# Patient Record
Sex: Female | Born: 1945
Health system: Southern US, Community
[De-identification: ages and names within clinical notes are randomized; demographics above are authoritative.]

## PROBLEM LIST (undated history)

## (undated) DIAGNOSIS — I10 Essential (primary) hypertension: Secondary | ICD-10-CM

## (undated) DIAGNOSIS — M542 Cervicalgia: Secondary | ICD-10-CM

## (undated) DIAGNOSIS — M1990 Unspecified osteoarthritis, unspecified site: Secondary | ICD-10-CM

## (undated) DIAGNOSIS — F419 Anxiety disorder, unspecified: Secondary | ICD-10-CM

## (undated) DIAGNOSIS — K5790 Diverticulosis of intestine, part unspecified, without perforation or abscess without bleeding: Secondary | ICD-10-CM

## (undated) DIAGNOSIS — R51 Headache: Secondary | ICD-10-CM

## (undated) DIAGNOSIS — I6529 Occlusion and stenosis of unspecified carotid artery: Secondary | ICD-10-CM

## (undated) DIAGNOSIS — R519 Headache, unspecified: Secondary | ICD-10-CM

## (undated) DIAGNOSIS — M5090 Cervical disc disorder, unspecified, unspecified cervical region: Secondary | ICD-10-CM

## (undated) DIAGNOSIS — F32A Depression, unspecified: Secondary | ICD-10-CM

## (undated) DIAGNOSIS — K5792 Diverticulitis of intestine, part unspecified, without perforation or abscess without bleeding: Secondary | ICD-10-CM

## (undated) DIAGNOSIS — E039 Hypothyroidism, unspecified: Secondary | ICD-10-CM

## (undated) DIAGNOSIS — R7302 Impaired glucose tolerance (oral): Secondary | ICD-10-CM

## (undated) DIAGNOSIS — R7303 Prediabetes: Secondary | ICD-10-CM

## (undated) DIAGNOSIS — L409 Psoriasis, unspecified: Secondary | ICD-10-CM

## (undated) DIAGNOSIS — F329 Major depressive disorder, single episode, unspecified: Secondary | ICD-10-CM

## (undated) DIAGNOSIS — K219 Gastro-esophageal reflux disease without esophagitis: Secondary | ICD-10-CM

## (undated) HISTORY — PX: CHOLECYSTECTOMY: SHX55

## (undated) HISTORY — DX: Occlusion and stenosis of unspecified carotid artery: I65.29

## (undated) HISTORY — DX: Diverticulosis of intestine, part unspecified, without perforation or abscess without bleeding: K57.90

## (undated) HISTORY — DX: Headache: R51

## (undated) HISTORY — DX: Hypothyroidism, unspecified: E03.9

## (undated) HISTORY — DX: Cervical disc disorder, unspecified, unspecified cervical region: M50.90

## (undated) HISTORY — PX: COLONOSCOPY: SHX174

## (undated) HISTORY — DX: Headache, unspecified: R51.9

## (undated) HISTORY — PX: UPPER GASTROINTESTINAL ENDOSCOPY: SHX188

## (undated) HISTORY — DX: Diverticulitis of intestine, part unspecified, without perforation or abscess without bleeding: K57.92

## (undated) HISTORY — DX: Anxiety disorder, unspecified: F41.9

## (undated) HISTORY — PX: CERVICAL FUSION: SHX112

## (undated) HISTORY — DX: Cervicalgia: M54.2

## (undated) HISTORY — DX: Psoriasis, unspecified: L40.9

## (undated) HISTORY — DX: Impaired glucose tolerance (oral): R73.02

---

## 1996-05-22 HISTORY — PX: TOTAL ABDOMINAL HYSTERECTOMY: SHX209

## 1998-04-23 ENCOUNTER — Emergency Department (HOSPITAL_COMMUNITY): Admission: EM | Admit: 1998-04-23 | Discharge: 1998-04-23 | Payer: Self-pay | Admitting: Emergency Medicine

## 1999-08-17 ENCOUNTER — Encounter: Payer: Self-pay | Admitting: Obstetrics and Gynecology

## 1999-08-17 ENCOUNTER — Encounter: Admission: RE | Admit: 1999-08-17 | Discharge: 1999-08-17 | Payer: Self-pay | Admitting: Obstetrics and Gynecology

## 1999-10-24 ENCOUNTER — Other Ambulatory Visit: Admission: RE | Admit: 1999-10-24 | Discharge: 1999-10-24 | Payer: Self-pay | Admitting: Obstetrics and Gynecology

## 2000-09-01 ENCOUNTER — Encounter: Payer: Self-pay | Admitting: Emergency Medicine

## 2000-09-01 ENCOUNTER — Emergency Department (HOSPITAL_COMMUNITY): Admission: EM | Admit: 2000-09-01 | Discharge: 2000-09-01 | Payer: Self-pay | Admitting: Emergency Medicine

## 2000-09-04 ENCOUNTER — Encounter (INDEPENDENT_AMBULATORY_CARE_PROVIDER_SITE_OTHER): Payer: Self-pay

## 2000-09-04 ENCOUNTER — Observation Stay (HOSPITAL_COMMUNITY): Admission: RE | Admit: 2000-09-04 | Discharge: 2000-09-04 | Payer: Self-pay | Admitting: General Surgery

## 2000-09-04 ENCOUNTER — Encounter: Payer: Self-pay | Admitting: General Surgery

## 2001-04-21 ENCOUNTER — Emergency Department (HOSPITAL_COMMUNITY): Admission: EM | Admit: 2001-04-21 | Discharge: 2001-04-21 | Payer: Self-pay | Admitting: Emergency Medicine

## 2001-04-22 ENCOUNTER — Encounter: Payer: Self-pay | Admitting: Emergency Medicine

## 2002-01-25 ENCOUNTER — Encounter: Payer: Self-pay | Admitting: Emergency Medicine

## 2002-01-25 ENCOUNTER — Emergency Department (HOSPITAL_COMMUNITY): Admission: EM | Admit: 2002-01-25 | Discharge: 2002-01-25 | Payer: Self-pay | Admitting: Emergency Medicine

## 2002-01-30 ENCOUNTER — Emergency Department (HOSPITAL_COMMUNITY): Admission: EM | Admit: 2002-01-30 | Discharge: 2002-01-30 | Payer: Self-pay | Admitting: Emergency Medicine

## 2002-03-24 ENCOUNTER — Encounter: Admission: RE | Admit: 2002-03-24 | Discharge: 2002-03-24 | Payer: Self-pay | Admitting: Obstetrics and Gynecology

## 2002-03-24 ENCOUNTER — Encounter: Payer: Self-pay | Admitting: Obstetrics and Gynecology

## 2002-08-06 ENCOUNTER — Ambulatory Visit (HOSPITAL_COMMUNITY): Admission: RE | Admit: 2002-08-06 | Discharge: 2002-08-06 | Payer: Self-pay | Admitting: Internal Medicine

## 2003-08-04 ENCOUNTER — Encounter: Admission: RE | Admit: 2003-08-04 | Discharge: 2003-08-04 | Payer: Self-pay | Admitting: Obstetrics and Gynecology

## 2005-01-22 ENCOUNTER — Emergency Department (HOSPITAL_COMMUNITY): Admission: EM | Admit: 2005-01-22 | Discharge: 2005-01-23 | Payer: Self-pay | Admitting: Emergency Medicine

## 2005-03-22 ENCOUNTER — Encounter: Admission: RE | Admit: 2005-03-22 | Discharge: 2005-03-22 | Payer: Self-pay | Admitting: Obstetrics and Gynecology

## 2006-03-27 ENCOUNTER — Encounter: Admission: RE | Admit: 2006-03-27 | Discharge: 2006-03-27 | Payer: Self-pay | Admitting: Obstetrics and Gynecology

## 2006-04-13 ENCOUNTER — Emergency Department (HOSPITAL_COMMUNITY): Admission: EM | Admit: 2006-04-13 | Discharge: 2006-04-13 | Payer: Self-pay | Admitting: Emergency Medicine

## 2006-04-17 ENCOUNTER — Encounter: Admission: RE | Admit: 2006-04-17 | Discharge: 2006-04-17 | Payer: Self-pay | Admitting: Obstetrics and Gynecology

## 2006-10-19 ENCOUNTER — Encounter: Admission: RE | Admit: 2006-10-19 | Discharge: 2006-10-19 | Payer: Self-pay | Admitting: Obstetrics and Gynecology

## 2007-04-22 ENCOUNTER — Encounter: Admission: RE | Admit: 2007-04-22 | Discharge: 2007-04-22 | Payer: Self-pay | Admitting: Obstetrics and Gynecology

## 2007-11-21 ENCOUNTER — Other Ambulatory Visit: Admission: RE | Admit: 2007-11-21 | Discharge: 2007-11-21 | Payer: Self-pay | Admitting: Obstetrics & Gynecology

## 2007-11-26 ENCOUNTER — Encounter: Admission: RE | Admit: 2007-11-26 | Discharge: 2007-11-26 | Payer: Self-pay | Admitting: Obstetrics and Gynecology

## 2008-05-18 ENCOUNTER — Encounter: Admission: RE | Admit: 2008-05-18 | Discharge: 2008-05-18 | Payer: Self-pay | Admitting: Obstetrics and Gynecology

## 2008-12-08 ENCOUNTER — Encounter: Admission: RE | Admit: 2008-12-08 | Discharge: 2008-12-08 | Payer: Self-pay | Admitting: Obstetrics and Gynecology

## 2009-05-22 HISTORY — PX: BREAST BIOPSY: SHX20

## 2009-07-29 ENCOUNTER — Encounter (INDEPENDENT_AMBULATORY_CARE_PROVIDER_SITE_OTHER): Payer: Self-pay | Admitting: *Deleted

## 2009-08-10 ENCOUNTER — Encounter (INDEPENDENT_AMBULATORY_CARE_PROVIDER_SITE_OTHER): Payer: Self-pay | Admitting: *Deleted

## 2009-08-11 ENCOUNTER — Ambulatory Visit: Payer: Self-pay | Admitting: Internal Medicine

## 2009-08-20 ENCOUNTER — Ambulatory Visit: Payer: Self-pay | Admitting: Internal Medicine

## 2009-10-07 ENCOUNTER — Emergency Department (HOSPITAL_COMMUNITY): Admission: EM | Admit: 2009-10-07 | Discharge: 2009-10-07 | Payer: Self-pay | Admitting: Family Medicine

## 2009-12-15 ENCOUNTER — Encounter: Admission: RE | Admit: 2009-12-15 | Discharge: 2009-12-15 | Payer: Self-pay | Admitting: Obstetrics and Gynecology

## 2009-12-20 ENCOUNTER — Encounter: Admission: RE | Admit: 2009-12-20 | Discharge: 2009-12-20 | Payer: Self-pay | Admitting: Obstetrics and Gynecology

## 2010-06-12 ENCOUNTER — Encounter: Payer: Self-pay | Admitting: Obstetrics and Gynecology

## 2010-06-23 NOTE — Procedures (Signed)
Summary: Colonoscopy  Patient: Lauren [name] Note: All result statuses are Final unless otherwise noted.  Tests: (1) Colonoscopy (COL)   COL Colonoscopy           DONE     [facility]     [address]     [place], [place]  [address]           COLONOSCOPY PROCEDURE REPORT           PATIENT:  [name], [name]  MR#:  [id number]     BIRTHDATE:  1945/08/28, 63 yrs. old  GENDER:  female     ENDOSCOPIST:  Hedwig Morton. Juanda Chance, MD     REF. BY:  Meredeth Ide, M.D.     PROCEDURE DATE:  08/20/2009     PROCEDURE:  Colonoscopy 29562     ASA CLASS:  Class I     INDICATIONS:  father with colon polyps, prior colon 2001 and 2004,     diverticulosis     MEDICATIONS:   Versed 10 mg, Fentanyl 50 mcg           DESCRIPTION OF PROCEDURE:   After the risks benefits and     alternatives of the procedure were thoroughly explained, informed     consent was obtained.  Digital rectal exam was performed and     revealed no rectal masses.   The LB PCF-H180AL [id number] endoscope     was introduced through the anus and advanced to the cecum, which     was identified by both the appendix and ileocecal valve, without     limitations.  The quality of the prep was good, using MoviPrep.     The instrument was then slowly withdrawn as the colon was fully     examined.     <<PROCEDUREIMAGES>>           FINDINGS:  Severe diverticulosis was found (see image6, image7,     and image3). narrow spastic sigmoid colon with many diverticuli,     had to switch to a pediatric scope  This was otherwise a normal     examination of the colon (see image2, image1, image5, image4, and     image8).   Retroflexed views in the rectum revealed no     abnormalities.    The scope was then withdrawn from the patient     and the procedure completed.           COMPLICATIONS:  None     ENDOSCOPIC IMPRESSION:     1) Severe diverticulosis     2) Otherwise normal examination     IV infiltrated, total sedation given Versed 17mg  and  fentanyl     125 ug     RECOMMENDATIONS:     1) high fiber diet     fiber supplement daily     REPEAT EXAM:  In 10 year(s) for.           ______________________________     Hedwig Morton. Juanda Chance, MD           CC:           n.     eSIGNED:   Hedwig Morton. Rohin Krejci at 08/20/2009 04:47 PM           Isidor Holts, [id number]  Note: An exclamation mark (!) indicates a result that was not dispersed into the flowsheet. Document Creation Date: 08/20/2009 4:48 PM _______________________________________________________________________  (1) Order result status: Final Collection or observation date-time: 08/20/2009 16:38  Requested date-time:  Receipt date-time:  Reported date-time:  Referring Physician:   Ordering Physician: Lina Sar [phone]) Specimen Source:  Source: Launa Grill Order Number: [id number] Lab site:   Appended Document: Colonoscopy    Clinical Lists Changes  Observations: Added new observation of COLONNXTDUE: 08/2019 (08/20/2009 16:50)

## 2010-06-23 NOTE — Letter (Signed)
Summary: Previsit letter  [facility]  [address] [place], [place] [address]   Phone: [phone]  Fax: [phone]       07/29/2009 MRN: [id number]  [facility] [address] [place], [place]  [address]  Dear Ms. Schreckengost,  Welcome to the Gastroenterology Division at [facility].    You are scheduled to see a nurse for your pre-procedure visit on 08/11/2009 at 4:00PM on the 3rd floor at [facility], [address]  We ask that you try to arrive at our office 15 minutes prior to your appointment time to allow for check-in.  Your nurse visit will consist of discussing your medical and surgical history, your immediate family medical history, and your medications.    Please bring a complete list of all your medications or, if you prefer, bring the medication bottles and we will list them.  We will need to be aware of both prescribed and over the counter drugs.  We will need to know exact dosage information as well.  If you are on blood thinners (Coumadin, Plavix, Aggrenox, Ticlid, etc.) please call our office today/prior to your appointment, as we need to consult with your physician about holding your medication.   Please be prepared to read and sign documents such as consent forms, a financial agreement, and acknowledgement forms.  If necessary, and with your consent, a friend or relative is welcome to sit-in on the nurse visit with you.  Please bring your insurance card so that we may make a copy of it.  If your insurance requires a referral to see a specialist, please bring your referral form from your primary care physician.  No co-pay is required for this nurse visit.     If you cannot keep your appointment, please call [phone] to cancel or reschedule prior to your appointment date.  This allows [place] the opportunity to schedule an appointment for another patient in need of care.    Thank you for choosing [facility] for your medical  needs.  We appreciate the opportunity to care for you.  Please visit [place] at our website  to learn more about our practice.                     Sincerely.                                                                                                                   The Gastroenterology Division

## 2010-06-23 NOTE — Procedures (Signed)
Summary: Colonoscopy  Patient: Lauren [name] Note: All result statuses are Final unless otherwise noted.  Tests: (1) Colonoscopy (COL)   COL Colonoscopy           DONE (C)     [facility]     [address]     [place], [place]  [address]           COLONOSCOPY PROCEDURE REPORT           PATIENT:  Lauren [name], Lauren [name]  MR#:  [id number]     BIRTHDATE:  [birth year], 63 yrs. old  GENDER:  female     ENDOSCOPIST:  Hedwig Morton. Juanda Chance, MD     REF. BY:  Meredeth Ide, M.D.     PROCEDURE DATE:  08/20/2009     PROCEDURE:  Colonoscopy 19147     ASA CLASS:  Class I     INDICATIONS:  father with colon polyps, prior colon 2001 and 2004,     diverticulosis     MEDICATIONS:   Versed 17 mg, Fentanyl 125 mcg. Initial doses of     fentanyl and versed 5mg  leaked out around iv. No affects of     meds were noted. Additional meds given after new iv inserted.           DESCRIPTION OF PROCEDURE:   After the risks benefits and     alternatives of the procedure were thoroughly explained, informed     consent was obtained.  Digital rectal exam was performed and     revealed no rectal masses.   The LB PCF-H180AL [id number] endoscope     was introduced through the anus and advanced to the cecum, which     was identified by both the appendix and ileocecal valve, without     limitations.  The quality of the prep was good, using MoviPrep.     The instrument was then slowly withdrawn as the colon was fully     examined.     <<PROCEDUREIMAGES>>           FINDINGS:  Severe diverticulosis was found (see image6, image7,     and image3). narrow spastic sigmoid colon with many diverticuli,     had to switch to a pediatric scope  This was otherwise a normal     examination of the colon (see image2, image1, image5, image4, and     image8).   Retroflexed views in the rectum revealed no     abnormalities.    The scope was then withdrawn from the patient     and the procedure completed.           COMPLICATIONS:   None     ENDOSCOPIC IMPRESSION:     1) Severe diverticulosis     2) Otherwise normal examination     IV infiltrated, total sedation given Versed 17mg  and fentanyl     125 ug     RECOMMENDATIONS:     1) high fiber diet     fiber supplement daily     REPEAT EXAM:  In 10 year(s) for.           ______________________________     Hedwig Morton. Juanda Chance, MD           CC:           n.     REVISED:  08/31/2009 01:17 PM     eSIGNED:   Hedwig Morton. Shenequa Howse at 08/31/2009 01:17 PM  Lauren [name], Lauren [name], [id number]  Note: An exclamation mark (!) indicates a result that was not dispersed into the flowsheet. Document Creation Date: 08/31/2009 1:18 PM _______________________________________________________________________  (1) Order result status: Final Collection or observation date-time: 08/20/2009 16:38 Requested date-time:  Receipt date-time:  Reported date-time:  Referring Physician:   Ordering Physician: Lina Sar [id number]) Specimen Source:  Source: Launa Grill Order Number: [id number] Lab site:

## 2010-06-23 NOTE — Letter (Signed)
Summary: [facility] Instructions  [facility]  [address] [place], [place] [address]   Phone: [phone]  Fax: [phone]       Lauren [name]    10-Apr-1946    MRN: [id number]        Procedure Day /Date:  Friday  08/20/09     Arrival Time:  2:30pm     Procedure Time:  3:30pm     Location of Procedure:                    _X _  [facility] (4th Floor)                        PREPARATION FOR COLONOSCOPY WITH MOVIPREP   Starting 5 days prior to your procedure  Sunday 03/27  do not eat nuts, seeds, popcorn, corn, beans, peas,  salads, or any raw vegetables.  Do not take any fiber supplements (e.g. Metamucil, Citrucel, and Benefiber).  THE DAY BEFORE YOUR PROCEDURE         DATE:  03/31   DAY: Thursday  1.  Drink clear liquids the entire day-NO SOLID FOOD  2.  Do not drink anything colored red or purple.  Avoid juices with pulp.  No orange juice.  3.  Drink at least 64 oz. (8 glasses) of fluid/clear liquids during the day to prevent dehydration and help the prep work efficiently.  CLEAR LIQUIDS INCLUDE: Water Jello Ice Popsicles Tea (sugar ok, no milk/cream) Powdered fruit flavored drinks Coffee (sugar ok, no milk/cream) Gatorade Juice: apple, white grape, white cranberry  Lemonade Clear bullion, consomm, broth Carbonated beverages (any kind) Strained chicken noodle soup Hard Candy                             4.  In the morning, mix first dose of MoviPrep solution:    Empty 1 Pouch A and 1 Pouch B into the disposable container    Add lukewarm drinking water to the top line of the container. Mix to dissolve    Refrigerate (mixed solution should be used within 24 hrs)  5.  Begin drinking the prep at 5:00 p.m. The MoviPrep container is divided by 4 marks.   Every 15 minutes drink the solution down to the next mark (approximately 8 oz) until the full liter is complete.   6.  Follow completed prep with 16 oz of clear liquid of your  choice (Nothing red or purple).  Continue to drink clear liquids until bedtime.  7.  Before going to bed, mix second dose of MoviPrep solution:    Empty 1 Pouch A and 1 Pouch B into the disposable container    Add lukewarm drinking water to the top line of the container. Mix to dissolve    Refrigerate  THE DAY OF YOUR PROCEDURE      DATE:  04/01  DAY: Friday  Beginning at  10:30 a.m. (5 hours before procedure):         1. Every 15 minutes, drink the solution down to the next mark (approx 8 oz) until the full liter is complete.  2. Follow completed prep with 16 oz. of clear liquid of your choice.    3. You may drink clear liquids until 1:30pm (2 HOURS BEFORE PROCEDURE).   MEDICATION INSTRUCTIONS  Unless otherwise instructed, you should take regular prescription medications with a small sip of water  as early as possible the morning of your procedure.           OTHER INSTRUCTIONS  You will need a responsible adult at least 66 years of age to accompany you and drive you home.   This person must remain in the waiting room during your procedure.  Wear loose fitting clothing that is easily removed.  Leave jewelry and other valuables at home.  However, you may wish to bring a book to read or  an iPod/MP3 player to listen to music as you wait for your procedure to start.  Remove all body piercing jewelry and leave at home.  Total time from sign-in until discharge is approximately 2-3 hours.  You should go home directly after your procedure and rest.  You can resume normal activities the  day after your procedure.  The day of your procedure you should not:   Drive   Make legal decisions   Operate machinery   Drink alcohol   Return to work  You will receive specific instructions about eating, activities and medications before you leave.    The above instructions have been reviewed and explained to me by   Ezra Sites RN  August 11, 2009 3:16 PM     I fully  understand and can verbalize these instructions _____________________________ Date _________

## 2010-06-23 NOTE — Miscellaneous (Signed)
Summary: [facility]  Clinical Lists Changes  Medications: Added new medication of MOVIPREP 100 GM  SOLR (PEG-KCL-NACL-NASULF-NA ASC-C) As per prep instructions. - Signed Rx of MOVIPREP 100 GM  SOLR (PEG-KCL-NACL-NASULF-NA ASC-C) As per prep instructions.;  #1 x 0;  Signed;  Entered by: Ezra Sites RN;  Authorized by: Hart Carwin MD;  Method used: Electronically to [facility]  [phone]*, [address], [place], [place]  [address], Ph: [phone] or [phone], Fax: [phone]    Prescriptions: MOVIPREP 100 GM  SOLR (PEG-KCL-NACL-NASULF-NA ASC-C) As per prep instructions.  #1 x 0   Entered by:   Ezra Sites RN   Authorized by:   Hart Carwin MD   Signed by:   Ezra Sites RN on 08/11/2009   Method used:   Electronically to        [facility]  [phone]* (retail)       [address] [place], [place]  [address]       Ph: [phone] or [phone]       Fax: [phone]   RxID:   [id number]

## 2010-08-20 ENCOUNTER — Emergency Department (HOSPITAL_COMMUNITY)
Admission: EM | Admit: 2010-08-20 | Discharge: 2010-08-20 | Disposition: A | Discharge status: Home / Self Care | Payer: BC Managed Care – PPO | Attending: Emergency Medicine | Admitting: Emergency Medicine

## 2010-08-20 ENCOUNTER — Emergency Department (HOSPITAL_COMMUNITY): Payer: BC Managed Care – PPO

## 2010-08-20 DIAGNOSIS — H5710 Ocular pain, unspecified eye: Secondary | ICD-10-CM | POA: Insufficient documentation

## 2010-08-20 DIAGNOSIS — H53149 Visual discomfort, unspecified: Secondary | ICD-10-CM | POA: Insufficient documentation

## 2010-08-20 DIAGNOSIS — R51 Headache: Secondary | ICD-10-CM | POA: Insufficient documentation

## 2010-10-07 NOTE — Op Note (Signed)
[facility]  Patient:    Lauren [name], Lauren [name]                     MRN: [id number] Proc. Date: 09/04/00 Adm. Date:  [id number] Disc. Date: [id number] Attending:  Henrene Dodge CC:         Darden Palmer., M.D.   Operative Report  PREOPERATIVE DIAGNOSIS:  Chronic cholecystitis.  POSTOPERATIVE DIAGNOSIS: Chronic cholecystitis.  OPERATION:  Laparoscopic cholecystectomy with cholangiogram.  SURGEON:  Anselm Pancoast. Zachery Dakins, M.D.  ASSISTANT:  ________.  ANESTHESIA:  General.  INDICATIONS:  The patient is a 65 year old Caucasian female who I saw in the office yesterday morning after she had been seen in the emergency room on Saturday with severe epigastric pain.  She had had a previous workup by Dr. Donnie Aho for indigestion, etc., to rule out cardiac problems after her mother had cardiac problems approximately six months ago with no abnormalities noted.  On Saturday morning, she had the onset of severe pain in the epigastric area, and presented to the emergency room later in the day.  Lab studies were obtained and then they recommended an ultrasound of the gallbladder which showed numerous small stones with a dilated gallbladder.  She was treated with Percocet and given our name, and I saw her in the office yesterday morning as an urgent patient.  On examination at that time, she was definitely somewhat better, and still complaining of some discomfort, and I recommended proceeding with a laparoscopic cholecystectomy with cholangiogram.  DESCRIPTION OF PROCEDURE:  The patient preoperative was given 3 g of Ancef and she was placed in PAS stockings, induction of general anesthesia, prepped with Betadine surgical scrub, and draped in a sterile manner.  A small incision below the umbilicus made.  The fascia was picked up between [facility].  She had a small fascial defect at the umbilicus, but I worked through this, and then the peritoneum was  identified.  Traction sutures were placed and the Hasson cannula introduced.  The gallbladder was distended, but not acutely inflamed. The upper 10 mm trocar was placed in the subxiphoid, the fascia with two lateral 5 mm blunt trocars were placed by Dr. Orpah Greek.  The gallbladder was retracted upward and outward.  The peritoneum was opened.  Proximally, the cystic duct was identified as was the cystic artery.  The cystic artery was doubly clipped proximally, singly distally, and one clip placed at the junction of the gallbladder and the cystic duct, and a Taut catheter introduced.  An x-ray was obtained which showed good prompt filling of the extrahepatic and the intrahepatic duodenum, and also note the main branches of the intrahepatic ducts.  The catheter was removed.  The cystic duct was triply clipped and divided, and then the posterior branch of the cystic artery was doubly clipped and then this divided.  The gallbladder was freed from its bed using the hook electrocautery and good hemostasis.  The gallbladder was then withdrawn through the umbilical fascial defect, removing the bile so it would come up. She has got small stones.  This was washed out and the the fascia was closed with about three 0 Vicryl sutures, kind of incorporating this little umbilical hernia to close it.  The subcutaneous tissue was closed with 4-0 Vicryl while the fascia had been anesthetized with Marcaine and adrenalin, then Benzoin and Steri-Strips on the skin tissue.  The patient tolerated the procedure nicely and was sent to the recovery room in stable postoperative  condition. DD:  09/04/00 TD:  09/05/00 Job: [id number]

## 2010-10-07 NOTE — Op Note (Signed)
   Lauren [name], Lauren [name]                        ACCOUNT NO.:  [id number]   MEDICAL RECORD NO.:  [id number]                   PATIENT TYPE:  AMB   LOCATION:  ENDO                                 FACILITY:  [facility]   PHYSICIAN:  Lina Sar, M.D. [facility]               DATE OF BIRTH:  [birth year]   DATE OF PROCEDURE:  DATE OF DISCHARGE:                                 OPERATIVE REPORT   NAME OF PROCEDURE:  Colonoscopy.   INDICATIONS:  This 65 year old white female has a history of diverticulitis.  She had recent exacerbation of diverticulitis requiring use of antibiotics.  Her last colonoscopy was in March 2001.  She has been complaining of being  bloated.  On examination on 08/01/2002, her stool was strong hemoccult-  positive.  It was unclear whether this was due to ongoing segmental colitis  due to diverticulitis or whether there was any neoplastic lesion.  For that  reason she is undergoing colonoscopy.  She was also found to be quite tender  in the left lower quadrant.   ENDOSCOPE:  Olympus single channel video endoscope.   SEDATION:  Versed 7 mg IV, Demerol 80 mg IV.   FAMILY HISTORY:  Olympus single channel video endoscope passed under direct  view into the rectum to the sigmoid colon.  The patient was monitored by  pulse oximeter.  Oxygen saturations were normal.  Her prep was excellent.  Anal canal and rectal ampullae was unremarkable.  There was severe  diverticulosis of the short segment of the sigmoid colon in the pelvic area  which showed narrowing of the wall with a large, hypertrophic vault and  multiple diverticula.  The mucosa, however, appeared normal.  There was no  bleeding, ulcerations, or erosions to the mucosa.  Once the segment was  traversed, the rest of the colon appeared normal.  The descending colon,  splenic flexure, and transverse colon was unremarkable with no activity in  the mucosa, in the hepatic flexure, right colon, and the cecum.  Cecal pouch  and  ileocecal valve area were normal.  Colonoscope was then retracted and  colon decompressed.  The patient tolerated procedure well.   IMPRESSION:  Moderate to severe diverticulosis of the sigmoid colon with no  evidence of diverticulitis, nothing to explain the hemoccult-positive stool.   PLAN:  1. Metamucil one tablespoon daily.  2. High fiber diet.  3. Levsin sublingually 0.125 mg p.r.n. crampy abdominal pain.                                               Lina Sar, M.D. [facility]    DB/MEDQ  D:  08/06/2002  T:  08/06/2002  Job:  [id number]

## 2010-11-07 ENCOUNTER — Other Ambulatory Visit: Payer: Self-pay | Admitting: Obstetrics and Gynecology

## 2010-11-07 DIAGNOSIS — Z1231 Encounter for screening mammogram for malignant neoplasm of breast: Secondary | ICD-10-CM

## 2010-12-19 ENCOUNTER — Ambulatory Visit
Admission: RE | Admit: 2010-12-19 | Discharge: 2010-12-19 | Disposition: A | Discharge status: Home / Self Care | Payer: BC Managed Care – PPO | Source: Ambulatory Visit | Attending: Obstetrics and Gynecology | Admitting: Obstetrics and Gynecology

## 2010-12-19 DIAGNOSIS — Z1231 Encounter for screening mammogram for malignant neoplasm of breast: Secondary | ICD-10-CM

## 2011-02-07 ENCOUNTER — Inpatient Hospital Stay (INDEPENDENT_AMBULATORY_CARE_PROVIDER_SITE_OTHER)
Admission: RE | Admit: 2011-02-07 | Discharge: 2011-02-07 | Disposition: A | Discharge status: Home / Self Care | Payer: BC Managed Care – PPO | Source: Ambulatory Visit | Attending: Family Medicine | Admitting: Family Medicine

## 2011-02-07 DIAGNOSIS — J40 Bronchitis, not specified as acute or chronic: Secondary | ICD-10-CM

## 2012-01-01 ENCOUNTER — Other Ambulatory Visit: Payer: Self-pay | Admitting: Obstetrics and Gynecology

## 2012-01-01 DIAGNOSIS — Z1231 Encounter for screening mammogram for malignant neoplasm of breast: Secondary | ICD-10-CM

## 2012-01-17 ENCOUNTER — Ambulatory Visit: Payer: BC Managed Care – PPO

## 2012-02-02 ENCOUNTER — Ambulatory Visit: Payer: BC Managed Care – PPO

## 2012-02-02 ENCOUNTER — Ambulatory Visit
Admission: RE | Admit: 2012-02-02 | Discharge: 2012-02-02 | Disposition: A | Discharge status: Home / Self Care | Payer: Medicare Other | Source: Ambulatory Visit | Attending: Obstetrics and Gynecology | Admitting: Obstetrics and Gynecology

## 2012-02-02 DIAGNOSIS — Z1231 Encounter for screening mammogram for malignant neoplasm of breast: Secondary | ICD-10-CM

## 2012-02-20 ENCOUNTER — Other Ambulatory Visit: Payer: Self-pay

## 2012-04-10 ENCOUNTER — Emergency Department (HOSPITAL_COMMUNITY)
Admission: EM | Admit: 2012-04-10 | Discharge: 2012-04-10 | Disposition: A | Discharge status: Home / Self Care | Payer: Medicare Other | Source: Home / Self Care | Attending: Family Medicine | Admitting: Family Medicine

## 2012-04-10 ENCOUNTER — Encounter (HOSPITAL_COMMUNITY): Payer: Self-pay | Admitting: *Deleted

## 2012-04-10 DIAGNOSIS — J329 Chronic sinusitis, unspecified: Secondary | ICD-10-CM

## 2012-04-10 HISTORY — DX: Depression, unspecified: F32.A

## 2012-04-10 HISTORY — DX: Major depressive disorder, single episode, unspecified: F32.9

## 2012-04-10 MED ORDER — PREDNISONE 20 MG PO TABS: ORAL_TABLET | ORAL | Status: DC

## 2012-04-10 MED ORDER — AMOXICILLIN 500 MG PO CAPS: 500.0000 mg | ORAL_CAPSULE | Freq: Three times a day (TID) | ORAL | Status: DC

## 2012-04-10 MED ORDER — HYDROCODONE-ACETAMINOPHEN 5-500 MG PO TABS: 1.0000 | ORAL_TABLET | Freq: Three times a day (TID) | ORAL | Status: DC | PRN

## 2012-04-10 MED ORDER — CETIRIZINE-PSEUDOEPHEDRINE ER 5-120 MG PO TB12: 1.0000 | ORAL_TABLET | Freq: Every day | ORAL | Status: DC

## 2012-04-10 NOTE — ED Notes (Signed)
Pt reports sinus pain and pressure for the past 2 weeks with headache - no relief with motrin - states that this morning she had  Dried blood in both nostrils - encouraged to use saline spray for moisture

## 2012-04-10 NOTE — ED Provider Notes (Signed)
History     CSN: [id number]  Arrival date & time 04/10/12  4098   First MD Initiated Contact with Patient 04/10/12 1022      Chief Complaint  Patient presents with  . Facial Pain    (Consider location/radiation/quality/duration/timing/severity/associated sxs/prior treatment) HPI Comments: 66 year old nonsmoker female with history of depression. Here complaining of nasal congestion, sinus pressure and frontal headache for about 2 weeks. Patient reports to have increased rhinorrhea in the last 2 days and has seen dry blood and crusting around nares today. She also has a blood/metallic taste in the back of her throat. Denies fever or chills. No general malaise. Appetite is at baseline. Denies cough, shortness of breath or wheezing. Not taking any medications for her symptoms.   Past Medical History  Diagnosis Date  . Depression     History reviewed. No pertinent past surgical history.  Family History  Problem Relation Age of Onset  . Cancer Other     History  Substance Use Topics  . Smoking status: Never Smoker   . Smokeless tobacco: Not on file  . Alcohol Use: Yes     Comment: social    OB History    Grav Para Term Preterm Abortions TAB SAB Ect Mult Living                  Review of Systems  Constitutional: Negative for fever, chills, diaphoresis and appetite change.  HENT: Positive for congestion, rhinorrhea and postnasal drip. Negative for sore throat, facial swelling, sneezing, trouble swallowing and neck pain.   Eyes: Negative for visual disturbance.  Gastrointestinal: Negative for nausea, vomiting and abdominal pain.  Musculoskeletal: Negative for myalgias and arthralgias.  Skin: Negative for rash.  Neurological: Positive for headaches. Negative for dizziness.  All other systems reviewed and are negative.    Allergies  Celebrex  Home Medications   Current Outpatient Rx  Name  Route  Sig  Dispense  Refill  . ESCITALOPRAM OXALATE 5 MG PO TABS   Oral   Take 5 mg by mouth daily.         . AMOXICILLIN 500 MG PO CAPS   Oral   Take 1 capsule (500 mg total) by mouth 3 (three) times daily.   21 capsule   0   . CETIRIZINE-PSEUDOEPHEDRINE ER 5-120 MG PO TB12   Oral   Take 1 tablet by mouth daily.   5 tablet   0   . HYDROCODONE-ACETAMINOPHEN 5-500 MG PO TABS   Oral   Take 1 tablet by mouth every 8 (eight) hours as needed for pain.   10 tablet   0   . PREDNISONE 20 MG PO TABS      2 tablets by mouth daily for 5 days   10 tablet   0     BP 133/57  Pulse 63  Temp 97.9 F (36.6 C) (Oral)  Resp 17  SpO2 100%  Physical Exam  Nursing note and vitals reviewed. Constitutional: She is oriented to person, place, and time. She appears well-developed and well-nourished. No distress.  HENT:  Head: Normocephalic and atraumatic.  Right Ear: External ear normal.  Left Ear: External ear normal.       Nasal Congestion with erythema and swelling of nasal turbinates, white scant rhinorrhea. Sinus tenderness with palpation in maxillary area bilaterally. Postnasal drip, pharyngeal erythema no exudates. No uvula deviation. No trismus. TM's normal.   Neck: Normal range of motion. Neck supple.  Cardiovascular: Normal rate, regular rhythm and  normal heart sounds.   Pulmonary/Chest: Effort normal and breath sounds normal. No respiratory distress. She has no wheezes. She has no rales. She exhibits no tenderness.  Lymphadenopathy:    She has no cervical adenopathy.  Neurological: She is alert and oriented to person, place, and time.  Skin: No rash noted. She is not diaphoretic.    ED Course  Procedures (including critical care time)  Labs Reviewed - No data to display No results found.   1. Sinusitis       MDM  Treated with amoxicillin, prednisone, Vicodin and Zyrtec-D for 5 days. It was discussed with patient possible side effects of Vicodin including drowsiness and increased risk for falls as well as possible side effects with  Zyrtec D, including urinary retention/incontinence and increase of blood pressure. Recommended to use short-term when necessary. Supportive care red flags that should prompt her return to medical attention discussed with patient and provided in writing. Asked to followup with her primary care provider or ENT specialist if not improving or worsening symptoms.       Sharin Grave, MD 04/11/12 [id number]

## 2012-12-30 ENCOUNTER — Other Ambulatory Visit: Payer: Self-pay

## 2012-12-30 DIAGNOSIS — Z1231 Encounter for screening mammogram for malignant neoplasm of breast: Secondary | ICD-10-CM

## 2013-02-03 ENCOUNTER — Ambulatory Visit
Admission: RE | Admit: 2013-02-03 | Discharge: 2013-02-03 | Disposition: A | Discharge status: Home / Self Care | Payer: Medicare Other | Source: Ambulatory Visit

## 2013-02-03 DIAGNOSIS — Z1231 Encounter for screening mammogram for malignant neoplasm of breast: Secondary | ICD-10-CM

## 2013-02-04 ENCOUNTER — Other Ambulatory Visit: Payer: Self-pay | Admitting: Gynecology

## 2013-02-04 DIAGNOSIS — R928 Other abnormal and inconclusive findings on diagnostic imaging of breast: Secondary | ICD-10-CM

## 2013-02-06 ENCOUNTER — Ambulatory Visit
Admission: RE | Admit: 2013-02-06 | Discharge: 2013-02-06 | Disposition: A | Discharge status: Home / Self Care | Payer: Medicare Other | Source: Ambulatory Visit | Attending: Gynecology | Admitting: Gynecology

## 2013-02-06 DIAGNOSIS — R928 Other abnormal and inconclusive findings on diagnostic imaging of breast: Secondary | ICD-10-CM

## 2013-02-06 LAB — HM MAMMOGRAPHY

## 2013-05-07 ENCOUNTER — Telehealth: Payer: Self-pay | Admitting: Internal Medicine

## 2013-05-07 ENCOUNTER — Ambulatory Visit: Payer: Medicare Other | Admitting: Nurse Practitioner

## 2013-05-07 NOTE — Telephone Encounter (Signed)
OK to see Gunnar Fusi.

## 2013-05-07 NOTE — Telephone Encounter (Signed)
Patient states she had some left sided abdominal pain yesterday. She has had some cramping and diarrhea today. She reports anal itching for some time now. Scheduled patient with Willette Cluster, NP tomorrow at 2:00 PM.

## 2013-05-07 NOTE — Telephone Encounter (Signed)
Left a message for patient to call me. 

## 2013-05-08 ENCOUNTER — Encounter: Payer: Self-pay | Admitting: Nurse Practitioner

## 2013-05-08 ENCOUNTER — Ambulatory Visit (INDEPENDENT_AMBULATORY_CARE_PROVIDER_SITE_OTHER): Payer: Medicare Other | Admitting: Nurse Practitioner

## 2013-05-08 VITALS — BP 126/70 | HR 76 | Ht 64.0 in | Wt 146.4 lb

## 2013-05-08 DIAGNOSIS — K5732 Diverticulitis of large intestine without perforation or abscess without bleeding: Secondary | ICD-10-CM

## 2013-05-08 MED ORDER — CIPROFLOXACIN HCL 250 MG PO TABS: 250.0000 mg | ORAL_TABLET | Freq: Two times a day (BID) | ORAL | Status: DC

## 2013-05-08 MED ORDER — TRAMADOL HCL 50 MG PO TABS: ORAL_TABLET | ORAL | Status: DC

## 2013-05-08 MED ORDER — METRONIDAZOLE 500 MG PO TABS: 500.0000 mg | ORAL_TABLET | Freq: Three times a day (TID) | ORAL | Status: AC

## 2013-05-08 NOTE — Patient Instructions (Signed)
We sent  prescriptions  to [facility]. 1. Cipro 2. Metronidazole 3. Ultram ( Tramadole )   Clear liquid diet for the next few days, gradually advance your diet.  After you finish the antibiotics you can follow a high fiber  diet.   If you get a fever or worsening pain, call our office. After hours we always have a doctor on call.  [phone].  Call us early January for a follow up with Willette Cluster ACNP.

## 2013-05-10 ENCOUNTER — Telehealth: Payer: Self-pay | Admitting: Internal Medicine

## 2013-05-10 ENCOUNTER — Encounter: Payer: Self-pay | Admitting: Nurse Practitioner

## 2013-05-10 DIAGNOSIS — K5732 Diverticulitis of large intestine without perforation or abscess without bleeding: Secondary | ICD-10-CM | POA: Insufficient documentation

## 2013-05-10 NOTE — Progress Notes (Signed)
    HPI :  Patient is a 67 year old female known to Dr. Juanda Chance for a history diverticular disease. She has remote history of diverticulitis. Last colonoscopy in 2011 revealed severe diverticulosis with spastic narrowing of the sigmoid colon. Patient here with LLQ quadrant pain. Pain started a few days ago. It is constant, unrelated to meals. Though patient had loose stool yesterday, overall she has been somewhat constipated lately. She uses Miralax as needed. No fevers. No urinary symptoms. Pain feels like when she was treated for diverticulitis.   Past Medical History  Diagnosis Date  . Depression   . Diverticulitis   . Diverticulosis     Family History  Problem Relation Age of Onset  . Diabetes Brother   . Heart disease Mother   . Lung cancer Father   . Throat cancer Brother    History  Substance Use Topics  . Smoking status: Never Smoker   . Smokeless tobacco: Never Used  . Alcohol Use: Yes     Comment: social   Current Outpatient Prescriptions  Medication Sig Dispense Refill  . escitalopram (LEXAPRO) 5 MG tablet Take 5 mg by mouth daily.      . ciprofloxacin (CIPRO) 250 MG tablet Take 1 tablet (250 mg total) by mouth 2 (two) times daily.  20 tablet  0  . metroNIDAZOLE (FLAGYL) 500 MG tablet Take 1 tablet (500 mg total) by mouth 3 (three) times daily.  30 tablet  0  . traMADol (ULTRAM) 50 MG tablet Take 1 tab every 6 hours as needed for pain.  30 tablet  0   No current facility-administered medications for this visit.   Allergies  Allergen Reactions  . Celebrex [Celecoxib]     Review of Systems: Positive for headaches. All other systems reviewed and negative except where noted in HPI.    Physical Exam: BP 126/70  Pulse 76  Ht 5\' 4"  (1.626 m)  Wt 146 lb 6 oz (66.395 kg)  BMI 25.11 kg/m2 Constitutional: Pleasant,well-developed, white female in no acute distress. HEENT: Normocephalic and atraumatic. Conjunctivae are normal. No scleral icterus. Neck supple.    Cardiovascular: Normal rate, regular rhythm.  Pulmonary/chest: Effort normal and breath sounds normal. No wheezing, rales or rhonchi. Abdominal: Soft, nondistended, moderate LLQ tenderness. Bowel sounds active throughout. There are no masses palpable. No hepatomegaly. Extremities: no edema Lymphadenopathy: No cervical adenopathy noted. Neurological: Alert and oriented to person place and time. Skin: Skin is warm and dry. No rashes noted. Psychiatric: Normal mood and affect. Behavior is normal.   ASSESSMENT AND PLAN:   Acute LLQ pain, suspect acute diverticulitis. Will treat with 10 day course of Cipro/flagyl. Trial of ultram for pain. Clear liquids for next couple of days then advance to low residue diet. After completion of antibiotics she will advance to high fiber diet. Patient will follow up in clinic in 2-3 weeks. She knows to call in the interim for worsening pain and / or fevers.

## 2013-05-10 NOTE — Telephone Encounter (Signed)
Pt called after eating bologna and egg sandwich (1st solid food in several day) and now with lower abd cramping pain No fevers, chills, nausea, or vomiting No blood in stools Being treated for diverticulitis with cipro/metronidazole   I recommended more bland diet, small freq meals, BRAT diet Continue current abx Call if worsening or go to the ED She voiced understanding and thanked me for the call

## 2013-05-11 NOTE — Progress Notes (Signed)
Reviewed and agree with Cipro/Flagyl and follow up

## 2013-05-16 ENCOUNTER — Emergency Department (HOSPITAL_COMMUNITY): Payer: Medicare Other

## 2013-05-16 ENCOUNTER — Emergency Department (HOSPITAL_COMMUNITY)
Admission: EM | Admit: 2013-05-16 | Discharge: 2013-05-16 | Disposition: A | Discharge status: Home / Self Care | Payer: Medicare Other | Attending: Emergency Medicine | Admitting: Emergency Medicine

## 2013-05-16 ENCOUNTER — Encounter (HOSPITAL_COMMUNITY): Payer: Self-pay | Admitting: Emergency Medicine

## 2013-05-16 DIAGNOSIS — Z90710 Acquired absence of both cervix and uterus: Secondary | ICD-10-CM | POA: Insufficient documentation

## 2013-05-16 DIAGNOSIS — R109 Unspecified abdominal pain: Secondary | ICD-10-CM

## 2013-05-16 DIAGNOSIS — Z9089 Acquired absence of other organs: Secondary | ICD-10-CM | POA: Insufficient documentation

## 2013-05-16 DIAGNOSIS — Z792 Long term (current) use of antibiotics: Secondary | ICD-10-CM | POA: Insufficient documentation

## 2013-05-16 DIAGNOSIS — K5792 Diverticulitis of intestine, part unspecified, without perforation or abscess without bleeding: Secondary | ICD-10-CM

## 2013-05-16 DIAGNOSIS — F329 Major depressive disorder, single episode, unspecified: Secondary | ICD-10-CM | POA: Insufficient documentation

## 2013-05-16 DIAGNOSIS — F3289 Other specified depressive episodes: Secondary | ICD-10-CM | POA: Insufficient documentation

## 2013-05-16 DIAGNOSIS — K5732 Diverticulitis of large intestine without perforation or abscess without bleeding: Secondary | ICD-10-CM | POA: Insufficient documentation

## 2013-05-16 DIAGNOSIS — Z79899 Other long term (current) drug therapy: Secondary | ICD-10-CM | POA: Insufficient documentation

## 2013-05-16 LAB — CBC WITH DIFFERENTIAL/PLATELET
Eosinophils Absolute: 0.3 10*3/uL (ref 0.0–0.7)
Eosinophils Relative: 7 % — ABNORMAL HIGH (ref 0–5)
HCT: 36.7 % (ref 36.0–46.0)
Hemoglobin: 12 g/dL (ref 12.0–15.0)
Lymphs Abs: 0.9 10*3/uL (ref 0.7–4.0)
MCH: 31 pg (ref 26.0–34.0)
MCV: 94.8 fL (ref 78.0–100.0)
Monocytes Absolute: 0.4 10*3/uL (ref 0.1–1.0)
Monocytes Relative: 9 % (ref 3–12)
Neutrophils Relative %: 62 % (ref 43–77)
RBC: 3.87 MIL/uL (ref 3.87–5.11)

## 2013-05-16 LAB — COMPREHENSIVE METABOLIC PANEL
Alkaline Phosphatase: 75 U/L (ref 39–117)
BUN: 12 mg/dL (ref 6–23)
Creatinine, Ser: 0.71 mg/dL (ref 0.50–1.10)
GFR calc Af Amer: >90 mL/min (ref >90)
Glucose, Bld: 96 mg/dL (ref 70–99)
Potassium: 3.8 mEq/L (ref 3.5–5.1)
Total Protein: 6.8 g/dL (ref 6.0–8.3)

## 2013-05-16 LAB — URINALYSIS, ROUTINE W REFLEX MICROSCOPIC
Ketones, ur: NEGATIVE mg/dL
Leukocytes, UA: NEGATIVE
Nitrite: NEGATIVE
Protein, ur: NEGATIVE mg/dL
pH: 6.5 (ref 5.0–8.0)

## 2013-05-16 MED ORDER — HYDROCODONE-ACETAMINOPHEN 5-325 MG PO TABS: 1.0000 | ORAL_TABLET | Freq: Four times a day (QID) | ORAL | Status: DC | PRN

## 2013-05-16 MED ORDER — IOHEXOL 300 MG/ML  SOLN
25.0000 mL | INTRAMUSCULAR | Status: AC
  Administered 2013-05-16 (×2): 25 mL via ORAL

## 2013-05-16 MED ORDER — MORPHINE SULFATE 4 MG/ML IJ SOLN
4.0000 mg | Freq: Once | INTRAMUSCULAR | Status: AC
  Administered 2013-05-16: 4 mg via INTRAVENOUS
  Filled 2013-05-16: qty 1

## 2013-05-16 MED ORDER — IOHEXOL 300 MG/ML  SOLN
80.0000 mL | Freq: Once | INTRAMUSCULAR | Status: AC | PRN
  Administered 2013-05-16: 80 mL via INTRAVENOUS

## 2013-05-16 MED ORDER — SODIUM CHLORIDE 0.9 % IV BOLUS (SEPSIS)
1000.0000 mL | Freq: Once | INTRAVENOUS | Status: AC
  Administered 2013-05-16: 1000 mL via INTRAVENOUS

## 2013-05-16 MED ORDER — ONDANSETRON HCL 4 MG/2ML IJ SOLN
4.0000 mg | Freq: Once | INTRAMUSCULAR | Status: AC
  Administered 2013-05-16: 4 mg via INTRAVENOUS
  Filled 2013-05-16: qty 2

## 2013-05-16 NOTE — ED Provider Notes (Signed)
CSN: [id number]     Arrival date & time 05/16/13  1034 History   First MD Initiated Contact with Patient 05/16/13 1207     Chief Complaint  Patient presents with  . Abdominal Pain   (Consider location/radiation/quality/duration/timing/severity/associated sxs/prior Treatment) HPI  This is a 67 year old female sent in by her gastroenterologist for evaluation of diverticulitis.  The patient is followed at [facility].  She sees Dr. Lina Sar.  Patient states she has had worsening left lower quadrant abdominal pain beginning 05/05/2013.  She called into her GI office and was started on Cipro and Flagyl 05/08/2013.  Patient also began on clear liquid diet.  She is taking tramadol for pain.  He has been slowly advancing her diet.  The patient states that she has had worsening severe left lower quadrant pain.  Her pain is uncontrolled at home with tramadol.  She also complains of chills and is soaking night sweats over the past 2 days.  The patient denies nausea, vomiting, diarrhea, hematochezia or melena. Patient denies fevers, unexplained weight loss or fatigue.   Past Medical History  Diagnosis Date  . Depression   . Diverticulitis   . Diverticulosis    Past Surgical History  Procedure Laterality Date  . Cholecystectomy    . Total abdominal hysterectomy    . Cervical fusion     Family History  Problem Relation Age of Onset  . Diabetes Brother   . Heart disease Mother   . Lung cancer Father   . Throat cancer Brother    History  Substance Use Topics  . Smoking status: Never Smoker   . Smokeless tobacco: Never Used  . Alcohol Use: Yes     Comment: social   OB History   Grav Para Term Preterm Abortions TAB SAB Ect Mult Living                 Review of Systems Ten systems reviewed and are negative for acute change, except as noted in the HPI.    Allergies  Celebrex  Home Medications   Current Outpatient Rx  Name  Route  Sig  Dispense  Refill  . ciprofloxacin (CIPRO)  250 MG tablet   Oral   Take 250 mg by mouth 2 (two) times daily.         Marland Kitchen escitalopram (LEXAPRO) 5 MG tablet   Oral   Take 5 mg by mouth daily.         . metroNIDAZOLE (FLAGYL) 500 MG tablet   Oral   Take 1 tablet (500 mg total) by mouth 3 (three) times daily.   30 tablet   0   . traMADol (ULTRAM) 50 MG tablet      Take 1 tab every 6 hours as needed for pain.   30 tablet   0    BP 128/59  Pulse 77  Temp(Src) 98.1 F (36.7 C)  Resp 18  SpO2 98% Physical Exam Physical Exam  Nursing note and vitals reviewed. Constitutional: She is oriented to person, place, and time. She appears well-developed and well-nourished. Tearful, appears to be in pain. HENT:  Head: Normocephalic and atraumatic.  Eyes: Conjunctivae normal and EOM are normal. Pupils are equal, round, and reactive to light. No scleral icterus.  Neck: Normal range of motion.  Cardiovascular: Normal rate, regular rhythm and normal heart sounds.  Exam reveals no gallop and no friction rub.   No murmur heard. Pulmonary/Chest: Effort normal and breath sounds normal. No respiratory distress.  Abdominal:  Quiet abdomen, no distension, guarding, or peritoneal signs.  She is exquisitely tender to palpation of the left lower quadrant. Neurological: She is alert and oriented to person, place, and time.  Skin: Skin is warm and dry. She is not diaphoretic.    ED Course  Procedures (including critical care time) Labs Review Labs Reviewed  CBC WITH DIFFERENTIAL - Abnormal; Notable for the following:    Eosinophils Relative 7 (*)    All other components within normal limits  COMPREHENSIVE METABOLIC PANEL  URINALYSIS, ROUTINE W REFLEX MICROSCOPIC  LIPASE, BLOOD   Imaging Review No results found.  EKG Interpretation   None       MDM   1. Abdominal pain   2. Diverticulitis    4:13 PM Patient CT scan negative for acute abnormality. Pain well controlled here in the ED. Afebrile. No leukocytosis. Marland Kitchen Appears safe  for d/c . Continue abx. Change pain meds to norco. F/u with GI. Return precautions discussed.  Arthor Captain, PA-C 05/16/13 2136

## 2013-05-16 NOTE — ED Provider Notes (Signed)
  Face-to-face evaluation   History: She has had left lower  quadrant abdominal pain for several days.   Physical exam: Alert, calm, cooperative. Abdomen soft, and nontender to palpation  Medical screening examination/treatment/procedure(s) were conducted as a shared visit with non-physician practitioner(s) and myself.  I personally evaluated the patient during the encounter  Flint Melter, MD 05/17/13 1420

## 2013-05-16 NOTE — ED Notes (Signed)
Per pt was seen and treated for diverticulitis 1 week ago and not getting better. sts sent here by her doctor for CT scan

## 2013-05-16 NOTE — ED Notes (Signed)
Pt c/o LLQ abdominal pain x1 week. States history of divertulitis. Also states loose stools and decreased appetite. 7/10 pain at the time. Pt is alert and oriented x4. No signs of distress noted at present.

## 2013-05-16 NOTE — Telephone Encounter (Signed)
Still complaining of left lower quadrant abdominal pain.  No fever or chills.  Taken antibiotics.  Instructed to go to the ED her evaluation.  She'll require a CT scan for further evaluation.

## 2013-05-23 ENCOUNTER — Telehealth: Payer: Self-pay | Admitting: Nurse Practitioner

## 2013-05-23 ENCOUNTER — Other Ambulatory Visit: Payer: Self-pay | Admitting: Nurse Practitioner

## 2013-05-23 DIAGNOSIS — R1032 Left lower quadrant pain: Secondary | ICD-10-CM

## 2013-05-23 DIAGNOSIS — R195 Other fecal abnormalities: Secondary | ICD-10-CM

## 2013-05-23 MED ORDER — AMOXICILLIN-POT CLAVULANATE 875-125 MG PO TABS: 1.0000 | ORAL_TABLET | Freq: Two times a day (BID) | ORAL | Status: DC

## 2013-05-23 NOTE — Telephone Encounter (Signed)
Patient with LLQ abdominal pain.  She reports that she has completed her cipro and flagyl, but her pain is not improved.  She does have pain meds at home, but she doesn't want to just continue on pain meds.  Discussed with Tye Savoy RNP she will review her chart.

## 2013-05-23 NOTE — Telephone Encounter (Signed)
Discussed again with Tye Savoy RNP she will review the chart and contact the patient herself.  Patient is advised to expect a call from [place]

## 2013-05-23 NOTE — Telephone Encounter (Signed)
She got better after 2nd day on Cipro and Flagyl but then had recurrent LLQ pain just prior to completion of antibiotics. Patient went to ED on 12/26 for evaluation. WBC normal. CTscan with contrast negative. Patient still having LLQ pain. Pain constant, not related to eating. No fevers. No urinary symptoms. No vaginal bleeding. Patient is s/p TAH. Bowels moving well on Miralax though they are very dark. No bismuth. At this point not sure if patient had mild diverticulitis when I saw her in clinic though she did get transient relief from antibiotics. Will give her a course of augmentin, ? Subclinical diverticulitis. She has ultram and it helps. Not sure what to make make of black stools without any upper symptoms.She will come to office Monday for CBC. She will call Mon or Tues with condition update. If not improvement I will see her in clinic on Wed or Thursday.

## 2013-05-26 ENCOUNTER — Other Ambulatory Visit (INDEPENDENT_AMBULATORY_CARE_PROVIDER_SITE_OTHER): Payer: Medicare Other

## 2013-05-26 DIAGNOSIS — R1032 Left lower quadrant pain: Secondary | ICD-10-CM

## 2013-05-26 LAB — CBC WITH DIFFERENTIAL/PLATELET
BASOS PCT: 0.9 % (ref 0.0–3.0)
Basophils Absolute: 0 10*3/uL (ref 0.0–0.1)
EOS PCT: 3.5 % (ref 0.0–5.0)
Eosinophils Absolute: 0.2 10*3/uL (ref 0.0–0.7)
HEMATOCRIT: 36.5 % (ref 36.0–46.0)
HEMOGLOBIN: 12.3 g/dL (ref 12.0–15.0)
LYMPHS ABS: 1.1 10*3/uL (ref 0.7–4.0)
Lymphocytes Relative: 21 % (ref 12.0–46.0)
MCHC: 33.6 g/dL (ref 30.0–36.0)
MCV: 91.6 fl (ref 78.0–100.0)
MONO ABS: 0.4 10*3/uL (ref 0.1–1.0)
MONOS PCT: 7.3 % (ref 3.0–12.0)
NEUTROS ABS: 3.5 10*3/uL (ref 1.4–7.7)
Neutrophils Relative %: 67.3 % (ref 43.0–77.0)
PLATELETS: 267 10*3/uL (ref 150.0–400.0)
RBC: 3.98 Mil/uL (ref 3.87–5.11)
RDW: 13.8 % (ref 11.5–14.6)
WBC: 5.3 10*3/uL (ref 4.5–10.5)

## 2013-05-26 NOTE — Addendum Note (Signed)
Addended by: Marlon Pel on: 05/26/2013 07:57 AM   Modules accepted: Orders

## 2013-05-26 NOTE — Telephone Encounter (Signed)
Orders entered for CBC

## 2013-06-10 ENCOUNTER — Encounter: Payer: Self-pay | Admitting: Obstetrics and Gynecology

## 2013-06-11 ENCOUNTER — Encounter: Payer: Self-pay | Admitting: Obstetrics and Gynecology

## 2013-06-11 ENCOUNTER — Ambulatory Visit: Payer: Self-pay | Admitting: Obstetrics and Gynecology

## 2013-06-11 ENCOUNTER — Ambulatory Visit (INDEPENDENT_AMBULATORY_CARE_PROVIDER_SITE_OTHER): Payer: Medicare Other | Admitting: Obstetrics and Gynecology

## 2013-06-11 VITALS — BP 122/76 | HR 72 | Ht 64.25 in | Wt 143.0 lb

## 2013-06-11 DIAGNOSIS — Z01419 Encounter for gynecological examination (general) (routine) without abnormal findings: Secondary | ICD-10-CM

## 2013-06-11 DIAGNOSIS — Z0000 Encounter for general adult medical examination without abnormal findings: Secondary | ICD-10-CM

## 2013-06-11 DIAGNOSIS — N9089 Other specified noninflammatory disorders of vulva and perineum: Secondary | ICD-10-CM

## 2013-06-11 MED ORDER — ESCITALOPRAM OXALATE 10 MG PO TABS: ORAL_TABLET | ORAL | Status: DC

## 2013-06-11 MED ORDER — ZOLPIDEM TARTRATE 5 MG PO TABS: 5.0000 mg | ORAL_TABLET | Freq: Every evening | ORAL | Status: DC | PRN

## 2013-06-11 NOTE — Patient Instructions (Signed)

## 2013-06-11 NOTE — Progress Notes (Signed)
Patient ID: Lauren [name], female   DOB: 1945/08/23, 68 y.o.   MRN: [id number] GYNECOLOGY VISIT  PCP:   Scarlette Calico, MD  Referring provider: none  HPI: 68 y.o.   Married  Caucasian  female   G2P2002 with No LMP recorded. Patient has had a hysterectomy.  TAH/BSO for fibroid 1998.    here for annual exam. No HRT. Having vaginal dryness. KY jelly helps.   Recent diverticulitis.   Had a CT scan.  Retired for one year.  Was a Agricultural engineer.   Started walking.   Has used Lexapro for anxiety.   Also asking for Ambien for insomnia.   Hgb:  PCP Urine:  PCP  GYNECOLOGIC HISTORY: No LMP recorded. Patient has had a hysterectomy. Sexually active:  yes Partner preference: female Contraception:   hysterectomy Menopausal hormone therapy:  none now, premarin in past DES exposure: no Blood transfusions:  none Sexually transmitted diseases:   none GYN Procedures:  Hysterectomy Mammogram:  01/2013, [facility]                 Pap:   2010, normal History of abnormal pap smear:  none   OB History   Grav Para Term Preterm Abortions TAB SAB Ect Mult Living   2 2 2       2        LIFESTYLE: Exercise:  Walking daily        Tobacco: never Alcohol:  none Drug use:  none  OTHER HEALTH MAINTENANCE: Tetanus/TDap:  2014 Gardisil:  n/a Influenza:  2014 Zostavax:  never  Bone density:  09/2006, normal Colonoscopy:  08/2009, repeat in 10 years  Cholesterol check:  04/2011, [facility]  Family History  Problem Relation Age of Onset  . Diabetes Brother   . Heart disease Mother   . Diabetes Mother   . Cervical cancer Mother   . Heart attack Mother   . Lung cancer Father   . Alcoholism Father   . Throat cancer Brother     Patient Active Problem List   Diagnosis Date Noted  . Diverticulitis of colon (without mention of hemorrhage) 05/10/2013   Past Medical History  Diagnosis Date  . Depression   . Diverticulitis   . Diverticulosis   . Anxiety   . Psoriasis      Past Surgical History  Procedure Laterality Date  . Cholecystectomy    . Cervical fusion    . Total abdominal hysterectomy  1998    Fibroid    ALLERGIES: Celebrex  Current Outpatient Prescriptions  Medication Sig Dispense Refill  . aspirin 81 MG tablet Take 81 mg by mouth daily.      Marland Kitchen escitalopram (LEXAPRO) 5 MG tablet Take 5 mg by mouth daily.      Marland Kitchen HYDROcodone-acetaminophen (NORCO) 5-325 MG per tablet Take 1-2 tablets by mouth every 6 (six) hours as needed for moderate pain.  20 tablet  0   No current facility-administered medications for this visit.     ROS:  Pertinent items are noted in HPI.  SOCIAL HISTORY:  Married.   PHYSICAL EXAMINATION:    BP 122/76  Pulse 72  Ht 5' 4.25" (1.632 m)  Wt 143 lb (64.864 kg)  BMI 24.35 kg/m2   Wt Readings from Last 3 Encounters:  06/11/13 143 lb (64.864 kg)  05/08/13 146 lb 6 oz (66.395 kg)     Ht Readings from Last 3 Encounters:  06/11/13 5' 4.25" (1.632 m)  05/08/13 5\' 4"  (1.626  m)    General appearance: alert, cooperative and appears stated age Head: Normocephalic, without obvious abnormality, atraumatic Neck: no adenopathy, supple, symmetrical, trachea midline and thyroid not enlarged, symmetric, no tenderness/mass/nodules Lungs: clear to auscultation bilaterally Breasts: Inspection negative, No nipple retraction or dimpling, No nipple discharge or bleeding, No axillary or supraclavicular adenopathy, Normal to palpation without dominant masses Heart: regular rate and rhythm Abdomen: soft, non-tender; no masses,  no organomegaly Extremities: extremities normal, atraumatic, no cyanosis or edema Skin: Skin color, texture, turgor normal. No rashes or lesions Lymph nodes: Cervical, supraclavicular, and axillary nodes normal. No abnormal inguinal nodes palpated Neurologic: Grossly normal  Pelvic: External genitalia:  Whitish change to the labia minora and the perianal region.                Urethra:  normal appearing  urethra with no masses, tenderness or lesions              Bartholins and Skenes: normal                 Vagina: normal appearing vagina with normal color and discharge, no lesions              Cervix: normal appearance              Pap and high risk HPV testing done: no.            Bimanual Exam:  Uterus:  uterus is normal size, shape, consistency and nontender                                      Adnexa: normal adnexa in size, nontender and no masses                                      Rectovaginal: Confirms                                      Anus:  normal sphincter tone, no lesions  ASSESSMENT    Status post TAH/BSO. Anxiety. Insomnia.  Vulvar lesions consistent with possible lichen sclerosis.  Atrophic vaginal changes.   PLAN  Mammogram yearly Pap smear and high risk HPV testing not indicated. Counseled on breast self exam, mammography screening, adequate intake of calcium and vitamin D Lipid profile by request.  Return for a vulvar/perianal biopsy.   Ambien 5 mg po q hs prn.  #30.  RF zero.  Lexapro 10 mg daily.  #90.  RF 3. I discussed over the counter remedies for vaginal dryness such as vit E and cooking oil.  Will not do vaginal estrogens at this time.  Bone density in the next 1 - 2 years.  Return annually or prn   An After Visit Summary was printed and given to the patient.

## 2013-06-12 ENCOUNTER — Telehealth: Payer: Self-pay | Admitting: Obstetrics and Gynecology

## 2013-06-12 LAB — LIPID PANEL
CHOL/HDL RATIO: 5.7 ratio
CHOLESTEROL: 165 mg/dL (ref 0–200)
HDL: 29 mg/dL — ABNORMAL LOW (ref >39)
LDL CALC: 81 mg/dL (ref 0–99)
Triglycerides: 273 mg/dL — ABNORMAL HIGH (ref <150)
VLDL: 55 mg/dL — AB (ref 0–40)

## 2013-06-12 NOTE — Telephone Encounter (Signed)
Telephoned patient/ advised of insurance quote of $125 copay for vulvar bx/ scheduled procedure//ssf

## 2013-06-19 ENCOUNTER — Telehealth: Payer: Self-pay | Admitting: Obstetrics and Gynecology

## 2013-06-19 NOTE — Telephone Encounter (Signed)
Patient wants the nurse to call her has questions about her upcoming biopsy

## 2013-06-19 NOTE — Telephone Encounter (Signed)
Patient wanted to know dx again so she can look it up on the computer. Biopsy is scheduled.  Advised per note from Dr. Quincy Simmonds: Vulvar lesions consistent with possible lichen sclerosis. Patient thankful, nothing else requested at this time.    Routing to provider for final review. Patient agreeable to disposition. Will close encounter

## 2013-06-23 ENCOUNTER — Telehealth: Payer: Self-pay | Admitting: Obstetrics and Gynecology

## 2013-06-23 ENCOUNTER — Other Ambulatory Visit: Payer: Self-pay | Admitting: Obstetrics and Gynecology

## 2013-06-23 DIAGNOSIS — E781 Pure hyperglyceridemia: Secondary | ICD-10-CM

## 2013-06-23 NOTE — Telephone Encounter (Signed)
Results from bloodwork.

## 2013-06-23 NOTE — Telephone Encounter (Signed)
Reviewed cholesterol results with patient and that would need to work on lowering triglyceride levels and increasing HDL.  Patient was not fasting when this was done, ate breakfast that morning.  Advised this may falsely elevate some values but still recommend working on reducing TRIG and repeat lipid panel in one year after 12 hour fast. Advised will review this with dr Quincy Simmonds and call her back with any additional info or instructions. Patient agreeable.  Any further instruction?

## 2013-06-23 NOTE — Telephone Encounter (Signed)
Call back to patient and notified of Dr Elza Rafter instructions.  Fasting lab appt scheduled for 09-22-13 at 0830. Patient agreeable. Encounter closed.

## 2013-06-23 NOTE — Telephone Encounter (Signed)
Please contact the patient back. I recommend that the patient adopt a low fat and low cholesterol diet, increase exercise, and then repeat her lipid panel, fasting, in 3 months.  I will place an order for this.

## 2013-07-10 ENCOUNTER — Ambulatory Visit (INDEPENDENT_AMBULATORY_CARE_PROVIDER_SITE_OTHER): Payer: Medicare Other | Admitting: Obstetrics and Gynecology

## 2013-07-10 ENCOUNTER — Encounter: Payer: Self-pay | Admitting: Obstetrics and Gynecology

## 2013-07-10 VITALS — BP 110/74 | HR 68 | Resp 16 | Ht 64.25 in | Wt 144.5 lb

## 2013-07-10 DIAGNOSIS — N9089 Other specified noninflammatory disorders of vulva and perineum: Secondary | ICD-10-CM

## 2013-07-10 NOTE — Progress Notes (Signed)
Patient ID: Lauren [name], female   DOB: 01/19/1946, 68 y.o.   MRN: [id number]  Subjective  Patient presents for vulvar biopsy. Had episode of itching on her bottom.   Objective  Consent performed. Vulva noted to have thinning and white discoloration along with architectural loss to the right and left of the clitoris.  Ulceration to the left of the clitoris on the left labia minora.  Sterile prep of the perineum and left labia minora with betadine.  Local 1% lidocaine to areas. 4 mm punch biopsy used and specimens to pathology separately. AgNO3 to areas. No complications.  Minimal EBL.  Assessment  Suspected lichen sclerosus.  Plan  Follow up biopsies and results to patient by phone.  I discussed lichen sclerosus in general and anticipated treatment with clobetasol ointment and described its proper use.  After visit summary to patient.

## 2013-07-10 NOTE — Patient Instructions (Addendum)
VULVAR BIOPSY POST-PROCEDURE INSTRUCTIONS  1. You may take Ibuprofen, Aleve or Tylenol for pain if needed.    2. You may have a small amount of spotting.  You should wear a mini pad for the next few days.  3. You may use some topical Neosporin ointment if you would like (over the counter is fine).  4. You need to call if you have redness around the biopsy site, if there is any unusual draining, if the bleeding is heavy, or if you are concerned.  5. Shower or bathe as normal  6. We will call you within one week with results  

## 2013-07-14 ENCOUNTER — Telehealth: Payer: Self-pay | Admitting: Internal Medicine

## 2013-07-14 LAB — IPS CERVICAL/ECC/EMB/VULVAR/VAGINAL BIOPSY

## 2013-07-14 NOTE — Telephone Encounter (Signed)
Spoke with patient and she states she is having lower abdominal cramping all across her abdomen. States this started last week. She is having normal formed stools but has increased in number of stools. Denies nausea, vomiting or fever. Scheduled with Dr. Olevia Perches tomorrow at 2:30 PM.

## 2013-07-15 ENCOUNTER — Ambulatory Visit (INDEPENDENT_AMBULATORY_CARE_PROVIDER_SITE_OTHER): Payer: Medicare Other | Admitting: Internal Medicine

## 2013-07-15 ENCOUNTER — Encounter: Payer: Self-pay | Admitting: Internal Medicine

## 2013-07-15 ENCOUNTER — Other Ambulatory Visit: Payer: Self-pay | Admitting: Obstetrics and Gynecology

## 2013-07-15 VITALS — BP 122/70 | HR 76 | Ht 64.25 in | Wt 145.8 lb

## 2013-07-15 DIAGNOSIS — R1031 Right lower quadrant pain: Secondary | ICD-10-CM

## 2013-07-15 DIAGNOSIS — K5732 Diverticulitis of large intestine without perforation or abscess without bleeding: Secondary | ICD-10-CM

## 2013-07-15 MED ORDER — TRIAMCINOLONE ACETONIDE 0.1 % EX CREA: 1.0000 "application " | TOPICAL_CREAM | Freq: Two times a day (BID) | CUTANEOUS | Status: DC

## 2013-07-15 MED ORDER — HYOSCYAMINE SULFATE 0.125 MG SL SUBL: 0.1250 mg | SUBLINGUAL_TABLET | SUBLINGUAL | Status: DC | PRN

## 2013-07-15 NOTE — Patient Instructions (Signed)
We have sent the following medications to your pharmacy for you to pick up at your convenience: Levsin SL  Please discontinue Miralax until Saturday. On Sunday, start taking 1/2 capful of Miralax daily x 1 week. At that time, you may decide whether to continue at 1/2 capful daily or increase to 1 capful daily.  CC:Dr Scarlette Calico

## 2013-07-15 NOTE — Progress Notes (Addendum)
Lauren [name] [birth year] [id number]  Note: This dictation was prepared with Dragon digital system. Any transcriptional errors that result from this procedure are unintentional.   History of Present Illness:  This is a 68 year old white female with symptomatic diverticulosis and recent flareup. At her last office visit on 05/08/2013 with Tye Savoy, NP, she was treated with a combination of Cipro and Flagyl for 10 days. A CT scan of the abdomen on 05/16/2013 confirmed diverticulosis but no sign of diverticulitis. She had a prior cholecystectomy. In the last several days she has been experiencing crampy abdominal pain across the suprapubic area as well as small frequent stools. She has been on MiraLax one capful everyday. She denies rectal bleeding. There has been no fever or back pain. There is a family history of a sigmoid resection for diverticulitis in her father.    Past Medical History  Diagnosis Date  . Depression   . Diverticulitis   . Diverticulosis   . Anxiety   . Psoriasis     Past Surgical History  Procedure Laterality Date  . Cholecystectomy    . Cervical fusion    . Total abdominal hysterectomy  1998    Fibroid    Allergies  Allergen Reactions  . Celebrex [Celecoxib] Itching and Rash    Family history and social history have been reviewed.  Review of Systems: Negative for nausea vomiting weight loss or rectal bleeding  The remainder of the 10 point ROS is negative except as outlined in the H&P  Physical Exam: General Appearance Well developed, in no distress Eyes  Non icteric  HEENT  Non traumatic, normocephalic  Mouth No lesion, tongue papillated, no cheilosis Neck Supple without adenopathy, thyroid not enlarged, no carotid bruits, no JVD Lungs Clear to auscultation bilaterally COR Normal S1, normal S2, regular rhythm, no murmur, quiet precordium Abdomen mildly protuberant but soft. Very tender in the left lower quadrant across the suprapubic area to  the right lower quadrant. There is no rebound. Left lower quadrant is most tender. Upper abdomen is unremarkable Rectal small amount of soft Hemoccult negative stool Extremities  No pedal edema Skin No lesions Neurological Alert and oriented x 3 Psychological Normal mood and affect  Assessment and Plan:   Problem #1 Symptomatic diverticulosis of the sigmoid colon. Patient is status post recent episode of diverticulitis not documented on a CT scan but responded to a 10 day course of Flagyl and Cipro as well as bowel rest. He is now having a recurrence of abdominal pain which is somewhat different than the diverticulitis and I think it is related to excess MiraLax which causes colon spasm and irritation as well as frequent small stools. She will stop MiraLax for 4 days and then restart only half capful daily. We will also start her on Levsin sublingually 0.125 mg every 4 hours when necessary for crampy abdominal pain. We have discussed her diet, in this case, low-residue diet until her colon spasm resolvs and then she could build it up to a high fiber diet eventually.    Delfin Edis 07/15/2013

## 2013-07-17 ENCOUNTER — Other Ambulatory Visit: Payer: Self-pay | Admitting: Physician Assistant

## 2013-07-18 ENCOUNTER — Telehealth: Payer: Self-pay | Admitting: *Deleted

## 2013-07-18 NOTE — Telephone Encounter (Signed)
Patient notified of path report as directed by Dr Quincy Simmonds.  Instructed that prescription cream has been called in and to use BID for itching.  Patient wants to know if this is to be used only for itching? Is she to use BID for full 6 weeks regardless of symptoms?  Declined to schedule follow-up but states she will call back to schedule, is having a flare of diverticulitis.  Please advise on triamcinolone instructions.

## 2013-07-18 NOTE — Telephone Encounter (Signed)
Message copied by Jaymes Graff on Fri Jul 18, 2013  2:23 PM ------      Message from: [place], BROOK E      Created: Tue Jul 15, 2013  1:48 PM       Please contact patient with results also.      I will send an Rx to her pharmacy for trimcinolone 0.1% cream to use on the vulva twice a day to treat itching.      She needs a recheck in 6 weeks. ------

## 2013-07-20 ENCOUNTER — Encounter (HOSPITAL_COMMUNITY): Payer: Self-pay | Admitting: Emergency Medicine

## 2013-07-20 ENCOUNTER — Emergency Department (HOSPITAL_COMMUNITY)
Admission: EM | Admit: 2013-07-20 | Discharge: 2013-07-20 | Disposition: A | Discharge status: Home / Self Care | Payer: Medicare Other | Attending: Emergency Medicine | Admitting: Emergency Medicine

## 2013-07-20 ENCOUNTER — Emergency Department (HOSPITAL_COMMUNITY): Payer: Medicare Other

## 2013-07-20 DIAGNOSIS — K5792 Diverticulitis of intestine, part unspecified, without perforation or abscess without bleeding: Secondary | ICD-10-CM

## 2013-07-20 DIAGNOSIS — F329 Major depressive disorder, single episode, unspecified: Secondary | ICD-10-CM | POA: Insufficient documentation

## 2013-07-20 DIAGNOSIS — R112 Nausea with vomiting, unspecified: Secondary | ICD-10-CM | POA: Insufficient documentation

## 2013-07-20 DIAGNOSIS — Z872 Personal history of diseases of the skin and subcutaneous tissue: Secondary | ICD-10-CM | POA: Insufficient documentation

## 2013-07-20 DIAGNOSIS — K5732 Diverticulitis of large intestine without perforation or abscess without bleeding: Secondary | ICD-10-CM | POA: Insufficient documentation

## 2013-07-20 DIAGNOSIS — F411 Generalized anxiety disorder: Secondary | ICD-10-CM | POA: Insufficient documentation

## 2013-07-20 DIAGNOSIS — R059 Cough, unspecified: Secondary | ICD-10-CM | POA: Insufficient documentation

## 2013-07-20 DIAGNOSIS — Z7982 Long term (current) use of aspirin: Secondary | ICD-10-CM | POA: Insufficient documentation

## 2013-07-20 DIAGNOSIS — R05 Cough: Secondary | ICD-10-CM | POA: Insufficient documentation

## 2013-07-20 DIAGNOSIS — R509 Fever, unspecified: Secondary | ICD-10-CM | POA: Insufficient documentation

## 2013-07-20 DIAGNOSIS — F3289 Other specified depressive episodes: Secondary | ICD-10-CM | POA: Insufficient documentation

## 2013-07-20 DIAGNOSIS — IMO0002 Reserved for concepts with insufficient information to code with codable children: Secondary | ICD-10-CM | POA: Insufficient documentation

## 2013-07-20 DIAGNOSIS — Z79899 Other long term (current) drug therapy: Secondary | ICD-10-CM | POA: Insufficient documentation

## 2013-07-20 LAB — COMPREHENSIVE METABOLIC PANEL
ALK PHOS: 117 U/L (ref 39–117)
ALT: 90 U/L — ABNORMAL HIGH (ref 0–35)
AST: 93 U/L — ABNORMAL HIGH (ref 0–37)
Albumin: 3.6 g/dL (ref 3.5–5.2)
BUN: 9 mg/dL (ref 6–23)
CALCIUM: 8.8 mg/dL (ref 8.4–10.5)
CO2: 26 meq/L (ref 19–32)
Chloride: 98 mEq/L (ref 96–112)
Creatinine, Ser: 0.78 mg/dL (ref 0.50–1.10)
GFR, EST NON AFRICAN AMERICAN: 84 mL/min — AB (ref >90)
GLUCOSE: 137 mg/dL — AB (ref 70–99)
Potassium: 3.7 mEq/L (ref 3.7–5.3)
SODIUM: 139 meq/L (ref 137–147)
Total Bilirubin: 1 mg/dL (ref 0.3–1.2)
Total Protein: 7 g/dL (ref 6.0–8.3)

## 2013-07-20 LAB — CBC WITH DIFFERENTIAL/PLATELET
Basophils Absolute: 0 10*3/uL (ref 0.0–0.1)
Basophils Relative: 0 % (ref 0–1)
Eosinophils Absolute: 0.1 10*3/uL (ref 0.0–0.7)
Eosinophils Relative: 1 % (ref 0–5)
HEMATOCRIT: 35.9 % — AB (ref 36.0–46.0)
Hemoglobin: 12.1 g/dL (ref 12.0–15.0)
LYMPHS ABS: 0.4 10*3/uL — AB (ref 0.7–4.0)
LYMPHS PCT: 3 % — AB (ref 12–46)
MCH: 31 pg (ref 26.0–34.0)
MCHC: 33.7 g/dL (ref 30.0–36.0)
MCV: 92.1 fL (ref 78.0–100.0)
Monocytes Absolute: 0.4 10*3/uL (ref 0.1–1.0)
Monocytes Relative: 4 % (ref 3–12)
Neutro Abs: 9.6 10*3/uL — ABNORMAL HIGH (ref 1.7–7.7)
Neutrophils Relative %: 92 % — ABNORMAL HIGH (ref 43–77)
PLATELETS: 182 10*3/uL (ref 150–400)
RBC: 3.9 MIL/uL (ref 3.87–5.11)
RDW: 13.4 % (ref 11.5–15.5)
WBC: 10.4 10*3/uL (ref 4.0–10.5)

## 2013-07-20 LAB — URINE MICROSCOPIC-ADD ON

## 2013-07-20 LAB — URINALYSIS, ROUTINE W REFLEX MICROSCOPIC
Bilirubin Urine: NEGATIVE
Glucose, UA: NEGATIVE mg/dL
HGB URINE DIPSTICK: NEGATIVE
Ketones, ur: NEGATIVE mg/dL
Nitrite: NEGATIVE
PROTEIN: NEGATIVE mg/dL
Specific Gravity, Urine: 1.014 (ref 1.005–1.030)
UROBILINOGEN UA: 0.2 mg/dL (ref 0.0–1.0)
pH: 5.5 (ref 5.0–8.0)

## 2013-07-20 LAB — LIPASE, BLOOD: Lipase: 52 U/L (ref 11–59)

## 2013-07-20 MED ORDER — AMOXICILLIN-POT CLAVULANATE 875-125 MG PO TABS: ORAL_TABLET | ORAL | Status: DC

## 2013-07-20 MED ORDER — ONDANSETRON HCL 8 MG PO TABS: 8.0000 mg | ORAL_TABLET | Freq: Three times a day (TID) | ORAL | Status: DC | PRN

## 2013-07-20 MED ORDER — ONDANSETRON HCL 4 MG/2ML IJ SOLN
4.0000 mg | Freq: Once | INTRAMUSCULAR | Status: AC
  Administered 2013-07-20: 4 mg via INTRAVENOUS
  Filled 2013-07-20: qty 2

## 2013-07-20 MED ORDER — IOHEXOL 300 MG/ML  SOLN
25.0000 mL | Freq: Once | INTRAMUSCULAR | Status: AC | PRN
  Administered 2013-07-20: 25 mL via ORAL

## 2013-07-20 MED ORDER — FENTANYL CITRATE 0.05 MG/ML IJ SOLN: 50.0000 ug | Freq: Once | INTRAMUSCULAR | Status: DC

## 2013-07-20 MED ORDER — IOHEXOL 300 MG/ML  SOLN
100.0000 mL | Freq: Once | INTRAMUSCULAR | Status: AC | PRN
  Administered 2013-07-20: 100 mL via INTRAVENOUS

## 2013-07-20 MED ORDER — SODIUM CHLORIDE 0.9 % IV BOLUS (SEPSIS)
500.0000 mL | Freq: Once | INTRAVENOUS | Status: AC
  Administered 2013-07-20: 500 mL via INTRAVENOUS

## 2013-07-20 MED ORDER — MORPHINE SULFATE 4 MG/ML IJ SOLN
4.0000 mg | Freq: Once | INTRAMUSCULAR | Status: AC
  Administered 2013-07-20: 4 mg via INTRAVENOUS
  Filled 2013-07-20: qty 1

## 2013-07-20 MED ORDER — OXYCODONE-ACETAMINOPHEN 5-325 MG PO TABS: 1.0000 | ORAL_TABLET | ORAL | Status: DC | PRN

## 2013-07-20 MED ORDER — SODIUM CHLORIDE 0.9 % IV SOLN
3.0000 g | Freq: Once | INTRAVENOUS | Status: AC
  Administered 2013-07-20: 3 g via INTRAVENOUS
  Filled 2013-07-20: qty 3

## 2013-07-20 NOTE — ED Provider Notes (Signed)
CSN: [id number]     Arrival date & time 07/20/13  0238 History   First MD Initiated Contact with Patient 07/20/13 [phone]     Chief Complaint  Patient presents with  . Abdominal Pain      Patient is a 68 y.o. female presenting with abdominal pain. The history is provided by the patient.  Abdominal Pain Pain location:  LLQ Pain quality: sharp   Pain radiates to:  RLQ Pain severity:  Severe Onset quality:  Gradual Duration:  2 days Timing:  Intermittent Progression:  Worsening Chronicity:  Recurrent Relieved by:  None tried Worsened by:  Palpation and movement Associated symptoms: cough, fever, nausea and vomiting   Associated symptoms: no chest pain, no diarrhea, no dysuria and no shortness of breath   pt reports h/o diverticulitis last December that resolved She now reports increased pain in past 2 days, similar to prior episodes She reports nonbloody vomitus without diarrhea She reports her abd pain is throughout lower abdomen She was seen on 07/15/13 for abd pain by her GI physician but pain worsened in past 2 days She was also recently started on cipro/flagyll for possible infection, and was able to tolerate this at one point but vomited last dose  Past Medical History  Diagnosis Date  . Depression   . Diverticulitis   . Diverticulosis   . Anxiety   . Psoriasis    Past Surgical History  Procedure Laterality Date  . Cholecystectomy    . Cervical fusion    . Total abdominal hysterectomy  1998    Fibroid   Family History  Problem Relation Age of Onset  . Diabetes Brother   . Heart disease Mother   . Diabetes Mother   . Cervical cancer Mother   . Heart attack Mother   . Lung cancer Father   . Alcoholism Father   . Throat cancer Brother    History  Substance Use Topics  . Smoking status: Never Smoker   . Smokeless tobacco: Never Used  . Alcohol Use: Yes     Comment: social   OB History   Grav Para Term Preterm Abortions TAB SAB Ect Mult Living   2 2 2        2      Review of Systems  Constitutional: Positive for fever.  Respiratory: Positive for cough. Negative for shortness of breath.   Cardiovascular: Negative for chest pain.  Gastrointestinal: Positive for nausea, vomiting and abdominal pain. Negative for diarrhea and blood in stool.  Genitourinary: Negative for dysuria.  All other systems reviewed and are negative.      Allergies  Celebrex  Home Medications   Current Outpatient Rx  Name  Route  Sig  Dispense  Refill  . aspirin 81 MG tablet   Oral   Take 81 mg by mouth daily.         . calcium carbonate (TUMS - DOSED IN MG ELEMENTAL CALCIUM) 500 MG chewable tablet   Oral   Chew 1 tablet by mouth daily.         . ciprofloxacin (CIPRO) 500 MG tablet   Oral   Take 500 mg by mouth 2 (two) times daily. Take for 10 days         . escitalopram (LEXAPRO) 10 MG tablet      Take one tablet, 10 mg, by mouth daily.   90 tablet   3   . hyoscyamine (LEVSIN/SL) 0.125 MG SL tablet   Sublingual   Place  1 tablet (0.125 mg total) under the tongue every 4 (four) hours as needed for cramping.   30 tablet   1   . metroNIDAZOLE (FLAGYL) 500 MG tablet   Oral   Take 500 mg by mouth 3 (three) times daily. Take for 10 days         . zolpidem (AMBIEN) 5 MG tablet   Oral   Take 5 mg by mouth at bedtime as needed for sleep.         Marland Kitchen triamcinolone cream (KENALOG) 0.1 %   Topical   Apply 1 application topically 2 (two) times daily.   30 g   0    BP 103/52  Pulse 109  Temp(Src) 100.1 F (37.8 C) (Oral)  Resp 20  Ht 5\' 5"  (1.651 m)  Wt 147 lb 7 oz (66.877 kg)  BMI 24.53 kg/m2  SpO2 97% Physical Exam CONSTITUTIONAL: Well developed/well nourished HEAD: Normocephalic/atraumatic EYES: EOMI/PERRL ENMT: Mucous membranes moist NECK: supple no meningeal signs SPINE:entire spine nontender CV: S1/S2 noted, no murmurs/rubs/gallops noted LUNGS: Lungs are clear to auscultation bilaterally, no apparent distress ABDOMEN: soft,  moderate tenderness to RLQ and LLQ, no rebound or guarding GU:no cva tenderness NEURO: Pt is awake/alert, moves all extremitiesx4 EXTREMITIES: pulses normal, full ROM SKIN: warm, color normal PSYCH: no abnormalities of mood noted  ED Course  Procedures  3:55 AM Pt with previous h/o diverticulitis Given her symptoms, focal tenderness and fever, will obtain CT abd/pelvis Pt agreeable with plan 5:14 AM Pt feels improved Acute diverticulitis without complicating features noted Will give dose of unasyn and reassess 6:41 AM Pt feels much improved Pain improved No vomiting She would like to go home We discussed strict return precautions Will start augmenting and ask to hold her cipro/flagyll  Labs Review Labs Reviewed  CBC WITH DIFFERENTIAL - Abnormal; Notable for the following:    HCT 35.9 (*)    Neutrophils Relative % 92 (*)    Neutro Abs 9.6 (*)    Lymphocytes Relative 3 (*)    Lymphs Abs 0.4 (*)    All other components within normal limits  COMPREHENSIVE METABOLIC PANEL - Abnormal; Notable for the following:    Glucose, Bld 137 (*)    AST 93 (*)    ALT 90 (*)    GFR calc non Af Amer 84 (*)    All other components within normal limits  LIPASE, BLOOD  URINALYSIS, ROUTINE W REFLEX MICROSCOPIC   Imaging Review Ct Abdomen Pelvis W Contrast  07/20/2013   CLINICAL DATA:  Abdominal pain, history of diverticulosis.  EXAM: CT ABDOMEN AND PELVIS WITH CONTRAST  TECHNIQUE: Multidetector CT imaging of the abdomen and pelvis was performed using the standard protocol following bolus administration of intravenous contrast.  CONTRAST:  151mL OMNIPAQUE IOHEXOL 300 MG/ML  SOLN  COMPARISON:  CT ABD/PELVIS W CM dated 05/16/2013  FINDINGS: Included view of the lung bases demonstrates dependent atelectasis. The visualized heart and pericardium are nonsuspicious.  Patchy hypodensity about the falciform ligament likely reflects focal fatty infiltration, the liver is otherwise unremarkable and  unchanged. The spleen, pancreas, adrenal glands are unremarkable. Status post cholecystectomy.  Descending colon and sigmoid diverticulosis with superimposed sigmoid inflammatory changes and trace free fluid. No intraperitoneal free air. No bowel obstruction. Normal appendix. Contrast in the small bowel. Small hiatal hernia. The stomach is unremarkable.  Kidneys are nonsuspicious. Delayed imaging demonstrates prompt symmetric excretion of contrast into the proximal urinary collecting system. Too small to characterize hypodensity in left renal  lower pole. Aortoiliac vessels are normal in course and caliber are within mild calcific atherosclerosis. Urinary bladder is well distended and unremarkable. Status post hysterectomy.  Soft tissues are nonsuspicious. Moderate to severe L4-5 degenerative disc disease.  IMPRESSION: Acute sigmoid diverticulitis without bowel perforation, nor bowel obstruction.   Electronically Signed   By: Elon Alas   On: 07/20/2013 04:59      MDM   Final diagnoses:  Acute diverticulitis    Nursing notes including past medical history and social history reviewed and considered in documentation Labs/vital reviewed and considered Previous records reviewed and considered     Sharyon Cable, MD 07/20/13 [phone]

## 2013-07-20 NOTE — Telephone Encounter (Signed)
The steroid cream is for itching and external irritation due to her biopsy showing dermatitis. This can be used twice a day as needed. I expect that the patient may need to use it for weeks to resolve the irritation. However, if her symptoms resolve sooner, she can reduce the use to once a day for a week, then twice a week for a couple of weeks, and then stop. She may need to do occasional retreatment if the dermatitis recurs.  I hope this helps!

## 2013-07-20 NOTE — ED Notes (Signed)
Pt States lower and upper abdominal pain with N/V/D x 2. Pain described as shooting

## 2013-07-20 NOTE — Discharge Instructions (Signed)
Diverticulitis °A diverticulum is a small pouch or sac on the colon. Diverticulosis is the presence of these diverticula on the colon. Diverticulitis is the irritation (inflammation) or infection of diverticula. °CAUSES  °The colon and its diverticula contain bacteria. If food particles block the tiny opening to a diverticulum, the bacteria inside can grow and cause an increase in pressure. This leads to infection and inflammation and is called diverticulitis. °SYMPTOMS  °· Abdominal pain and tenderness. Usually, the pain is located on the left side of your abdomen. However, it could be located elsewhere. °· Fever. °· Bloating. °· Feeling sick to your stomach (nausea). °· Throwing up (vomiting). °· Abnormal stools. °DIAGNOSIS  °Your caregiver will take a history and perform a physical exam. Since many things can cause abdominal pain, other tests may be necessary. Tests may include: °· Blood tests. °· Urine tests. °· X-ray of the abdomen. °· CT scan of the abdomen. °Sometimes, surgery is needed to determine if diverticulitis or other conditions are causing your symptoms. °TREATMENT  °Most of the time, you can be treated without surgery. Treatment includes: °· Resting the bowels by only having liquids for a few days. As you improve, you will need to eat a low-fiber diet. °· Intravenous (IV) fluids if you are losing body fluids (dehydrated). °· Antibiotic medicines that treat infections may be given. °· Pain and nausea medicine, if needed. °· Surgery if the inflamed diverticulum has burst. °HOME CARE INSTRUCTIONS  °· Try a clear liquid diet (broth, tea, or water for as long as directed by your caregiver). You may then gradually begin a low-fiber diet as tolerated.  °A low-fiber diet is a diet with less than 10 grams of fiber. Choose the foods below to reduce fiber in the diet: °· White breads, cereals, rice, and pasta. °· Cooked fruits and vegetables or soft fresh fruits and vegetables without the skin. °· Ground or  well-cooked tender beef, ham, veal, lamb, pork, or poultry. °· Eggs and seafood. °· After your diverticulitis symptoms have improved, your caregiver may put you on a high-fiber diet. A high-fiber diet includes 14 grams of fiber for every 1000 calories consumed. For a standard 2000 calorie diet, you would need 28 grams of fiber. Follow these diet guidelines to help you increase the fiber in your diet. It is important to slowly increase the amount fiber in your diet to avoid gas, constipation, and bloating. °· Choose whole-grain breads, cereals, pasta, and brown rice. °· Choose fresh fruits and vegetables with the skin on. Do not overcook vegetables because the more vegetables are cooked, the more fiber is lost. °· Choose more nuts, seeds, legumes, dried peas, beans, and lentils. °· Look for food products that have greater than 3 grams of fiber per serving on the Nutrition Facts label. °· Take all medicine as directed by your caregiver. °· If your caregiver has given you a follow-up appointment, it is very important that you go. Not going could result in lasting (chronic) or permanent injury, pain, and disability. If there is any problem keeping the appointment, call to reschedule. °SEEK MEDICAL CARE IF:  °· Your pain does not improve. °· You have a hard time advancing your diet beyond clear liquids. °· Your bowel movements do not return to normal. °SEEK IMMEDIATE MEDICAL CARE IF:  °· Your pain becomes worse. °· You have an oral temperature above 102° F (38.9° C), not controlled by medicine. °· You have repeated vomiting. °· You have bloody or black, tarry stools. °·   Symptoms that brought you to your caregiver become worse or are not getting better. °MAKE SURE YOU:  °· Understand these instructions. °· Will watch your condition. °· Will get help right away if you are not doing well or get worse. °Document Released: 02/15/2005 Document Revised: 07/31/2011 Document Reviewed: 06/13/2010 °[employer]® Patient Information  ©2014 [employer]. ° °

## 2013-07-20 NOTE — ED Notes (Signed)
Wickline MD at bedside  

## 2013-07-21 NOTE — Telephone Encounter (Signed)
Thank you for the update.  I will close the encounter. 

## 2013-07-21 NOTE — Telephone Encounter (Signed)
Patient notified of Dr Elza Rafter instruction.  6 week recheck scheduled for 09-10-13.  Patient also due for FLP and requests to combine these appointments.   Patient was hospitalized over weekend for diverticulitis, they have changed her meds and she is feeling better.  Dr Quincy Simmonds, route back to you so you would know about hospitalization.  Ok to close encounter?

## 2013-07-22 ENCOUNTER — Emergency Department (HOSPITAL_COMMUNITY): Payer: Medicare Other

## 2013-07-22 ENCOUNTER — Encounter (HOSPITAL_COMMUNITY): Payer: Self-pay | Admitting: Emergency Medicine

## 2013-07-22 ENCOUNTER — Emergency Department (HOSPITAL_COMMUNITY)
Admission: EM | Admit: 2013-07-22 | Discharge: 2013-07-22 | Disposition: A | Discharge status: Home / Self Care | Payer: Medicare Other | Attending: Emergency Medicine | Admitting: Emergency Medicine

## 2013-07-22 DIAGNOSIS — Z7982 Long term (current) use of aspirin: Secondary | ICD-10-CM | POA: Insufficient documentation

## 2013-07-22 DIAGNOSIS — F329 Major depressive disorder, single episode, unspecified: Secondary | ICD-10-CM | POA: Insufficient documentation

## 2013-07-22 DIAGNOSIS — R109 Unspecified abdominal pain: Secondary | ICD-10-CM | POA: Insufficient documentation

## 2013-07-22 DIAGNOSIS — R112 Nausea with vomiting, unspecified: Secondary | ICD-10-CM

## 2013-07-22 DIAGNOSIS — Z872 Personal history of diseases of the skin and subcutaneous tissue: Secondary | ICD-10-CM | POA: Insufficient documentation

## 2013-07-22 DIAGNOSIS — Z79899 Other long term (current) drug therapy: Secondary | ICD-10-CM | POA: Insufficient documentation

## 2013-07-22 DIAGNOSIS — F411 Generalized anxiety disorder: Secondary | ICD-10-CM | POA: Insufficient documentation

## 2013-07-22 DIAGNOSIS — F3289 Other specified depressive episodes: Secondary | ICD-10-CM | POA: Insufficient documentation

## 2013-07-22 DIAGNOSIS — R197 Diarrhea, unspecified: Secondary | ICD-10-CM | POA: Insufficient documentation

## 2013-07-22 DIAGNOSIS — IMO0002 Reserved for concepts with insufficient information to code with codable children: Secondary | ICD-10-CM | POA: Insufficient documentation

## 2013-07-22 DIAGNOSIS — Z8719 Personal history of other diseases of the digestive system: Secondary | ICD-10-CM | POA: Insufficient documentation

## 2013-07-22 LAB — CBC WITH DIFFERENTIAL/PLATELET
BASOS ABS: 0 10*3/uL (ref 0.0–0.1)
Basophils Relative: 0 % (ref 0–1)
Eosinophils Absolute: 0.1 10*3/uL (ref 0.0–0.7)
Eosinophils Relative: 1 % (ref 0–5)
HCT: 36.8 % (ref 36.0–46.0)
HEMOGLOBIN: 12.3 g/dL (ref 12.0–15.0)
LYMPHS ABS: 0.4 10*3/uL — AB (ref 0.7–4.0)
LYMPHS PCT: 4 % — AB (ref 12–46)
MCH: 31.1 pg (ref 26.0–34.0)
MCHC: 33.4 g/dL (ref 30.0–36.0)
MCV: 93.2 fL (ref 78.0–100.0)
Monocytes Absolute: 0.4 10*3/uL (ref 0.1–1.0)
Monocytes Relative: 4 % (ref 3–12)
NEUTROS ABS: 9.7 10*3/uL — AB (ref 1.7–7.7)
Neutrophils Relative %: 92 % — ABNORMAL HIGH (ref 43–77)
Platelets: 189 10*3/uL (ref 150–400)
RBC: 3.95 MIL/uL (ref 3.87–5.11)
RDW: 13.4 % (ref 11.5–15.5)
WBC: 10.6 10*3/uL — ABNORMAL HIGH (ref 4.0–10.5)

## 2013-07-22 LAB — COMPREHENSIVE METABOLIC PANEL
ALT: 60 U/L — ABNORMAL HIGH (ref 0–35)
AST: 40 U/L — AB (ref 0–37)
Albumin: 3.7 g/dL (ref 3.5–5.2)
Alkaline Phosphatase: 102 U/L (ref 39–117)
BILIRUBIN TOTAL: 0.5 mg/dL (ref 0.3–1.2)
BUN: 12 mg/dL (ref 6–23)
CHLORIDE: 102 meq/L (ref 96–112)
CO2: 25 meq/L (ref 19–32)
Calcium: 9.1 mg/dL (ref 8.4–10.5)
Creatinine, Ser: 0.64 mg/dL (ref 0.50–1.10)
GFR calc Af Amer: >90 mL/min (ref >90)
GFR, EST NON AFRICAN AMERICAN: 90 mL/min — AB (ref >90)
Glucose, Bld: 121 mg/dL — ABNORMAL HIGH (ref 70–99)
Potassium: 3.9 mEq/L (ref 3.7–5.3)
Sodium: 143 mEq/L (ref 137–147)
Total Protein: 7.5 g/dL (ref 6.0–8.3)

## 2013-07-22 LAB — URINALYSIS, ROUTINE W REFLEX MICROSCOPIC
Bilirubin Urine: NEGATIVE
GLUCOSE, UA: NEGATIVE mg/dL
Hgb urine dipstick: NEGATIVE
KETONES UR: 15 mg/dL — AB
LEUKOCYTES UA: NEGATIVE
Nitrite: NEGATIVE
PROTEIN: NEGATIVE mg/dL
Specific Gravity, Urine: 1.016 (ref 1.005–1.030)
Urobilinogen, UA: 0.2 mg/dL (ref 0.0–1.0)
pH: 7.5 (ref 5.0–8.0)

## 2013-07-22 LAB — LIPASE, BLOOD: Lipase: 168 U/L — ABNORMAL HIGH (ref 11–59)

## 2013-07-22 MED ORDER — ONDANSETRON HCL 4 MG/2ML IJ SOLN
4.0000 mg | Freq: Once | INTRAMUSCULAR | Status: AC
  Administered 2013-07-22: 4 mg via INTRAVENOUS
  Filled 2013-07-22: qty 2

## 2013-07-22 MED ORDER — PROMETHAZINE HCL 25 MG RE SUPP
25.0000 mg | Freq: Four times a day (QID) | RECTAL | Status: DC | PRN
  Filled 2013-07-22 (×2): qty 1

## 2013-07-22 MED ORDER — MORPHINE SULFATE 4 MG/ML IJ SOLN
4.0000 mg | Freq: Once | INTRAMUSCULAR | Status: AC
  Administered 2013-07-22: 4 mg via INTRAVENOUS
  Filled 2013-07-22: qty 1

## 2013-07-22 MED ORDER — SODIUM CHLORIDE 0.9 % IV BOLUS (SEPSIS)
1000.0000 mL | Freq: Once | INTRAVENOUS | Status: AC
  Administered 2013-07-22: 1000 mL via INTRAVENOUS

## 2013-07-22 MED ORDER — PROMETHAZINE HCL 25 MG/ML IJ SOLN
12.5000 mg | Freq: Four times a day (QID) | INTRAMUSCULAR | Status: DC | PRN
Start: 2013-07-22 — End: 2013-07-22
  Administered 2013-07-22: 12.5 mg via INTRAVENOUS
  Filled 2013-07-22: qty 1

## 2013-07-22 MED ORDER — PROMETHAZINE HCL 25 MG PO TABS: 25.0000 mg | ORAL_TABLET | Freq: Four times a day (QID) | ORAL | Status: DC | PRN

## 2013-07-22 NOTE — ED Notes (Signed)
Patient transported to X-ray 

## 2013-07-22 NOTE — Discharge Instructions (Signed)
Take phenergan as needed for nausea. Refer to attached documents for more information. Return to the ED with worsening or concerning symptoms.  °

## 2013-07-22 NOTE — ED Notes (Signed)
D/c I/v 

## 2013-07-22 NOTE — ED Notes (Signed)
Pt here with left lower quadrant pain along with some n/v//d

## 2013-07-22 NOTE — ED Notes (Signed)
Returned from x xray

## 2013-07-22 NOTE — ED Provider Notes (Signed)
CSN: [id number]     Arrival date & time 07/22/13  0540 History   First MD Initiated Contact with Patient 07/22/13 [phone]     Chief Complaint  Patient presents with  . Emesis     (Consider location/radiation/quality/duration/timing/severity/associated sxs/prior Treatment) HPI Comments: Patient is a 68 year old female with a past medical history of diverticulitis who presents with abdominal pain with associated NVD for the past 2 days. Symptoms started gradually and progressively worsened since the onset. The pain is sharp and severe without radiation. Patient reports being seen in the ED 2 days ago and was diagnosed with acute diverticulitis. Patient reports being sent home with Augmentin and was told to discontinue her Cipro and Flagyl regimen. Patient became concerned because she acutely started having diarrhea and was unable to control her nausea at home. No aggravating/alleviating factors. No other associated symptoms. Patient denies fever.    Past Medical History  Diagnosis Date  . Depression   . Diverticulitis   . Diverticulosis   . Anxiety   . Psoriasis    Past Surgical History  Procedure Laterality Date  . Cholecystectomy    . Cervical fusion    . Total abdominal hysterectomy  1998    Fibroid   Family History  Problem Relation Age of Onset  . Diabetes Brother   . Heart disease Mother   . Diabetes Mother   . Cervical cancer Mother   . Heart attack Mother   . Lung cancer Father   . Alcoholism Father   . Throat cancer Brother    History  Substance Use Topics  . Smoking status: Never Smoker   . Smokeless tobacco: Never Used  . Alcohol Use: Yes     Comment: social   OB History   Grav Para Term Preterm Abortions TAB SAB Ect Mult Living   2 2 2       2      Review of Systems  Constitutional: Negative for fever, chills and fatigue.  HENT: Negative for trouble swallowing.   Eyes: Negative for visual disturbance.  Respiratory: Negative for shortness of breath.    Cardiovascular: Negative for chest pain and palpitations.  Gastrointestinal: Positive for nausea, vomiting, abdominal pain and diarrhea.  Genitourinary: Negative for dysuria and difficulty urinating.  Musculoskeletal: Negative for arthralgias and neck pain.  Skin: Negative for color change.  Neurological: Negative for dizziness and weakness.  Psychiatric/Behavioral: Negative for dysphoric mood.      Allergies  Celebrex  Home Medications   Current Outpatient Rx  Name  Route  Sig  Dispense  Refill  . amoxicillin-clavulanate (AUGMENTIN) 875-125 MG per tablet   Oral   Take 1 tablet by mouth 2 (two) times daily.         Marland Kitchen aspirin 81 MG tablet   Oral   Take 81 mg by mouth daily.         . calcium carbonate (TUMS - DOSED IN MG ELEMENTAL CALCIUM) 500 MG chewable tablet   Oral   Chew 1 tablet by mouth daily.         Marland Kitchen escitalopram (LEXAPRO) 10 MG tablet   Oral   Take 10 mg by mouth daily.         . hyoscyamine (LEVSIN/SL) 0.125 MG SL tablet   Sublingual   Place 1 tablet (0.125 mg total) under the tongue every 4 (four) hours as needed for cramping.   30 tablet   1   . ondansetron (ZOFRAN) 8 MG tablet  Oral   Take 1 tablet (8 mg total) by mouth every 8 (eight) hours as needed for nausea.   12 tablet   0   . oxyCODONE-acetaminophen (PERCOCET/ROXICET) 5-325 MG per tablet   Oral   Take 1 tablet by mouth every 4 (four) hours as needed for severe pain.   15 tablet   0   . triamcinolone cream (KENALOG) 0.1 %   Topical   Apply 1 application topically 2 (two) times daily.   30 g   0   . zolpidem (AMBIEN) 5 MG tablet   Oral   Take 5 mg by mouth at bedtime as needed for sleep.          BP 130/58  Pulse 84  Temp(Src) 98.4 F (36.9 C) (Oral)  Resp 18  Ht 5\' 4"  (1.626 m)  Wt 146 lb (66.225 kg)  BMI 25.05 kg/m2  SpO2 90% Physical Exam  Nursing note and vitals reviewed. Constitutional: She is oriented to person, place, and time. She appears well-developed  and well-nourished. No distress.  HENT:  Head: Normocephalic and atraumatic.  Eyes: Conjunctivae and EOM are normal.  Neck: Normal range of motion.  Cardiovascular: Normal rate and regular rhythm.  Exam reveals no gallop and no friction rub.   No murmur heard. Pulmonary/Chest: Effort normal and breath sounds normal. She has no wheezes. She has no rales. She exhibits no tenderness.  Abdominal: Soft. She exhibits no distension. There is tenderness. There is no rebound and no guarding.  LLQ tenderness to palpation. No other focal tenderness or peritoneal signs.   Musculoskeletal: Normal range of motion.  Neurological: She is alert and oriented to person, place, and time.  Speech is goal-oriented. Moves limbs without ataxia.   Skin: Skin is warm and dry.  Psychiatric: She has a normal mood and affect. Her behavior is normal.    ED Course  Procedures (including critical care time)   Labs Review Labs Reviewed  CBC WITH DIFFERENTIAL - Abnormal; Notable for the following:    WBC 10.6 (*)    Neutrophils Relative % 92 (*)    Neutro Abs 9.7 (*)    Lymphocytes Relative 4 (*)    Lymphs Abs 0.4 (*)    All other components within normal limits  COMPREHENSIVE METABOLIC PANEL - Abnormal; Notable for the following:    Glucose, Bld 121 (*)    AST 40 (*)    ALT 60 (*)    GFR calc non Af Amer 90 (*)    All other components within normal limits  LIPASE, BLOOD - Abnormal; Notable for the following:    Lipase 168 (*)    All other components within normal limits  URINALYSIS, ROUTINE W REFLEX MICROSCOPIC - Abnormal; Notable for the following:    Ketones, ur 15 (*)    All other components within normal limits  STOOL CULTURE  CLOSTRIDIUM DIFFICILE BY PCR   Imaging Review Dg Abd Acute W/chest  07/22/2013   CLINICAL DATA:  Nausea, vomiting, diarrhea. Left abdominal pain. History of diverticulitis. Rule out free air.  EXAM: ACUTE ABDOMEN SERIES (ABDOMEN 2 VIEW & CHEST 1 VIEW)  COMPARISON:  Abdominal  CT from 2 days prior  FINDINGS: Few, nonspecific fluid levels in the proximal colon, no specific evidence of bowel obstruction. No visible pneumoperitoneum.  No edema or consolidation. There is mild atelectatic changes at the left base, which appear transient on decubitus imaging. Normal heart size. Cholecystectomy changes.  IMPRESSION: No evidence of free air or bowel  obstruction.   Electronically Signed   By: Jorje Guild M.D.   On: 07/22/2013 06:50     EKG Interpretation None      MDM   Final diagnoses:  Nausea and vomiting  Diarrhea    6:50 AM Labs and acute abdominal series pending. Vitals stable and patient afebrile. Patient will have morphine, zofran and fluids for symptoms.   8:59 AM Patient is feeling better and labs appear to be improved compared to 2 days ago. The only acutely changed value is lipase which is elevated at 168. Patient has no epigastric or RUQ pain. I assume this acute elevation is due to nausea and vomiting. Patient is currently drinking water. If she is able to tolerate PO, I will discharge her with phenergan.   9:28 AM Patient able to tolerate fluids PO. Patient will be discharged with Phenergan and instructions to return with worsening or concerning symptoms.   Alvina Chou, PA-C 07/23/13 2005

## 2013-07-22 NOTE — ED Notes (Signed)
Pt was getting dressed for discharged and threw up times 2 , PA notified and discharge on hold at this time

## 2013-07-24 NOTE — ED Provider Notes (Signed)
Medical screening examination/treatment/procedure(s) were performed by non-physician practitioner and as supervising physician I was immediately available for consultation/collaboration.     Veryl Speak, MD 07/24/13 [phone]

## 2013-09-05 ENCOUNTER — Telehealth: Payer: Self-pay | Admitting: Obstetrics and Gynecology

## 2013-09-05 NOTE — Telephone Encounter (Signed)
Thanks Alinda Sierras.  I will close the encounter.

## 2013-09-05 NOTE — Telephone Encounter (Signed)
Patient canceled her upcoming 6 week recheck appointment 09/10/13.  Patient does not wish to reschedule.

## 2013-09-10 ENCOUNTER — Ambulatory Visit: Payer: Medicare Other | Admitting: Obstetrics and Gynecology

## 2013-09-22 ENCOUNTER — Other Ambulatory Visit: Payer: Medicare Other

## 2013-10-08 ENCOUNTER — Encounter: Payer: Self-pay | Admitting: Internal Medicine

## 2013-10-08 ENCOUNTER — Ambulatory Visit (INDEPENDENT_AMBULATORY_CARE_PROVIDER_SITE_OTHER): Payer: Medicare Other | Admitting: Internal Medicine

## 2013-10-08 VITALS — BP 118/70 | HR 80 | Temp 98.4°F | Ht 64.0 in | Wt 143.0 lb

## 2013-10-08 DIAGNOSIS — Z23 Encounter for immunization: Secondary | ICD-10-CM

## 2013-10-08 DIAGNOSIS — R7302 Impaired glucose tolerance (oral): Secondary | ICD-10-CM

## 2013-10-08 DIAGNOSIS — Z0000 Encounter for general adult medical examination without abnormal findings: Secondary | ICD-10-CM

## 2013-10-08 DIAGNOSIS — M5090 Cervical disc disorder, unspecified, unspecified cervical region: Secondary | ICD-10-CM | POA: Insufficient documentation

## 2013-10-08 DIAGNOSIS — F419 Anxiety disorder, unspecified: Secondary | ICD-10-CM | POA: Insufficient documentation

## 2013-10-08 DIAGNOSIS — R7309 Other abnormal glucose: Secondary | ICD-10-CM

## 2013-10-08 DIAGNOSIS — K5792 Diverticulitis of intestine, part unspecified, without perforation or abscess without bleeding: Secondary | ICD-10-CM | POA: Insufficient documentation

## 2013-10-08 DIAGNOSIS — F329 Major depressive disorder, single episode, unspecified: Secondary | ICD-10-CM | POA: Insufficient documentation

## 2013-10-08 DIAGNOSIS — Z0001 Encounter for general adult medical examination with abnormal findings: Secondary | ICD-10-CM | POA: Insufficient documentation

## 2013-10-08 DIAGNOSIS — L409 Psoriasis, unspecified: Secondary | ICD-10-CM | POA: Insufficient documentation

## 2013-10-08 DIAGNOSIS — I6529 Occlusion and stenosis of unspecified carotid artery: Secondary | ICD-10-CM

## 2013-10-08 DIAGNOSIS — F32A Depression, unspecified: Secondary | ICD-10-CM | POA: Insufficient documentation

## 2013-10-08 HISTORY — DX: Impaired glucose tolerance (oral): R73.02

## 2013-10-08 HISTORY — DX: Occlusion and stenosis of unspecified carotid artery: I65.29

## 2013-10-08 NOTE — Assessment & Plan Note (Signed)
For carotid dopplers

## 2013-10-08 NOTE — Addendum Note (Signed)
Addended by: Sharon Seller B on: 10/08/2013 02:59 PM   Modules accepted: Orders

## 2013-10-08 NOTE — Assessment & Plan Note (Signed)

## 2013-10-08 NOTE — Patient Instructions (Addendum)
You had the Prevnar pneumonia shot today  We will consider the pneumovax shot next year  Please call [employer] regarding your copay for the Shingles shot  Please continue all other medications as before, and refills have been done if requested. Please have the pharmacy call with any other refills you may need.  Please continue your efforts at being more active, low cholesterol diet, and weight control.  You are otherwise up to date with prevention measures today.  No further lab work needed today  You will be contacted regarding the referral for: carotid dopplers  Please return in 1 year for your yearly visit, or sooner if needed, with Lab testing done 3-5 days before

## 2013-10-08 NOTE — Progress Notes (Signed)
Pre visit review using our clinic review tool, if applicable. No additional management support is needed unless otherwise documented below in the visit note. 

## 2013-10-08 NOTE — Progress Notes (Signed)
Subjective:    Patient ID: Lauren [name], female    DOB: [birth year], 68 y.o.   MRN: [id number]  HPI  Here for wellness and f/u;  Overall doing ok;  Pt denies CP, worsening SOB, DOE, wheezing, orthopnea, PND, worsening LE edema, palpitations, dizziness or syncope.  Pt denies neurological change such as new headache, facial or extremity weakness.  Pt denies polydipsia, polyuria, or low sugar symptoms. Pt states overall good compliance with treatment and medications, good tolerability, and has been trying to follow lower cholesterol diet.  Pt denies worsening depressive symptoms, suicidal ideation or panic. No fever, night sweats, wt loss, loss of appetite, or other constitutional symptoms.  Pt states good ability with ADL's, has low fall risk, home safety reviewed and adequate, no other significant changes in hearing or vision, and only occasionally active with exercise.  Plans to try do more. Had mult labs in [place] and mar this year already with normal LDL.  Did have lifeline screening with noting mild left carotid stenosis Past Medical History  Diagnosis Date  . Depression   . Diverticulitis   . Diverticulosis   . Anxiety   . Psoriasis   . Frequent headaches   . Cervical disc disease   . Impaired glucose tolerance 10/08/2013   Past Surgical History  Procedure Laterality Date  . Cholecystectomy    . Cervical fusion      approx 1980  . Total abdominal hysterectomy  1998    Fibroid    reports that she has never smoked. She has never used smokeless tobacco. She reports that she drinks alcohol. She reports that she does not use illicit drugs. family history includes Alcohol abuse in her other; Alcoholism in her father; Arthritis in her other; Cancer in her other; Cervical cancer in her mother; Diabetes in her brother and mother; Heart attack in her mother; Heart disease in her mother and other; Hypertension in her other; Lung cancer in her father; Throat cancer in her brother. Allergies    Allergen Reactions  . Celebrex [Celecoxib] Itching and Rash   Current Outpatient Prescriptions on File Prior to Visit  Medication Sig Dispense Refill  . aspirin 81 MG tablet Take 81 mg by mouth daily.      . calcium carbonate (TUMS - DOSED IN MG ELEMENTAL CALCIUM) 500 MG chewable tablet Chew 1 tablet by mouth daily.      Marland Kitchen escitalopram (LEXAPRO) 10 MG tablet Take 10 mg by mouth daily.      Marland Kitchen zolpidem (AMBIEN) 5 MG tablet Take 5 mg by mouth at bedtime as needed for sleep.       No current facility-administered medications on file prior to visit.   Review of Systems Constitutional: Negative for increased diaphoresis, other activity, appetite or other siginficant weight change  HENT: Negative for worsening hearing loss, ear pain, facial swelling, mouth sores and neck stiffness.   Eyes: Negative for other worsening pain, redness or visual disturbance.  Respiratory: Negative for shortness of breath and wheezing.   Cardiovascular: Negative for chest pain and palpitations.  Gastrointestinal: Negative for diarrhea, blood in stool, abdominal distention or other pain Genitourinary: Negative for hematuria, flank pain or change in urine volume.  Musculoskeletal: Negative for myalgias or other joint complaints.  Skin: Negative for color change and wound.  Neurological: Negative for syncope and numbness. other than noted Hematological: Negative for adenopathy. or other swelling Psychiatric/Behavioral: Negative for hallucinations, self-injury, decreased concentration or other worsening agitation.  s    Objective:  Physical Exam BP 118/70  Pulse 80  Temp(Src) 98.4 F (36.9 C) (Oral)  Ht 5\' 4"  (1.626 m)  Wt 143 lb (64.864 kg)  BMI 24.53 kg/m2  SpO2 95% VS noted,  Constitutional: Pt is oriented to person, place, and time. Appears well-developed and well-nourished.  Head: Normocephalic and atraumatic.  Right Ear: External ear normal.  Left Ear: External ear normal.  Nose: Nose normal.   Mouth/Throat: Oropharynx is clear and moist.  Eyes: Conjunctivae and EOM are normal. Pupils are equal, round, and reactive to light.  Neck: Normal range of motion. Neck supple. No JVD present. No tracheal deviation present.  Cardiovascular: Normal rate, regular rhythm, normal heart sounds and intact distal pulses.   Pulmonary/Chest: Effort normal and breath sounds without rales or wheezing  Abdominal: Soft. Bowel sounds are normal. NT. No HSM  Musculoskeletal: Normal range of motion. Exhibits no edema.  Lymphadenopathy:  Has no cervical adenopathy.  Neurological: Pt is alert and oriented to person, place, and time. Pt has normal reflexes. No cranial nerve deficit. Motor grossly intact Skin: Skin is warm and dry. No rash noted.  Psychiatric:  Has normal mood and affect. Behavior is normal.     Assessment & Plan:

## 2013-11-04 ENCOUNTER — Telehealth: Payer: Self-pay

## 2013-11-04 DIAGNOSIS — Z0000 Encounter for general adult medical examination without abnormal findings: Secondary | ICD-10-CM

## 2013-11-04 DIAGNOSIS — I6529 Occlusion and stenosis of unspecified carotid artery: Secondary | ICD-10-CM

## 2013-11-04 NOTE — Telephone Encounter (Signed)
Computer states may 2015 labs ordered have not been done  Please ask pt if she had her labs done may 2015; of not they are have been ordered and ready for her to be done  Last LDL chol was 81 with jan 2015 lipids, which is ok  I will re-order the carotid dopplers  Stanton Kidney - to note the carotid doppler order

## 2013-11-04 NOTE — Telephone Encounter (Signed)
Pt states that during her last visit dr. Jenny Reichmann discussed prescribing her cholesterol medication, and a referral for a carotid doppler was placed.  Pt has not received a call with the appointment for the doppler, and also wanted to know about the cholesterol medication.

## 2013-11-04 NOTE — Telephone Encounter (Signed)
Called left message to call back 

## 2013-11-04 NOTE — Telephone Encounter (Signed)
Patient informed of MD instructions and referral to carotid.

## 2013-12-08 ENCOUNTER — Other Ambulatory Visit: Payer: Self-pay | Admitting: Obstetrics and Gynecology

## 2013-12-08 NOTE — Telephone Encounter (Signed)
Last AEX: 06/11/13 Last refill: 07/20/13 # 30, 0rf Current AEX: 06/12/14  Please advise

## 2013-12-09 ENCOUNTER — Telehealth: Payer: Self-pay

## 2013-12-09 NOTE — Telephone Encounter (Signed)
Message copied by Gerda Diss on Tue Dec 09, 2013 11:18 AM ------      Message from: [place], [place]: Tue Dec 09, 2013 10:48 AM      Regarding: Please call in the patient's Rx for Ambien 5 mg       Larene Beach,            I signed the  order you placed for Ambien 5 mg po q hs prn insomnia, #30, RF none.            This however cannot be e prescribed.            Please call it in to the patient's pharmacy and make a note in [place].            Thanks! ------

## 2013-12-09 NOTE — Telephone Encounter (Signed)
rx called in to [facility].  Encounter closed

## 2013-12-15 ENCOUNTER — Telehealth: Payer: Self-pay | Admitting: Internal Medicine

## 2013-12-15 DIAGNOSIS — R42 Dizziness and giddiness: Secondary | ICD-10-CM

## 2013-12-15 NOTE — Telephone Encounter (Signed)
Done, thanks

## 2013-12-15 NOTE — Telephone Encounter (Signed)
Message copied by Biagio Borg on Mon Dec 15, 2013  5:10 PM ------      Message from: Lelon Frohlich P      Created: Mon Dec 15, 2013  3:58 PM      Regarding: carotid duplex order       Dr. Jenny Reichmann,      Please change order for carotid to VAS 01. Thank you! ------

## 2013-12-23 ENCOUNTER — Ambulatory Visit (HOSPITAL_COMMUNITY): Payer: Medicare Other | Attending: Internal Medicine | Admitting: Cardiology

## 2013-12-23 DIAGNOSIS — I6529 Occlusion and stenosis of unspecified carotid artery: Secondary | ICD-10-CM

## 2013-12-23 DIAGNOSIS — R42 Dizziness and giddiness: Secondary | ICD-10-CM | POA: Insufficient documentation

## 2013-12-23 NOTE — Progress Notes (Signed)
Carotid duplex performed 

## 2014-01-02 ENCOUNTER — Other Ambulatory Visit: Payer: Self-pay

## 2014-01-02 DIAGNOSIS — Z1231 Encounter for screening mammogram for malignant neoplasm of breast: Secondary | ICD-10-CM

## 2014-01-29 ENCOUNTER — Telehealth: Payer: Self-pay | Admitting: Internal Medicine

## 2014-01-29 DIAGNOSIS — R7302 Impaired glucose tolerance (oral): Secondary | ICD-10-CM

## 2014-01-29 MED ORDER — ZOLPIDEM TARTRATE 5 MG PO TABS: ORAL_TABLET | ORAL | Status: DC

## 2014-01-29 NOTE — Telephone Encounter (Signed)
Faxed hardcopy for Zolpidem to [facility].  Called the patient informed of MD instructions.  The patient did verbalize understanding and stated she would come to the lab on Monday.

## 2014-01-29 NOTE — Telephone Encounter (Signed)
Pt request refill for [facility]pidem [facility]. Please advise. Pt also request Dr. Jenny Reichmann to send in one touch ultra 25 test stripes as well, pt stated that she trying to check her on blood sugar due to family history.

## 2014-01-29 NOTE — Telephone Encounter (Signed)
Joiner for [place]  Unfortunately I cannot order the strips due to [place] only, pt would need to have a diagnosis of DM  Ok for Hgba1c  - to be done at pt convenience, as she did have a slightly elevated blood sugar mar 2015

## 2014-02-02 ENCOUNTER — Other Ambulatory Visit (INDEPENDENT_AMBULATORY_CARE_PROVIDER_SITE_OTHER): Payer: Medicare Other

## 2014-02-02 DIAGNOSIS — R7309 Other abnormal glucose: Secondary | ICD-10-CM

## 2014-02-02 DIAGNOSIS — R7302 Impaired glucose tolerance (oral): Secondary | ICD-10-CM

## 2014-02-02 LAB — HEMOGLOBIN A1C: HEMOGLOBIN A1C: 5.8 % (ref 4.6–6.5)

## 2014-02-04 ENCOUNTER — Ambulatory Visit: Payer: Medicare Other

## 2014-02-11 ENCOUNTER — Ambulatory Visit
Admission: RE | Admit: 2014-02-11 | Discharge: 2014-02-11 | Disposition: A | Discharge status: Home / Self Care | Payer: Medicare Other | Source: Ambulatory Visit

## 2014-02-11 DIAGNOSIS — Z1231 Encounter for screening mammogram for malignant neoplasm of breast: Secondary | ICD-10-CM

## 2014-03-09 ENCOUNTER — Telehealth: Payer: Self-pay | Admitting: Internal Medicine

## 2014-03-09 ENCOUNTER — Other Ambulatory Visit (INDEPENDENT_AMBULATORY_CARE_PROVIDER_SITE_OTHER): Payer: Medicare Other

## 2014-03-09 DIAGNOSIS — Z79899 Other long term (current) drug therapy: Secondary | ICD-10-CM

## 2014-03-09 DIAGNOSIS — Z0000 Encounter for general adult medical examination without abnormal findings: Secondary | ICD-10-CM

## 2014-03-09 DIAGNOSIS — E7439 Other disorders of intestinal carbohydrate absorption: Secondary | ICD-10-CM

## 2014-03-09 DIAGNOSIS — R7302 Impaired glucose tolerance (oral): Secondary | ICD-10-CM

## 2014-03-09 LAB — LIPID PANEL
CHOL/HDL RATIO: 5
CHOLESTEROL: 133 mg/dL (ref 0–200)
HDL: 27.3 mg/dL — ABNORMAL LOW (ref >39.00)
NONHDL: 105.7
Triglycerides: 375 mg/dL — ABNORMAL HIGH (ref 0.0–149.0)
VLDL: 75 mg/dL — AB (ref 0.0–40.0)

## 2014-03-09 LAB — URINALYSIS, ROUTINE W REFLEX MICROSCOPIC
Bilirubin Urine: NEGATIVE
Hgb urine dipstick: NEGATIVE
Ketones, ur: NEGATIVE
Nitrite: NEGATIVE
Specific Gravity, Urine: 1.02 (ref 1.000–1.030)
Total Protein, Urine: NEGATIVE
Urine Glucose: NEGATIVE
Urobilinogen, UA: 0.2 (ref 0.0–1.0)
pH: 6 (ref 5.0–8.0)

## 2014-03-09 LAB — CBC WITH DIFFERENTIAL/PLATELET
Basophils Absolute: 0 10*3/uL (ref 0.0–0.1)
Basophils Relative: 0.8 % (ref 0.0–3.0)
EOS PCT: 2.2 % (ref 0.0–5.0)
Eosinophils Absolute: 0.1 10*3/uL (ref 0.0–0.7)
HCT: 36.8 % (ref 36.0–46.0)
HEMOGLOBIN: 12.1 g/dL (ref 12.0–15.0)
Lymphocytes Relative: 31.5 % (ref 12.0–46.0)
Lymphs Abs: 1.8 10*3/uL (ref 0.7–4.0)
MCHC: 32.9 g/dL (ref 30.0–36.0)
MCV: 94 fl (ref 78.0–100.0)
MONO ABS: 0.4 10*3/uL (ref 0.1–1.0)
Monocytes Relative: 7.4 % (ref 3.0–12.0)
NEUTROS PCT: 58.1 % (ref 43.0–77.0)
Neutro Abs: 3.3 10*3/uL (ref 1.4–7.7)
Platelets: 246 10*3/uL (ref 150.0–400.0)
RBC: 3.91 Mil/uL (ref 3.87–5.11)
RDW: 14.3 % (ref 11.5–15.5)
WBC: 5.7 10*3/uL (ref 4.0–10.5)

## 2014-03-09 LAB — HEPATIC FUNCTION PANEL
ALT: 31 U/L (ref 0–35)
AST: 28 U/L (ref 0–37)
Albumin: 3.5 g/dL (ref 3.5–5.2)
Alkaline Phosphatase: 138 U/L — ABNORMAL HIGH (ref 39–117)
BILIRUBIN DIRECT: 0.1 mg/dL (ref 0.0–0.3)
TOTAL PROTEIN: 7.4 g/dL (ref 6.0–8.3)
Total Bilirubin: 0.7 mg/dL (ref 0.2–1.2)

## 2014-03-09 LAB — BASIC METABOLIC PANEL
BUN: 14 mg/dL (ref 6–23)
CHLORIDE: 105 meq/L (ref 96–112)
CO2: 28 meq/L (ref 19–32)
Calcium: 9.2 mg/dL (ref 8.4–10.5)
Creatinine, Ser: 0.8 mg/dL (ref 0.4–1.2)
GFR: 80.19 mL/min (ref >60.00)
GLUCOSE: 100 mg/dL — AB (ref 70–99)
POTASSIUM: 4.2 meq/L (ref 3.5–5.1)
Sodium: 143 mEq/L (ref 135–145)

## 2014-03-09 LAB — TSH: TSH: 5.47 u[IU]/mL — ABNORMAL HIGH (ref 0.35–4.50)

## 2014-03-09 LAB — HEMOGLOBIN A1C: Hgb A1c MFr Bld: 5.8 % (ref 4.6–6.5)

## 2014-03-09 NOTE — Telephone Encounter (Signed)
Pt called in wanted results of labs that she had 2 weeks.  She was requesting a phone call to find out what they were     Best number [phone]

## 2014-03-09 NOTE — Telephone Encounter (Signed)
Notified pt md release results to mychart. She stated she no loner no her password to [employer]. Gave her md response & mychart support # for her to get password...[name]

## 2014-03-10 LAB — LDL CHOLESTEROL, DIRECT: Direct LDL: 58.5 mg/dL

## 2014-03-12 ENCOUNTER — Telehealth: Payer: Self-pay | Admitting: Obstetrics and Gynecology

## 2014-03-12 NOTE — Telephone Encounter (Signed)
Dr cx/rs lmtcb to rs

## 2014-03-13 ENCOUNTER — Other Ambulatory Visit: Payer: Self-pay | Admitting: Obstetrics and Gynecology

## 2014-03-16 NOTE — Telephone Encounter (Signed)
Last AEX and refill 06/11/13 #90/3R Next appt 06/18/14   Please advise.

## 2014-03-23 ENCOUNTER — Encounter: Payer: Self-pay | Admitting: Internal Medicine

## 2014-05-10 ENCOUNTER — Emergency Department (HOSPITAL_COMMUNITY)
Admission: EM | Admit: 2014-05-10 | Discharge: 2014-05-10 | Disposition: A | Discharge status: Home / Self Care | Payer: Medicare Other | Attending: Emergency Medicine | Admitting: Emergency Medicine

## 2014-05-10 ENCOUNTER — Encounter (HOSPITAL_COMMUNITY): Payer: Self-pay | Admitting: *Deleted

## 2014-05-10 DIAGNOSIS — Z79899 Other long term (current) drug therapy: Secondary | ICD-10-CM | POA: Diagnosis not present

## 2014-05-10 DIAGNOSIS — F419 Anxiety disorder, unspecified: Secondary | ICD-10-CM | POA: Insufficient documentation

## 2014-05-10 DIAGNOSIS — Z7982 Long term (current) use of aspirin: Secondary | ICD-10-CM | POA: Diagnosis not present

## 2014-05-10 DIAGNOSIS — Z8719 Personal history of other diseases of the digestive system: Secondary | ICD-10-CM | POA: Insufficient documentation

## 2014-05-10 DIAGNOSIS — Z872 Personal history of diseases of the skin and subcutaneous tissue: Secondary | ICD-10-CM | POA: Diagnosis not present

## 2014-05-10 DIAGNOSIS — Z8679 Personal history of other diseases of the circulatory system: Secondary | ICD-10-CM | POA: Insufficient documentation

## 2014-05-10 DIAGNOSIS — M79672 Pain in left foot: Secondary | ICD-10-CM | POA: Insufficient documentation

## 2014-05-10 DIAGNOSIS — F329 Major depressive disorder, single episode, unspecified: Secondary | ICD-10-CM | POA: Diagnosis not present

## 2014-05-10 MED ORDER — DIAZEPAM 5 MG PO TABS
5.0000 mg | ORAL_TABLET | Freq: Once | ORAL | Status: AC
  Administered 2014-05-10: 5 mg via ORAL
  Filled 2014-05-10: qty 1

## 2014-05-10 MED ORDER — KETOROLAC TROMETHAMINE 60 MG/2ML IM SOLN
60.0000 mg | Freq: Once | INTRAMUSCULAR | Status: AC
  Administered 2014-05-10: 60 mg via INTRAMUSCULAR
  Filled 2014-05-10: qty 2

## 2014-05-10 NOTE — ED Notes (Signed)
Pt reports onset this afternoon of a severe cramp to her left foot, denies swelling or injury to foot.

## 2014-05-10 NOTE — Discharge Instructions (Signed)
You were evaluated in the ED today for your foot pain. There does not appear to be an emergent cause for your pain at this time. You reported feeling better after administration of medications in the ED. You may follow-up with primary care for further evaluation and management of your symptoms. Return to ED for worsening symptoms. Fevers, numbness or weakness, increased pain, redness or swelling to the area

## 2014-05-10 NOTE — ED Provider Notes (Signed)
CSN: [id number]     Arrival date & time 05/10/14  1902 History  This chart was scribed for non-physician practitioner, Comer Locket, PA-C working with Richarda Blade, MD by Frederich Balding, ED scribe. This patient was seen in room TR08C/TR08C and the patient's care was started at 8:21 PM.    Chief Complaint  Patient presents with  . Foot Pain   The history is provided by the patient. No language interpreter was used.    HPI Comments: Lauren [name] is a 68 y.o. female who presents to the Emergency Department complaining of sudden onset cramping left foot pain that started today at 4 PM. Denies fall or injury. Certain movements worsen the pain. Pt has taken a hot shower, massaged her foot and used Bengay with no relief. She has also taken an arthritis BC powder with no relief and oxycodone with some relief. Denies rashes, leg pain, redness or swelling in legs, cp, SOB, abd pain, numbness or weakness  Past Medical History  Diagnosis Date  . Depression   . Diverticulitis   . Diverticulosis   . Anxiety   . Psoriasis   . Frequent headaches   . Cervical disc disease   . Impaired glucose tolerance 10/08/2013  . Carotid stenosis 10/08/2013   Past Surgical History  Procedure Laterality Date  . Cholecystectomy    . Cervical fusion      approx 1980  . Total abdominal hysterectomy  1998    Fibroid   Family History  Problem Relation Age of Onset  . Diabetes Brother   . Heart disease Mother   . Diabetes Mother   . Cervical cancer Mother   . Heart attack Mother   . Lung cancer Father   . Alcoholism Father   . Throat cancer Brother   . Cancer Other     lung cancer  . Heart disease Other   . Hypertension Other   . Arthritis Other   . Alcohol abuse Other    History  Substance Use Topics  . Smoking status: Never Smoker   . Smokeless tobacco: Never Used  . Alcohol Use: Yes     Comment: social   OB History    Gravida Para Term Preterm AB TAB SAB Ectopic Multiple Living   2 2  2       2      Review of Systems  Musculoskeletal: Positive for arthralgias.  Skin: Negative for rash.  All other systems reviewed and are negative.  Allergies  Celebrex  Home Medications   Prior to Admission medications   Medication Sig Start Date End Date Taking? Authorizing Provider  aspirin 81 MG tablet Take 81 mg by mouth daily.    Historical Provider, MD  calcium carbonate (TUMS - DOSED IN MG ELEMENTAL CALCIUM) 500 MG chewable tablet Chew 1 tablet by mouth daily.    Historical Provider, MD  escitalopram (LEXAPRO) 10 MG tablet Take 10 mg by mouth daily.    Historical Provider, MD  zolpidem (AMBIEN) 5 MG tablet TAKE 1 TABLET BY MOUTH AT BEDTIME AS NEEDED FOR SLEEP 01/29/14   Biagio Borg, MD   BP 132/56 mmHg  Pulse 71  Temp(Src) 98.1 F (36.7 C) (Oral)  Resp 18  SpO2 96%   Physical Exam  Constitutional: She is oriented to person, place, and time. She appears well-developed and well-nourished. No distress.  HENT:  Head: Normocephalic and atraumatic.  Mouth/Throat: Oropharynx is clear and moist.  Eyes: Conjunctivae and EOM are normal.  Neck:  Neck supple. No tracheal deviation present.  Cardiovascular: Normal rate, regular rhythm, normal heart sounds and intact distal pulses.   Pulmonary/Chest: Effort normal and breath sounds normal. No respiratory distress.  Abdominal: Soft. There is no tenderness.  Musculoskeletal: Normal range of motion.  Tenderness along lateral aspect of left foot diffusely. No lesions or evidence of ulceration. No erythema or edema. Muscle bellies are soft. No tenderness to calf or along deep venous system. Neurovascularly intact.  Neurological: She is alert and oriented to person, place, and time.  CN II-XII grossly intact. Motor Sensation 5/5 in all extremities.  Skin: Skin is warm and dry.  Psychiatric: She has a normal mood and affect. Her behavior is normal.  Nursing note and vitals reviewed.   ED Course  Procedures (including critical care  time)  DIAGNOSTIC STUDIES: Oxygen Saturation is 98% on RA, normal by my interpretation.    COORDINATION OF CARE: 8:29 PM-Discussed treatment plan which includes a muscle relaxer with pt at bedside and pt agreed to plan.   Labs Review Labs Reviewed - No data to display  Imaging Review No results found.   EKG Interpretation None     . Meds given in ED:  Medications  ketorolac (TORADOL) injection 60 mg (60 mg Intramuscular Given 05/10/14 2047)  diazepam (VALIUM) tablet 5 mg (5 mg Oral Given 05/10/14 2046)    Discharge Medication List as of 05/10/2014  9:51 PM     Filed Vitals:   05/10/14 1911 05/10/14 2037 05/10/14 2154  BP: 128/78 134/63 132/56  Pulse: 80 76 71  Temp: 98.1 F (36.7 C)    TempSrc: Oral    Resp: 18 15 18   SpO2: 98% 96% 96%     MDM  Vitals stable - WNL -afebrile Pt resting comfortably in ED. reports she feels much better after medications administered in ED. PE--not concerning further acute or emergent pathology. Patient is neurovascularly intact, distal pulses intact. Muscle bellies are soft. No calf tenderness or tenderness along the deep venous system. No unilateral leg swelling, erythema.  DDX--low concern for hemarthrosis, septic joint, vascular compromise, DVT, necrotizing fasciitis  Discussed f/u with PCP and return precautions, pt very amenable to plan. Patient stable, in good condition and is appropriate for discharge   Final diagnoses:  Foot pain, left     I personally performed the services described in this documentation, which was scribed in my presence. The recorded information has been reviewed and is accurate.  Viona Gilmore [place], PA-C 05/11/14 [place], MD 05/12/14 [phone]

## 2014-06-05 ENCOUNTER — Other Ambulatory Visit: Payer: Self-pay | Admitting: Obstetrics and Gynecology

## 2014-06-05 NOTE — Telephone Encounter (Signed)
Medication refill request: Lexapro 10 mg  Last AEX:  06/11/13 with Dr. Quincy Simmonds  Next AEX: 07/29/14 with Dr. Quincy Simmonds Last MMG (if hormonal medication request): N/A Refill authorized: #90/0 rfs, please advise.

## 2014-06-12 ENCOUNTER — Ambulatory Visit: Payer: Medicare Other | Admitting: Obstetrics and Gynecology

## 2014-06-18 ENCOUNTER — Ambulatory Visit: Payer: Medicare Other | Admitting: Nurse Practitioner

## 2014-06-18 ENCOUNTER — Ambulatory Visit: Payer: Medicare Other | Admitting: Obstetrics and Gynecology

## 2014-07-29 ENCOUNTER — Ambulatory Visit: Payer: Medicare Other | Admitting: Nurse Practitioner

## 2014-07-30 ENCOUNTER — Encounter: Payer: Self-pay | Admitting: Nurse Practitioner

## 2014-07-30 ENCOUNTER — Ambulatory Visit (INDEPENDENT_AMBULATORY_CARE_PROVIDER_SITE_OTHER): Payer: Medicare Other | Admitting: Nurse Practitioner

## 2014-07-30 VITALS — BP 136/70 | HR 72 | Ht 64.25 in | Wt 148.0 lb

## 2014-07-30 DIAGNOSIS — Z01419 Encounter for gynecological examination (general) (routine) without abnormal findings: Secondary | ICD-10-CM | POA: Diagnosis not present

## 2014-07-30 DIAGNOSIS — M8580 Other specified disorders of bone density and structure, unspecified site: Secondary | ICD-10-CM

## 2014-07-30 DIAGNOSIS — E559 Vitamin D deficiency, unspecified: Secondary | ICD-10-CM

## 2014-07-30 NOTE — Patient Instructions (Signed)

## 2014-07-30 NOTE — Progress Notes (Signed)
Patient ID: Lauren [name], female   DOB: [birth year], 69 y.o.   MRN: [id number] 69 y.o. G70P2002 Married  Caucasian Fe here for annual exam.  No new diagnosis.    Patient's last menstrual period was 05/22/1996.     ERT 1998 -04/2006     Sexually active: Yes.    The current method of family planning is status post hysterectomy.    Exercising: No.  The patient does not participate in regular exercise at present. Smoker:  no  Health Maintenance: Pap:  03/17/09, negative  MMG:  02/11/14, 3D, Bi-Rads 1:  Negative Colonoscopy:  08/2009, repeat in 10 years BMD:   09/2006 T Score: spine -0.6; hip -1.2 TDaP:  2015  Labs:  PCP   reports that she has never smoked. She has never used smokeless tobacco. She reports that she drinks alcohol. She reports that she does not use illicit drugs.  Past Medical History  Diagnosis Date  . Depression   . Diverticulitis   . Diverticulosis   . Anxiety   . Psoriasis   . Frequent headaches   . Cervical disc disease   . Carotid stenosis 10/08/2013  . Impaired glucose tolerance 10/08/2013    Past Surgical History  Procedure Laterality Date  . Cholecystectomy    . Cervical fusion      approx 1980  . Total abdominal hysterectomy  1998    Fibroid    Current Outpatient Prescriptions  Medication Sig Dispense Refill  . aspirin 81 MG tablet Take 81 mg by mouth daily.    . calcium carbonate (TUMS - DOSED IN MG ELEMENTAL CALCIUM) 500 MG chewable tablet Chew 1 tablet by mouth daily.    Marland Kitchen escitalopram (LEXAPRO) 10 MG tablet TAKE 1 TABLET EVERY DAY 90 tablet 0  . zolpidem (AMBIEN) 5 MG tablet TAKE 1 TABLET BY MOUTH AT BEDTIME AS NEEDED FOR SLEEP 30 tablet 5   No current facility-administered medications for this visit.    Family History  Problem Relation Age of Onset  . Diabetes Brother   . Heart disease Mother   . Diabetes Mother   . Cervical cancer Mother   . Heart attack Mother   . Lung cancer Father   . Alcoholism Father   . Throat cancer Brother   .  Cancer Other     lung cancer  . Heart disease Other   . Hypertension Other   . Arthritis Other   . Alcohol abuse Other     ROS:  Pertinent items are noted in HPI.  Otherwise, a comprehensive ROS was negative.  Exam:   BP 136/70 mmHg  Pulse 72  Ht 5' 4.25" (1.632 m)  Wt 148 lb (67.132 kg)  BMI 25.21 kg/m2  LMP 05/22/1996 Height: 5' 4.25" (163.2 cm) Ht Readings from Last 3 Encounters:  07/30/14 5' 4.25" (1.632 m)  10/08/13 5\' 4"  (1.626 m)  07/22/13 5\' 4"  (1.626 m)    General appearance: alert, cooperative and appears stated age Head: Normocephalic, without obvious abnormality, atraumatic Neck: no adenopathy, supple, symmetrical, trachea midline and thyroid normal to inspection and palpation Lungs: clear to auscultation bilaterally Breasts: normal appearance, no masses or tenderness Heart: regular rate and rhythm Abdomen: soft, non-tender; no masses,  no organomegaly Extremities: extremities normal, atraumatic, no cyanosis or edema Skin: Skin color, texture, turgor normal. No rashes or lesions Lymph nodes: Cervical, supraclavicular, and axillary nodes normal. No abnormal inguinal nodes palpated Neurologic: Grossly normal   Pelvic: External genitalia:  no lesions  Urethra:  normal appearing urethra with no masses, tenderness or lesions              Bartholin's and Skene's: normal                 Vagina: normal appearing vagina with normal color and discharge, no lesions              Cervix: absent              Pap taken: No. Bimanual Exam:  Uterus:  uterus absent              Adnexa: no mass, fullness, tenderness               Rectovaginal: Confirms               Anus:  normal sphincter tone, no lesions  Chaperone present:  yes  A:  Well Woman with normal exam  Status post TAH/BSO 1998 secondary to fibroids  Took ERT 1998 - 04/2006  History of Anxiety.  Insomnia.   Vulvar lesions with diagnosis of lichen sclerosis.   Atrophic vaginal  changes.  History of Vit D deficiency    P:   Reviewed health and wellness pertinent to exam  Pap smear not taken today  Mammogram is due 01/2015  Note faxed for BMD  Follow with Vit D - currently on OTC Vit D  Counseled on breast self exam, mammography screening, adequate intake of calcium and vitamin D, diet and exercise, Kegel's exercises return annually or prn  An After Visit Summary was printed and given to the patient.

## 2014-07-31 LAB — VITAMIN D 25 HYDROXY (VIT D DEFICIENCY, FRACTURES): Vit D, 25-Hydroxy: 21 ng/mL — ABNORMAL LOW (ref 30–100)

## 2014-08-03 ENCOUNTER — Telehealth: Payer: Self-pay | Admitting: *Deleted

## 2014-08-03 NOTE — Telephone Encounter (Signed)
-----   Message from Regina Eck, CNM sent at 07/31/2014  1:02 PM EST ----- Notify Vitamin D is low needs protocol

## 2014-08-03 NOTE — Progress Notes (Signed)
Encounter reviewed by Dr. Josefa Half.  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  Treated with Triamcinolone.

## 2014-08-03 NOTE — Telephone Encounter (Signed)
I have attempted to contact this patient by phone with the following results: left message to return call to [place] at [phone] answering machine (home per [facility]).  No personal information given.  [phone] (Home) *Preferred*

## 2014-08-12 ENCOUNTER — Other Ambulatory Visit: Payer: Self-pay | Admitting: Internal Medicine

## 2014-08-12 NOTE — Telephone Encounter (Signed)
Done hardcopy to Cherina  

## 2014-08-13 MED ORDER — VITAMIN D (ERGOCALCIFEROL) 1.25 MG (50000 UNIT) PO CAPS: 50000.0000 [IU] | ORAL_CAPSULE | ORAL | Status: DC

## 2014-08-13 NOTE — Addendum Note (Signed)
Addended by: Graylon Good on: 08/13/2014 02:21 PM   Modules accepted: Orders, SmartSet

## 2014-08-13 NOTE — Telephone Encounter (Signed)
Done

## 2014-08-27 ENCOUNTER — Ambulatory Visit
Admission: RE | Admit: 2014-08-27 | Discharge: 2014-08-27 | Disposition: A | Discharge status: Home / Self Care | Payer: Medicare Other | Source: Ambulatory Visit | Attending: Nurse Practitioner | Admitting: Nurse Practitioner

## 2014-08-27 DIAGNOSIS — M8580 Other specified disorders of bone density and structure, unspecified site: Secondary | ICD-10-CM

## 2014-08-30 ENCOUNTER — Inpatient Hospital Stay (HOSPITAL_COMMUNITY)
Admission: EM | Admit: 2014-08-30 | Discharge: 2014-08-31 | DRG: 552 | Disposition: A | Discharge status: Home / Self Care | Payer: Medicare Other | Attending: Neurological Surgery | Admitting: Neurological Surgery

## 2014-08-30 ENCOUNTER — Encounter (HOSPITAL_COMMUNITY): Payer: Self-pay | Admitting: Neurology

## 2014-08-30 ENCOUNTER — Emergency Department (HOSPITAL_COMMUNITY): Payer: Medicare Other

## 2014-08-30 DIAGNOSIS — S12400A Unspecified displaced fracture of fifth cervical vertebra, initial encounter for closed fracture: Secondary | ICD-10-CM | POA: Diagnosis not present

## 2014-08-30 DIAGNOSIS — Z981 Arthrodesis status: Secondary | ICD-10-CM | POA: Diagnosis not present

## 2014-08-30 DIAGNOSIS — Y92009 Unspecified place in unspecified non-institutional (private) residence as the place of occurrence of the external cause: Secondary | ICD-10-CM

## 2014-08-30 DIAGNOSIS — F329 Major depressive disorder, single episode, unspecified: Secondary | ICD-10-CM | POA: Diagnosis present

## 2014-08-30 DIAGNOSIS — S129XXA Fracture of neck, unspecified, initial encounter: Secondary | ICD-10-CM

## 2014-08-30 DIAGNOSIS — S0990XA Unspecified injury of head, initial encounter: Secondary | ICD-10-CM | POA: Diagnosis present

## 2014-08-30 DIAGNOSIS — Z7982 Long term (current) use of aspirin: Secondary | ICD-10-CM | POA: Diagnosis not present

## 2014-08-30 DIAGNOSIS — Z90710 Acquired absence of both cervix and uterus: Secondary | ICD-10-CM

## 2014-08-30 DIAGNOSIS — S12500A Unspecified displaced fracture of sixth cervical vertebra, initial encounter for closed fracture: Secondary | ICD-10-CM | POA: Diagnosis present

## 2014-08-30 DIAGNOSIS — Z79899 Other long term (current) drug therapy: Secondary | ICD-10-CM

## 2014-08-30 DIAGNOSIS — M542 Cervicalgia: Secondary | ICD-10-CM | POA: Diagnosis not present

## 2014-08-30 DIAGNOSIS — W07XXXA Fall from chair, initial encounter: Secondary | ICD-10-CM | POA: Diagnosis present

## 2014-08-30 DIAGNOSIS — W19XXXA Unspecified fall, initial encounter: Secondary | ICD-10-CM

## 2014-08-30 DIAGNOSIS — Z791 Long term (current) use of non-steroidal anti-inflammatories (NSAID): Secondary | ICD-10-CM

## 2014-08-30 DIAGNOSIS — Z9181 History of falling: Secondary | ICD-10-CM | POA: Diagnosis not present

## 2014-08-30 DIAGNOSIS — Z888 Allergy status to other drugs, medicaments and biological substances status: Secondary | ICD-10-CM

## 2014-08-30 LAB — URINALYSIS, ROUTINE W REFLEX MICROSCOPIC
BILIRUBIN URINE: NEGATIVE
Glucose, UA: NEGATIVE mg/dL
HGB URINE DIPSTICK: NEGATIVE
Ketones, ur: 15 mg/dL — AB
NITRITE: NEGATIVE
Protein, ur: NEGATIVE mg/dL
SPECIFIC GRAVITY, URINE: 1.014 (ref 1.005–1.030)
Urobilinogen, UA: 1 mg/dL (ref 0.0–1.0)
pH: 6.5 (ref 5.0–8.0)

## 2014-08-30 LAB — I-STAT CHEM 8, ED
BUN: 13 mg/dL (ref 6–23)
Calcium, Ion: 1.11 mmol/L — ABNORMAL LOW (ref 1.13–1.30)
Chloride: 104 mmol/L (ref 96–112)
Creatinine, Ser: 0.7 mg/dL (ref 0.50–1.10)
Glucose, Bld: 91 mg/dL (ref 70–99)
HCT: 35 % — ABNORMAL LOW (ref 36.0–46.0)
Hemoglobin: 11.9 g/dL — ABNORMAL LOW (ref 12.0–15.0)
Potassium: 3.8 mmol/L (ref 3.5–5.1)
SODIUM: 140 mmol/L (ref 135–145)
TCO2: 23 mmol/L (ref 0–100)

## 2014-08-30 LAB — URINE MICROSCOPIC-ADD ON

## 2014-08-30 MED ORDER — ESCITALOPRAM OXALATE 10 MG PO TABS
10.0000 mg | ORAL_TABLET | Freq: Every day | ORAL | Status: DC
  Administered 2014-08-30 – 2014-08-31 (×2): 10 mg via ORAL
  Filled 2014-08-30 (×2): qty 1

## 2014-08-30 MED ORDER — MORPHINE SULFATE 2 MG/ML IJ SOLN
1.0000 mg | INTRAMUSCULAR | Status: DC | PRN
  Administered 2014-08-30: 4 mg via INTRAVENOUS
  Filled 2014-08-30: qty 2

## 2014-08-30 MED ORDER — FENTANYL CITRATE 0.05 MG/ML IJ SOLN
50.0000 ug | Freq: Once | INTRAMUSCULAR | Status: AC
  Administered 2014-08-30: 50 ug via INTRAVENOUS
  Filled 2014-08-30: qty 2

## 2014-08-30 MED ORDER — MAGNESIUM CITRATE PO SOLN
1.0000 | Freq: Once | ORAL | Status: AC | PRN
  Filled 2014-08-30: qty 296

## 2014-08-30 MED ORDER — ONDANSETRON HCL 4 MG/2ML IJ SOLN
4.0000 mg | INTRAMUSCULAR | Status: DC | PRN
Start: 2014-08-30 — End: 2014-08-31

## 2014-08-30 MED ORDER — SENNA 8.6 MG PO TABS
1.0000 | ORAL_TABLET | Freq: Two times a day (BID) | ORAL | Status: DC
  Administered 2014-08-31: 8.6 mg via ORAL
  Filled 2014-08-30 (×2): qty 1

## 2014-08-30 MED ORDER — SODIUM CHLORIDE 0.9 % IV SOLN
250.0000 mL | INTRAVENOUS | Status: DC
Start: 2014-08-30 — End: 2014-08-31

## 2014-08-30 MED ORDER — METHOCARBAMOL 500 MG PO TABS
500.0000 mg | ORAL_TABLET | Freq: Four times a day (QID) | ORAL | Status: DC | PRN
  Administered 2014-08-30 – 2014-08-31 (×2): 500 mg via ORAL
  Filled 2014-08-30 (×2): qty 1

## 2014-08-30 MED ORDER — BISACODYL 5 MG PO TBEC: 5.0000 mg | DELAYED_RELEASE_TABLET | Freq: Every day | ORAL | Status: DC | PRN

## 2014-08-30 MED ORDER — IOHEXOL 300 MG/ML  SOLN
100.0000 mL | Freq: Once | INTRAMUSCULAR | Status: AC | PRN
  Administered 2014-08-30: 100 mL via INTRAVENOUS

## 2014-08-30 MED ORDER — SODIUM CHLORIDE 0.9 % IJ SOLN
3.0000 mL | Freq: Two times a day (BID) | INTRAMUSCULAR | Status: DC
  Administered 2014-08-30 – 2014-08-31 (×2): 3 mL via INTRAVENOUS

## 2014-08-30 MED ORDER — SODIUM CHLORIDE 0.9 % IJ SOLN: 3.0000 mL | INTRAMUSCULAR | Status: DC | PRN

## 2014-08-30 MED ORDER — POLYETHYLENE GLYCOL 3350 17 G PO PACK: 17.0000 g | PACK | Freq: Every day | ORAL | Status: DC | PRN

## 2014-08-30 MED ORDER — ZOLPIDEM TARTRATE 5 MG PO TABS
5.0000 mg | ORAL_TABLET | Freq: Every evening | ORAL | Status: DC | PRN
  Administered 2014-08-31: 5 mg via ORAL
  Filled 2014-08-30: qty 1

## 2014-08-30 MED ORDER — MENTHOL 3 MG MT LOZG: 1.0000 | LOZENGE | OROMUCOSAL | Status: DC | PRN

## 2014-08-30 MED ORDER — PHENOL 1.4 % MT LIQD: 1.0000 | OROMUCOSAL | Status: DC | PRN

## 2014-08-30 MED ORDER — OXYCODONE-ACETAMINOPHEN 5-325 MG PO TABS
1.0000 | ORAL_TABLET | ORAL | Status: DC | PRN
  Administered 2014-08-30 – 2014-08-31 (×2): 2 via ORAL
  Filled 2014-08-30 (×2): qty 2

## 2014-08-30 MED ORDER — ACETAMINOPHEN 650 MG RE SUPP: 650.0000 mg | RECTAL | Status: DC | PRN

## 2014-08-30 MED ORDER — METHOCARBAMOL 1000 MG/10ML IJ SOLN: 500.0000 mg | Freq: Four times a day (QID) | INTRAVENOUS | Status: DC | PRN

## 2014-08-30 MED ORDER — POTASSIUM CHLORIDE IN NACL 20-0.9 MEQ/L-% IV SOLN
INTRAVENOUS | Status: DC
  Administered 2014-08-30: 21:00:00 via INTRAVENOUS
  Filled 2014-08-30: qty 1000

## 2014-08-30 MED ORDER — ACETAMINOPHEN 325 MG PO TABS: 650.0000 mg | ORAL_TABLET | ORAL | Status: DC | PRN

## 2014-08-30 NOTE — ED Notes (Signed)
MD at bedside assessing pt back/neck. Pt log rolled; tender on palpation cervical spine "a little bit." Pt has large bruises on vaginal area and sacrum.

## 2014-08-30 NOTE — ED Notes (Addendum)
Per ems- pt was at home standing on a folding chair was reaching to hang a plant and lost her balance on chair falling backwards and hitting the left side of her head/neck on the arm of the couch. Immediately felt pain to neck on left side. Reports 30 years ago had fusion to this area. Also last week had fall, off 15ft ladder with hip pain to left side, did not get checked. Has bruise to left groin. Pt is a x 4. BP 170/90 initially, 130/84 recent, HR 84. Pt is fully immobilized. Denies numbness or tingling. Can move all extremities.

## 2014-08-30 NOTE — ED Notes (Signed)
Patient transported to CT 

## 2014-08-30 NOTE — H&P (Signed)
Subjective:   Patient is a 69 y.o. female seen regarding a cervical spine fracture after a fall. The patient first presented with complaints of neck pain. Onset of symptoms was Today when she fell from standing position within a chair. She struck her head on the side of a couch. No loss of consciousness. She denies arm pain or numbness tingling or weakness. She has had significant neck discomfort. She has received 2 doses of fentanyl in the ER. Symptoms are not progressive. Onset was related to a fall. The pain is described as aching and occurs all day. The pain is rated severe, and is located at the base of the neck. Symptoms are exacerbated by none, and are relieved by none. History positive for ACDF C4-5 C5-6 C6-7 by Dr. Laverta Baltimore in the remote past (without plate). Previous work up includes CT of cervical spine, results: Fracture through the posterior elements of C5-6 which appear to be flexion distraction. No canal stenosis or hematoma noted. Head CT was negative.  Past Medical History  Diagnosis Date  . Depression   . Diverticulitis   . Diverticulosis   . Anxiety   . Psoriasis   . Frequent headaches   . Cervical disc disease   . Carotid stenosis 10/08/2013  . Impaired glucose tolerance 10/08/2013    Past Surgical History  Procedure Laterality Date  . Cholecystectomy    . Cervical fusion      approx 1980  . Total abdominal hysterectomy  1998    Fibroid    Allergies  Allergen Reactions  . Celebrex [Celecoxib] Itching and Rash    History  Substance Use Topics  . Smoking status: Never Smoker   . Smokeless tobacco: Never Used  . Alcohol Use: Yes     Comment: social    Family History  Problem Relation Age of Onset  . Diabetes Brother   . Heart disease Mother   . Diabetes Mother   . Cervical cancer Mother   . Heart attack Mother   . Lung cancer Father   . Alcoholism Father   . Throat cancer Brother   . Cancer Other     lung cancer  . Heart disease Other   . Hypertension Other    . Arthritis Other   . Alcohol abuse Other    Prior to Admission medications   Medication Sig Start Date End Date Taking? Authorizing Provider  aspirin EC 325 MG tablet Take 325 mg by mouth daily.   Yes Historical Provider, MD  calcium carbonate (OS-CAL) 1250 (500 CA) MG chewable tablet Chew 2 tablets by mouth daily.   Yes Historical Provider, MD  escitalopram (LEXAPRO) 10 MG tablet TAKE 1 TABLET EVERY DAY 06/05/14  Yes Brook E Yisroel Ramming, MD  ibuprofen (ADVIL,MOTRIN) 600 MG tablet Take 600 mg by mouth 2 (two) times daily with a meal. 08/03/14  Yes Historical Provider, MD  polyethylene glycol (MIRALAX / GLYCOLAX) packet Take 17 g by mouth daily.   Yes Historical Provider, MD  Vitamin D, Ergocalciferol, (DRISDOL) 50000 UNITS CAPS capsule Take 1 capsule (50,000 Units total) by mouth every 7 (seven) days. Patient taking differently: Take 50,000 Units by mouth every 7 (seven) days. wednesday 08/13/14  Yes Antonietta Barcelona, FNP  zolpidem (AMBIEN) 5 MG tablet TAKE 1 TABLET BY MOUTH AT BEDTIME AS NEEDED FOR SLEEP 08/12/14  Yes Biagio Borg, MD     Review of Systems  Positive ROS: neg  All other systems have been reviewed and were otherwise negative  with the exception of those mentioned in the HPI and as above.  Objective: Vital signs in last 24 hours: Temp:  [98 F (36.7 C)-98.1 F (36.7 C)] 98.1 F (36.7 C) (04/10 1314) Pulse Rate:  [73-89] 89 (04/10 1615) Resp:  [10-21] 21 (04/10 1615) BP: (125-148)/(64-76) 132/76 mmHg (04/10 1615) SpO2:  [98 %-100 %] 100 % (04/10 1615) Weight:  [147 lb (66.679 kg)] 147 lb (66.679 kg) (04/10 1231)  General Appearance: Alert, cooperative, no distress, appears stated age Head: Normocephalic, without obvious abnormality, atraumatic Eyes: PERRL, conjunctiva/corneas clear, EOM's intact, fundi benign, both eyes      Ears: Normal TM's and external ear canals, both ears Throat: Lips, mucosa, and tongue normal; teeth and gums normal Neck: Supple,  symmetrical, trachea midline, no adenopathy; thyroid: No enlargement/tenderness/nodules; no carotid bruit or JVD Back: Symmetric, no curvature, ROM normal, no CVA tenderness Lungs: Clear to auscultation bilaterally, respirations unlabored Heart: Regular rate and rhythm, S1 and S2 normal, no murmur, rub or gallop Abdomen: Soft, non-tender, bowel sounds active all four quadrants, no masses, no organomegaly Extremities: Extremities normal, atraumatic, no cyanosis or edema Pulses: 2+ and symmetric all extremities Skin: Skin color, texture, turgor normal, no rashes or lesions  NEUROLOGIC:   Mental status: A&O x4, no aphasia, good AS, fund of knowledge and memory Motor Exam - grossly normal Sensory Exam - grossly normal Reflexes: Coordination - grossly normal Gait - grossly normal Balance - grossly normal Cranial Nerves: I: smell Not tested  II: visual acuity  OS: na    OD: na  II: visual fields Full to confrontation  II: pupils Equal, round, reactive to light  III,VII: ptosis None  III,IV,VI: extraocular muscles  Full ROM  V: mastication Normal  V: facial light touch sensation  Normal  V,VII: corneal reflex  Present  VII: facial muscle function - upper  Normal  VII: facial muscle function - lower Normal  VIII: hearing Not tested  IX: soft palate elevation  Normal  IX,X: gag reflex Present  XI: trapezius strength  5/5  XI: sternocleidomastoid strength 5/5  XI: neck flexion strength  5/5  XII: tongue strength  Normal    Data Review Lab Results  Component Value Date   WBC 5.7 03/09/2014   HGB 11.9* 08/30/2014   HCT 35.0* 08/30/2014   MCV 94.0 03/09/2014   PLT 246.0 03/09/2014   Lab Results  Component Value Date   NA 140 08/30/2014   K 3.8 08/30/2014   CL 104 08/30/2014   CO2 28 03/09/2014   BUN 13 08/30/2014   CREATININE 0.70 08/30/2014   GLUCOSE 91 08/30/2014   No results found for: INR, PROTIME   CT scan was reviewed as well as the  report  Assessment/Plan: 69 year old female who suffered a fall today and has suffered a flexion distraction injury involving the posterior elements at C5-6. I suspect this also involves the anterior construct though it is difficult to see on the CT scan. She likely has a brittle spine, almost dish-like appearance, and this potentially could be an unstable injury. We will admit her and control her pain. Within get upright x-rays were potential flexion-extension views to look for stability. I suspect she is going to need a posterior cervical fusion lateral mass instrumentation C4-C7, but I think we need to gather some more information before making that determination. I have explained this to her her family in detail.   Kimoni Pagliarulo S 08/30/2014 4:28 PM

## 2014-08-30 NOTE — ED Provider Notes (Signed)
CSN: [id number]     Arrival date & time 08/30/14  1226 History   First MD Initiated Contact with Patient 08/30/14 1234     Chief Complaint  Patient presents with  . Fall  . Neck Pain     (Consider location/radiation/quality/duration/timing/severity/associated sxs/prior Treatment) HPI Comments: Patient complains of left-sided neck pain after losing her balance while standing on a chair trying to plan. She fell and oriented to the left side of her neck on the arm of the couch. Did not lose consciousness. Immediately she felt pain in the left side of her neck. She had a fusion to this area previously. She does not take any blood thinners. She denies any focal weakness, numbness or tingling. No bowel or bladder incontinence. She also fell one week ago off a 7 foot ladder landing on her left side. She was not evaluated. She has extensive bruising to her abdomen and buttocks. She denies any chest pain or shortness of breath.  Patient is a 69 y.o. female presenting with neck pain. The history is provided by the patient and the EMS personnel.  Neck Pain Associated symptoms: headaches   Associated symptoms: no chest pain, no fever, no photophobia and no weakness     Past Medical History  Diagnosis Date  . Depression   . Diverticulitis   . Diverticulosis   . Anxiety   . Psoriasis   . Frequent headaches   . Cervical disc disease   . Carotid stenosis 10/08/2013  . Impaired glucose tolerance 10/08/2013   Past Surgical History  Procedure Laterality Date  . Cholecystectomy    . Cervical fusion      approx 1980  . Total abdominal hysterectomy  1998    Fibroid   Family History  Problem Relation Age of Onset  . Diabetes Brother   . Heart disease Mother   . Diabetes Mother   . Cervical cancer Mother   . Heart attack Mother   . Lung cancer Father   . Alcoholism Father   . Throat cancer Brother   . Cancer Other     lung cancer  . Heart disease Other   . Hypertension Other   . Arthritis  Other   . Alcohol abuse Other    History  Substance Use Topics  . Smoking status: Never Smoker   . Smokeless tobacco: Never Used  . Alcohol Use: Yes     Comment: social   OB History    Gravida Para Term Preterm AB TAB SAB Ectopic Multiple Living   2 2 2       2      Review of Systems  Constitutional: Negative for fever, activity change, appetite change and fatigue.  HENT: Negative for congestion and rhinorrhea.   Eyes: Negative for photophobia and visual disturbance.  Respiratory: Negative for cough, chest tightness and shortness of breath.   Cardiovascular: Negative for chest pain.  Gastrointestinal: Positive for abdominal pain. Negative for nausea and vomiting.  Endocrine: Negative for polyuria.  Genitourinary: Negative for dysuria, hematuria, vaginal bleeding and vaginal discharge.  Musculoskeletal: Positive for myalgias, back pain, arthralgias and neck pain.  Skin: Positive for wound. Negative for rash.  Neurological: Positive for headaches. Negative for dizziness, weakness and light-headedness.  A complete 10 system review of systems was obtained and all systems are negative except as noted in the HPI and PMH.      Allergies  Celebrex  Home Medications   Prior to Admission medications   Medication Sig Start Date  End Date Taking? Authorizing Provider  aspirin EC 325 MG tablet Take 325 mg by mouth daily.   Yes Historical Provider, MD  calcium carbonate (OS-CAL) 1250 (500 CA) MG chewable tablet Chew 2 tablets by mouth daily.   Yes Historical Provider, MD  escitalopram (LEXAPRO) 10 MG tablet TAKE 1 TABLET EVERY DAY 06/05/14  Yes Brook E Yisroel Ramming, MD  ibuprofen (ADVIL,MOTRIN) 600 MG tablet Take 600 mg by mouth 2 (two) times daily with a meal. 08/03/14  Yes Historical Provider, MD  polyethylene glycol (MIRALAX / GLYCOLAX) packet Take 17 g by mouth daily.   Yes Historical Provider, MD  Vitamin D, Ergocalciferol, (DRISDOL) 50000 UNITS CAPS capsule Take 1 capsule (50,000  Units total) by mouth every 7 (seven) days. Patient taking differently: Take 50,000 Units by mouth every 7 (seven) days. wednesday 08/13/14  Yes Antonietta Barcelona, FNP  zolpidem (AMBIEN) 5 MG tablet TAKE 1 TABLET BY MOUTH AT BEDTIME AS NEEDED FOR SLEEP 08/12/14  Yes Biagio Borg, MD   BP 132/76 mmHg  Pulse 89  Temp(Src) 98.1 F (36.7 C) (Oral)  Resp 21  Wt 147 lb (66.679 kg)  SpO2 100%  LMP 05/22/1996 Physical Exam  Constitutional: She is oriented to person, place, and time. She appears well-developed and well-nourished. No distress.  HENT:  Head: Normocephalic and atraumatic.  Mouth/Throat: Oropharynx is clear and moist. No oropharyngeal exudate.  Eyes: Conjunctivae and EOM are normal. Pupils are equal, round, and reactive to light.  Neck: Normal range of motion. Neck supple.  Cervical collar in place. Tenderness in the midline and left side. No step-off.   Cardiovascular: Normal rate, regular rhythm, normal heart sounds and intact distal pulses.   No murmur heard. Pulmonary/Chest: Effort normal and breath sounds normal. No respiratory distress.  Abdominal: Soft. There is no tenderness. There is no rebound and no guarding.  Chaperone present. Bruising to mons pubis.   Genitourinary:  Extensive bruising to bilateral buttocks and right posterior thigh.  Musculoskeletal: Normal range of motion. She exhibits no edema or tenderness.  No T or L-spine tenderness  Neurological: She is alert and oriented to person, place, and time. No cranial nerve deficit. She exhibits normal muscle tone. Coordination normal.  No ataxia on finger to nose bilaterally. No pronator drift. 5/5 strength throughout. CN 2-12 intact. Equal grip strength. Sensation intact.  Skin: Skin is warm.  Psychiatric: She has a normal mood and affect. Her behavior is normal.  Nursing note and vitals reviewed.   ED Course  Procedures (including critical care time) Labs Review Labs Reviewed  URINALYSIS, ROUTINE W REFLEX  MICROSCOPIC - Abnormal; Notable for the following:    Ketones, ur 15 (*)    Leukocytes, UA TRACE (*)    All other components within normal limits  I-STAT CHEM 8, ED - Abnormal; Notable for the following:    Calcium, Ion 1.11 (*)    Hemoglobin 11.9 (*)    HCT 35.0 (*)    All other components within normal limits  URINE MICROSCOPIC-ADD ON    Imaging Review Ct Head Wo Contrast  08/30/2014   CLINICAL DATA:  Fall from chair. Fall to floor. No loss of consciousness. Cervicalgia/neck pain. Head trauma.  EXAM: CT HEAD WITHOUT CONTRAST  CT CERVICAL SPINE WITHOUT CONTRAST  TECHNIQUE: Multidetector CT imaging of the head and cervical spine was performed following the standard protocol without intravenous contrast. Multiplanar CT image reconstructions of the cervical spine were also generated.  COMPARISON:  None.  FINDINGS: CT  HEAD FINDINGS  No mass lesion, mass effect, midline shift, hydrocephalus, hemorrhage. No territorial ischemia or acute infarction. The calvarium is intact. Scout images appear within normal limits.  CT CERVICAL SPINE FINDINGS  Alignment: Mild levoconvex torticollis.  No spondylolisthesis.  Craniocervical junction: Occipital condyles are intact. C1 ring intact. Odontoid intact. Accessory ossicles are present dorsal to the C1 lateral masses.  Vertebrae: C4 through C7 solid fusion. Probable previous corpectomy and strut graft.  There is an atypical fracture at C5-C6. The fracture extends through the pars interarticularis of the C6 posterior elements on the RIGHT and through the inferior C5 heart particular process and C6 superior articular process on the LEFT. The vertebral body fracture is more difficult to identify and but there is some cortical step-off deformity anterior to the C6 vertebra. Little if any paravertebral phlegmon is present.  Paraspinal soft tissues: Carotid atherosclerosis.  Lung apices: Normal.  Severe adjacent segment disease is present at C3-C4 with broad-based disc  osteophyte complex producing mild central stenosis. RIGHT foraminal stenosis potentially affecting the RIGHT C4 nerve. There is no epidural hematoma. Osseous ridging is present at the fusion levels. Adjacent segment disease is also present at C7-T1, with mild LEFT foraminal encroachment associated with facet arthrosis and spurring.  IMPRESSION: 1. Nondisplaced atypical fractures of the fused C5-C6 segment. The fractures are more visible in the posterior elements than across the vertebral body. Critical Value/emergent results were called by telephone at the time of interpretation on 08/30/2014 at 3:23 pm to Dr. Ezequiel Essex , who verbally acknowledged these results. 2. C4 through C7 cervical fusion with probable C5-C6 corpectomy and strut graft. 3. Adjacent segment disease at C3-C4 and C7-T1. 4. Negative CT head.   Electronically Signed   By: Dereck Ligas M.D.   On: 08/30/2014 15:23   Ct Chest Wo Contrast  08/30/2014   CLINICAL DATA:  Fall.  EXAM: CT CHEST WITHOUT CONTRAST  TECHNIQUE: Multidetector CT imaging of the chest was performed following the standard protocol without IV contrast.  COMPARISON:  None.  FINDINGS: No pneumothorax or pleural effusion is noted. No acute pulmonary disease is noted. No mediastinal mass or adenopathy is seen. Atherosclerotic calcifications of thoracic aorta are noted without aneurysm formation. No significant osseous abnormality is noted.  IMPRESSION: No significant abnormality seen in the chest on this unenhanced scan.   Electronically Signed   By: Marijo Conception, M.D.   On: 08/30/2014 15:55   Ct Cervical Spine Wo Contrast  08/30/2014   CLINICAL DATA:  Fall from chair. Fall to floor. No loss of consciousness. Cervicalgia/neck pain. Head trauma.  EXAM: CT HEAD WITHOUT CONTRAST  CT CERVICAL SPINE WITHOUT CONTRAST  TECHNIQUE: Multidetector CT imaging of the head and cervical spine was performed following the standard protocol without intravenous contrast. Multiplanar CT  image reconstructions of the cervical spine were also generated.  COMPARISON:  None.  FINDINGS: CT HEAD FINDINGS  No mass lesion, mass effect, midline shift, hydrocephalus, hemorrhage. No territorial ischemia or acute infarction. The calvarium is intact. Scout images appear within normal limits.  CT CERVICAL SPINE FINDINGS  Alignment: Mild levoconvex torticollis.  No spondylolisthesis.  Craniocervical junction: Occipital condyles are intact. C1 ring intact. Odontoid intact. Accessory ossicles are present dorsal to the C1 lateral masses.  Vertebrae: C4 through C7 solid fusion. Probable previous corpectomy and strut graft.  There is an atypical fracture at C5-C6. The fracture extends through the pars interarticularis of the C6 posterior elements on the RIGHT and through the inferior C5 heart  particular process and C6 superior articular process on the LEFT. The vertebral body fracture is more difficult to identify and but there is some cortical step-off deformity anterior to the C6 vertebra. Little if any paravertebral phlegmon is present.  Paraspinal soft tissues: Carotid atherosclerosis.  Lung apices: Normal.  Severe adjacent segment disease is present at C3-C4 with broad-based disc osteophyte complex producing mild central stenosis. RIGHT foraminal stenosis potentially affecting the RIGHT C4 nerve. There is no epidural hematoma. Osseous ridging is present at the fusion levels. Adjacent segment disease is also present at C7-T1, with mild LEFT foraminal encroachment associated with facet arthrosis and spurring.  IMPRESSION: 1. Nondisplaced atypical fractures of the fused C5-C6 segment. The fractures are more visible in the posterior elements than across the vertebral body. Critical Value/emergent results were called by telephone at the time of interpretation on 08/30/2014 at 3:23 pm to Dr. Ezequiel Essex , who verbally acknowledged these results. 2. C4 through C7 cervical fusion with probable C5-C6 corpectomy and  strut graft. 3. Adjacent segment disease at C3-C4 and C7-T1. 4. Negative CT head.   Electronically Signed   By: Dereck Ligas M.D.   On: 08/30/2014 15:23   Ct Abdomen Pelvis W Contrast  08/30/2014   CLINICAL DATA:  Patient fell from standing on chair this morning with neck and back pain.  EXAM: CT ABDOMEN AND PELVIS WITH CONTRAST  TECHNIQUE: Multidetector CT imaging of the abdomen and pelvis was performed using the standard protocol following bolus administration of intravenous contrast.  CONTRAST:  169mL OMNIPAQUE IOHEXOL 300 MG/ML  SOLN  COMPARISON:  Report of CT abdomen pelvis 07/20/2013.  FINDINGS: Lung bases are within normal.  Abdominal images demonstrate evidence of previous cholecystectomy. The liver, spleen, pancreas and adrenal glands are normal. Kidneys normal in size without hydronephrosis or nephrolithiasis. There is a sub cm hypodensity over the lower pole left kidney likely a cyst but too small to characterize. Ureters are normal. The appendix is retrocecal and otherwise normal. There is mild calcified plaque over the abdominal aorta.  There is diverticulosis of the colon most prominent over the sigmoid colon.  The bladder and rectum are normal. There are mild degenerative changes of the spine and hips. No acute fracture.  IMPRESSION: No acute findings in the abdomen/ pelvis.  Moderate diverticulosis of the colon.  Sub cm left renal cortical hypodensity too small to characterize but likely a cyst.   Electronically Signed   By: Marin Olp M.D.   On: 08/30/2014 15:11   Dg Pelvis Portable  08/30/2014   CLINICAL DATA:  Fall while standing on a chair.  EXAM: PORTABLE PELVIS 1-2 VIEWS  COMPARISON:  None.  FINDINGS: There is no evidence of pelvic fracture or diastasis. No pelvic bone lesions are seen.  IMPRESSION: Negative.   Electronically Signed   By: Kerby Moors M.D.   On: 08/30/2014 14:10   Dg Chest Portable 1 View  08/30/2014   CLINICAL DATA:  Fall with injury.  EXAM: PORTABLE CHEST - 1  VIEW  COMPARISON:  None.  FINDINGS: Left lower lobe opacity is noted - question atelectasis versus airspace disease or consolidation.  The cardiomediastinal silhouette is otherwise unremarkable.  There is no evidence of pulmonary edema or pneumothorax.  No acute bony abnormalities are identified.  IMPRESSION: Left lower lobe opacity - question atelectasis versus airspace disease/consolidation. Consider lateral view when gas able.   Electronically Signed   By: Margarette Canada M.D.   On: 08/30/2014 14:04     EKG  Interpretation   Date/Time:  Sunday August 30 2014 13:12:59 EDT Ventricular Rate:  72 PR Interval:  182 QRS Duration: 94 QT Interval:  393 QTC Calculation: 430 R Axis:   77 Text Interpretation:  Sinus rhythm No significant change was found  Confirmed by Wyvonnia Dusky  MD, Helvi Royals [phone]) on 08/30/2014 1:20:58 PM      MDM   Final diagnoses:  Fall  Cervical spine fracture, initial encounter   Fall with head and neck pain. Also fall last week with bruising to the abdomen and buttocks. Equal strength throughout. No weakness, numbness or tingling.   CXR without PTX, questionable LLL opacity, pelvis xray negative.  CT head negative. CT C-spine shows fracture through patient's fusion at C5-C6 involving body of vertebrae and posterior elements. Patient's EMS c-collar is replaced with an [employer] account planner. She remains neurovascularly intact with equal strength and sensation throughout. This was reviewed with Dr. Ronnald Ramp of neurosurgery who states this fracture may be unstable and he will come evaluate.  Additional imaging is obtained given patient's ecchymosis of her abdomen and buttocks. Her CT abdomen is negative. CT chest was obtained given her abnormal chest x-ray. This was reviewed with Dr. Nyoka Cowden. There is no evidence of pneumothorax, pleural effusion, hemothorax or other thoracic normality. Patient has no oxygen requirement and no dyspnea.  Vitals are stable. Patient in no distress and has  apparently no other significant traumatic injuries. She is stable for neurosurgical evaluation.    Ezequiel Essex, MD 08/30/14 [phone]

## 2014-08-31 ENCOUNTER — Inpatient Hospital Stay (HOSPITAL_COMMUNITY): Payer: Medicare Other

## 2014-08-31 MED ORDER — OXYCODONE-ACETAMINOPHEN 5-325 MG PO TABS: 1.0000 | ORAL_TABLET | ORAL | Status: DC | PRN

## 2014-08-31 NOTE — Progress Notes (Signed)
Discharge instructions given. Pt verbalized understanding and all questions were answered.  

## 2014-08-31 NOTE — Progress Notes (Signed)
Orthopedic Tech Progress Note Patient Details:  Lauren [name] 1945-07-22 [id number]  Patient ID: [name], female   DOB: 07/24/1945, 69 y.o.   MRN: [id number] Called in bio-tech brace order; spoke with Dolores Lory, Kristain Hu 08/31/2014, 9:33 AM

## 2014-08-31 NOTE — Discharge Summary (Signed)
Physician Discharge Summary  Patient ID: Lauren [name] MRN: [id number] DOB/AGE: 08-01-1945 69 y.o.  Admit date: 08/30/2014 Discharge date: 08/31/2014  Admission Diagnoses: cervical fracture, closed   Discharge Diagnoses: same   Discharged Condition: good  Hospital Course: The patient was admitted on 08/30/2014 with a cervical spine fracture. The hospital course was routine. There were no complications. Pt had appropriate neck soreness. No complaints of arm pain or new N/T/W. F/E plain films showed no instability. The patient remained afebrile with stable vital signs, and tolerated a regular diet. The patient continued to increase activities, and pain was well controlled with oral pain medications.   Consults: None  Significant Diagnostic Studies:  Results for orders placed or performed during the hospital encounter of 08/30/14  Urinalysis, Routine w reflex microscopic  Result Value Ref Range   Color, Urine YELLOW YELLOW   APPearance CLEAR CLEAR   Specific Gravity, Urine 1.014 1.005 - 1.030   pH 6.5 5.0 - 8.0   Glucose, UA NEGATIVE NEGATIVE mg/dL   Hgb urine dipstick NEGATIVE NEGATIVE   Bilirubin Urine NEGATIVE NEGATIVE   Ketones, ur 15 (A) NEGATIVE mg/dL   Protein, ur NEGATIVE NEGATIVE mg/dL   Urobilinogen, UA 1.0 0.0 - 1.0 mg/dL   Nitrite NEGATIVE NEGATIVE   Leukocytes, UA TRACE (A) NEGATIVE  Urine microscopic-add on  Result Value Ref Range   Squamous Epithelial / LPF RARE RARE   WBC, UA 0-2 <3 WBC/hpf  I-stat chem 8, ed  Result Value Ref Range   Sodium 140 135 - 145 mmol/L   Potassium 3.8 3.5 - 5.1 mmol/L   Chloride 104 96 - 112 mmol/L   BUN 13 6 - 23 mg/dL   Creatinine, Ser 0.70 0.50 - 1.10 mg/dL   Glucose, Bld 91 70 - 99 mg/dL   Calcium, Ion 1.11 (L) 1.13 - 1.30 mmol/L   TCO2 23 0 - 100 mmol/L   Hemoglobin 11.9 (L) 12.0 - 15.0 g/dL   HCT 35.0 (L) 36.0 - 46.0 %    Ct Head Wo Contrast  08/30/2014   CLINICAL DATA:  Fall from chair. Fall to floor. No loss of  consciousness. Cervicalgia/neck pain. Head trauma.  EXAM: CT HEAD WITHOUT CONTRAST  CT CERVICAL SPINE WITHOUT CONTRAST  TECHNIQUE: Multidetector CT imaging of the head and cervical spine was performed following the standard protocol without intravenous contrast. Multiplanar CT image reconstructions of the cervical spine were also generated.  COMPARISON:  None.  FINDINGS: CT HEAD FINDINGS  No mass lesion, mass effect, midline shift, hydrocephalus, hemorrhage. No territorial ischemia or acute infarction. The calvarium is intact. Scout images appear within normal limits.  CT CERVICAL SPINE FINDINGS  Alignment: Mild levoconvex torticollis.  No spondylolisthesis.  Craniocervical junction: Occipital condyles are intact. C1 ring intact. Odontoid intact. Accessory ossicles are present dorsal to the C1 lateral masses.  Vertebrae: C4 through C7 solid fusion. Probable previous corpectomy and strut graft.  There is an atypical fracture at C5-C6. The fracture extends through the pars interarticularis of the C6 posterior elements on the RIGHT and through the inferior C5 heart particular process and C6 superior articular process on the LEFT. The vertebral body fracture is more difficult to identify and but there is some cortical step-off deformity anterior to the C6 vertebra. Little if any paravertebral phlegmon is present.  Paraspinal soft tissues: Carotid atherosclerosis.  Lung apices: Normal.  Severe adjacent segment disease is present at C3-C4 with broad-based disc osteophyte complex producing mild central stenosis. RIGHT foraminal stenosis potentially affecting the  RIGHT C4 nerve. There is no epidural hematoma. Osseous ridging is present at the fusion levels. Adjacent segment disease is also present at C7-T1, with mild LEFT foraminal encroachment associated with facet arthrosis and spurring.  IMPRESSION: 1. Nondisplaced atypical fractures of the fused C5-C6 segment. The fractures are more visible in the posterior elements  than across the vertebral body. Critical Value/emergent results were called by telephone at the time of interpretation on 08/30/2014 at 3:23 pm to Dr. Ezequiel Essex , who verbally acknowledged these results. 2. C4 through C7 cervical fusion with probable C5-C6 corpectomy and strut graft. 3. Adjacent segment disease at C3-C4 and C7-T1. 4. Negative CT head.   Electronically Signed   By: Dereck Ligas M.D.   On: 08/30/2014 15:23   Ct Chest Wo Contrast  08/30/2014   CLINICAL DATA:  Fall.  EXAM: CT CHEST WITHOUT CONTRAST  TECHNIQUE: Multidetector CT imaging of the chest was performed following the standard protocol without IV contrast.  COMPARISON:  None.  FINDINGS: No pneumothorax or pleural effusion is noted. No acute pulmonary disease is noted. No mediastinal mass or adenopathy is seen. Atherosclerotic calcifications of thoracic aorta are noted without aneurysm formation. No significant osseous abnormality is noted.  IMPRESSION: No significant abnormality seen in the chest on this unenhanced scan.   Electronically Signed   By: Marijo Conception, M.D.   On: 08/30/2014 15:55   Ct Cervical Spine Wo Contrast  08/30/2014   CLINICAL DATA:  Fall from chair. Fall to floor. No loss of consciousness. Cervicalgia/neck pain. Head trauma.  EXAM: CT HEAD WITHOUT CONTRAST  CT CERVICAL SPINE WITHOUT CONTRAST  TECHNIQUE: Multidetector CT imaging of the head and cervical spine was performed following the standard protocol without intravenous contrast. Multiplanar CT image reconstructions of the cervical spine were also generated.  COMPARISON:  None.  FINDINGS: CT HEAD FINDINGS  No mass lesion, mass effect, midline shift, hydrocephalus, hemorrhage. No territorial ischemia or acute infarction. The calvarium is intact. Scout images appear within normal limits.  CT CERVICAL SPINE FINDINGS  Alignment: Mild levoconvex torticollis.  No spondylolisthesis.  Craniocervical junction: Occipital condyles are intact. C1 ring intact. Odontoid  intact. Accessory ossicles are present dorsal to the C1 lateral masses.  Vertebrae: C4 through C7 solid fusion. Probable previous corpectomy and strut graft.  There is an atypical fracture at C5-C6. The fracture extends through the pars interarticularis of the C6 posterior elements on the RIGHT and through the inferior C5 heart particular process and C6 superior articular process on the LEFT. The vertebral body fracture is more difficult to identify and but there is some cortical step-off deformity anterior to the C6 vertebra. Little if any paravertebral phlegmon is present.  Paraspinal soft tissues: Carotid atherosclerosis.  Lung apices: Normal.  Severe adjacent segment disease is present at C3-C4 with broad-based disc osteophyte complex producing mild central stenosis. RIGHT foraminal stenosis potentially affecting the RIGHT C4 nerve. There is no epidural hematoma. Osseous ridging is present at the fusion levels. Adjacent segment disease is also present at C7-T1, with mild LEFT foraminal encroachment associated with facet arthrosis and spurring.  IMPRESSION: 1. Nondisplaced atypical fractures of the fused C5-C6 segment. The fractures are more visible in the posterior elements than across the vertebral body. Critical Value/emergent results were called by telephone at the time of interpretation on 08/30/2014 at 3:23 pm to Dr. Ezequiel Essex , who verbally acknowledged these results. 2. C4 through C7 cervical fusion with probable C5-C6 corpectomy and strut graft. 3. Adjacent segment disease at  C3-C4 and C7-T1. 4. Negative CT head.   Electronically Signed   By: Dereck Ligas M.D.   On: 08/30/2014 15:23   Ct Abdomen Pelvis W Contrast  08/30/2014   CLINICAL DATA:  Patient fell from standing on chair this morning with neck and back pain.  EXAM: CT ABDOMEN AND PELVIS WITH CONTRAST  TECHNIQUE: Multidetector CT imaging of the abdomen and pelvis was performed using the standard protocol following bolus administration of  intravenous contrast.  CONTRAST:  113mL OMNIPAQUE IOHEXOL 300 MG/ML  SOLN  COMPARISON:  Report of CT abdomen pelvis 07/20/2013.  FINDINGS: Lung bases are within normal.  Abdominal images demonstrate evidence of previous cholecystectomy. The liver, spleen, pancreas and adrenal glands are normal. Kidneys normal in size without hydronephrosis or nephrolithiasis. There is a sub cm hypodensity over the lower pole left kidney likely a cyst but too small to characterize. Ureters are normal. The appendix is retrocecal and otherwise normal. There is mild calcified plaque over the abdominal aorta.  There is diverticulosis of the colon most prominent over the sigmoid colon.  The bladder and rectum are normal. There are mild degenerative changes of the spine and hips. No acute fracture.  IMPRESSION: No acute findings in the abdomen/ pelvis.  Moderate diverticulosis of the colon.  Sub cm left renal cortical hypodensity too small to characterize but likely a cyst.   Electronically Signed   By: Marin Olp M.D.   On: 08/30/2014 15:11   Dg Pelvis Portable  08/30/2014   CLINICAL DATA:  Fall while standing on a chair.  EXAM: PORTABLE PELVIS 1-2 VIEWS  COMPARISON:  None.  FINDINGS: There is no evidence of pelvic fracture or diastasis. No pelvic bone lesions are seen.  IMPRESSION: Negative.   Electronically Signed   By: Kerby Moors M.D.   On: 08/30/2014 14:10   Dg Bone Density  08/27/2014   CLINICAL DATA:  Postmenopausal. Followup osteopenia. Prior hysterectomy, oophorectomy at age 9. Patient takes vitamin-D and calcium supplements.  EXAM: DUAL X-RAY ABSORPTIOMETRY (DXA) FOR BONE MINERAL DENSITY  FINDINGS: AP LUMBAR SPINE L1-L4  Bone Mineral Density (BMD):  0.858 g/cm2  Young Adult T-Score:  -1.7  Z-Score:  0.4  Left FEMUR neck  Bone Mineral Density (BMD):  0.605 g/cm2  Young Adult T-Score: -2.2  Z-Score:  -0.4  ASSESSMENT: Patient's diagnostic category is osteopenia/ low bone mass by WHO Criteria.  FRACTURE RISK: Increased   FRAX: Based on the [place] model, the 10 year probability of a major osteoporotic fracture is 13%. The 10 year probability of a hip fracture is 2.5%.  COMPARISON: 10/19/2006:  Lumbar spine: Since the prior study there has been a decrease in bone mineral density by 12.6%.  Total hip: Since the prior study there has been a decrease in bone mineral density by 6.9%.  Effective therapies are available in the form of bisphosphonates, selective estrogen receptor modulators, biologic agents, and hormone replacement therapy (for women). All patients should ensure an adequate intake of dietary calcium (1200 mg daily) and vitamin D (800 IU daily) unless contraindicated.  All treatment decisions require clinical judgment and consideration of individual patient factors, including patient preferences, co-morbidities, previous drug use, risk factors not captured in the FRAX model (e.g., frailty, falls, vitamin D deficiency, increased bone turnover, interval significant decline in bone density) and possible under- or over-estimation of fracture risk by FRAX.  The National Osteoporosis Foundation recommends that FDA-approved medical therapies be considered in postmenopausal women and men age 16 or  older with a:  1. Hip or vertebral (clinical or morphometric) fracture.  2. T-score of -2.5 or lower at the spine or hip.  3. Ten-year fracture probability by FRAX of 3% or greater for hip fracture or 20% or greater for major osteoporotic fracture.  People with diagnosed cases of osteoporosis or at high risk for fracture should have regular bone mineral density tests. For patients eligible for Medicare, routine testing is allowed once every 2 years. The testing frequency can be increased to one year for patients who have rapidly progressing disease, those who are receiving or discontinuing medical therapy to restore bone mass, or have additional risk factors.  [employer] [facility]) Criteria:  Normal:  T-scores from +1.0 to -1.0  Low Bone Mass (Osteopenia): T-scores between -1.0 and -2.5  Osteoporosis: T-scores -2.5 and below  Comparison to Reference Population:  T-score is the key measure used in the diagnosis of osteoporosis and relative risk determination for fracture. It provides a value for bone mass relative to the mean bone mass of a young adult reference population expressed in terms of standard deviation (SD).  Z-score is the age-matched score showing the patient's values compared to a population matched for age, sex, and race. This is also expressed in terms of standard deviation. The patient may have values that compare favorably to the age-matched values and still be at increased risk for fracture.   Electronically Signed   By: Nolon Nations M.D.   On: 08/27/2014 14:22   Dg Chest Portable 1 View  08/30/2014   CLINICAL DATA:  Fall with injury.  EXAM: PORTABLE CHEST - 1 VIEW  COMPARISON:  None.  FINDINGS: Left lower lobe opacity is noted - question atelectasis versus airspace disease or consolidation.  The cardiomediastinal silhouette is otherwise unremarkable.  There is no evidence of pulmonary edema or pneumothorax.  No acute bony abnormalities are identified.  IMPRESSION: Left lower lobe opacity - question atelectasis versus airspace disease/consolidation. Consider lateral view when gas able.   Electronically Signed   By: Margarette Canada M.D.   On: 08/30/2014 14:04   Dg Cerv Spine Flex&ext Only  08/31/2014   CLINICAL DATA:  Posterior neck pain, initial encounter.  EXAM: CERVICAL SPINE - FLEXION AND EXTENSION VIEWS ONLY  COMPARISON:  CT cervical spine 08/30/2014.  FINDINGS: The cervical spine is visualized from the occiput to the cervicothoracic junction, in flexion and extension. Nondisplaced fractures of the views C5-6 segment are better seen on cross-sectional imaging performed yesterday. Approximately 2 mm retrolisthesis of C3 on C4 on the flexion view, which increases to 3 mm on extension.  There is overall reversal of the normal cervical lordosis secondary to C4-7 fusion. Vertebral body height is maintained. Endplate degenerative changes are seen at C3-4 and C7-T1, with slight loss of disc space height. Prevertebral soft tissues are within normal limits.  IMPRESSION: 1. Nondisplaced fractures of the fused C5-6 segment are better seen on cross-sectional imaging performed yesterday. 2. Slight retrolisthesis of C3 on C4, which appears to increase minimally on extension. 3. Reversal of the normal cervical lordosis, secondary to C4-7 fusion. 4. C3-4 and C7-T1 degenerative disc disease.   Electronically Signed   By: Lorin Picket M.D.   On: 08/31/2014 09:30    Antibiotics:  Anti-infectives    None      Discharge Exam: Blood pressure 121/55, pulse 74, temperature 98.2 F (36.8 C), temperature source Oral, resp. rate 16, weight 147 lb (66.679 kg), last menstrual period 05/22/1996, SpO2 94 %. Neurologic:  Grossly normal   Discharge Medications:     Medication List    TAKE these medications        aspirin EC 325 MG tablet  Take 325 mg by mouth daily.     calcium carbonate 1250 (500 CA) MG chewable tablet  Commonly known as:  OS-CAL  Chew 2 tablets by mouth daily.     escitalopram 10 MG tablet  Commonly known as:  LEXAPRO  TAKE 1 TABLET EVERY DAY     ibuprofen 600 MG tablet  Commonly known as:  ADVIL,MOTRIN  Take 600 mg by mouth 2 (two) times daily with a meal.     oxyCODONE-acetaminophen 5-325 MG per tablet  Commonly known as:  PERCOCET/ROXICET  Take 1 tablet by mouth every 4 (four) hours as needed for moderate pain.     polyethylene glycol packet  Commonly known as:  MIRALAX / GLYCOLAX  Take 17 g by mouth daily.     Vitamin D (Ergocalciferol) 50000 UNITS Caps capsule  Commonly known as:  DRISDOL  Take 1 capsule (50,000 Units total) by mouth every 7 (seven) days.     zolpidem 5 MG tablet  Commonly known as:  AMBIEN  TAKE 1 TABLET BY MOUTH AT BEDTIME AS NEEDED  FOR SLEEP        Disposition: home in collar   Final Dx: C5-6 fracture      Discharge Instructions    Call MD for:  difficulty breathing, headache or visual disturbances    Complete by:  As directed      Call MD for:  persistant nausea and vomiting    Complete by:  As directed      Call MD for:  severe uncontrolled pain    Complete by:  As directed      Call MD for:  temperature >100.4    Complete by:  As directed      Diet - low sodium heart healthy    Complete by:  As directed      Discharge instructions    Complete by:  As directed   Wear collar at all times, no strenuous activity     Increase activity slowly    Complete by:  As directed            Follow-up Information    Follow up with Clover Feehan S, MD. Schedule an appointment as soon as possible for a visit in 2 weeks.   Specialty:  Neurosurgery   Contact information:   [address] [place] [address] [place] [address] [phone]        Signed: Eustace Moore 08/31/2014, 1:03 PM

## 2014-08-31 NOTE — Progress Notes (Signed)
Doing well, pain better, no arm pain or NTW. Good strength. Will get F/E today.

## 2014-09-01 ENCOUNTER — Telehealth: Payer: Self-pay | Admitting: *Deleted

## 2014-09-01 NOTE — Progress Notes (Signed)
UR complete.  Jordyn Doane RN, MSN 

## 2014-09-01 NOTE — Telephone Encounter (Signed)
Pt was on tcm list D/C from hospital on 08/31/14 for Cervical Spine fracture. Pt will follow-up with neurosurgery Dr. Heide Spark in 2 wks. Did not make appt with pcp...Lauren [name]

## 2014-09-07 ENCOUNTER — Telehealth: Payer: Self-pay | Admitting: Internal Medicine

## 2014-09-07 NOTE — Telephone Encounter (Signed)
Patient is trying to get some life insurance and they need some information from Dr. Jenny Reichmann. They have been trying to get this since early Feb. Please call patient asap

## 2014-09-08 NOTE — Telephone Encounter (Signed)
Patient has called back in regards.  I did refer patient to Medical Records.

## 2014-09-09 NOTE — Telephone Encounter (Signed)
Pt notified in result note 08/13/14.  Closing encounter.

## 2014-09-11 ENCOUNTER — Other Ambulatory Visit: Payer: Self-pay | Admitting: Obstetrics and Gynecology

## 2014-09-14 NOTE — Telephone Encounter (Addendum)
Medication refill request: Lexapro  Last AEX:  07/30/14 PG Next AEX: 08/02/15 PG Last MMG (if hormonal medication request): 02/11/14 [id number]:Neg Refill authorized: 06/05/14 #[id number]. Today please advise.

## 2014-10-13 ENCOUNTER — Other Ambulatory Visit: Payer: Self-pay | Admitting: Neurological Surgery

## 2014-10-14 ENCOUNTER — Ambulatory Visit (INDEPENDENT_AMBULATORY_CARE_PROVIDER_SITE_OTHER): Payer: Medicare Other | Admitting: Internal Medicine

## 2014-10-14 ENCOUNTER — Other Ambulatory Visit (INDEPENDENT_AMBULATORY_CARE_PROVIDER_SITE_OTHER): Payer: Medicare Other

## 2014-10-14 ENCOUNTER — Encounter: Payer: Self-pay | Admitting: Internal Medicine

## 2014-10-14 VITALS — BP 144/82 | HR 77 | Temp 98.6°F | Wt 142.0 lb

## 2014-10-14 DIAGNOSIS — Z23 Encounter for immunization: Secondary | ICD-10-CM | POA: Diagnosis not present

## 2014-10-14 DIAGNOSIS — R7302 Impaired glucose tolerance (oral): Secondary | ICD-10-CM

## 2014-10-14 DIAGNOSIS — I10 Essential (primary) hypertension: Secondary | ICD-10-CM | POA: Diagnosis not present

## 2014-10-14 DIAGNOSIS — Z0000 Encounter for general adult medical examination without abnormal findings: Secondary | ICD-10-CM | POA: Diagnosis not present

## 2014-10-14 DIAGNOSIS — Z01818 Encounter for other preprocedural examination: Secondary | ICD-10-CM | POA: Insufficient documentation

## 2014-10-14 LAB — CBC WITH DIFFERENTIAL/PLATELET
Basophils Absolute: 0 10*3/uL (ref 0.0–0.1)
Basophils Relative: 0.7 % (ref 0.0–3.0)
EOS PCT: 1.6 % (ref 0.0–5.0)
Eosinophils Absolute: 0.1 10*3/uL (ref 0.0–0.7)
HCT: 37.3 % (ref 36.0–46.0)
HEMOGLOBIN: 12.6 g/dL (ref 12.0–15.0)
Lymphocytes Relative: 26.8 % (ref 12.0–46.0)
Lymphs Abs: 1.6 10*3/uL (ref 0.7–4.0)
MCHC: 33.9 g/dL (ref 30.0–36.0)
MCV: 90.1 fl (ref 78.0–100.0)
MONOS PCT: 9.4 % (ref 3.0–12.0)
Monocytes Absolute: 0.6 10*3/uL (ref 0.1–1.0)
NEUTROS PCT: 61.5 % (ref 43.0–77.0)
Neutro Abs: 3.7 10*3/uL (ref 1.4–7.7)
Platelets: 234 10*3/uL (ref 150.0–400.0)
RBC: 4.14 Mil/uL (ref 3.87–5.11)
RDW: 15.4 % (ref 11.5–15.5)
WBC: 5.9 10*3/uL (ref 4.0–10.5)

## 2014-10-14 LAB — LIPID PANEL
CHOLESTEROL: 162 mg/dL (ref 0–200)
HDL: 34.1 mg/dL — ABNORMAL LOW (ref >39.00)
LDL Cholesterol: 101 mg/dL — ABNORMAL HIGH (ref 0–99)
NONHDL: 127.9
TRIGLYCERIDES: 133 mg/dL (ref 0.0–149.0)
Total CHOL/HDL Ratio: 5
VLDL: 26.6 mg/dL (ref 0.0–40.0)

## 2014-10-14 LAB — URINALYSIS, ROUTINE W REFLEX MICROSCOPIC
BILIRUBIN URINE: NEGATIVE
Hgb urine dipstick: NEGATIVE
KETONES UR: NEGATIVE
LEUKOCYTES UA: NEGATIVE
NITRITE: NEGATIVE
Specific Gravity, Urine: >=1.03 — AB (ref 1.000–1.030)
TOTAL PROTEIN, URINE-UPE24: NEGATIVE
URINE GLUCOSE: NEGATIVE
UROBILINOGEN UA: 0.2 (ref 0.0–1.0)
pH: 6 (ref 5.0–8.0)

## 2014-10-14 LAB — HEPATIC FUNCTION PANEL
ALBUMIN: 4.3 g/dL (ref 3.5–5.2)
ALT: 17 U/L (ref 0–35)
AST: 21 U/L (ref 0–37)
Alkaline Phosphatase: 86 U/L (ref 39–117)
BILIRUBIN TOTAL: 0.8 mg/dL (ref 0.2–1.2)
Bilirubin, Direct: 0.2 mg/dL (ref 0.0–0.3)
TOTAL PROTEIN: 7.3 g/dL (ref 6.0–8.3)

## 2014-10-14 LAB — BASIC METABOLIC PANEL
BUN: 19 mg/dL (ref 6–23)
CO2: 32 meq/L (ref 19–32)
Calcium: 9.5 mg/dL (ref 8.4–10.5)
Chloride: 102 mEq/L (ref 96–112)
Creatinine, Ser: 0.73 mg/dL (ref 0.40–1.20)
GFR: 83.86 mL/min (ref >60.00)
GLUCOSE: 87 mg/dL (ref 70–99)
Potassium: 4.1 mEq/L (ref 3.5–5.1)
SODIUM: 139 meq/L (ref 135–145)

## 2014-10-14 LAB — TSH: TSH: 13.23 u[IU]/mL — AB (ref 0.35–4.50)

## 2014-10-14 LAB — HEMOGLOBIN A1C: Hgb A1c MFr Bld: 5.5 % (ref 4.6–6.5)

## 2014-10-14 MED ORDER — AMLODIPINE BESYLATE 5 MG PO TABS: 5.0000 mg | ORAL_TABLET | Freq: Every day | ORAL | Status: DC

## 2014-10-14 NOTE — Patient Instructions (Addendum)
You had the Pneumovax shot today  Please take all new medication as prescribed - the amlodipine 5 mg per day  Please call [employer] to see if the shingles shot is covered  Please continue all other medications as before, and refills have been done if requested.  Please have the pharmacy call with any other refills you may need.  Please continue your efforts at being more active, low cholesterol diet, and weight control.  You are otherwise up to date with prevention measures today.  Please keep your appointments with your specialists as you may have planned  Please go to the LAB in the Basement (turn left off the elevator) for the tests to be done today  You will be contacted by phone if any changes need to be made immediately.  Otherwise, you will receive a letter about your results with an explanation, but please check with MyChart first.  Please remember to sign up for MyChart if you have not done so, as this will be important to you in the future with finding out test results, communicating by private email, and scheduling acute appointments online when needed.  Please return in 1 year for your yearly visit, or sooner if needed, with Lab testing done 3-5 days before  Good Luck with your surgury

## 2014-10-14 NOTE — Progress Notes (Signed)
Pre visit review using our clinic review tool, if applicable. No additional management support is needed unless otherwise documented below in the visit note. 

## 2014-10-14 NOTE — Assessment & Plan Note (Signed)

## 2014-10-14 NOTE — Assessment & Plan Note (Signed)
Pt is cleared for surgury from medical standpoint pending labs today

## 2014-10-14 NOTE — Assessment & Plan Note (Signed)
New onset, mild, for amlod 5 qd,  to f/u any worsening symptoms or concerns

## 2014-10-14 NOTE — Assessment & Plan Note (Signed)
Asympt, for a1c,  to f/u any worsening symptoms or concerns, cont diet, wt control

## 2014-10-14 NOTE — Progress Notes (Signed)
Subjective:    Patient ID: Lauren [name], female    DOB: 05/14/1946, 69 y.o.   MRN: [id number]  HPI Here for wellness and f/u;  Overall doing ok;  Pt denies Chest pain, worsening SOB, DOE, wheezing, orthopnea, PND, worsening LE edema, palpitations, dizziness or syncope.  Pt denies neurological change such as new headache, facial or extremity weakness.  Pt denies polydipsia, polyuria, or low sugar symptoms. Pt states overall good compliance with treatment and medications, good tolerability, and has been trying to follow appropriate diet.  Pt denies worsening depressive symptoms, suicidal ideation or panic. No fever, night sweats, wt loss, loss of appetite, or other constitutional symptoms.  Pt states good ability with ADL's, has low fall risk, home safety reviewed and adequate, no other significant changes in hearing or vision, and only occasionally active with exercise.  For c-spine surgury November 05, 2014 per Dr Hassel Neth  No other complaints Past Medical History  Diagnosis Date  . Depression   . Diverticulitis   . Diverticulosis   . Anxiety   . Psoriasis   . Frequent headaches   . Cervical disc disease   . Carotid stenosis 10/08/2013  . Impaired glucose tolerance 10/08/2013   Past Surgical History  Procedure Laterality Date  . Cholecystectomy    . Cervical fusion      approx 1980  . Total abdominal hysterectomy  1998    Fibroid    reports that she has never smoked. She has never used smokeless tobacco. She reports that she drinks alcohol. She reports that she does not use illicit drugs. family history includes Alcohol abuse in her other; Alcoholism in her father; Arthritis in her other; Cancer in her other; Cervical cancer in her mother; Diabetes in her brother and mother; Heart attack in her mother; Heart disease in her mother and other; Hypertension in her other; Lung cancer in her father; Throat cancer in her brother. Allergies  Allergen Reactions  . Celebrex [Celecoxib] Itching and  Rash   Current Outpatient Prescriptions on File Prior to Visit  Medication Sig Dispense Refill  . aspirin EC 325 MG tablet Take 325 mg by mouth daily.    . calcium carbonate (OS-CAL) 1250 (500 CA) MG chewable tablet Chew 2 tablets by mouth daily.    Marland Kitchen escitalopram (LEXAPRO) 10 MG tablet TAKE 1 TABLET EVERY DAY 90 tablet 3  . ibuprofen (ADVIL,MOTRIN) 600 MG tablet Take 600 mg by mouth 2 (two) times daily with a meal.  0  . polyethylene glycol (MIRALAX / GLYCOLAX) packet Take 17 g by mouth daily.    . Vitamin D, Ergocalciferol, (DRISDOL) 50000 UNITS CAPS capsule Take 1 capsule (50,000 Units total) by mouth every 7 (seven) days. (Patient taking differently: Take 50,000 Units by mouth every 7 (seven) days. wednesday) 12 capsule 0  . zolpidem (AMBIEN) 5 MG tablet TAKE 1 TABLET BY MOUTH AT BEDTIME AS NEEDED FOR SLEEP 30 tablet 5  . oxyCODONE-acetaminophen (PERCOCET/ROXICET) 5-325 MG per tablet Take 1 tablet by mouth every 4 (four) hours as needed for moderate pain. (Patient not taking: Reported on 10/14/2014) 60 tablet 0   No current facility-administered medications on file prior to visit.     Review of Systems Constitutional: Negative for increased diaphoresis, other activity, appetite or siginficant weight change other than noted HENT: Negative for worsening hearing loss, ear pain, facial swelling, mouth sores and neck stiffness.   Eyes: Negative for other worsening pain, redness or visual disturbance.  Respiratory: Negative for shortness of breath  and wheezing  Cardiovascular: Negative for chest pain and palpitations.  Gastrointestinal: Negative for diarrhea, blood in stool, abdominal distention or other pain Genitourinary: Negative for hematuria, flank pain or change in urine volume.  Musculoskeletal: Negative for myalgias or other joint complaints.  Skin: Negative for color change and wound or drainage.  Neurological: Negative for syncope and numbness. other than noted Hematological:  Negative for adenopathy. or other swelling Psychiatric/Behavioral: Negative for hallucinations, SI, self-injury, decreased concentration or other worsening agitation.      Objective:   Physical Exam BP 144/82 mmHg  Pulse 77  Temp(Src) 98.6 F (37 C) (Oral)  Wt 142 lb 0.6 oz (64.429 kg)  SpO2 96%  LMP 05/22/1996 Wt Readings from Last 3 Encounters:  10/14/14 142 lb 0.6 oz (64.429 kg)  08/30/14 147 lb (66.679 kg)  07/30/14 148 lb (67.132 kg)  VS noted,  Constitutional: Pt is oriented to person, place, and time. Appears well-developed and well-nourished, in no significant distress Head: Normocephalic and atraumatic.  Right Ear: External ear normal.  Left Ear: External ear normal.  Nose: Nose normal.  Mouth/Throat: Oropharynx is clear and moist.  Eyes: Conjunctivae and EOM are normal. Pupils are equal, round, and reactive to light.  Neck: Normal range of motion. Neck supple. No JVD present. No tracheal deviation present or significant neck LA or mass Cardiovascular: Normal rate, regular rhythm, normal heart sounds and intact distal pulses.   Pulmonary/Chest: Effort normal and breath sounds without rales or wheezing  Abdominal: Soft. Bowel sounds are normal. NT. No HSM  Musculoskeletal: Normal range of motion. Exhibits no edema.  Lymphadenopathy:  Has no cervical adenopathy.  Neurological: Pt is alert and oriented to person, place, and time. Pt has normal reflexes. No cranial nerve deficit. Motor grossly intact Skin: Skin is warm and dry. No rash noted.  Psychiatric:  Has normal mood and affect. Behavior is normal.     Assessment & Plan:

## 2014-10-14 NOTE — Addendum Note (Signed)
Addended by: Lyman Bishop on: 10/14/2014 09:06 AM   Modules accepted: Orders

## 2014-10-15 ENCOUNTER — Ambulatory Visit (INDEPENDENT_AMBULATORY_CARE_PROVIDER_SITE_OTHER): Payer: Medicare Other | Admitting: *Deleted

## 2014-10-15 DIAGNOSIS — Z23 Encounter for immunization: Secondary | ICD-10-CM

## 2014-10-22 ENCOUNTER — Telehealth: Payer: Self-pay | Admitting: Internal Medicine

## 2014-10-22 NOTE — Telephone Encounter (Signed)
Pt advise, copy mailed as requested

## 2014-10-22 NOTE — Telephone Encounter (Signed)
Pt called in and would like to know his lab results    Best number [phone]

## 2014-10-26 NOTE — Pre-Procedure Instructions (Addendum)
    Lauren [name]  10/26/2014      Your procedure is scheduled on  Thursday, June 16.   Report to [facility] Admitting at 9:30A.M.    Call this number if you have problems the morning of surgery:  [phone]               For any other questions, please call [phone], Monday - Friday 8 AM - 4 PM.   Remember:  Do not eat food or drink liquids after midnight Wednesday, June 15.   Take these medicines the morning of surgery with A SIP OF WATER :amLODipine (NORVASC), escitalopram (LEXAPRO).               Take if needed: oxyCODONE-acetaminophen (PERCOCET/ROXICET).               On Thursday, June 9 Stop taking Aspirin and Ibuprofen (Advil).   Do not wear jewelry, make-up or nail polish.  Do not wear lotions, powders, or perfumes.    Do not shave 48 hours prior to surgery .    Do not bring valuables to the hospital.  [facility] is not responsible for any belongings or valuables.  Contacts, dentures or bridgework may not be worn into surgery.  Leave your suitcase in the car.  After surgery it may be brought to your room.  For patients admitted to the hospital, discharge time will be determined by your treatment team.  Patients discharged the day of surgery will not be allowed to drive home.   -  Special instructions:  Review  [facility] - Preparing For Surgery.  Please read over the following fact sheets that you were given. Pain Booklet, Coughing and Deep Breathing and Surgical Site Infection Prevention

## 2014-10-27 ENCOUNTER — Encounter (HOSPITAL_COMMUNITY)
Admission: RE | Admit: 2014-10-27 | Discharge: 2014-10-27 | Disposition: A | Discharge status: Home / Self Care | Payer: Medicare Other | Source: Ambulatory Visit | Attending: Neurological Surgery | Admitting: Neurological Surgery

## 2014-10-27 ENCOUNTER — Encounter (HOSPITAL_COMMUNITY): Payer: Self-pay

## 2014-10-27 ENCOUNTER — Encounter (HOSPITAL_COMMUNITY)
Admission: RE | Admit: 2014-10-27 | Discharge: 2014-10-27 | Disposition: A | Discharge status: Home / Self Care | Payer: Medicare Other | Source: Ambulatory Visit | Attending: Neurosurgery | Admitting: Neurosurgery

## 2014-10-27 DIAGNOSIS — Z01812 Encounter for preprocedural laboratory examination: Secondary | ICD-10-CM | POA: Insufficient documentation

## 2014-10-27 DIAGNOSIS — S129XXA Fracture of neck, unspecified, initial encounter: Secondary | ICD-10-CM | POA: Insufficient documentation

## 2014-10-27 DIAGNOSIS — X58XXXA Exposure to other specified factors, initial encounter: Secondary | ICD-10-CM | POA: Diagnosis not present

## 2014-10-27 DIAGNOSIS — Z01818 Encounter for other preprocedural examination: Secondary | ICD-10-CM | POA: Diagnosis not present

## 2014-10-27 DIAGNOSIS — I10 Essential (primary) hypertension: Secondary | ICD-10-CM | POA: Insufficient documentation

## 2014-10-27 HISTORY — DX: Unspecified osteoarthritis, unspecified site: M19.90

## 2014-10-27 HISTORY — DX: Essential (primary) hypertension: I10

## 2014-10-27 LAB — CBC WITH DIFFERENTIAL/PLATELET
BASOS PCT: 1 % (ref 0–1)
Basophils Absolute: 0 10*3/uL (ref 0.0–0.1)
Eosinophils Absolute: 0.2 10*3/uL (ref 0.0–0.7)
Eosinophils Relative: 3 % (ref 0–5)
HCT: 39.3 % (ref 36.0–46.0)
Hemoglobin: 12.7 g/dL (ref 12.0–15.0)
LYMPHS PCT: 29 % (ref 12–46)
Lymphs Abs: 1.7 10*3/uL (ref 0.7–4.0)
MCH: 29.9 pg (ref 26.0–34.0)
MCHC: 32.3 g/dL (ref 30.0–36.0)
MCV: 92.5 fL (ref 78.0–100.0)
Monocytes Absolute: 0.4 10*3/uL (ref 0.1–1.0)
Monocytes Relative: 6 % (ref 3–12)
Neutro Abs: 3.6 10*3/uL (ref 1.7–7.7)
Neutrophils Relative %: 61 % (ref 43–77)
PLATELETS: 210 10*3/uL (ref 150–400)
RBC: 4.25 MIL/uL (ref 3.87–5.11)
RDW: 14 % (ref 11.5–15.5)
WBC: 5.9 10*3/uL (ref 4.0–10.5)

## 2014-10-27 LAB — BASIC METABOLIC PANEL
ANION GAP: 11 (ref 5–15)
BUN: 12 mg/dL (ref 6–20)
CO2: 28 mmol/L (ref 22–32)
CREATININE: 0.71 mg/dL (ref 0.44–1.00)
Calcium: 9.4 mg/dL (ref 8.9–10.3)
Chloride: 98 mmol/L — ABNORMAL LOW (ref 101–111)
GFR calc Af Amer: >60 mL/min (ref >60)
GFR calc non Af Amer: >60 mL/min (ref >60)
Glucose, Bld: 97 mg/dL (ref 65–99)
Potassium: 3.7 mmol/L (ref 3.5–5.1)
Sodium: 137 mmol/L (ref 135–145)

## 2014-10-27 LAB — PROTIME-INR
INR: 0.97 (ref 0.00–1.49)
PROTHROMBIN TIME: 13.1 s (ref 11.6–15.2)

## 2014-10-27 LAB — SURGICAL PCR SCREEN
MRSA, PCR: NEGATIVE
Staphylococcus aureus: NEGATIVE

## 2014-11-02 ENCOUNTER — Other Ambulatory Visit (INDEPENDENT_AMBULATORY_CARE_PROVIDER_SITE_OTHER): Payer: Medicare Other

## 2014-11-02 DIAGNOSIS — E559 Vitamin D deficiency, unspecified: Secondary | ICD-10-CM

## 2014-11-03 ENCOUNTER — Other Ambulatory Visit: Payer: Self-pay | Admitting: Nurse Practitioner

## 2014-11-03 LAB — VITAMIN D 25 HYDROXY (VIT D DEFICIENCY, FRACTURES): VIT D 25 HYDROXY: 56 ng/mL (ref 30–100)

## 2014-11-03 NOTE — Telephone Encounter (Signed)
Pt advised to take OTC 1000 IU daily after 08/13/14 AEX. RX denied.

## 2014-11-04 MED ORDER — CEFAZOLIN SODIUM-DEXTROSE 2-3 GM-% IV SOLR
2.0000 g | INTRAVENOUS | Status: AC
  Administered 2014-11-05: 2 g via INTRAVENOUS
  Filled 2014-11-04: qty 50

## 2014-11-04 MED ORDER — DEXAMETHASONE SODIUM PHOSPHATE 10 MG/ML IJ SOLN
10.0000 mg | INTRAMUSCULAR | Status: AC
  Administered 2014-11-05: 10 mg via INTRAVENOUS
  Filled 2014-11-04: qty 1

## 2014-11-05 ENCOUNTER — Encounter (HOSPITAL_COMMUNITY): Payer: Self-pay | Admitting: *Deleted

## 2014-11-05 ENCOUNTER — Inpatient Hospital Stay (HOSPITAL_COMMUNITY)
Admission: RE | Admit: 2014-11-05 | Discharge: 2014-11-08 | DRG: 473 | Disposition: A | Discharge status: Home / Self Care | Payer: Medicare Other | Source: Ambulatory Visit | Attending: Neurological Surgery | Admitting: Neurological Surgery

## 2014-11-05 ENCOUNTER — Inpatient Hospital Stay (HOSPITAL_COMMUNITY): Payer: Medicare Other | Admitting: Anesthesiology

## 2014-11-05 ENCOUNTER — Inpatient Hospital Stay (HOSPITAL_COMMUNITY): Payer: Medicare Other

## 2014-11-05 ENCOUNTER — Encounter (HOSPITAL_COMMUNITY)
Admission: RE | Disposition: A | Discharge status: Home / Self Care | Payer: Self-pay | Source: Ambulatory Visit | Attending: Neurological Surgery

## 2014-11-05 DIAGNOSIS — F418 Other specified anxiety disorders: Secondary | ICD-10-CM | POA: Diagnosis present

## 2014-11-05 DIAGNOSIS — I10 Essential (primary) hypertension: Secondary | ICD-10-CM | POA: Diagnosis present

## 2014-11-05 DIAGNOSIS — M1990 Unspecified osteoarthritis, unspecified site: Secondary | ICD-10-CM | POA: Diagnosis present

## 2014-11-05 DIAGNOSIS — Z981 Arthrodesis status: Secondary | ICD-10-CM

## 2014-11-05 DIAGNOSIS — S12400A Unspecified displaced fracture of fifth cervical vertebra, initial encounter for closed fracture: Secondary | ICD-10-CM | POA: Diagnosis present

## 2014-11-05 DIAGNOSIS — Z419 Encounter for procedure for purposes other than remedying health state, unspecified: Secondary | ICD-10-CM

## 2014-11-05 DIAGNOSIS — S12500A Unspecified displaced fracture of sixth cervical vertebra, initial encounter for closed fracture: Secondary | ICD-10-CM | POA: Diagnosis present

## 2014-11-05 DIAGNOSIS — Z888 Allergy status to other drugs, medicaments and biological substances status: Secondary | ICD-10-CM | POA: Diagnosis not present

## 2014-11-05 DIAGNOSIS — Z7982 Long term (current) use of aspirin: Secondary | ICD-10-CM

## 2014-11-05 DIAGNOSIS — M542 Cervicalgia: Secondary | ICD-10-CM | POA: Diagnosis present

## 2014-11-05 HISTORY — PX: POSTERIOR CERVICAL FUSION/FORAMINOTOMY: SHX5038

## 2014-11-05 SURGERY — POSTERIOR CERVICAL FUSION/FORAMINOTOMY LEVEL 3
Anesthesia: General | Site: Back

## 2014-11-05 MED ORDER — ESCITALOPRAM OXALATE 10 MG PO TABS
10.0000 mg | ORAL_TABLET | Freq: Every day | ORAL | Status: DC
  Administered 2014-11-06 – 2014-11-08 (×3): 10 mg via ORAL
  Filled 2014-11-05 (×3): qty 1

## 2014-11-05 MED ORDER — SODIUM CHLORIDE 0.9 % IJ SOLN: 3.0000 mL | INTRAMUSCULAR | Status: DC | PRN

## 2014-11-05 MED ORDER — MEPERIDINE HCL 25 MG/ML IJ SOLN: 6.2500 mg | INTRAMUSCULAR | Status: DC | PRN

## 2014-11-05 MED ORDER — ONDANSETRON HCL 4 MG/2ML IJ SOLN
INTRAMUSCULAR | Status: DC | PRN
  Administered 2014-11-05: 4 mg via INTRAVENOUS

## 2014-11-05 MED ORDER — SODIUM CHLORIDE 0.9 % IJ SOLN
3.0000 mL | Freq: Two times a day (BID) | INTRAMUSCULAR | Status: DC
  Administered 2014-11-06 – 2014-11-08 (×3): 3 mL via INTRAVENOUS

## 2014-11-05 MED ORDER — HYDROMORPHONE HCL 1 MG/ML IJ SOLN
INTRAMUSCULAR | Status: AC
  Filled 2014-11-05: qty 1

## 2014-11-05 MED ORDER — ROCURONIUM BROMIDE 50 MG/5ML IV SOLN
INTRAVENOUS | Status: AC
  Filled 2014-11-05: qty 1

## 2014-11-05 MED ORDER — SUCCINYLCHOLINE CHLORIDE 20 MG/ML IJ SOLN
INTRAMUSCULAR | Status: DC | PRN
  Administered 2014-11-05: 100 mg via INTRAVENOUS

## 2014-11-05 MED ORDER — BACITRACIN 50000 UNITS IM SOLR
INTRAMUSCULAR | Status: DC | PRN
  Administered 2014-11-05: 500 mL

## 2014-11-05 MED ORDER — METHOCARBAMOL 1000 MG/10ML IJ SOLN
500.0000 mg | Freq: Four times a day (QID) | INTRAVENOUS | Status: DC | PRN
  Administered 2014-11-05: 500 mg via INTRAVENOUS
  Filled 2014-11-05 (×3): qty 5

## 2014-11-05 MED ORDER — 0.9 % SODIUM CHLORIDE (POUR BTL) OPTIME
TOPICAL | Status: DC | PRN
  Administered 2014-11-05: 1000 mL

## 2014-11-05 MED ORDER — NEOSTIGMINE METHYLSULFATE 10 MG/10ML IV SOLN
INTRAVENOUS | Status: AC
  Filled 2014-11-05: qty 1

## 2014-11-05 MED ORDER — ACETAMINOPHEN 10 MG/ML IV SOLN
INTRAVENOUS | Status: DC | PRN
  Administered 2014-11-05: 1000 mg via INTRAVENOUS

## 2014-11-05 MED ORDER — ONDANSETRON HCL 4 MG/2ML IJ SOLN: 4.0000 mg | INTRAMUSCULAR | Status: DC | PRN

## 2014-11-05 MED ORDER — PROPOFOL 10 MG/ML IV BOLUS
INTRAVENOUS | Status: DC | PRN
  Administered 2014-11-05: 150 mg via INTRAVENOUS

## 2014-11-05 MED ORDER — AMLODIPINE BESYLATE 5 MG PO TABS
5.0000 mg | ORAL_TABLET | Freq: Every day | ORAL | Status: DC
Start: 2014-11-05 — End: 2014-11-05

## 2014-11-05 MED ORDER — LIDOCAINE HCL (CARDIAC) 20 MG/ML IV SOLN
INTRAVENOUS | Status: AC
  Filled 2014-11-05: qty 5

## 2014-11-05 MED ORDER — THROMBIN 5000 UNITS EX SOLR
OROMUCOSAL | Status: DC | PRN
  Administered 2014-11-05: 10 mL via TOPICAL

## 2014-11-05 MED ORDER — NEOSTIGMINE METHYLSULFATE 10 MG/10ML IV SOLN
INTRAVENOUS | Status: DC | PRN
  Administered 2014-11-05: 5 mg via INTRAVENOUS

## 2014-11-05 MED ORDER — GLYCOPYRROLATE 0.2 MG/ML IJ SOLN
INTRAMUSCULAR | Status: AC
  Filled 2014-11-05: qty 3

## 2014-11-05 MED ORDER — SODIUM CHLORIDE 0.9 % IV SOLN: 250.0000 mL | INTRAVENOUS | Status: DC

## 2014-11-05 MED ORDER — PROPOFOL 10 MG/ML IV BOLUS
INTRAVENOUS | Status: AC
  Filled 2014-11-05: qty 20

## 2014-11-05 MED ORDER — FENTANYL CITRATE (PF) 100 MCG/2ML IJ SOLN
INTRAMUSCULAR | Status: DC | PRN
  Administered 2014-11-05: 50 ug via INTRAVENOUS
  Administered 2014-11-05: 100 ug via INTRAVENOUS

## 2014-11-05 MED ORDER — FENTANYL CITRATE (PF) 250 MCG/5ML IJ SOLN
INTRAMUSCULAR | Status: AC
  Filled 2014-11-05: qty 5

## 2014-11-05 MED ORDER — MIDAZOLAM HCL 5 MG/5ML IJ SOLN
INTRAMUSCULAR | Status: DC | PRN
  Administered 2014-11-05: 2 mg via INTRAVENOUS

## 2014-11-05 MED ORDER — MORPHINE SULFATE 4 MG/ML IJ SOLN
INTRAMUSCULAR | Status: AC
  Filled 2014-11-05: qty 1

## 2014-11-05 MED ORDER — ACETAMINOPHEN 325 MG PO TABS: 650.0000 mg | ORAL_TABLET | ORAL | Status: DC | PRN

## 2014-11-05 MED ORDER — THROMBIN 20000 UNITS EX SOLR
CUTANEOUS | Status: DC | PRN
  Administered 2014-11-05: 20 mL via TOPICAL

## 2014-11-05 MED ORDER — VECURONIUM BROMIDE 10 MG IV SOLR
INTRAVENOUS | Status: AC
  Filled 2014-11-05: qty 10

## 2014-11-05 MED ORDER — OXYCODONE-ACETAMINOPHEN 5-325 MG PO TABS
1.0000 | ORAL_TABLET | ORAL | Status: DC | PRN
  Administered 2014-11-05 – 2014-11-08 (×9): 2 via ORAL
  Filled 2014-11-05 (×9): qty 2

## 2014-11-05 MED ORDER — PHENOL 1.4 % MT LIQD: 1.0000 | OROMUCOSAL | Status: DC | PRN

## 2014-11-05 MED ORDER — LIDOCAINE HCL (CARDIAC) 20 MG/ML IV SOLN
INTRAVENOUS | Status: DC | PRN
  Administered 2014-11-05: 100 mg via INTRAVENOUS

## 2014-11-05 MED ORDER — METHOCARBAMOL 500 MG PO TABS
500.0000 mg | ORAL_TABLET | Freq: Four times a day (QID) | ORAL | Status: DC | PRN
  Administered 2014-11-07 – 2014-11-08 (×3): 500 mg via ORAL
  Filled 2014-11-05 (×4): qty 1

## 2014-11-05 MED ORDER — LACTATED RINGERS IV SOLN
INTRAVENOUS | Status: DC | PRN
  Administered 2014-11-05 (×2): via INTRAVENOUS

## 2014-11-05 MED ORDER — VECURONIUM BROMIDE 10 MG IV SOLR
INTRAVENOUS | Status: DC | PRN
  Administered 2014-11-05: 5 mg via INTRAVENOUS

## 2014-11-05 MED ORDER — ONDANSETRON HCL 4 MG/2ML IJ SOLN
INTRAMUSCULAR | Status: AC
  Filled 2014-11-05: qty 2

## 2014-11-05 MED ORDER — HYDROMORPHONE HCL 1 MG/ML IJ SOLN
0.2500 mg | INTRAMUSCULAR | Status: DC | PRN
  Administered 2014-11-05 (×4): 0.5 mg via INTRAVENOUS

## 2014-11-05 MED ORDER — MORPHINE SULFATE 2 MG/ML IJ SOLN
1.0000 mg | INTRAMUSCULAR | Status: DC | PRN
  Administered 2014-11-05 – 2014-11-08 (×11): 2 mg via INTRAVENOUS
  Filled 2014-11-05 (×8): qty 1

## 2014-11-05 MED ORDER — ACETAMINOPHEN 650 MG RE SUPP: 650.0000 mg | RECTAL | Status: DC | PRN

## 2014-11-05 MED ORDER — POTASSIUM CHLORIDE IN NACL 20-0.9 MEQ/L-% IV SOLN: INTRAVENOUS | Status: DC

## 2014-11-05 MED ORDER — STERILE WATER FOR INJECTION IJ SOLN
INTRAMUSCULAR | Status: AC
  Filled 2014-11-05: qty 10

## 2014-11-05 MED ORDER — CEFAZOLIN SODIUM 1-5 GM-% IV SOLN
1.0000 g | Freq: Three times a day (TID) | INTRAVENOUS | Status: AC
  Administered 2014-11-05 – 2014-11-06 (×2): 1 g via INTRAVENOUS
  Filled 2014-11-05 (×3): qty 50

## 2014-11-05 MED ORDER — BUPIVACAINE HCL (PF) 0.25 % IJ SOLN
INTRAMUSCULAR | Status: DC | PRN
  Administered 2014-11-05: 10 mL

## 2014-11-05 MED ORDER — PROMETHAZINE HCL 25 MG/ML IJ SOLN
6.2500 mg | INTRAMUSCULAR | Status: DC | PRN
Start: 2014-11-05 — End: 2014-11-05

## 2014-11-05 MED ORDER — DEXAMETHASONE SODIUM PHOSPHATE 4 MG/ML IJ SOLN
INTRAMUSCULAR | Status: AC
  Filled 2014-11-05: qty 2

## 2014-11-05 MED ORDER — MENTHOL 3 MG MT LOZG: 1.0000 | LOZENGE | OROMUCOSAL | Status: DC | PRN

## 2014-11-05 MED ORDER — GLYCOPYRROLATE 0.2 MG/ML IJ SOLN
INTRAMUSCULAR | Status: DC | PRN
  Administered 2014-11-05: 0.6 mg via INTRAVENOUS

## 2014-11-05 MED ORDER — PHENYLEPHRINE HCL 10 MG/ML IJ SOLN
10.0000 mg | INTRAMUSCULAR | Status: DC | PRN
  Administered 2014-11-05: 20 ug/min via INTRAVENOUS

## 2014-11-05 MED ORDER — LACTATED RINGERS IV SOLN
INTRAVENOUS | Status: DC
  Administered 2014-11-05: 09:00:00 via INTRAVENOUS

## 2014-11-05 MED ORDER — MIDAZOLAM HCL 2 MG/2ML IJ SOLN
INTRAMUSCULAR | Status: AC
  Filled 2014-11-05: qty 2

## 2014-11-05 MED ORDER — AMLODIPINE BESYLATE 5 MG PO TABS
5.0000 mg | ORAL_TABLET | Freq: Every day | ORAL | Status: DC
  Administered 2014-11-06 – 2014-11-08 (×3): 5 mg via ORAL
  Filled 2014-11-05 (×3): qty 1

## 2014-11-05 SURGICAL SUPPLY — 63 items
APL SKNCLS STERI-STRIP NONHPOA (GAUZE/BANDAGES/DRESSINGS) ×1
BAG DECANTER FOR FLEXI CONT (MISCELLANEOUS) ×2 IMPLANT
BENZOIN TINCTURE PRP APPL 2/3 (GAUZE/BANDAGES/DRESSINGS) ×2 IMPLANT
BIT DRILL VUEPOINT II (BIT) IMPLANT
BLADE CLIPPER SURG (BLADE) IMPLANT
BONE MATRIX OSTEOCEL PRO MED (Bone Implant) ×1 IMPLANT
BUR MATCHSTICK NEURO 3.0 LAGG (BURR) IMPLANT
CANISTER SUCT 3000ML PPV (MISCELLANEOUS) ×2 IMPLANT
CONT SPEC 4OZ CLIKSEAL STRL BL (MISCELLANEOUS) ×2 IMPLANT
DRAPE C-ARM 42X72 X-RAY (DRAPES) ×4 IMPLANT
DRAPE LAPAROTOMY 100X72 PEDS (DRAPES) ×2 IMPLANT
DRAPE MICROSCOPE LEICA (MISCELLANEOUS) IMPLANT
DRAPE POUCH INSTRU U-SHP 10X18 (DRAPES) ×2 IMPLANT
DRILL BIT VUEPOINT II (BIT) ×2
DRSG OPSITE 4X5.5 SM (GAUZE/BANDAGES/DRESSINGS) ×2 IMPLANT
DRSG OPSITE POSTOP 4X6 (GAUZE/BANDAGES/DRESSINGS) ×1 IMPLANT
DRSG OPSITE POSTOP 4X8 (GAUZE/BANDAGES/DRESSINGS) ×1 IMPLANT
DRSG TELFA 3X8 NADH (GAUZE/BANDAGES/DRESSINGS) ×2 IMPLANT
DURAPREP 26ML APPLICATOR (WOUND CARE) ×2 IMPLANT
ELECT REM PT RETURN 9FT ADLT (ELECTROSURGICAL) ×2
ELECTRODE REM PT RTRN 9FT ADLT (ELECTROSURGICAL) ×1 IMPLANT
EVACUATOR 1/8 PVC DRAIN (DRAIN) IMPLANT
GAUZE SPONGE 4X4 12PLY STRL (GAUZE/BANDAGES/DRESSINGS) ×2 IMPLANT
GAUZE SPONGE 4X4 16PLY XRAY LF (GAUZE/BANDAGES/DRESSINGS) IMPLANT
GLOVE BIO SURGEON STRL SZ8 (GLOVE) ×2 IMPLANT
GLOVE BIOGEL PI IND STRL 7.5 (GLOVE) IMPLANT
GLOVE BIOGEL PI INDICATOR 7.5 (GLOVE) ×1
GLOVE ECLIPSE 7.0 STRL STRAW (GLOVE) ×1 IMPLANT
GOWN STRL REUS W/ TWL LRG LVL3 (GOWN DISPOSABLE) IMPLANT
GOWN STRL REUS W/ TWL XL LVL3 (GOWN DISPOSABLE) ×1 IMPLANT
GOWN STRL REUS W/TWL 2XL LVL3 (GOWN DISPOSABLE) IMPLANT
GOWN STRL REUS W/TWL LRG LVL3 (GOWN DISPOSABLE) ×4
GOWN STRL REUS W/TWL XL LVL3 (GOWN DISPOSABLE) ×6
HEMOSTAT POWDER KIT SURGIFOAM (HEMOSTASIS) IMPLANT
HEMOSTAT POWDER SURGIFOAM 1G (HEMOSTASIS) ×1 IMPLANT
KIT BASIN OR (CUSTOM PROCEDURE TRAY) ×2 IMPLANT
KIT ROOM TURNOVER OR (KITS) ×2 IMPLANT
MARKER SKIN DUAL TIP RULER LAB (MISCELLANEOUS) ×1 IMPLANT
NDL SPNL 20GX3.5 QUINCKE YW (NEEDLE) IMPLANT
NEEDLE HYPO 22GX1.5 SAFETY (NEEDLE) ×2 IMPLANT
NEEDLE SPNL 20GX3.5 QUINCKE YW (NEEDLE) IMPLANT
NS IRRIG 1000ML POUR BTL (IV SOLUTION) ×2 IMPLANT
PACK LAMINECTOMY NEURO (CUSTOM PROCEDURE TRAY) ×2 IMPLANT
PAD ARMBOARD 7.5X6 YLW CONV (MISCELLANEOUS) ×3 IMPLANT
PAD DRESSING TELFA 3X8 NADH (GAUZE/BANDAGES/DRESSINGS) ×1 IMPLANT
PIN MAYFIELD SKULL DISP (PIN) ×2 IMPLANT
ROD 120MM (Rod) ×2 IMPLANT
ROD SPNL 120X3.5XNS LF TI (Rod) IMPLANT
RUBBERBAND STERILE (MISCELLANEOUS) IMPLANT
SCREW MA MM 3.5X12 (Screw) ×8 IMPLANT
SCREW SET THREADED (Screw) ×8 IMPLANT
SPONGE LAP 4X18 X RAY DECT (DISPOSABLE) IMPLANT
SPONGE SURGIFOAM ABS GEL 100 (HEMOSTASIS) ×2 IMPLANT
STRIP CLOSURE SKIN 1/2X4 (GAUZE/BANDAGES/DRESSINGS) ×2 IMPLANT
SUT VIC AB 0 CT1 18XCR BRD8 (SUTURE) ×1 IMPLANT
SUT VIC AB 0 CT1 8-18 (SUTURE) ×2
SUT VIC AB 2-0 CP2 18 (SUTURE) ×2 IMPLANT
SUT VIC AB 3-0 SH 8-18 (SUTURE) ×2 IMPLANT
SWABSTICK BENZOIN STERILE (MISCELLANEOUS) ×2 IMPLANT
SYR 20ML ECCENTRIC (SYRINGE) ×2 IMPLANT
TOWEL OR 17X24 6PK STRL BLUE (TOWEL DISPOSABLE) ×2 IMPLANT
TOWEL OR 17X26 10 PK STRL BLUE (TOWEL DISPOSABLE) ×2 IMPLANT
WATER STERILE IRR 1000ML POUR (IV SOLUTION) ×2 IMPLANT

## 2014-11-05 NOTE — Op Note (Signed)
11/05/2014  11:46 AM  PATIENT:  Lauren [name]  68 y.o. female  PRE-OPERATIVE DIAGNOSIS:  C5-6 cervical fracture  POST-OPERATIVE DIAGNOSIS:  Same  PROCEDURE:  1. Posterior cervical fusion C3-C7 bilaterally utilizing morcellized allograft, 2. Segmental lateral mass fixation C3-C7 inclusive utilizing Nuvasive lateral mass screws  SURGEON:  Sherley Bounds, MD  ASSISTANTS: Dr.Nundkumar  ANESTHESIA:   General  EBL: Less than 25  ml  Total I/O In: 1300 [I.V.:1300] Out: -   BLOOD ADMINISTERED:none  DRAINS: None   SPECIMEN:  No Specimen  INDICATION FOR PROCEDURE: This patient underwent a ACDF without plate many years ago. She presented after a trauma with a fracture through the fusion at C5-6. We tried to manage her for several weeks nonoperatively. Her pain continued to get progressively worse. X-rays showed no significant healing of the fracture. I recommended posterior cervical fusion with instrumentation.  Patient understood the risks, benefits, and alternatives and potential outcomes and wished to proceed.  PROCEDURE DETAILS: The patient was brought to the operating room. Generalized endotracheal anesthesia was induced. The patient was affixed a 3 point [place] headrest and rolled into the prone position on chest rolls. All pressure points were padded. The posterior cervical region was cleaned and prepped with DuraPrep and then draped in the usual sterile fashion. 7 cc of local anesthesia was injected and a dorsal midline incision made in the posterior cervical region and carried down to the cervical fascia. The fascia was opened and the paraspinous musculature was taken down to expose C3-C7 bilaterally. Intraoperative fluoroscopy confirmed my level and then the dissection was carried out over the lateral facets. I localized the midpoint of each lateral mass and marked a region 1 mm medial to the midpoint of the lateral mass, and then drilled in an upward and outward direction into the  safe zone of each lateral mass. I drilled to a depth of 12 mm and then checked my drill hole with a ball probe. I then placed a 12 mm lateral mass screws into the safe zone of each lateral mass from C3-C7 bilaterally until they were 2 fingers tight.  I then decorticated the lateral masses and the facet joints and packed them with morcellized allograft to perform arthrodesis from C3-C7 bilaterally. I then placed rods into the multiaxial screw heads of the screws and locked these into position with the locking caps and anti-torque device. I then checked the final construct with AP/Lat fluoroscopy. I irrigated with saline solution containing bacitracin.  After hemostasis was achieved I closed the muscle and the fascia with 0 Vicryl, subcutaneous tissue with 2-0 Vicryl, and the subcuticular tissue with 3-0 Vicryl. The skin was closed with benzoin and Steri-Strips. A sterile dressing was applied, the patient was turned to the supine position and taken out of the headrest, awakened from general anesthesia and transferred to the recovery room in stable condition. At the end of the procedure all sponge, needle and instrument counts were correct.   PLAN OF CARE: Admit to inpatient   PATIENT DISPOSITION:  PACU - hemodynamically stable.   Delay start of Pharmacological VTE agent (>24hrs) due to surgical blood loss or risk of bleeding:  yes

## 2014-11-05 NOTE — Anesthesia Procedure Notes (Signed)
Procedure Name: Intubation Date/Time: 11/05/2014 10:35 AM Performed by: Jenne Campus Pre-anesthesia Checklist: Patient identified, Emergency Drugs available, Suction available, Patient being monitored and Timeout performed Patient Re-evaluated:Patient Re-evaluated prior to inductionOxygen Delivery Method: Circle system utilized Preoxygenation: Pre-oxygenation with 100% oxygen Intubation Type: IV induction Ventilation: Mask ventilation without difficulty Grade View: Grade I Tube type: Oral Tube size: 7.0 mm Number of attempts: 1 Airway Equipment and Method: Stylet and Video-laryngoscopy Placement Confirmation: positive ETCO2,  CO2 detector and breath sounds checked- equal and bilateral Secured at: 21 cm Tube secured with: Tape Dental Injury: Teeth and Oropharynx as per pre-operative assessment  Difficulty Due To: Difficult Airway- due to cervical collar Comments: Elective video-glidescope for unstable neck. Smooth IV induction. EZ mask. Atraumatic intubation with video-glide. Head/neck maintained in neutral position throughout induction, mask, and intubation.

## 2014-11-05 NOTE — Progress Notes (Signed)
Orthopedic Tech Progress Note Patient Details:  Lauren [name] [birth year] [id number]  Ortho Devices Type of Ortho Device: Soft collar Ortho Device/Splint Location: neck Ortho Device/Splint Interventions: Application   [name] 11/05/2014, 12:29 PM

## 2014-11-05 NOTE — Progress Notes (Signed)
Pt received alert, verbal with no noted distress. Pt stable, neuro intact. Cervical collar on and aligned dressing dry and intact. Pt oriented to room. Safety measures in place. Call bell within reach.

## 2014-11-05 NOTE — H&P (Signed)
Subjective:   Patient is a 69 y.o. female admitted for posterior cervical fusion for C5-6 fracture. She is status post multilevel anterior cervical fusion in the remote past. The patient first presented to me with complaints of neck pain after a trauma. Onset of symptoms was 2 months ago. The pain is described as aching and occurs all day. The pain is rated moderate, and is located  in the base of the neck without radicular pain. The symptoms have been progressive. Symptoms are exacerbated by extending head backwards, and are relieved by none.  Previous work up includes CT of cervical spine, results: Fracture through the previous fused segments.  Past Medical History  Diagnosis Date  . Depression   . Diverticulitis   . Diverticulosis   . Anxiety   . Psoriasis   . Frequent headaches   . Cervical disc disease   . Carotid stenosis 10/08/2013  . Impaired glucose tolerance 10/08/2013    normal HgA1C  . Hypertension   . Arthritis     Past Surgical History  Procedure Laterality Date  . Cholecystectomy    . Cervical fusion      approx 1980  . Total abdominal hysterectomy  1998    Fibroid    Allergies  Allergen Reactions  . Celebrex [Celecoxib] Itching and Rash    History  Substance Use Topics  . Smoking status: Never Smoker   . Smokeless tobacco: Never Used  . Alcohol Use: Yes     Comment: social    Family History  Problem Relation Age of Onset  . Diabetes Brother   . Heart disease Mother   . Diabetes Mother   . Cervical cancer Mother   . Heart attack Mother   . Lung cancer Father   . Alcoholism Father   . Throat cancer Brother   . Cancer Other     lung cancer  . Heart disease Other   . Hypertension Other   . Arthritis Other   . Alcohol abuse Other    Prior to Admission medications   Medication Sig Start Date End Date Taking? Authorizing Provider  amLODipine (NORVASC) 5 MG tablet Take 1 tablet (5 mg total) by mouth daily. 10/14/14 10/14/15 Yes Biagio Borg, MD  aspirin  EC 325 MG tablet Take 325 mg by mouth daily.   Yes Historical Provider, MD  calcium carbonate (OS-CAL) 1250 (500 CA) MG chewable tablet Chew 2 tablets by mouth daily.   Yes Historical Provider, MD  escitalopram (LEXAPRO) 10 MG tablet TAKE 1 TABLET EVERY DAY 09/14/14  Yes Brook E Yisroel Ramming, MD  ibuprofen (ADVIL,MOTRIN) 600 MG tablet Take 600 mg by mouth every 6 (six) hours as needed for fever or mild pain.  08/03/14  Yes Historical Provider, MD  oxyCODONE-acetaminophen (PERCOCET/ROXICET) 5-325 MG per tablet Take 1 tablet by mouth every 4 (four) hours as needed for moderate pain. 08/31/14  Yes Eustace Moore, MD  polyethylene glycol [facility] / Floria Raveling) packet Take 17 g by mouth daily.   Yes Historical Provider, MD  Vitamin D, Ergocalciferol, (DRISDOL) 50000 UNITS CAPS capsule Take 1 capsule (50,000 Units total) by mouth every 7 (seven) days. Patient taking differently: Take 50,000 Units by mouth every 7 (seven) days. wednesday 08/13/14  Yes Kem Boroughs, FNP  zolpidem (AMBIEN) 5 MG tablet TAKE 1 TABLET BY MOUTH AT BEDTIME AS NEEDED FOR SLEEP 08/12/14   Biagio Borg, MD     Review of Systems  Positive ROS: neg  All other systems have  been reviewed and were otherwise negative with the exception of those mentioned in the HPI and as above.  Objective: Vital signs in last 24 hours: Temp:  [98.4 F (36.9 C)] 98.4 F (36.9 C) (06/16 0820) Pulse Rate:  [80] 80 (06/16 0820) Resp:  [20] 20 (06/16 0820) BP: (134)/(51) 134/51 mmHg (06/16 0820) SpO2:  [100 %] 100 % (06/16 0820) Weight:  [141 lb (63.957 kg)] 141 lb (63.957 kg) (06/16 0820)  General Appearance: Alert, cooperative, no distress, appears stated age Head: Normocephalic, without obvious abnormality, atraumatic Eyes: PERRL, conjunctiva/corneas clear, EOM's intact      Neck: Supple, symmetrical, trachea midline, Back: Symmetric, no curvature, ROM normal, no CVA tenderness Lungs:  respirations unlabored Heart: Regular rate and  rhythm Abdomen: Soft, non-tender Extremities: Extremities normal, atraumatic, no cyanosis or edema Pulses: 2+ and symmetric all extremities Skin: Skin color, texture, turgor normal, no rashes or lesions  NEUROLOGIC:  Mental status: Alert and oriented x4, no aphasia, good attention span, fund of knowledge and memory  Motor Exam - grossly normal Sensory Exam - grossly normal Reflexes: 1+ Coordination - grossly normal Gait - grossly normal Balance - grossly normal Cranial Nerves: I: smell Not tested  II: visual acuity  OS: nl    OD: nl  II: visual fields Full to confrontation  II: pupils Equal, round, reactive to light  III,VII: ptosis None  III,IV,VI: extraocular muscles  Full ROM  V: mastication Normal  V: facial light touch sensation  Normal  V,VII: corneal reflex  Present  VII: facial muscle function - upper  Normal  VII: facial muscle function - lower Normal  VIII: hearing Not tested  IX: soft palate elevation  Normal  IX,X: gag reflex Present  XI: trapezius strength  5/5  XI: sternocleidomastoid strength 5/5  XI: neck flexion strength  5/5  XII: tongue strength  Normal    Data Review Lab Results  Component Value Date   WBC 5.9 10/27/2014   HGB 12.7 10/27/2014   HCT 39.3 10/27/2014   MCV 92.5 10/27/2014   PLT 210 10/27/2014   Lab Results  Component Value Date   NA 137 10/27/2014   K 3.7 10/27/2014   CL 98* 10/27/2014   CO2 28 10/27/2014   BUN 12 10/27/2014   CREATININE 0.71 10/27/2014   GLUCOSE 97 10/27/2014   Lab Results  Component Value Date   INR 0.97 10/27/2014    Assessment:   Cervical neck pain with fracture of the cervical spine. Patient has failed conservative therapy. Planned surgery : Posterior cervical fusion C4-C7.  Plan:   I explained the condition and procedure to the patient and answered any questions.  Patient wishes to proceed with procedure as planned. Understands risks/ benefits/ and expected or typical outcomes.  Frutoso Dimare  S 11/05/2014 9:51 AM

## 2014-11-05 NOTE — Transfer of Care (Signed)
Immediate Anesthesia Transfer of Care Note  Patient: Lauren [name]  Procedure(s) Performed: Procedure(s) with comments: Cervical four to Cervical seven  Posterior Cervical Fusion with lateral mass fixation (N/A) -  C4 - C7 Posterior Cervical Fusion with lateral mass fixation  Patient Location: PACU  Anesthesia Type:General  Level of Consciousness: oriented, sedated and patient cooperative  Airway & Oxygen Therapy: Patient Spontanous Breathing and Patient connected to face mask oxygen  Post-op Assessment: Report given to RN and Post -op Vital signs reviewed and stable  Post vital signs: Reviewed  Last Vitals:  Filed Vitals:   11/05/14 0820  BP: 134/51  Pulse: 80  Temp: 36.9 C  Resp: 20    Complications: No apparent anesthesia complications

## 2014-11-05 NOTE — Anesthesia Preprocedure Evaluation (Addendum)
Anesthesia Evaluation  Patient identified by MRN, date of birth, ID band Patient awake    Reviewed: Allergy & Precautions, NPO status , Patient's Chart, lab work & pertinent test results  History of Anesthesia Complications Negative for: history of anesthetic complications  Airway Mallampati: II  TM Distance: >3 FB Neck ROM: Full    Dental no notable dental hx. (+) Teeth Intact, Dental Advisory Given   Pulmonary neg pulmonary ROS,  breath sounds clear to auscultation  Pulmonary exam normal       Cardiovascular hypertension, Pt. on medications + Peripheral Vascular Disease Normal cardiovascular examRhythm:Regular Rate:Normal     Neuro/Psych  Headaches, PSYCHIATRIC DISORDERS Anxiety Depression Patient in C-collar    GI/Hepatic negative GI ROS, Neg liver ROS,   Endo/Other  negative endocrine ROS  Renal/GU negative Renal ROS     Musculoskeletal  (+) Arthritis -,   Abdominal   Peds  Hematology negative hematology ROS (+)   Anesthesia Other Findings   Reproductive/Obstetrics negative OB ROS                          Anesthesia Physical Anesthesia Plan  ASA: III  Anesthesia Plan: General   Post-op Pain Management:    Induction: Intravenous  Airway Management Planned: Oral ETT  Additional Equipment:   Intra-op Plan:   Post-operative Plan: Extubation in OR  Informed Consent: I have reviewed the patients History and Physical, chart, labs and discussed the procedure including the risks, benefits and alternatives for the proposed anesthesia with the patient or authorized representative who has indicated his/her understanding and acceptance.   Dental advisory given  Plan Discussed with: CRNA  Anesthesia Plan Comments:         Anesthesia Quick Evaluation

## 2014-11-06 ENCOUNTER — Encounter (HOSPITAL_COMMUNITY): Payer: Self-pay | Admitting: Neurological Surgery

## 2014-11-06 MED ORDER — ZOLPIDEM TARTRATE 5 MG PO TABS
5.0000 mg | ORAL_TABLET | Freq: Every evening | ORAL | Status: DC | PRN
  Administered 2014-11-06: 5 mg via ORAL
  Filled 2014-11-06: qty 1

## 2014-11-06 NOTE — Anesthesia Postprocedure Evaluation (Signed)
Anesthesia Post Note  Patient: Lauren [name]  Procedure(s) Performed: Procedure(s) (LRB): Cervical four to Cervical seven  Posterior Cervical Fusion with lateral mass fixation (N/A)  Anesthesia type: General  Patient location: PACU  Post pain: Pain level controlled  Post assessment: Post-op Vital signs reviewed  Last Vitals: BP 112/41 mmHg  Pulse 84  Temp(Src) 36.4 C (Oral)  Resp 18  Ht 5\' 4"  (1.626 m)  Wt 141 lb (63.957 kg)  BMI 24.19 kg/m2  SpO2 91%  LMP 05/22/1996  Post vital signs: Reviewed  Level of consciousness: sedated  Complications: No apparent anesthesia complications

## 2014-11-06 NOTE — Progress Notes (Signed)
Subjective: Patient reports she is doing well. She has appropriate postoperative neck soreness. No arm pain or numbness tingling or weakness.  Objective: Vital signs in last 24 hours: Temp:  [97.2 F (36.2 C)-98.8 F (37.1 C)] 98.7 F (37.1 C) (06/17 0653) Pulse Rate:  [72-92] 86 (06/17 0653) Resp:  [14-22] 18 (06/17 0653) BP: (106-141)/(44-69) 141/60 mmHg (06/17 0653) SpO2:  [92 %-100 %] 95 % (06/17 0653) Weight:  [141 lb (63.957 kg)] 141 lb (63.957 kg) (06/16 0820)  Intake/Output from previous day: 06/16 0730 - 06/17 0729 In: 1000 [I.V.:1000] Out: -  Intake/Output this shift:    Neurologic: Grossly normal  Lab Results: Lab Results  Component Value Date   WBC 5.9 10/27/2014   HGB 12.7 10/27/2014   HCT 39.3 10/27/2014   MCV 92.5 10/27/2014   PLT 210 10/27/2014   Lab Results  Component Value Date   INR 0.97 10/27/2014   BMET Lab Results  Component Value Date   NA 137 10/27/2014   K 3.7 10/27/2014   CL 98* 10/27/2014   CO2 28 10/27/2014   GLUCOSE 97 10/27/2014   BUN 12 10/27/2014   CREATININE 0.71 10/27/2014   CALCIUM 9.4 10/27/2014    Studies/Results: Dg Cervical Spine 2-3 Views  11/05/2014   CLINICAL DATA:  C4-C7 posterior cervical fusion. Cervical spine fracture.  EXAM: CERVICAL SPINE  4+ VIEWS  COMPARISON:  08/30/2014.  FINDINGS: C4 through C7 posterior rod and screw fixation for atypical fracture of C4 through C7 fusion segment. Endotracheal tube is visible on the frontal projection.  IMPRESSION: C4 through C7 posterior rod and screw fixation.   Electronically Signed   By: Dereck Ligas M.D.   On: 11/05/2014 12:21   Dg C-arm 1-60 Min  11/05/2014   CLINICAL DATA:  C4-C7 posterior cervical fusion. Cervical spine fracture.  EXAM: CERVICAL SPINE  4+ VIEWS  COMPARISON:  08/30/2014.  FINDINGS: C4 through C7 posterior rod and screw fixation for atypical fracture of C4 through C7 fusion segment. Endotracheal tube is visible on the frontal projection.  IMPRESSION:  C4 through C7 posterior rod and screw fixation.   Electronically Signed   By: Dereck Ligas M.D.   On: 11/05/2014 12:21    Assessment/Plan: She would like to stay 1 more day for pain control. That is not unreasonable.   LOS: 1 day    Sparkles Mcneely S 11/06/2014, 7:42 AM

## 2014-11-06 NOTE — Evaluation (Signed)
Occupational Therapy Evaluation Patient Details Name: Lauren [name] MRN: [id number] DOB: 11-Sep-1945 Today's Date: 11/06/2014    History of Present Illness 69 yo female s/p c3-7 posterior cervical fusion PMH: depression, diverticulitis, anxiety, psoriasis, HTN arthritis, cervical fusion 1980    Clinical Impression   Patient is s/p C3-7 posterior cervical fusion surgery resulting in functional limitations due to the deficits listed below (see OT problem list). Education and handout provided on cervical precautions. Pt able to maintain precautions while completing grooming task standing at sink. Pt lost balance upon standing, but was able to self-correct. Pt reports spouse and son available for A with ADLs upon d/c.  Patient will benefit from skilled OT acutely to increase independence and safety with ADLS to allow discharge home.     Follow Up Recommendations  No OT follow up;Supervision - Intermittent    Equipment Recommendations  None recommended by OT    Recommendations for Other Services       Precautions / Restrictions Precautions Precautions: Cervical Required Braces or Orthoses: Cervical Brace Cervical Brace: Soft collar;At all times Restrictions Weight Bearing Restrictions: No      Mobility Bed Mobility Overal bed mobility: Modified Independent             General bed mobility comments: Pt use bedrail to A supine > sit EOB  Transfers Overall transfer level: Needs assistance Equipment used: None Transfers: Sit to/from Stand Sit to Stand: Min guard         General transfer comment: loss of balance upon standing; Pt able to self-correct    Balance Overall balance assessment: Needs assistance Sitting-balance support: Feet supported       Standing balance support: Single extremity supported;During functional activity                                ADL Overall ADL's : Needs assistance/impaired Eating/Feeding: Independent;Sitting    Grooming: Wash/dry hands;Wash/dry face;Oral care;Supervision/safety;Standing (cuing for cervical precautions)   Upper Body Bathing: Supervision/ safety;Sitting   Lower Body Bathing: Supervison/ safety;Sit to/from stand;Cueing for safety   Upper Body Dressing : Supervision/safety;Sitting   Lower Body Dressing: Supervision/safety;Sit to/from stand   Toilet Transfer: Supervision/safety;Ambulation;Regular Toilet   Toileting- Water quality scientist and Hygiene: Independent;Sit to/from stand       Functional mobility during ADLs: Min guard General ADL Comments: Pt reports spouse provides supervision for safety with bathing at home     Vision Vision Assessment?: No apparent visual deficits   Perception     Praxis      Pertinent Vitals/Pain Pain Assessment: No/denies pain (premedicated)     Hand Dominance Right   Extremity/Trunk Assessment Upper Extremity Assessment Upper Extremity Assessment: Overall WFL for tasks assessed   Lower Extremity Assessment Lower Extremity Assessment: Overall WFL for tasks assessed       Communication Communication Communication: No difficulties   Cognition Arousal/Alertness: Awake/alert Behavior During Therapy: WFL for tasks assessed/performed Overall Cognitive Status: Within Functional Limits for tasks assessed                     General Comments       Exercises       Shoulder Instructions      Home Living Family/patient expects to be discharged to:: Private residence Living Arrangements: Spouse/significant other Available Help at Discharge: Family;Available 24 hours/day Type of Home: House Home Access: Stairs to enter [employer] of Steps: 9 Entrance Stairs-Rails: Right;Left Home Layout:  One level     Bathroom Shower/Tub: Tub/shower unit;Curtain Shower/tub characteristics: Architectural technologist: Standard     Home Equipment: Grab bars - tub/shower;Walker - 2 wheels;Cane - quad;Crutches;Shower  seat          Prior Functioning/Environment Level of Independence: Independent             OT Diagnosis: Generalized weakness   OT Problem List: Decreased strength;Impaired balance (sitting and/or standing);Decreased knowledge of precautions;Decreased knowledge of use of DME or AE   OT Treatment/Interventions: Self-care/ADL training;Therapeutic exercise;DME and/or AE instruction;Therapeutic activities;Patient/family education;Balance training    OT Goals(Current goals can be found in the care plan section) Acute Rehab OT Goals Patient Stated Goal: to go home OT Goal Formulation: With patient Time For Goal Achievement: 11/20/14 Potential to Achieve Goals: Good ADL Goals Pt Will Perform Upper Body Bathing: with supervision;sitting Pt Will Perform Lower Body Dressing: with supervision;sit to/from stand Pt Will Transfer to Toilet: with modified independence;regular height toilet;ambulating Pt Will Perform Tub/Shower Transfer: Tub transfer;with supervision;ambulating;shower seat  OT Frequency: Min 2X/week   Barriers to D/C:            Co-evaluation              End of Session Equipment Utilized During Treatment: Gait belt;Cervical collar Nurse Communication: Mobility status;Precautions  Activity Tolerance: Patient tolerated treatment well;No increased pain Patient left: in chair;with call bell/phone within reach   Time: 0730-0755 OT Time Calculation (min): 25 min Charges:  OT General Charges $OT Visit: 1 Procedure OT Evaluation $Initial OT Evaluation Tier I: 1 Procedure OT Treatments $Self Care/Home Management : 8-22 mins G-Codes:    Forest Gleason 11/06/2014, 9:58 AM

## 2014-11-06 NOTE — Evaluation (Signed)
Physical Therapy Evaluation and Discharge Patient Details Name: Lauren [name] MRN: [id number] DOB: October 31, 1945 Today's Date: 11/06/2014   History of Present Illness  69 yo female s/p c3-7 posterior cervical fusion PMH: depression, diverticulitis, anxiety, psoriasis, HTN arthritis, cervical fusion 1980   Clinical Impression  Patient evaluated by Physical Therapy with no further acute PT needs identified. All education has been completed and the patient has no further questions. Demonstrates good functional abilities without loss of balance during physical therapy evaluation. Tolerates challenging dynamic gait tasks without need for physical assist and safely navigates stairs. Has 24 hour help at home as needed. See below for any follow-up Physial Therapy or equipment needs. PT is signing off. Thank you for this referral.     Follow Up Recommendations No PT follow up    Equipment Recommendations  None recommended by PT    Recommendations for Other Services       Precautions / Restrictions Precautions Precautions: Cervical Precaution Comments: reviewed Required Braces or Orthoses: Cervical Brace Cervical Brace: Soft collar;At all times Restrictions Weight Bearing Restrictions: No      Mobility  Bed Mobility Overal bed mobility: Modified Independent             General bed mobility comments: Did not require assist with log roll technique  Transfers Overall transfer level: Modified independent Equipment used: None             General transfer comment: Rises well from lowest bed setting. No sway noted.  Ambulation/Gait Ambulation/Gait assistance: Modified independent (Device/Increase time) Ambulation Distance (Feet): 525 Feet Assistive device: None Gait Pattern/deviations: Step-through pattern   Gait velocity interpretation: at or above normal speed for age/gender General Gait Details: Challenged patient with higher level balance activities including high  marching, variable speeds, quick turns, and backwards stepping. All performed without loss of balance. Minimally guarded and maintains cervical precautions throughout. No overt deficits noted with functional tasks.  Stairs Stairs: Yes Stairs assistance: Modified independent (Device/Increase time) Stair Management: One rail Right;Alternating pattern;Step to pattern;Forwards Number of Stairs: 12 General stair comments: Trialed step-to and alternating step patterns which patient tolerates well. no loss of balance noted. Use of single rail on Rt similar to home environment.  Wheelchair Mobility    Modified Rankin (Stroke Patients Only)       Balance Overall balance assessment: Independent                                           Pertinent Vitals/Pain Pain Assessment: 0-10 Pain Score: 6  Pain Location: postero Rt lateral neck Pain Descriptors / Indicators: Aching Pain Intervention(s): Monitored during session;Premedicated before session;Repositioned    Home Living Family/patient expects to be discharged to:: Private residence Living Arrangements: Spouse/significant other Available Help at Discharge: Family;Available 24 hours/day Type of Home: House Home Access: Stairs to enter Entrance Stairs-Rails: Right Entrance Stairs-Number of Steps: 9 Home Layout: One level Home Equipment: Grab bars - tub/shower;Walker - 2 wheels;Cane - quad;Crutches;Shower seat      Prior Function Level of Independence: Independent               Hand Dominance   Dominant Hand: Right    Extremity/Trunk Assessment   Upper Extremity Assessment: Defer to OT evaluation           Lower Extremity Assessment: Overall WFL for tasks assessed  Communication   Communication: No difficulties  Cognition Arousal/Alertness: Awake/alert Behavior During Therapy: WFL for tasks assessed/performed Overall Cognitive Status: Within Functional Limits for tasks assessed                       General Comments General comments (skin integrity, edema, etc.): Has strong family support at home    Exercises        Assessment/Plan    PT Assessment Patent does not need any further PT services  PT Diagnosis Acute pain   PT Problem List    PT Treatment Interventions     PT Goals (Current goals can be found in the Care Plan section) Acute Rehab PT Goals Patient Stated Goal: to go home PT Goal Formulation: All assessment and education complete, DC therapy    Frequency     Barriers to discharge        Co-evaluation               End of Session Equipment Utilized During Treatment: Cervical collar Activity Tolerance: Patient tolerated treatment well Patient left: in bed;with call bell/phone within reach Nurse Communication: Mobility status         Time: 3817-7116 PT Time Calculation (min) (ACUTE ONLY): 10 min   Charges:   PT Evaluation $Initial PT Evaluation Tier I: 1 Procedure     PT G CodesEllouise Newer 11/06/2014, 11:02 AM  Elayne Snare, [place]

## 2014-11-07 NOTE — Progress Notes (Signed)
The patient continues to progress slowly. Still having a great deal of incisional discomfort. No radiating pain. No new numbness or weakness.  Afebrile. Vitals are stable. Patient obviously uncomfortable but otherwise looks nontoxic. Motor and sensory exam stable. Dressing clean and dry.  Progressing slowly following posterior cervical fusion. Continue efforts at mobilization and pain control.

## 2014-11-08 MED ORDER — OXYCODONE-ACETAMINOPHEN 5-325 MG PO TABS: 1.0000 | ORAL_TABLET | ORAL | Status: DC | PRN

## 2014-11-08 MED ORDER — METHOCARBAMOL 500 MG PO TABS: 500.0000 mg | ORAL_TABLET | Freq: Four times a day (QID) | ORAL | Status: DC | PRN

## 2014-11-08 NOTE — Progress Notes (Signed)
Patient is being d/c home, d/c instruction given and patient verbalized understanding. Will continue to monitor.

## 2014-11-08 NOTE — Discharge Summary (Signed)
  Physician Discharge Summary  Patient ID: Lauren [name] MRN: [id number] DOB/AGE: July 20, 1945 69 y.o.  Admit date: 11/05/2014 Discharge date: 11/08/2014  Admission Diagnoses: C5-6 fracture, cervicalgia  Discharge Diagnoses: The same Active Problems:   S/P cervical spinal fusion   Discharged Condition: good  Hospital CourDr. Ronnald Ramp performed a C3-C7 posterior instrumentation and fusion on the patient on 11/05/2014.  The patient's postoperative course was unremarkable. On postoperative day #3 i.e. 11/08/2014 the patient requested discharge to home. The patient, and her husband, were given in oral and written discharge instructions. All their questions were answered. Consults:None Significant Diagnostic StnoneTreatments:C3-C7 posterior instrumentation and fusione Exam: Blood pressure 105/54, pulse 74, temperature 98.1 F (36.7 C), temperature source Oral, resp. rate 18, height 5\' 4"  (1.626 m), weight 63.957 kg (141 lb), last menstrual period 05/22/1996, SpO2 97 %.  patient is alert and pleasant. She looks well. Her dressing is clean and dry. She is moving all 4 extremities well.Disposition: Home    Medication List    ASK your doctor about these medications        amLODipine 5 MG tablet  Commonly known as:  NORVASC  Take 1 tablet (5 mg total) by mouth daily.     aspirin EC 325 MG tablet  Take 325 mg by mouth daily.     calcium carbonate 1250 (500 CA) MG chewable tablet  Commonly known as:  OS-CAL  Chew 2 tablets by mouth daily.     escitalopram 10 MG tablet  Commonly known as:  LEXAPRO  TAKE 1 TABLET EVERY DAY     ibuprofen 600 MG tablet  Commonly known as:  ADVIL,MOTRIN  Take 600 mg by mouth every 6 (six) hours as needed for fever or mild pain.     oxyCODONE-acetaminophen 5-325 MG per tablet  Commonly known as:  PERCOCET/ROXICET  Take 1 tablet by mouth every 4 (four) hours as needed for moderate pain.     polyethylene glycol packet  Commonly known as:  MIRALAX /  GLYCOLAX  Take 17 g by mouth daily.     Vitamin D (Ergocalciferol) 50000 UNITS Caps capsule  Commonly known as:  DRISDOL  Take 1 capsule (50,000 Units total) by mouth every 7 (seven) days.     zolpidem 5 MG tablet  Commonly known as:  AMBIEN  TAKE 1 TABLET BY MOUTH AT BEDTIME AS NEEDED FOR SLEEP         Signed: [name] 11/08/2014, 1:55 PM

## 2014-11-08 NOTE — Progress Notes (Signed)
Patient ID: Lauren [name], female   DOB: 27-Nov-1945, 69 y.o.   MRN: [id number] Subjective:  The patient is alert and pleasant. She is in no apparent distress. She complains of neck pain and does not want to go home.  Objective: Vital signs in last 24 hours: Temp:  [98 F (36.7 C)-98.4 F (36.9 C)] 98.1 F (36.7 C) (06/19 1021) Pulse Rate:  [73-83] 74 (06/19 1021) Resp:  [16-18] 18 (06/19 1021) BP: (105-127)/(50-63) 105/54 mmHg (06/19 1021) SpO2:  [90 %-97 %] 97 % (06/19 1021)  Intake/Output from previous day:   Intake/Output this shift:    Physical exam patient is alert and oriented. Her strength is normal.  Lab Results: No results for input(s): WBC, HGB, HCT, PLT in the last 72 hours. BMET No results for input(s): NA, K, CL, CO2, GLUCOSE, BUN, CREATININE, CALCIUM in the last 72 hours.  Studies/Results: No results found.  Assessment/Plan: Postop day #3: She may go home tomorrow.  LOS: 3 days     Lauren [name] D 11/08/2014, 11:29 AM

## 2014-11-09 ENCOUNTER — Telehealth: Payer: Self-pay | Admitting: *Deleted

## 2014-11-09 NOTE — Telephone Encounter (Signed)
Pt was on tcm list d/c 11/08/14 has  S/p cervical spinal fusion will be f/u with Neurosurgery...[name]

## 2014-11-16 ENCOUNTER — Other Ambulatory Visit (INDEPENDENT_AMBULATORY_CARE_PROVIDER_SITE_OTHER): Payer: Medicare Other

## 2014-11-16 ENCOUNTER — Ambulatory Visit (INDEPENDENT_AMBULATORY_CARE_PROVIDER_SITE_OTHER): Payer: Medicare Other | Admitting: Internal Medicine

## 2014-11-16 ENCOUNTER — Encounter: Payer: Self-pay | Admitting: Internal Medicine

## 2014-11-16 VITALS — BP 128/70 | HR 82 | Wt 140.0 lb

## 2014-11-16 DIAGNOSIS — G4700 Insomnia, unspecified: Secondary | ICD-10-CM

## 2014-11-16 DIAGNOSIS — Z981 Arthrodesis status: Secondary | ICD-10-CM | POA: Diagnosis not present

## 2014-11-16 DIAGNOSIS — R531 Weakness: Secondary | ICD-10-CM | POA: Diagnosis not present

## 2014-11-16 DIAGNOSIS — M5090 Cervical disc disorder, unspecified, unspecified cervical region: Secondary | ICD-10-CM | POA: Diagnosis not present

## 2014-11-16 LAB — CBC WITH DIFFERENTIAL/PLATELET
BASOS PCT: 0.9 % (ref 0.0–3.0)
Basophils Absolute: 0.1 10*3/uL (ref 0.0–0.1)
EOS ABS: 0.2 10*3/uL (ref 0.0–0.7)
EOS PCT: 3.1 % (ref 0.0–5.0)
HCT: 36.3 % (ref 36.0–46.0)
Hemoglobin: 12.1 g/dL (ref 12.0–15.0)
LYMPHS PCT: 22.7 % (ref 12.0–46.0)
Lymphs Abs: 1.6 10*3/uL (ref 0.7–4.0)
MCHC: 33.4 g/dL (ref 30.0–36.0)
MCV: 91.8 fl (ref 78.0–100.0)
Monocytes Absolute: 0.6 10*3/uL (ref 0.1–1.0)
Monocytes Relative: 8.7 % (ref 3.0–12.0)
NEUTROS PCT: 64.6 % (ref 43.0–77.0)
Neutro Abs: 4.6 10*3/uL (ref 1.4–7.7)
Platelets: 350 10*3/uL (ref 150.0–400.0)
RBC: 3.95 Mil/uL (ref 3.87–5.11)
RDW: 15.8 % — ABNORMAL HIGH (ref 11.5–15.5)
WBC: 7.1 10*3/uL (ref 4.0–10.5)

## 2014-11-16 LAB — BASIC METABOLIC PANEL
BUN: 13 mg/dL (ref 6–23)
CALCIUM: 9.7 mg/dL (ref 8.4–10.5)
CO2: 30 meq/L (ref 19–32)
CREATININE: 0.76 mg/dL (ref 0.40–1.20)
Chloride: 104 mEq/L (ref 96–112)
GFR: 80.03 mL/min (ref >60.00)
Glucose, Bld: 95 mg/dL (ref 70–99)
Potassium: 3.7 mEq/L (ref 3.5–5.1)
SODIUM: 140 meq/L (ref 135–145)

## 2014-11-16 LAB — URINALYSIS
Bilirubin Urine: NEGATIVE
Hgb urine dipstick: NEGATIVE
KETONES UR: NEGATIVE
Leukocytes, UA: NEGATIVE
Nitrite: NEGATIVE
PH: 6 (ref 5.0–8.0)
SPECIFIC GRAVITY, URINE: 1.015 (ref 1.000–1.030)
TOTAL PROTEIN, URINE-UPE24: NEGATIVE
URINE GLUCOSE: NEGATIVE
Urobilinogen, UA: 0.2 (ref 0.0–1.0)

## 2014-11-16 LAB — HEPATIC FUNCTION PANEL
ALBUMIN: 4.1 g/dL (ref 3.5–5.2)
ALK PHOS: 127 U/L — AB (ref 39–117)
ALT: 47 U/L — ABNORMAL HIGH (ref 0–35)
AST: 35 U/L (ref 0–37)
BILIRUBIN DIRECT: 0 mg/dL (ref 0.0–0.3)
BILIRUBIN TOTAL: 0.4 mg/dL (ref 0.2–1.2)
Total Protein: 7.5 g/dL (ref 6.0–8.3)

## 2014-11-16 MED ORDER — ZOLPIDEM TARTRATE 5 MG PO TABS: 5.0000 mg | ORAL_TABLET | Freq: Every evening | ORAL | Status: DC | PRN

## 2014-11-16 MED ORDER — TRAMADOL HCL 50 MG PO TABS: 50.0000 mg | ORAL_TABLET | Freq: Two times a day (BID) | ORAL | Status: DC | PRN

## 2014-11-16 NOTE — Patient Instructions (Signed)
Go to ER if worse 

## 2014-11-16 NOTE — Assessment & Plan Note (Signed)
6/16 - poss Rx related: Diazepam and Oxycodone - will d/c Start Tramadol prn  Potential benefits of  opioids use as well as potential risks (i.e. addiction risk, apnea etc) and complications (i.e. Somnolence, constipation and others) were explained to the patient and were aknowledged. Labs

## 2014-11-16 NOTE — Progress Notes (Signed)
Pre visit review using our clinic review tool, if applicable. No additional management support is needed unless otherwise documented below in the visit note. 

## 2014-11-16 NOTE — Assessment & Plan Note (Signed)
6/16 s/p fusion No neurologic deficit Plan as above F/u w/Dr Ronnald Ramp on Fri

## 2014-11-16 NOTE — Assessment & Plan Note (Signed)
Restarted Zolpidem prn

## 2014-11-16 NOTE — Progress Notes (Signed)
   Subjective:    Patient ID: Lauren [name], female    DOB: November 07, 1945, 69 y.o.   MRN: [id number]  HPI  Pt had a cervical fusion on 6/16 Thursday (Dr Ronnald Ramp) and was d/c'd on 6/20th. Sunday. Pt started to have falls on her knees (x4) - she is not sure why. No LOC. Pt fell last on Fri. Pt is on Oxycodone:it gives her bad dreams, knocks me out.... She stopped Valium on Fri. Pain is 10/10   Review of Systems  Constitutional: Negative for chills, activity change, appetite change, fatigue and unexpected weight change.  HENT: Negative for congestion, mouth sores and sinus pressure.   Eyes: Negative for visual disturbance.  Respiratory: Negative for cough and chest tightness.   Gastrointestinal: Negative for nausea and abdominal pain.  Genitourinary: Negative for frequency, difficulty urinating and vaginal pain.  Musculoskeletal: Positive for neck pain. Negative for back pain and gait problem.  Skin: Negative for pallor and rash.  Neurological: Negative for dizziness, tremors, weakness, numbness and headaches.  Psychiatric/Behavioral: Positive for sleep disturbance. Negative for suicidal ideas and confusion. The patient is nervous/anxious.    BP 128/70 mmHg  Pulse 82  Wt 140 lb (63.504 kg)  SpO2 95%  LMP 05/22/1996     Objective:   Physical Exam  Constitutional: She appears well-developed. No distress.  HENT:  Head: Normocephalic.  Right Ear: External ear normal.  Left Ear: External ear normal.  Nose: Nose normal.  Mouth/Throat: Oropharynx is clear and moist.  Eyes: Conjunctivae are normal. Pupils are equal, round, and reactive to light. Right eye exhibits no discharge. Left eye exhibits no discharge.  Neck: Normal range of motion. Neck supple. No JVD present. No tracheal deviation present. No thyromegaly present.  Cardiovascular: Normal rate, regular rhythm and normal heart sounds.   Pulmonary/Chest: No stridor. No respiratory distress. She has no wheezes.  Abdominal: Soft. Bowel  sounds are normal. She exhibits no distension and no mass. There is no tenderness. There is no rebound and no guarding.  Musculoskeletal: She exhibits tenderness. She exhibits no edema.  Lymphadenopathy:    She has no cervical adenopathy.  Neurological: She displays normal reflexes. No cranial nerve deficit. She exhibits normal muscle tone. Coordination normal.  Skin: No rash noted. No erythema.  Psychiatric: Her behavior is normal. Judgment and thought content normal.    Lab Results  Component Value Date   WBC 5.9 10/27/2014   HGB 12.7 10/27/2014   HCT 39.3 10/27/2014   PLT 210 10/27/2014   GLUCOSE 97 10/27/2014   CHOL 162 10/14/2014   TRIG 133.0 10/14/2014   HDL 34.10* 10/14/2014   LDLDIRECT 58.5 03/09/2014   LDLCALC 101* 10/14/2014   ALT 17 10/14/2014   AST 21 10/14/2014   NA 137 10/27/2014   K 3.7 10/27/2014   CL 98* 10/27/2014   CREATININE 0.71 10/27/2014   BUN 12 10/27/2014   CO2 28 10/27/2014   TSH 13.23* 10/14/2014   INR 0.97 10/27/2014   HGBA1C 5.5 10/14/2014         Assessment & Plan:

## 2014-11-17 ENCOUNTER — Telehealth: Payer: Self-pay | Admitting: Nurse Practitioner

## 2014-11-17 NOTE — Telephone Encounter (Signed)
Patient is calling to get her vit d results she states no one has called her and this was done in may.

## 2014-11-17 NOTE — Telephone Encounter (Signed)
Spoke with patient. Advised of message as seen below from Milford Cage, [place]. Patient is agreeable and verbalizes understanding.  Notes Recorded by Kem Boroughs, FNP on 11/03/2014 at 7:59 AM Results via my chart:  Lauren [name], the Vit D level is so much better. Went from 21 to 30. Now you may take OTC Vit D 1000 IU daily to help maintain the level. Will plan to recheck at next annual.  Routing to provider for final review. Patient agreeable to disposition. Will close encounter.

## 2014-11-24 ENCOUNTER — Ambulatory Visit: Payer: Medicare Other | Admitting: Internal Medicine

## 2014-12-07 ENCOUNTER — Ambulatory Visit: Payer: Medicare Other | Admitting: Nurse Practitioner

## 2014-12-07 ENCOUNTER — Telehealth: Payer: Self-pay | Admitting: Nurse Practitioner

## 2014-12-07 NOTE — Telephone Encounter (Addendum)
Patient called stating "Could I come in today to see Patty, my depression has gotten worse". Patient very tearful, I scheduled her for 2:30 pm .

## 2014-12-07 NOTE — Telephone Encounter (Signed)
cx stuck at another dr appt/rd pt to call back to reschedule.

## 2015-01-08 ENCOUNTER — Other Ambulatory Visit: Payer: Self-pay

## 2015-01-08 DIAGNOSIS — Z1231 Encounter for screening mammogram for malignant neoplasm of breast: Secondary | ICD-10-CM

## 2015-01-11 NOTE — Telephone Encounter (Signed)
Okay to close encounter?  °

## 2015-01-11 NOTE — Telephone Encounter (Signed)
Please call pt. To make sure she is OK - sounds like something is really troubling her.  I will be glad to see at any time.

## 2015-01-12 NOTE — Telephone Encounter (Signed)
Left message to call Kaitlyn at [phone]. 

## 2015-01-12 NOTE — Telephone Encounter (Signed)
Spoke with patient. Patient states that she had a minor surgery recently and was feeling depressed and down about recovery after. States she has continued to take her Lexapro 10 mg daily and feels much better. Declines to reschedule appointment. Advised if anything changes and she would like to be seen for follow up to give our office a call. Patient is agreeable.  Routing to provider for final review. Patient agreeable to disposition. Will close encounter.   Patient aware provider will review message and nurse will return call if any additional advice or change of disposition.

## 2015-02-15 ENCOUNTER — Ambulatory Visit: Payer: Medicare Other

## 2015-02-17 ENCOUNTER — Other Ambulatory Visit: Payer: Self-pay | Admitting: Internal Medicine

## 2015-02-17 DIAGNOSIS — I6523 Occlusion and stenosis of bilateral carotid arteries: Secondary | ICD-10-CM

## 2015-02-24 ENCOUNTER — Inpatient Hospital Stay (HOSPITAL_COMMUNITY): Admission: RE | Admit: 2015-02-24 | Payer: Medicare Other | Source: Ambulatory Visit

## 2015-03-04 ENCOUNTER — Encounter (HOSPITAL_COMMUNITY): Payer: Medicare Other

## 2015-03-04 ENCOUNTER — Ambulatory Visit (HOSPITAL_COMMUNITY)
Admission: RE | Admit: 2015-03-04 | Discharge: 2015-03-04 | Disposition: A | Discharge status: Home / Self Care | Payer: Medicare Other | Source: Ambulatory Visit | Attending: Cardiovascular Disease | Admitting: Cardiovascular Disease

## 2015-03-04 DIAGNOSIS — I6523 Occlusion and stenosis of bilateral carotid arteries: Secondary | ICD-10-CM

## 2015-03-04 DIAGNOSIS — I10 Essential (primary) hypertension: Secondary | ICD-10-CM | POA: Insufficient documentation

## 2015-03-09 ENCOUNTER — Ambulatory Visit: Payer: Medicare Other

## 2015-04-07 ENCOUNTER — Telehealth: Payer: Self-pay

## 2015-04-07 NOTE — Telephone Encounter (Signed)
Patient called to educate on Medicare Wellness apt. LVM for the patient to call back to educate and schedule for wellness visit and to call the practice or to call my line directly

## 2015-04-08 ENCOUNTER — Ambulatory Visit
Admission: RE | Admit: 2015-04-08 | Discharge: 2015-04-08 | Disposition: A | Discharge status: Home / Self Care | Payer: Medicare Other | Source: Ambulatory Visit

## 2015-04-08 DIAGNOSIS — Z1231 Encounter for screening mammogram for malignant neoplasm of breast: Secondary | ICD-10-CM

## 2015-04-13 ENCOUNTER — Other Ambulatory Visit: Payer: Self-pay | Admitting: Internal Medicine

## 2015-04-13 DIAGNOSIS — R928 Other abnormal and inconclusive findings on diagnostic imaging of breast: Secondary | ICD-10-CM

## 2015-04-19 ENCOUNTER — Other Ambulatory Visit: Payer: Self-pay

## 2015-04-19 ENCOUNTER — Other Ambulatory Visit: Payer: Self-pay | Admitting: Internal Medicine

## 2015-04-19 DIAGNOSIS — R928 Other abnormal and inconclusive findings on diagnostic imaging of breast: Secondary | ICD-10-CM

## 2015-04-19 NOTE — Telephone Encounter (Signed)
Call to intro AWV; STated that she keeps up with her wellness; Had flu shot at [facility] in October; Had mammogram and vision checked; Gyn completed; dexa completed.  Educated on Hep c and will discuss with Dr. Jenny Reichmann later during preventive around June; Deferred Hep C to June 2017;

## 2015-04-21 ENCOUNTER — Ambulatory Visit
Admission: RE | Admit: 2015-04-21 | Discharge: 2015-04-21 | Disposition: A | Discharge status: Home / Self Care | Payer: Medicare Other | Source: Ambulatory Visit | Attending: Internal Medicine | Admitting: Internal Medicine

## 2015-04-21 DIAGNOSIS — R928 Other abnormal and inconclusive findings on diagnostic imaging of breast: Secondary | ICD-10-CM

## 2015-05-26 ENCOUNTER — Other Ambulatory Visit: Payer: Self-pay | Admitting: *Deleted

## 2015-05-26 MED ORDER — ESCITALOPRAM OXALATE 10 MG PO TABS: 10.0000 mg | ORAL_TABLET | Freq: Every day | ORAL | Status: DC

## 2015-05-26 NOTE — Telephone Encounter (Signed)
Medication refill request: lexapro  Last AEX:  07/30/14 PG Next AEX: 08/02/15 PG Last MMG (if hormonal medication request): 04/21/15 [id number]:neg  Refill authorized: 09/14/14 #90/3R. Today #90/0R?

## 2015-05-27 ENCOUNTER — Telehealth: Payer: Self-pay | Admitting: Internal Medicine

## 2015-05-27 MED ORDER — ZOLPIDEM TARTRATE 5 MG PO TABS: 5.0000 mg | ORAL_TABLET | Freq: Every evening | ORAL | Status: DC | PRN

## 2015-05-27 MED ORDER — AMLODIPINE BESYLATE 5 MG PO TABS
5.0000 mg | ORAL_TABLET | Freq: Every day | ORAL | Status: DC
Start: 2015-05-27 — End: 2015-08-11

## 2015-05-27 MED ORDER — ESCITALOPRAM OXALATE 10 MG PO TABS: 10.0000 mg | ORAL_TABLET | Freq: Every day | ORAL | Status: DC

## 2015-05-27 NOTE — Telephone Encounter (Signed)
Done hardcopy to Dahlia  

## 2015-05-27 NOTE — Telephone Encounter (Signed)
Rx faxed to pharmacy  

## 2015-05-27 NOTE — Telephone Encounter (Signed)
Patient has a new insurance and uses mail order through [facility] mcr complete. Fax # [phone] ph# [phone] account # [id number]  States that we need to send RX to the new pharmacy so that mail order can run automatically amLODipine (NORVASC) 5 MG tablet [id number]  zolpidem (AMBIEN) 5 MG tablet [id number]  escitalopram (LEXAPRO) 10 MG tablet [id number]

## 2015-05-27 NOTE — Telephone Encounter (Signed)
Maintenance medication refilled, please advise on Ambien?

## 2015-06-10 ENCOUNTER — Encounter (HOSPITAL_COMMUNITY): Payer: Self-pay | Admitting: Emergency Medicine

## 2015-06-10 ENCOUNTER — Emergency Department (HOSPITAL_COMMUNITY)
Admission: EM | Admit: 2015-06-10 | Discharge: 2015-06-10 | Disposition: A | Discharge status: Home / Self Care | Payer: Medicare Other | Attending: Emergency Medicine | Admitting: Emergency Medicine

## 2015-06-10 DIAGNOSIS — Z79899 Other long term (current) drug therapy: Secondary | ICD-10-CM | POA: Insufficient documentation

## 2015-06-10 DIAGNOSIS — M1990 Unspecified osteoarthritis, unspecified site: Secondary | ICD-10-CM | POA: Diagnosis not present

## 2015-06-10 DIAGNOSIS — I10 Essential (primary) hypertension: Secondary | ICD-10-CM | POA: Insufficient documentation

## 2015-06-10 DIAGNOSIS — F329 Major depressive disorder, single episode, unspecified: Secondary | ICD-10-CM | POA: Diagnosis not present

## 2015-06-10 DIAGNOSIS — M791 Myalgia, unspecified site: Secondary | ICD-10-CM

## 2015-06-10 DIAGNOSIS — Z7982 Long term (current) use of aspirin: Secondary | ICD-10-CM | POA: Insufficient documentation

## 2015-06-10 DIAGNOSIS — F419 Anxiety disorder, unspecified: Secondary | ICD-10-CM | POA: Diagnosis not present

## 2015-06-10 DIAGNOSIS — M542 Cervicalgia: Secondary | ICD-10-CM

## 2015-06-10 MED ORDER — BUPIVACAINE-EPINEPHRINE 0.25% -1:200000 IJ SOLN
20.0000 mL | Freq: Once | INTRAMUSCULAR | Status: AC
  Administered 2015-06-10: 20 mL
  Filled 2015-06-10: qty 20

## 2015-06-10 MED ORDER — LIDOCAINE 5 % EX PTCH: 1.0000 | MEDICATED_PATCH | CUTANEOUS | Status: DC

## 2015-06-10 NOTE — Discharge Instructions (Signed)
Musculoskeletal Pain Musculoskeletal pain is muscle and boney aches and pains. These pains can occur in any part of the body. Your caregiver may treat you without knowing the cause of the pain. They may treat you if blood or urine tests, X-rays, and other tests were normal.  CAUSES There is often not a definite cause or reason for these pains. These pains may be caused by a type of germ (virus). The discomfort may also come from overuse. Overuse includes working out too hard when your body is not fit. Boney aches also come from weather changes. Bone is sensitive to atmospheric pressure changes. HOME CARE INSTRUCTIONS   Ask when your test results will be ready. Make sure you get your test results.  Only take over-the-counter or prescription medicines for pain, discomfort, or fever as directed by your caregiver. If you were given medications for your condition, do not drive, operate machinery or power tools, or sign legal documents for 24 hours. Do not drink alcohol. Do not take sleeping pills or other medications that may interfere with treatment.  Continue all activities unless the activities cause more pain. When the pain lessens, slowly resume normal activities. Gradually increase the intensity and duration of the activities or exercise.  During periods of severe pain, bed rest may be helpful. Lay or sit in any position that is comfortable.  Putting ice on the injured area.  Put ice in a bag.  Place a towel between your skin and the bag.  Leave the ice on for 15 to 20 minutes, 3 to 4 times a day.  Follow up with your caregiver for continued problems and no reason can be found for the pain. If the pain becomes worse or does not go away, it may be necessary to repeat tests or do additional testing. Your caregiver may need to look further for a possible cause. SEEK IMMEDIATE MEDICAL CARE IF:  You have pain that is getting worse and is not relieved by medications.  You develop chest pain  that is associated with shortness or breath, sweating, feeling sick to your stomach (nauseous), or throw up (vomit).  Your pain becomes localized to the abdomen.  You develop any new symptoms that seem different or that concern you. MAKE SURE YOU:   Understand these instructions.  Will watch your condition.  Will get help right away if you are not doing well or get worse.   This information is not intended to replace advice given to you by your health care provider. Make sure you discuss any questions you have with your health care provider.   Document Released: 05/08/2005 Document Revised: 07/31/2011 Document Reviewed: 01/10/2013 Elsevier Interactive Patient Education 2016 [place].  Trigger Point Injection Trigger points are areas where you have muscle pain. A trigger point injection is a shot given in the trigger point to relieve that pain. A trigger point might feel like a knot in your muscle. It hurts to press on a trigger point. Sometimes the pain spreads out (radiates) to other parts of the body. For example, pressing on a trigger point in your shoulder might cause pain in your arm or neck. You might have one trigger point. Or, you might have more than one. People often have trigger points in their upper back and lower back. They also occur often in the neck and shoulders. Pain from a trigger point lasts for a long time. It can make it hard to keep moving. You might not be able to do the  exercise or physical therapy that could help you deal with the pain. A trigger point injection may help. It does not work for everyone. But, it may relieve your pain for a few days or a few months. A trigger point injection does not cure long-lasting (chronic) pain. LET YOUR CAREGIVER KNOW ABOUT:  Any allergies (especially to latex, lidocaine, or steroids).  Blood-thinning medicines that you take. These drugs can lead to bleeding or bruising after an injection. They  include:  Aspirin.  Ibuprofen.  Clopidogrel.  Warfarin.  Other medicines you take. This includes all vitamins, herbs, eyedrops, over-the-counter medicines, and creams.  Use of steroids.  Recent infections.  Past problems with numbing medicines.  Bleeding problems.  Surgeries you have had.  Other health problems. RISKS AND COMPLICATIONS A trigger point injection is a safe treatment. However, problems may develop, such as:  Minor side effects usually go away in 1 to 2 days. These may include:  Soreness.  Bruising.  Stiffness.  More serious problems are rare. But, they may include:  Bleeding under the skin (hematoma).  Skin infection.  Breaking off of the needle under your skin.  Lung puncture.  The trigger point injection may not work for you. BEFORE THE PROCEDURE You may need to stop taking any medicine that thins your blood. This is to prevent bleeding and bruising. Usually these medicines are stopped several days before the injection. No other preparation is needed. PROCEDURE  A trigger point injection can be given in your caregiver's office or in a clinic. Each injection takes 2 minutes or less.  Your caregiver will feel for trigger points. The caregiver may use a marker to circle the area for the injection.  The skin over the trigger point will be washed with a germ-killing (antiseptic) solution.  The caregiver pinches the spot for the injection.  Then, a very thin needle is used for the shot. You may feel pain or a twitching feeling when the needle enters the trigger point.  A numbing solution may be injected into the trigger point. Sometimes a drug to keep down swelling, redness, and warmth (inflammation) is also injected.  Your caregiver moves the needle around the trigger zone until the tightness and twitching goes away.  After the injection, your caregiver may put gentle pressure over the injection site.  Then it is covered with a  bandage. AFTER THE PROCEDURE  You can go right home after the injection.  The bandage can be taken off after a few hours.  You may feel sore and stiff for 1 to 2 days.  Go back to your regular activities slowly. Your caregiver may ask you to stretch your muscles. Do not do anything that takes extra energy for a few days.  Follow your caregiver's instructions to manage and treat other pain.   This information is not intended to replace advice given to you by your health care provider. Make sure you discuss any questions you have with your health care provider.   Document Released: 04/27/2011 Document Revised: 09/02/2012 Document Reviewed: 04/27/2011 Elsevier Interactive Patient Education [employer].

## 2015-06-10 NOTE — ED Notes (Signed)
Contacted pharmacy to send Marcaine.

## 2015-06-10 NOTE — ED Provider Notes (Signed)
CSN: [id number]     Arrival date & time 06/10/15  1014 History  By signing my name below, I, Essence Howell, attest that this documentation has been prepared under the direction and in the presence of Margarita Mail, PA-C Electronically Signed: Ladene Artist, ED Scribe 06/10/2015 at 12:03 PM.   Chief Complaint  Patient presents with  . Neck Pain   The history is provided by the patient. No language interpreter was used.   HPI Comments: Lauren [name] is a 70 y.o. female, with a h/o cervical disc disease, who presents to the Emergency Department complaining of constant throbbing left-sided neck pain for the past 4 weeks. Pain is exacerbated with bending. Pt was seen by orthopedist Dr. Luan Pulling and received a trigger point injection in September with temporary relief. She has also tried ice, heat, extra strength Bengay, Icy Hot, Advil and diazepam with temporary relief. Pt denies numbness/tinlging in upper extremities and weakness in upper extremities. She reports h/o posterior cervical fusion in June 2016 by Dr. Sherley Bounds. Pt has an upcoming appointment with Dr. Luan Pulling next week.   Past Medical History  Diagnosis Date  . Depression   . Diverticulitis   . Diverticulosis   . Anxiety   . Psoriasis   . Frequent headaches   . Cervical disc disease   . Carotid stenosis 10/08/2013  . Impaired glucose tolerance 10/08/2013    normal HgA1C  . Hypertension   . Arthritis    Past Surgical History  Procedure Laterality Date  . Cholecystectomy    . Cervical fusion      approx 1980  . Total abdominal hysterectomy  1998    Fibroid  . Posterior cervical fusion/foraminotomy N/A 11/05/2014    Procedure: Cervical four to Cervical seven  Posterior Cervical Fusion with lateral mass fixation;  Surgeon: Eustace Moore, MD;  Location: [facility];  Service: Neurosurgery;  Laterality: N/A;   C4 - C7 Posterior Cervical Fusion with lateral mass fixation   Family History  Problem Relation Age of Onset  .  Diabetes Brother   . Heart disease Mother   . Diabetes Mother   . Cervical cancer Mother   . Heart attack Mother   . Lung cancer Father   . Alcoholism Father   . Throat cancer Brother   . Cancer Other     lung cancer  . Heart disease Other   . Hypertension Other   . Arthritis Other   . Alcohol abuse Other    Social History  Substance Use Topics  . Smoking status: Never Smoker   . Smokeless tobacco: Never Used  . Alcohol Use: Yes     Comment: social   OB History    Gravida Para Term Preterm AB TAB SAB Ectopic Multiple Living   2 2 2       2      Review of Systems  Musculoskeletal: Positive for neck pain.  Neurological: Negative for weakness and numbness.   Allergies  Celebrex  Home Medications   Prior to Admission medications   Medication Sig Start Date End Date Taking? Authorizing Provider  amLODipine (NORVASC) 5 MG tablet Take 1 tablet (5 mg total) by mouth daily. 05/27/15 05/26/16  Biagio Borg, MD  aspirin EC 325 MG tablet Take 325 mg by mouth daily.    Historical Provider, MD  calcium carbonate (OS-CAL) 1250 (500 CA) MG chewable tablet Chew 2 tablets by mouth daily.    Historical Provider, MD  escitalopram (LEXAPRO) 10 MG  tablet Take 1 tablet (10 mg total) by mouth daily. 05/27/15   Biagio Borg, MD  polyethylene glycol [facility] / Floria Raveling) packet Take 17 g by mouth daily.    Historical Provider, MD  traMADol (ULTRAM) 50 MG tablet Take 1-2 tablets (50-100 mg total) by mouth 2 (two) times daily as needed for moderate pain or severe pain. 11/16/14   Aleksei Plotnikov V, MD  Vitamin D, Ergocalciferol, (DRISDOL) 50000 UNITS CAPS capsule Take 1 capsule (50,000 Units total) by mouth every 7 (seven) days. Patient taking differently: Take 50,000 Units by mouth every 7 (seven) days. wednesday 08/13/14   Kem Boroughs, FNP  zolpidem (AMBIEN) 5 MG tablet Take 1 tablet (5 mg total) by mouth at bedtime as needed. for sleep 05/27/15   Biagio Borg, MD   BP 122/66 mmHg  Pulse 87   Temp(Src) 98.2 F (36.8 C) (Oral)  Resp 20  SpO2 100%  LMP 05/22/1996 Physical Exam  Constitutional: She is oriented to person, place, and time. She appears well-developed and well-nourished. No distress.  HENT:  Head: Normocephalic and atraumatic.  Eyes: Conjunctivae and EOM are normal.  Neck: Neck supple. No tracheal deviation present.  L-sided paraspinal spasms. Well-healed midline surgical scar without midline tenderness. No sign of infection. ROM limited with flexion. Holding head in guarded position.   Cardiovascular: Normal rate.   Pulmonary/Chest: Effort normal. No respiratory distress.  Musculoskeletal: Normal range of motion.  Neurological: She is alert and oriented to person, place, and time.  No UE weakness. NVI.   Skin: Skin is warm and dry.  Psychiatric: She has a normal mood and affect. Her behavior is normal.  Nursing note and vitals reviewed.  ED Course  Procedures (including critical care time) DIAGNOSTIC STUDIES: Oxygen Saturation is 100% on RA, normal by my interpretation.    COORDINATION OF CARE: 10:51 AM-Discussed treatment plan which includes trigger point injection pt at bedside and pt agreed to plan.   Labs Review Labs Reviewed - No data to display  Imaging Review No results found.   EKG Interpretation None      Trigger Point Injection Date/Time:06/10/2015 12:12 PM Performed by: Margarita Mail Authorized by: Margarita Mail Consent: Verbal consent obtained. Risks and benefits: risks, benefits and alternatives were discussed Consent given by: patient Patient identity confirmed: provided patient demograhics Preparation: Patient was prepped and draped in the usual sterile fashion. Local anesthesia used: yes Anesthesia: local infiltration Local anesthetic: marcaine 0.25% w/ep  Anesthetic total: 10 Patient tolerance: Patient tolerated the procedure well with no immediate complications    MDM   Final diagnoses:  None  Musculoskeletal  neck pain. Significant relief with trigger point injection. Discharged with lidocaine patch for neck. Pt has pain meds and muscle relaxants at home if needed. Will f/u with Dr. Luan Pulling next week.      I personally performed the services described in this documentation, which was scribed in my presence. The recorded information has been reviewed and is accurate.      Margarita Mail, PA-C 06/10/15 1212  Gareth Morgan, MD 06/11/15 [phone]

## 2015-06-10 NOTE — ED Notes (Signed)
Pt sts told to come here for cortisone injection for left sided neck pain; pt sts receiving "trigger shots" for same and not helping; pt sts had sx on neck but does not feel like it has anything to do with her sx

## 2015-06-10 NOTE — ED Notes (Signed)
Declined W/C at D/C and was escorted to lobby by RN. 

## 2015-06-15 DIAGNOSIS — M791 Myalgia: Secondary | ICD-10-CM | POA: Diagnosis not present

## 2015-06-15 DIAGNOSIS — M542 Cervicalgia: Secondary | ICD-10-CM | POA: Diagnosis not present

## 2015-07-07 DIAGNOSIS — M542 Cervicalgia: Secondary | ICD-10-CM | POA: Diagnosis not present

## 2015-07-14 ENCOUNTER — Encounter: Payer: Self-pay | Admitting: Neurology

## 2015-07-14 ENCOUNTER — Ambulatory Visit (INDEPENDENT_AMBULATORY_CARE_PROVIDER_SITE_OTHER): Payer: Medicare Other | Admitting: Neurology

## 2015-07-14 VITALS — BP 136/72 | HR 66 | Ht 64.0 in | Wt 147.0 lb

## 2015-07-14 DIAGNOSIS — G243 Spasmodic torticollis: Secondary | ICD-10-CM | POA: Diagnosis not present

## 2015-07-14 DIAGNOSIS — S12491S Other nondisplaced fracture of fifth cervical vertebra, sequela: Secondary | ICD-10-CM

## 2015-07-14 DIAGNOSIS — Z981 Arthrodesis status: Secondary | ICD-10-CM

## 2015-07-14 MED ORDER — DULOXETINE HCL 30 MG PO CPEP: 30.0000 mg | ORAL_CAPSULE | Freq: Every day | ORAL | Status: DC

## 2015-07-14 NOTE — Progress Notes (Signed)
PATIENT: Lauren [name] DOB: December 13, 1945  Chief Complaint  Patient presents with  . Neck Pain/Scalene Spasms    She here with her husband, Lauren [name], to discuss possible Botox injections for her left-sided scalene spasms.  She has failed relief with multiple medications and trigger point injections.     HISTORICAL  Lauren [name] is a 70 years old right-handed female, accompanied by her husband, seen in refer by pain management Dr. Clydell [name], primary care physician is Dr. Cathlean [name] for evaluation of left-sided neck pain, muscle spasm, failed to improve with trigger point injection  She had a history of hypertension, glucose intolerance, depression, cervical fusion surgery twice, initial was in 1980, she presented with left cervical radicular pain, surgery did help her symptoms, most recent one was June of 2016 by Dr. Ronnald [name],   This patient underwent a ACDF without plate many years ago. She fell, in August 30 2014 with a fracture through the fusion at C5-6. Initially she was up served for few weeks, her neck pain continued to get progressively worse, X-rays showed no significant healing of the fracture. Eventually she had a posterior cervical fusion C3-C7 bilaterally utilizing morcellized allograf, [place] lateral mass fixation C3-7 inclusive utilizing Nuvasive lateral mass screws  She recoved well from surgical site, but since her fall, she had constant left lateral neck muscle spasm, 7 out of 10 constant achy pain, over the past few months, she was under pain management Dr. Luan [name], has received trigger point injection, lidocaine patch, without helping her symptoms, she has to lie down every 1 or 2 hours to alleviate the pain, take frequent daily ibuprofen, she has tried different pain medications, trazodone, hydrocodone causes visual hallucinations, she also complains of worsening depression, tearful during today's interview, her constant moderate to severe left neck pain has caused  interruption in her daily activity, she is now taking Lexapro 10 mg daily   I also personally reviewed CAT scan of cervical spine in April 2016 prior to her surgery: Nondisplaced atypical fractures of the fused C5-C6 segment. The fractures are more visible in the posterior elements than across the vertebral body. C4 through C7 cervical fusion with probable C5-C6 corpectomy and strut graft.  CAT scan of the brain showed no significant abnormality  REVIEW OF SYSTEMS: Full 14 system review of systems performed and notable only for depression, decreased energy, hallucinations, insomnia, confusion, headaches, slurred speech  ALLERGIES: Allergies  Allergen Reactions  . Celebrex [Celecoxib] Itching and Rash    HOME MEDICATIONS: Current Outpatient Prescriptions  Medication Sig Dispense Refill  . amLODipine (NORVASC) 5 MG tablet Take 1 tablet (5 mg total) by mouth daily. 90 tablet 1  . aspirin EC 325 MG tablet Take 325 mg by mouth daily.    . calcium carbonate (OS-CAL) 1250 (500 CA) MG chewable tablet Chew 2 tablets by mouth daily.    Marland Kitchen escitalopram (LEXAPRO) 10 MG tablet Take 1 tablet (10 mg total) by mouth daily. 90 tablet 1  . lidocaine (LIDODERM) 5 % Place 1 patch onto the skin daily. Remove & Discard patch within 12 hours or as directed by MD 30 patch 0  . polyethylene glycol (MIRALAX / GLYCOLAX) packet Take 17 g by mouth daily.    . Vitamin D, Ergocalciferol, (DRISDOL) 50000 UNITS CAPS capsule Take 1 capsule (50,000 Units total) by mouth every 7 (seven) days. (Patient taking differently: Take 50,000 Units by mouth every 7 (seven) days. wednesday) 12 capsule 0  . zolpidem (AMBIEN) 5 MG tablet  Take 1 tablet (5 mg total) by mouth at bedtime as needed. for sleep 30 tablet 1   No current facility-administered medications for this visit.    PAST MEDICAL HISTORY: Past Medical History  Diagnosis Date  . Depression   . Diverticulitis   . Diverticulosis   . Anxiety   . Psoriasis   . Frequent  headaches   . Cervical disc disease   . Carotid stenosis 10/08/2013  . Impaired glucose tolerance 10/08/2013    normal HgA1C  . Hypertension   . Arthritis   . Neck pain     PAST SURGICAL HISTORY: Past Surgical History  Procedure Laterality Date  . Cholecystectomy    . Cervical fusion      approx 1980  . Total abdominal hysterectomy  1998    Fibroid  . Posterior cervical fusion/foraminotomy N/A 11/05/2014    Procedure: Cervical four to Cervical seven  Posterior Cervical Fusion with lateral mass fixation;  Surgeon: Eustace Moore, MD;  Location: [place] [facility];  Service: Neurosurgery;  Laterality: N/A;   C4 - C7 Posterior Cervical Fusion with lateral mass fixation    FAMILY HISTORY: Family History  Problem Relation Age of Onset  . Diabetes Brother   . Heart disease Mother   . Diabetes Mother   . Cervical cancer Mother   . Heart attack Mother   . Lung cancer Father   . Alcoholism Father   . Throat cancer Brother   . Cancer Other     lung cancer  . Heart disease Other   . Hypertension Other   . Arthritis Other   . Alcohol abuse Other     SOCIAL HISTORY:  Social History   Social History  . Marital Status: Married    Spouse Name: N/A  . Number of Children: 2  . Years of Education: 12   Occupational History  . retired    Social History Main Topics  . Smoking status: Never Smoker   . Smokeless tobacco: Never Used  . Alcohol Use: No  . Drug Use: No  . Sexual Activity:    Partners: Male   Other Topics Concern  . Not on file   Social History Narrative   Lives at home with husband and son.   Right-handed.   2 cups caffeine daily.     PHYSICAL EXAM   Filed Vitals:   07/14/15 1015  BP: 136/72  Pulse: 66  Height: 5\' 4"  (1.626 m)  Weight: 147 lb (66.679 kg)    Not recorded      Body mass index is 25.22 kg/(m^2).  PHYSICAL EXAMNIATION:  Gen: NAD, conversant, well nourised, obese, well groomed                     Cardiovascular: Regular rate rhythm,  no peripheral edema, warm, nontender. Eyes: Conjunctivae clear without exudates or hemorrhage Neck: Supple, no carotid bruise. Pulmonary: Clear to auscultation bilaterally   NEUROLOGICAL EXAM:  MENTAL STATUS: Speech:    Speech is normal; fluent and spontaneous with normal comprehension.  Cognition:     Orientation to time, place and person     Normal recent and remote memory     Normal Attention span and concentration     Normal Language, naming, repeating,spontaneous speech     Fund of knowledge   CRANIAL NERVES: Mild retrocollis, right shoulder elevation, left tilt , tenderness of left cervical paraspinal, nuchal area upon deep palpitation CN II: Visual fields are full to confrontation. Fundoscopic  exam is normal with sharp discs and no vascular changes. Pupils are round equal and briskly reactive to light. CN III, IV, VI: extraocular movement are normal. No ptosis. CN V: Facial sensation is intact to pinprick in all 3 divisions bilaterally. Corneal responses are intact.  CN VII: Face is symmetric with normal eye closure and smile. CN VIII: Hearing is normal to rubbing fingers CN IX, X: Palate elevates symmetrically. Phonation is normal. CN XI: Head turning and shoulder shrug are intact CN XII: Tongue is midline with normal movements and no atrophy.  MOTOR: There is no pronator drift of out-stretched arms. Muscle bulk and tone are normal. Muscle strength is normal.  REFLEXES: Reflexes are 2+ and symmetric at the biceps, triceps, knees, and ankles. Plantar responses are flexor.  SENSORY: Intact to light touch, pinprick, position sense, and vibration sense are intact in fingers and toes.  COORDINATION: Rapid alternating movements and fine finger movements are intact. There is no dysmetria on finger-to-nose and heel-knee-shin.    GAIT/STANCE: Posture is normal. Gait is steady with normal steps, base, arm swing, and turning. Heel and toe walking are normal. Tandem gait is  normal.  Romberg is absent.   DIAGNOSTIC DATA (LABS, IMAGING, TESTING) - I reviewed patient records, labs, notes, testing and imaging myself where available.   ASSESSMENT AND PLAN  Lauren [name] is a 70 y.o. female    Cervical dystonia, Left neck pain   She has persistent left cervical pain, mild retrocollis, mild right shoulder elevation, left tilt  EMG guided xeomin injection, asking for 400 units, will use 200 units upon initial injection  I also suggested heating pad,   History of cervical  C3 to C7 allograft fusion Depression   Add on Cymbalta 30 mg daily    Marcial Pacas, M.D. Ph.D.  [facility] [address], [place], [place] [address] Ph: [phone] Fax: [phone]  CC: Pain management Dr. Urbano Heir ureters s, primary care physician is Dr. Cathlean [name]

## 2015-07-30 ENCOUNTER — Telehealth: Payer: Self-pay | Admitting: Nurse Practitioner

## 2015-07-30 ENCOUNTER — Ambulatory Visit (INDEPENDENT_AMBULATORY_CARE_PROVIDER_SITE_OTHER): Payer: Medicare Other | Admitting: Obstetrics and Gynecology

## 2015-07-30 ENCOUNTER — Encounter: Payer: Self-pay | Admitting: Obstetrics and Gynecology

## 2015-07-30 VITALS — BP 120/70 | HR 80 | Ht 65.0 in | Wt 147.0 lb

## 2015-07-30 DIAGNOSIS — N939 Abnormal uterine and vaginal bleeding, unspecified: Secondary | ICD-10-CM

## 2015-07-30 DIAGNOSIS — N952 Postmenopausal atrophic vaginitis: Secondary | ICD-10-CM | POA: Diagnosis not present

## 2015-07-30 MED ORDER — ESTROGENS, CONJUGATED 0.625 MG/GM VA CREA: 1.0000 | TOPICAL_CREAM | Freq: Every day | VAGINAL | Status: DC

## 2015-07-30 MED ORDER — ESTROGENS, CONJUGATED 0.625 MG/GM VA CREA: TOPICAL_CREAM | VAGINAL | Status: DC

## 2015-07-30 NOTE — Telephone Encounter (Signed)
Patient reports small amount of vaginal bleeding occurred after intercourse one week ago then resolved. Has restarted today, bright red noted with washing but can still see blood up in the vagina.   Patient is very anxious, states her mother had cervical cancer and she is very worried about this onset of bright vaginal bleeding. Denies pain. Appointment scheduled for 12:45 today with Dr Quincy Simmonds.   Routing to provider for final review. Patient agreeable to disposition. Will close encounter.

## 2015-07-30 NOTE — Progress Notes (Signed)
Patient ID: Lauren [name], female   DOB: January 25, 1946, 70 y.o.   MRN: [id number] GYNECOLOGY  VISIT   HPI: 70 y.o.   Married  Caucasian  female   G2P2002 with Patient's last menstrual period was 05/22/1996.   here for vaginal bleeding which began after intercourse last week.  Patient also noted blood in panties several times and a small clot. Worried about cervical cancer, which her mother had.  TAH/BSO for fibroid -1998.  Had bleeding with intercourse one week ago followed by vaginal odor which has resolved. Now noted bleeding with wiping after using bathroom. Believes this is vaginal in origin.  Used vaginal moisturizer.    Denies vaginal burning and dysuria.   Recently shaved her vulva in preparation for an upcoming dermatology visit.   Used estrogen in the past without problem.  No current estrogen use.  GYNECOLOGIC HISTORY: Patient's last menstrual period was 05/22/1996. Contraception:Hysterectomy Menopausal hormone therapy: none Last mammogram: 04-09-15--see Epic Last pap smear: 03-17-09 Neg        OB History    Gravida Para Term Preterm AB TAB SAB Ectopic Multiple Living   2 2 2       2          Patient Active Problem List   Diagnosis Date Noted  . Cervical dystonia 07/14/2015  . Weakness generalized 11/16/2014  . Insomnia 11/16/2014  . S/P cervical spinal fusion 11/05/2014  . Preop exam for internal medicine 10/14/2014  . Essential hypertension 10/14/2014  . Fracture, cervical vertebra ([place]) 08/30/2014  . Preventative health care 10/08/2013  . Impaired glucose tolerance 10/08/2013  . Carotid stenosis 10/08/2013  . Depression   . Diverticulitis   . Anxiety   . Psoriasis   . Cervical disc disease   . Diverticulitis of colon (without mention of hemorrhage) 05/10/2013    Past Medical History  Diagnosis Date  . Depression   . Diverticulitis   . Diverticulosis   . Anxiety   . Psoriasis   . Frequent headaches   . Cervical disc disease   . Carotid  stenosis 10/08/2013  . Impaired glucose tolerance 10/08/2013    normal HgA1C  . Hypertension   . Arthritis   . Neck pain     Past Surgical History  Procedure Laterality Date  . Cholecystectomy    . Cervical fusion      approx 1980  . Total abdominal hysterectomy  1998    Fibroid  . Posterior cervical fusion/foraminotomy N/A 11/05/2014    Procedure: Cervical four to Cervical seven  Posterior Cervical Fusion with lateral mass fixation;  Surgeon: Eustace Moore, MD;  Location: [facility];  Service: Neurosurgery;  Laterality: N/A;   C4 - C7 Posterior Cervical Fusion with lateral mass fixation    Current Outpatient Prescriptions  Medication Sig Dispense Refill  . amLODipine (NORVASC) 5 MG tablet Take 1 tablet (5 mg total) by mouth daily. 90 tablet 1  . aspirin EC 325 MG tablet Take 325 mg by mouth daily.    . calcium carbonate (OS-CAL) 1250 (500 CA) MG chewable tablet Chew 2 tablets by mouth daily.    . DULoxetine (CYMBALTA) 30 MG capsule Take 1 capsule (30 mg total) by mouth daily. 30 capsule 11  . escitalopram (LEXAPRO) 10 MG tablet Take 1 tablet (10 mg total) by mouth daily. 90 tablet 1  . lidocaine (LIDODERM) 5 % Place 1 patch onto the skin daily. Remove & Discard patch within 12 hours or as directed by MD 30 patch  0  . polyethylene glycol (MIRALAX / GLYCOLAX) packet Take 17 g by mouth daily.    . Vitamin D, Ergocalciferol, (DRISDOL) 50000 UNITS CAPS capsule Take 1 capsule (50,000 Units total) by mouth every 7 (seven) days. (Patient taking differently: Take 50,000 Units by mouth every 7 (seven) days. wednesday) 12 capsule 0  . zolpidem (AMBIEN) 5 MG tablet Take 1 tablet (5 mg total) by mouth at bedtime as needed. for sleep 30 tablet 1   No current facility-administered medications for this visit.     ALLERGIES: Celebrex  Family History  Problem Relation Age of Onset  . Diabetes Brother   . Heart disease Mother   . Diabetes Mother   . Cervical cancer Mother   . Heart attack  Mother   . Lung cancer Father   . Alcoholism Father   . Throat cancer Brother   . Cancer Other     lung cancer  . Heart disease Other   . Hypertension Other   . Arthritis Other   . Alcohol abuse Other     Social History   Social History  . Marital Status: Married    Spouse Name: N/A  . Number of Children: 2  . Years of Education: 12   Occupational History  . retired    Social History Main Topics  . Smoking status: Never Smoker   . Smokeless tobacco: Never Used  . Alcohol Use: No  . Drug Use: No  . Sexual Activity:    Partners: Male    Birth Control/ Protection: Surgical     Comment: Hyst   Other Topics Concern  . Not on file   Social History Narrative   Lives at home with husband and son.   Right-handed.   2 cups caffeine daily.    ROS:  Pertinent items are noted in HPI.  PHYSICAL EXAMINATION:    BP 120/70 mmHg  Pulse 80  Ht 5\' 5"  (1.651 m)  Wt 147 lb (66.679 kg)  BMI 24.46 kg/m2  LMP 05/22/1996    General appearance: alert, cooperative and appears stated age   Pelvic: External genitalia:  no lesions              Urethra:  normal appearing urethra with no masses, tenderness or lesions              Bartholins and Skenes: normal                 Vagina: atrophy.  Hymen friable and bleeding, more on left than on right.  (No active bleeding requiring suturing.)              Cervix: absent               Bimanual Exam:  Uterus:  uterus absent              Adnexa: normal adnexa and no mass, fullness, tenderness               Chaperone was present for exam.  ASSESSMENT  Status post TAH/BSO. Vaginal bleeding from atrophy.   PLAN  Counseled regarding atrophy of vagina.  Premarin vaginal cream 1/2 gram pv at hs for 2 weeks.  Discussed risks of DVT, PE, MI, stroke, breast cancer.  Coupon given for Premarin. Avoid intercourse at this time.  Avoid irritants to bottom.   Recheck in 2 weeks.    An After Visit Summary was printed and given to the  patient.  __15______ minutes face to  face time of which over 50% was spent in counseling.

## 2015-07-30 NOTE — Telephone Encounter (Signed)
Patient called and said, "I need to come in for a visit this morning. I am bleeding out of my vagina and I am not supposed to."   Paper chart to triage.

## 2015-08-02 ENCOUNTER — Ambulatory Visit: Payer: Medicare Other | Admitting: Nurse Practitioner

## 2015-08-06 ENCOUNTER — Encounter: Payer: Self-pay | Admitting: Obstetrics and Gynecology

## 2015-08-06 ENCOUNTER — Ambulatory Visit (INDEPENDENT_AMBULATORY_CARE_PROVIDER_SITE_OTHER): Payer: Medicare Other | Admitting: Obstetrics and Gynecology

## 2015-08-06 ENCOUNTER — Telehealth: Payer: Self-pay | Admitting: Obstetrics and Gynecology

## 2015-08-06 VITALS — BP 120/72 | HR 76 | Ht 65.0 in | Wt 146.6 lb

## 2015-08-06 DIAGNOSIS — R319 Hematuria, unspecified: Secondary | ICD-10-CM | POA: Diagnosis not present

## 2015-08-06 DIAGNOSIS — R350 Frequency of micturition: Secondary | ICD-10-CM | POA: Diagnosis not present

## 2015-08-06 LAB — POCT URINALYSIS DIPSTICK
BILIRUBIN UA: NEGATIVE
Glucose, UA: NEGATIVE
KETONES UA: NEGATIVE
Nitrite, UA: NEGATIVE
PH UA: 5
PROTEIN UA: 8
Urobilinogen, UA: NEGATIVE

## 2015-08-06 MED ORDER — NITROFURANTOIN MONOHYD MACRO 100 MG PO CAPS: 100.0000 mg | ORAL_CAPSULE | Freq: Two times a day (BID) | ORAL | Status: DC

## 2015-08-06 NOTE — Telephone Encounter (Signed)
Spoke with patient. She c/o two days of urinary frequency and feeling like she is unable to fully empty her bladder. When she empties her bladder, she states "it's just dribbling."  Discussed with patient UTI symptoms. Patient denies flank pain or fevers.  Has been using Premarin vaginal cream daily as instructed.   Office visit recommended to rule out UTI. Patient agreeable. Office visit today at 1300 with Dr. Quincy Simmonds.  Routing to provider for final review. Patient agreeable to disposition. Will close encounter.

## 2015-08-06 NOTE — Patient Instructions (Signed)

## 2015-08-06 NOTE — Progress Notes (Signed)
Patient ID: Lauren [name], female   DOB: 1945-09-03, 70 y.o.   MRN: [id number] GYNECOLOGY  VISIT   HPI: 70 y.o.   Married  Caucasian  female   G2P2002 with Patient's last menstrual period was 05/22/1996.   here for urinary frequency/urgency and lower pelvic pressure.   No dysuria.   Never had a prior UTI or pyelo.  Started Premarin vaginal cream after visit on 07/30/15 for hymenal abrasion following intercourse. Tissue consistent with atrophy.  Using Premarin nightly.  Had a little bleeding on 3/15 with wiping.   Denies vulvar irritation.  Urine Dip:1+WBCs, 1+RBCs   GYNECOLOGIC HISTORY: Patient's last menstrual period was 05/22/1996. Contraception:Hysterectomy Menopausal hormone therapy: Premarin vaginal cream 1/2 gm pv hs for 2 weeks. Last mammogram: 04-09-15--see Epic Last pap smear: 03-07-09 Neg        OB History    Gravida Para Term Preterm AB TAB SAB Ectopic Multiple Living   2 2 2       2          Patient Active Problem List   Diagnosis Date Noted  . Cervical dystonia 07/14/2015  . Weakness generalized 11/16/2014  . Insomnia 11/16/2014  . S/P cervical spinal fusion 11/05/2014  . Preop exam for internal medicine 10/14/2014  . Essential hypertension 10/14/2014  . Fracture, cervical vertebra ([place]) 08/30/2014  . Preventative health care 10/08/2013  . Impaired glucose tolerance 10/08/2013  . Carotid stenosis 10/08/2013  . Depression   . Diverticulitis   . Anxiety   . Psoriasis   . Cervical disc disease   . Diverticulitis of colon (without mention of hemorrhage) 05/10/2013    Past Medical History  Diagnosis Date  . Depression   . Diverticulitis   . Diverticulosis   . Anxiety   . Psoriasis   . Frequent headaches   . Cervical disc disease   . Carotid stenosis 10/08/2013  . Impaired glucose tolerance 10/08/2013    normal HgA1C  . Hypertension   . Arthritis   . Neck pain     Past Surgical History  Procedure Laterality Date  . Cholecystectomy    .  Cervical fusion      approx 1980  . Total abdominal hysterectomy  1998    Fibroid  . Posterior cervical fusion/foraminotomy N/A 11/05/2014    Procedure: Cervical four to Cervical seven  Posterior Cervical Fusion with lateral mass fixation;  Surgeon: Eustace Moore, MD;  Location: [facility];  Service: Neurosurgery;  Laterality: N/A;   C4 - C7 Posterior Cervical Fusion with lateral mass fixation    Current Outpatient Prescriptions  Medication Sig Dispense Refill  . amLODipine (NORVASC) 5 MG tablet Take 1 tablet (5 mg total) by mouth daily. 90 tablet 1  . aspirin EC 325 MG tablet Take 325 mg by mouth daily.    . calcium carbonate (OS-CAL) 1250 (500 CA) MG chewable tablet Chew 2 tablets by mouth daily.    Marland Kitchen conjugated estrogens (PREMARIN) vaginal cream Use 1/2 g vaginally every night at bed time for the first 2 weeks, then use 1/2 g vaginally two times per week. 30 g 2  . DULoxetine (CYMBALTA) 30 MG capsule Take 1 capsule (30 mg total) by mouth daily. 30 capsule 11  . escitalopram (LEXAPRO) 10 MG tablet Take 1 tablet (10 mg total) by mouth daily. 90 tablet 1  . polyethylene glycol (MIRALAX / GLYCOLAX) packet Take 17 g by mouth daily.    . Vitamin D, Ergocalciferol, (DRISDOL) 50000 UNITS CAPS capsule Take  1 capsule (50,000 Units total) by mouth every 7 (seven) days. (Patient taking differently: Take 50,000 Units by mouth every 7 (seven) days. wednesday) 12 capsule 0  . zolpidem (AMBIEN) 5 MG tablet Take 1 tablet (5 mg total) by mouth at bedtime as needed. for sleep 30 tablet 1   No current facility-administered medications for this visit.     ALLERGIES: Celebrex  Family History  Problem Relation Age of Onset  . Diabetes Brother   . Heart disease Mother   . Diabetes Mother   . Cervical cancer Mother   . Heart attack Mother   . Lung cancer Father   . Alcoholism Father   . Throat cancer Brother   . Cancer Other     lung cancer  . Heart disease Other   . Hypertension Other   .  Arthritis Other   . Alcohol abuse Other     Social History   Social History  . Marital Status: Married    Spouse Name: N/A  . Number of Children: 2  . Years of Education: 12   Occupational History  . retired    Social History Main Topics  . Smoking status: Never Smoker   . Smokeless tobacco: Never Used  . Alcohol Use: No  . Drug Use: No  . Sexual Activity:    Partners: Male    Birth Control/ Protection: Surgical     Comment: Hyst   Other Topics Concern  . Not on file   Social History Narrative   Lives at home with husband and son.   Right-handed.   2 cups caffeine daily.    ROS:  Pertinent items are noted in HPI.  PHYSICAL EXAMINATION:    BP 120/72 mmHg  Pulse 76  Ht 5\' 5"  (1.651 m)  Wt 146 lb 9.6 oz (66.497 kg)  BMI 24.40 kg/m2  LMP 05/22/1996    General appearance: alert, cooperative and appears stated age   Pelvic: External genitalia:  Bilateral labia minora with hypopigmentation.  Perianal region also hypopigmented.              Urethra:  normal appearing urethra with no masses, tenderness or lesions              Bartholins and Skenes: normal                 Vagina: normal appearing vagina with normal color and discharge, no lesions.  No abrasion or ecchymoses of hymen or vaginal tissue.               Cervix: absent              Bimanual Exam:  Uterus:  uterus absent              Adnexa: normal adnexa and no mass, fullness, tenderness                Chaperone was present for exam.  ASSESSMENT  Urinary frequency and urgency.   Abnormal urine.  UTI.  Vaginal atrophy much improved with Premarin cream.  PLAN  Counseled regarding UTI.  Will treat with Macrobid 100 mg po bid for 7 days.  UC sent.  Hydrate well.  Continue Premarin as prescribed.  Discussed cooking oils for lubrication for sexual activity.  To urgent care for worsening discomfort, fever, back pain, nausea or vomiting.  Follow up prn.    An After Visit Summary was printed and  given to the patient.  __15____ minutes face to face  time of which over 50% was spent in counseling.

## 2015-08-06 NOTE — Telephone Encounter (Signed)
Patient says she thinks her medication is making her go to the bathroom too much and would like to speak with nurse.

## 2015-08-09 ENCOUNTER — Telehealth: Payer: Self-pay | Admitting: Obstetrics and Gynecology

## 2015-08-09 LAB — URINE CULTURE

## 2015-08-09 NOTE — Telephone Encounter (Signed)
Patient is calling to see if her blood work results are in yet.

## 2015-08-09 NOTE — Telephone Encounter (Signed)
Left detailed message at number provided [phone], okay per ROI. Advised results from 08/06/2015 have not yet returned. Advised once these have returned we will be in contact with her to discuss the results. Advised if she has any further questions to contact the office at [phone].  Routing to provider for final review. Patient agreeable to disposition. Will close encounter.

## 2015-08-10 ENCOUNTER — Telehealth: Payer: Self-pay | Admitting: Emergency Medicine

## 2015-08-10 MED ORDER — AMPICILLIN 250 MG PO CAPS: 250.0000 mg | ORAL_CAPSULE | Freq: Four times a day (QID) | ORAL | Status: DC

## 2015-08-10 NOTE — Telephone Encounter (Signed)
Spoke with patient. She is given results and results discussed with patient. She reports feeling improved but not 100%. She agrees to start Ampicillin 250 mg po qid with food and to stop Macrobid.  She is advised to ensure she completes all medication and follow up if not improved or develops any concerning symptoms. She is agreeable and verbalizes understanding of instructions.  Routing to provider for final review. Patient agreeable to disposition. Will close encounter.

## 2015-08-10 NOTE — Telephone Encounter (Signed)
-----   Message from Nunzio Cobbs, MD sent at 08/09/2015  6:47 PM EDT ----- Please contact patient with results to see how she is doing.  UC showed group B strep. If she is still having UTI symptoms, I recommend she switch abx to Ampicillin 250 mg po qid for 7 days.   Cc- Marisa Sprinkles

## 2015-08-11 ENCOUNTER — Other Ambulatory Visit: Payer: Self-pay | Admitting: Internal Medicine

## 2015-08-11 ENCOUNTER — Other Ambulatory Visit: Payer: Self-pay | Admitting: *Deleted

## 2015-08-11 MED ORDER — ESTROGENS, CONJUGATED 0.625 MG/GM VA CREA: TOPICAL_CREAM | VAGINAL | Status: DC

## 2015-08-12 ENCOUNTER — Other Ambulatory Visit: Payer: Self-pay | Admitting: *Deleted

## 2015-08-12 MED ORDER — ESTROGENS, CONJUGATED 0.625 MG/GM VA CREA: TOPICAL_CREAM | VAGINAL | Status: DC

## 2015-08-13 ENCOUNTER — Ambulatory Visit: Payer: Medicare Other | Admitting: Obstetrics and Gynecology

## 2015-08-19 ENCOUNTER — Ambulatory Visit: Payer: Medicare Other | Admitting: Neurology

## 2015-08-20 ENCOUNTER — Telehealth: Payer: Self-pay | Admitting: Obstetrics and Gynecology

## 2015-08-20 NOTE — Telephone Encounter (Signed)
Call to patient. Advised yearly annual exam recommended for pelvic exam, rectal exam and yearly breast check. Patient is agreeable and is scheduled for 09/24/15 at 2:30 with Kem Boroughs, FNP.  Routing to provider for final review. Patient agreeable to disposition. Will close encounter.

## 2015-08-20 NOTE — Telephone Encounter (Signed)
Patient received a letter for her aex and just wanting to know if she still need to come in for that because she has seen Dr Quincy Simmonds for a pelvic exam already this year.

## 2015-08-26 ENCOUNTER — Telehealth: Payer: Self-pay | Admitting: Neurology

## 2015-08-26 ENCOUNTER — Encounter: Payer: Self-pay | Admitting: Neurology

## 2015-08-26 ENCOUNTER — Ambulatory Visit (INDEPENDENT_AMBULATORY_CARE_PROVIDER_SITE_OTHER): Payer: Medicare Other | Admitting: Neurology

## 2015-08-26 VITALS — BP 130/81 | HR 84 | Ht 65.0 in | Wt 146.0 lb

## 2015-08-26 DIAGNOSIS — G243 Spasmodic torticollis: Secondary | ICD-10-CM

## 2015-08-26 NOTE — Progress Notes (Signed)
**  Xeomin 100 units x 2 vials, Lot [id number], Exp 05/2017, NDC 0259-1610-01, office supply.//mck,rn** 

## 2015-08-26 NOTE — Progress Notes (Addendum)
PATIENT: Lauren [name] DOB: 13-Jul-1945  Chief Complaint  Patient presents with  . Cervical Dystonia    Xeomin 200 units - office supply     HISTORICAL  Lauren [name] is a 70 years old right-handed female, accompanied by her husband, seen in refer by pain management Dr. Clydell Hakim, primary care physician is Dr. Cathlean Cower for evaluation of left-sided neck pain, muscle spasm, failed to improve with trigger point injection  She had a history of hypertension, glucose intolerance, depression, cervical fusion surgery twice, initial was in 1980, she presented with left cervical radicular pain, surgery did help her symptoms, most recent one was June of 2016 by Dr. Ronnald Ramp,   This patient underwent a ACDF without plate many years ago. She fell, in August 30 2014 with a fracture through the fusion at C5-6. Initially she Was observed for few weeks, her neck pain continued to get progressively worse, X-rays showed no significant healing of the fracture. Eventually she had a posterior cervical fusion C3-C7 bilaterally utilizing morcellized allograf, [place] lateral mass fixation C3-7 inclusive utilizing Nuvasive lateral mass screws in June 2016  She recoved well from surgical site, but since her fall, she had constant left lateral neck muscle spasm, 7 out of 10 constant achy pain, over the past few months, she was under pain management Dr. Luan Pulling, has received trigger point injection, lidocaine patch, without helping her symptoms, she has to lie down every 1 or 2 hours to alleviate the pain, take frequent daily ibuprofen, she has tried different pain medications, trazodone, hydrocodone causes visual hallucinations, she also complains of worsening depression, tearful during today's interview, her constant moderate to severe left neck pain has caused interruption in her daily activity, she is now taking Lexapro 10 mg daily   I also personally reviewed CAT scan of cervical spine in April 2016 prior  to her surgery: Nondisplaced atypical fractures of the fused C5-C6 segment. The fractures are more visible in the posterior elements than across the vertebral body. C4 through C7 cervical fusion with probable C5-C6 corpectomy and strut graft.  CAT scan of the brain showed no significant abnormality  UPDATE April 6th 2017: This is her first EMG guided the xeomin injection, we used 200 units of Xeomin, potential side effect explained, consent form was signed,  REVIEW OF SYSTEMS: Full 14 system review of systems performed and notable only for depression, decreased energy, hallucinations, insomnia, confusion, headaches, slurred speech  ALLERGIES: Allergies  Allergen Reactions  . Celebrex [Celecoxib] Itching and Rash    HOME MEDICATIONS: Current Outpatient Prescriptions  Medication Sig Dispense Refill  . amLODipine (NORVASC) 5 MG tablet Take 1 tablet by mouth  daily 90 tablet 0  . ampicillin (PRINCIPEN) 250 MG capsule Take 1 capsule (250 mg total) by mouth 4 (four) times daily. 28 capsule 0  . aspirin EC 325 MG tablet Take 325 mg by mouth daily.    . calcium carbonate (OS-CAL) 1250 (500 CA) MG chewable tablet Chew 2 tablets by mouth daily.    Marland Kitchen conjugated estrogens (PREMARIN) vaginal cream Use 1/2 g vaginally every night at bed time for the first 2 weeks, then use 1/2 g vaginally two times per week. 30 g 2  . DULoxetine (CYMBALTA) 30 MG capsule Take 1 capsule (30 mg total) by mouth daily. 30 capsule 11  . escitalopram (LEXAPRO) 10 MG tablet Take 1 tablet (10 mg total) by mouth daily. 90 tablet 1  . nitrofurantoin, macrocrystal-monohydrate, (MACROBID) 100 MG capsule Take 1  capsule (100 mg total) by mouth 2 (two) times daily. 14 capsule 0  . polyethylene glycol (MIRALAX / GLYCOLAX) packet Take 17 g by mouth daily.    . Vitamin D, Ergocalciferol, (DRISDOL) 50000 UNITS CAPS capsule Take 1 capsule (50,000 Units total) by mouth every 7 (seven) days. (Patient taking differently: Take 50,000 Units by  mouth every 7 (seven) days. wednesday) 12 capsule 0  . zolpidem (AMBIEN) 5 MG tablet Take 1 tablet (5 mg total) by mouth at bedtime as needed. for sleep 30 tablet 1   No current facility-administered medications for this visit.    PAST MEDICAL HISTORY: Past Medical History  Diagnosis Date  . Depression   . Diverticulitis   . Diverticulosis   . Anxiety   . Psoriasis   . Frequent headaches   . Cervical disc disease   . Carotid stenosis 10/08/2013  . Impaired glucose tolerance 10/08/2013    normal HgA1C  . Hypertension   . Arthritis   . Neck pain     PAST SURGICAL HISTORY: Past Surgical History  Procedure Laterality Date  . Cholecystectomy    . Cervical fusion      approx 1980  . Total abdominal hysterectomy  1998    Fibroid  . Posterior cervical fusion/foraminotomy N/A 11/05/2014    Procedure: Cervical four to Cervical seven  Posterior Cervical Fusion with lateral mass fixation;  Surgeon: Eustace Moore, MD;  Location: [facility];  Service: Neurosurgery;  Laterality: N/A;   C4 - C7 Posterior Cervical Fusion with lateral mass fixation    FAMILY HISTORY: Family History  Problem Relation Age of Onset  . Diabetes Brother   . Heart disease Mother   . Diabetes Mother   . Cervical cancer Mother   . Heart attack Mother   . Lung cancer Father   . Alcoholism Father   . Throat cancer Brother   . Cancer Other     lung cancer  . Heart disease Other   . Hypertension Other   . Arthritis Other   . Alcohol abuse Other     SOCIAL HISTORY:  Social History   Social History  . Marital Status: Married    Spouse Name: N/A  . Number of Children: 2  . Years of Education: 12   Occupational History  . retired    Social History Main Topics  . Smoking status: Never Smoker   . Smokeless tobacco: Never Used  . Alcohol Use: No  . Drug Use: No  . Sexual Activity:    Partners: Male    Birth Control/ Protection: Surgical     Comment: Hyst   Other Topics Concern  . Not on file     Social History Narrative   Lives at home with husband and son.   Right-handed.   2 cups caffeine daily.     PHYSICAL EXAM   Filed Vitals:   08/26/15 1343  BP: 130/81  Pulse: 84  Height: 5\' 5"  (1.651 m)  Weight: 146 lb (66.225 kg)    Not recorded      Body mass index is 24.3 kg/(m^2).  PHYSICAL EXAMNIATION:  Gen: NAD, conversant, well nourised, obese, well groomed                     Cardiovascular: Regular rate rhythm, no peripheral edema, warm, nontender. Eyes: Conjunctivae clear without exudates or hemorrhage Neck: Supple, no carotid bruise. Pulmonary: Clear to auscultation bilaterally   NEUROLOGICAL EXAM:  MENTAL STATUS: Speech:  Speech is normal; fluent and spontaneous with normal comprehension.  Cognition:     Orientation to time, place and person     Normal recent and remote memory     Normal Attention span and concentration     Normal Language, naming, repeating,spontaneous speech     Fund of knowledge   CRANIAL NERVES: moderate anterocollis, mild right shoulder elevation, right tilt, left turn, tenderness of left cervical paraspinal, nuchal area upon deep palpitation, and atrophy of bilateral semispinalis, right splenius capitis and cervix, CN II: Visual fields are full to confrontation. Fundoscopic exam is normal with sharp discs and no vascular changes. Pupils are round equal and briskly reactive to light. CN III, IV, VI: extraocular movement are normal. No ptosis. CN V: Facial sensation is intact to pinprick in all 3 divisions bilaterally. Corneal responses are intact.  CN VII: Face is symmetric with normal eye closure and smile. CN VIII: Hearing is normal to rubbing fingers CN IX, X: Palate elevates symmetrically. Phonation is normal. CN XI: Head turning and shoulder shrug are intact CN XII: Tongue is midline with normal movements and no atrophy.  MOTOR: There is no pronator drift of out-stretched arms. Muscle bulk and tone are normal. Muscle  strength is normal.  REFLEXES: Reflexes are 2+ and symmetric at the biceps, triceps, knees, and ankles. Plantar responses are flexor.  SENSORY: Intact to light touch, pinprick, position sense, and vibration sense are intact in fingers and toes.  COORDINATION: Rapid alternating movements and fine finger movements are intact. There is no dysmetria on finger-to-nose and heel-knee-shin.    GAIT/STANCE: Posture is normal. Gait is steady with normal steps, base, arm swing, and turning. Heel and toe walking are normal. Tandem gait is normal.  Romberg is absent.   DIAGNOSTIC DATA (LABS, IMAGING, TESTING) - I reviewed patient records, labs, notes, testing and imaging myself where available.   ASSESSMENT AND PLAN  Lauren [name] is a 70 y.o. female   History of cervical  C3 to C7 allograft fusion Cervical dystonia, Left neck pain   She has persistent left cervical pain, moderate anterocollis, mild right shoulder elevation, right tilt, left turn, she has significant atrophy of bilateral semispinalis, left splenius capitis, splenius cervix muscles.   EMG guided xeomin injection, I  used 200 units today, I have asked for 400 units.   Left semispinalis 25 units   Right semispinalis 25 units  Left splenius capitis 25 unitsx2= 50 units  Left splenius cervix 25x2= 50 units  Left levator scapular 25 units  Longissimus capitis 25 units  She tolerate the injection well, will use 300 units at her next injection  Marcial Pacas, M.D. Ph.D.  [facility] [address], [place], [place] [address] Ph: [phone] Fax: [phone]  CC: Pain management Dr. Urbano Heir ureters s, primary care physician is Dr. Cathlean Cower

## 2015-08-26 NOTE — Telephone Encounter (Signed)
Patient called to speak with Andee Poles regarding [facility] appointment today 1:30pm.

## 2015-09-17 ENCOUNTER — Encounter (HOSPITAL_COMMUNITY): Payer: Self-pay | Admitting: Emergency Medicine

## 2015-09-17 ENCOUNTER — Emergency Department (HOSPITAL_COMMUNITY)
Admission: EM | Admit: 2015-09-17 | Discharge: 2015-09-17 | Disposition: A | Discharge status: Home / Self Care | Payer: Medicare Other | Attending: Emergency Medicine | Admitting: Emergency Medicine

## 2015-09-17 DIAGNOSIS — Z79899 Other long term (current) drug therapy: Secondary | ICD-10-CM | POA: Diagnosis not present

## 2015-09-17 DIAGNOSIS — Z7982 Long term (current) use of aspirin: Secondary | ICD-10-CM | POA: Insufficient documentation

## 2015-09-17 DIAGNOSIS — F419 Anxiety disorder, unspecified: Secondary | ICD-10-CM | POA: Insufficient documentation

## 2015-09-17 DIAGNOSIS — M1990 Unspecified osteoarthritis, unspecified site: Secondary | ICD-10-CM | POA: Insufficient documentation

## 2015-09-17 DIAGNOSIS — Z872 Personal history of diseases of the skin and subcutaneous tissue: Secondary | ICD-10-CM | POA: Insufficient documentation

## 2015-09-17 DIAGNOSIS — M7989 Other specified soft tissue disorders: Secondary | ICD-10-CM | POA: Diagnosis present

## 2015-09-17 DIAGNOSIS — I872 Venous insufficiency (chronic) (peripheral): Secondary | ICD-10-CM | POA: Diagnosis not present

## 2015-09-17 DIAGNOSIS — I10 Essential (primary) hypertension: Secondary | ICD-10-CM | POA: Diagnosis not present

## 2015-09-17 DIAGNOSIS — F329 Major depressive disorder, single episode, unspecified: Secondary | ICD-10-CM | POA: Diagnosis not present

## 2015-09-17 DIAGNOSIS — Z792 Long term (current) use of antibiotics: Secondary | ICD-10-CM | POA: Diagnosis not present

## 2015-09-17 DIAGNOSIS — Z8719 Personal history of other diseases of the digestive system: Secondary | ICD-10-CM | POA: Diagnosis not present

## 2015-09-17 LAB — CBC
HEMATOCRIT: 38.2 % (ref 36.0–46.0)
HEMOGLOBIN: 12 g/dL (ref 12.0–15.0)
MCH: 30.2 pg (ref 26.0–34.0)
MCHC: 31.4 g/dL (ref 30.0–36.0)
MCV: 96.2 fL (ref 78.0–100.0)
Platelets: 230 10*3/uL (ref 150–400)
RBC: 3.97 MIL/uL (ref 3.87–5.11)
RDW: 14.5 % (ref 11.5–15.5)
WBC: 6 10*3/uL (ref 4.0–10.5)

## 2015-09-17 LAB — BASIC METABOLIC PANEL
ANION GAP: 9 (ref 5–15)
BUN: 11 mg/dL (ref 6–20)
CHLORIDE: 106 mmol/L (ref 101–111)
CO2: 26 mmol/L (ref 22–32)
Calcium: 9.5 mg/dL (ref 8.9–10.3)
Creatinine, Ser: 0.72 mg/dL (ref 0.44–1.00)
GFR calc Af Amer: >60 mL/min (ref >60)
GFR calc non Af Amer: >60 mL/min (ref >60)
GLUCOSE: 107 mg/dL — AB (ref 65–99)
POTASSIUM: 3.7 mmol/L (ref 3.5–5.1)
SODIUM: 141 mmol/L (ref 135–145)

## 2015-09-17 LAB — BRAIN NATRIURETIC PEPTIDE: B Natriuretic Peptide: 42.9 pg/mL (ref 0.0–100.0)

## 2015-09-17 NOTE — ED Provider Notes (Signed)
CSN: [id number]     Arrival date & time 09/17/15  1359 History   First MD Initiated Contact with Patient 09/17/15 1550     Chief Complaint  Patient presents with  . Leg Swelling   HPI Pt is a 70 y.o. female with PMH of HTN, depression, arthritis, who presents with bilateral ankle swelling x3 weeks. Pt reports that she wore high heels for the first time in years and then had swelling and mild soreness of both ankles that has never resolved. She has been icing, elevating and soaking in epsom salts without relief. She reports that it does look a little better in the mornings after being elevated but never completely resolved. She has no history of heart or kidney problems. She denies any recent surgeries or history of malignancy. She did just get back from a trip to the beach but the swelling predates that travel. She denies any SOB, chest pain, palpitations.   Past Medical History  Diagnosis Date  . Depression   . Diverticulitis   . Diverticulosis   . Anxiety   . Psoriasis   . Frequent headaches   . Cervical disc disease   . Carotid stenosis 10/08/2013  . Impaired glucose tolerance 10/08/2013    normal HgA1C  . Hypertension   . Arthritis   . Neck pain    Past Surgical History  Procedure Laterality Date  . Cholecystectomy    . Cervical fusion      approx 1980  . Total abdominal hysterectomy  1998    Fibroid  . Posterior cervical fusion/foraminotomy N/A 11/05/2014    Procedure: Cervical four to Cervical seven  Posterior Cervical Fusion with lateral mass fixation;  Surgeon: Eustace Moore, MD;  Location: [facility];  Service: Neurosurgery;  Laterality: N/A;   C4 - C7 Posterior Cervical Fusion with lateral mass fixation   Family History  Problem Relation Age of Onset  . Diabetes Brother   . Heart disease Mother   . Diabetes Mother   . Cervical cancer Mother   . Heart attack Mother   . Lung cancer Father   . Alcoholism Father   . Throat cancer Brother   . Cancer Other     lung  cancer  . Heart disease Other   . Hypertension Other   . Arthritis Other   . Alcohol abuse Other    Social History  Substance Use Topics  . Smoking status: Never Smoker   . Smokeless tobacco: Never Used  . Alcohol Use: No   OB History    Gravida Para Term Preterm AB TAB SAB Ectopic Multiple Living   2 2 2       2      Review of Systems  All other systems reviewed and are negative. See HPI    Allergies  Celebrex  Home Medications   Prior to Admission medications   Medication Sig Start Date End Date Taking? Authorizing Provider  amLODipine (NORVASC) 5 MG tablet Take 1 tablet by mouth  daily 08/11/15   Biagio Borg, MD  ampicillin (PRINCIPEN) 250 MG capsule Take 1 capsule (250 mg total) by mouth 4 (four) times daily. 08/10/15   Brook Oletta Lamas, MD  aspirin EC 325 MG tablet Take 325 mg by mouth daily.    Historical Provider, MD  calcium carbonate (OS-CAL) 1250 (500 CA) MG chewable tablet Chew 2 tablets by mouth daily.    Historical Provider, MD  conjugated estrogens (PREMARIN) vaginal cream Use 1/2 g  vaginally every night at bed time for the first 2 weeks, then use 1/2 g vaginally two times per week. 08/12/15   Brook Oletta Lamas, MD  DULoxetine (CYMBALTA) 30 MG capsule Take 1 capsule (30 mg total) by mouth daily. 07/14/15   Marcial Pacas, MD  escitalopram (LEXAPRO) 10 MG tablet Take 1 tablet (10 mg total) by mouth daily. 05/27/15   Biagio Borg, MD  nitrofurantoin, macrocrystal-monohydrate, (MACROBID) 100 MG capsule Take 1 capsule (100 mg total) by mouth 2 (two) times daily. 08/06/15   Brook Oletta Lamas, MD  polyethylene glycol [facility] / Floria Raveling) packet Take 17 g by mouth daily.    Historical Provider, MD  Vitamin D, Ergocalciferol, (DRISDOL) 50000 UNITS CAPS capsule Take 1 capsule (50,000 Units total) by mouth every 7 (seven) days. Patient taking differently: Take 50,000 Units by mouth every 7 (seven) days. wednesday 08/13/14   Kem Boroughs, FNP  zolpidem (AMBIEN)  5 MG tablet Take 1 tablet (5 mg total) by mouth at bedtime as needed. for sleep 05/27/15   Biagio Borg, MD   BP 142/69 mmHg  Pulse 97  Temp(Src) 98 F (36.7 C) (Oral)  Resp 16  Ht 5\' 5"  (1.651 m)  Wt 68.493 kg  BMI 25.13 kg/m2  SpO2 96%  LMP 05/22/1996 Physical Exam  Constitutional: She is oriented to person, place, and time. She appears well-developed and well-nourished. No distress.  HENT:  Head: Normocephalic and atraumatic.  Eyes: Conjunctivae are normal. Pupils are equal, round, and reactive to light. Right eye exhibits no discharge. Left eye exhibits no discharge. No scleral icterus.  Neck: Normal range of motion. Neck supple. No JVD present.  Cardiovascular: Normal rate, regular rhythm, normal heart sounds and intact distal pulses.   No murmur heard. Pulmonary/Chest: Effort normal and breath sounds normal. No respiratory distress. She has no wheezes.  Abdominal: Soft. Bowel sounds are normal. She exhibits no distension. There is no tenderness.  Musculoskeletal: Normal range of motion. She exhibits edema.       Right ankle: She exhibits swelling. She exhibits normal range of motion, no ecchymosis, no deformity and no laceration. Tenderness. Achilles tendon normal.       Left ankle: She exhibits swelling. She exhibits normal range of motion, no ecchymosis, no deformity and no laceration. Tenderness. Achilles tendon normal.  2+ pitting edema of BLE to mid calf, mild diffuse tenderness, no erythema or focal pain/tenderness  Lymphadenopathy:    She has no cervical adenopathy.  Neurological: She is alert and oriented to person, place, and time. No cranial nerve deficit.  Skin: Skin is warm and dry. No rash noted. She is not diaphoretic. No erythema.  Psychiatric: She has a normal mood and affect. Her behavior is normal.  Nursing note and vitals reviewed.   ED Course  Procedures (including critical care time) Labs Review Labs Reviewed  BASIC METABOLIC PANEL - Abnormal; Notable  for the following:    Glucose, Bld 107 (*)    All other components within normal limits  CBC  BRAIN NATRIURETIC PEPTIDE    Imaging Review No results found. I have personally reviewed and evaluated these images and lab results as part of my medical decision-making.   EKG Interpretation None      MDM   Final diagnoses:  Venous insufficiency of both lower extremities   Bilateral ankle edema with normal creatinine and BNP, PERC negative, no dyspnea or CP. Likely venous insufficiency +/- OA. Discussed elevation, compression, ice at ankle joint.  Reassurance given based on normal labs. F/u with PCP in 2 weeks. Return for dyspnea, CP, palpitations, asymmetric edema.    Frazier Richards, MD 09/17/15 1641  Elnora Morrison, MD 09/18/15 [phone]

## 2015-09-17 NOTE — Discharge Instructions (Signed)
You can order compression stocking online at [email or web address]   Venous Stasis or Chronic Venous Insufficiency Chronic venous insufficiency, also called venous stasis, is a condition that affects the veins in the legs. The condition prevents blood from being pumped through these veins effectively. Blood may no longer be pumped effectively from the legs back to the heart. This condition can range from mild to severe. With proper treatment, you should be able to continue with an active life. CAUSES  Chronic venous insufficiency occurs when the vein walls become stretched, weakened, or damaged or when valves within the vein are damaged. Some common causes of this include:  High blood pressure inside the veins (venous hypertension).  Increased blood pressure in the leg veins from long periods of sitting or standing.  A blood clot that blocks blood flow in a vein (deep vein thrombosis).  Inflammation of a superficial vein (phlebitis) that causes a blood clot to form. RISK FACTORS Various things can make you more likely to develop chronic venous insufficiency, including:  Family history of this condition.  Obesity.  Pregnancy.  Sedentary lifestyle.  Smoking.  Jobs requiring long periods of standing or sitting in one place.  Being a certain age. Women in their 68s and 73s and men in their 70s are more likely to develop this condition. SIGNS AND SYMPTOMS  Symptoms may include:   Varicose veins.  Skin breakdown or ulcers.  Reddened or discolored skin on the leg.  Brown, smooth, tight, and painful skin just above the ankle, usually on the inside surface (lipodermatosclerosis).  Swelling. DIAGNOSIS  To diagnose this condition, your health care provider will take a medical history and do a physical exam. The following tests may be ordered to confirm the diagnosis:  Duplex ultrasound--A procedure that produces a picture of a blood vessel and nearby organs and also provides  information on blood flow through the blood vessel.  Plethysmography--A procedure that tests blood flow.  A venogram, or venography--A procedure used to look at the veins using X-ray and dye. TREATMENT The goals of treatment are to help you return to an active life and to minimize pain or disability. Treatment will depend on the severity of the condition. Medical procedures may be needed for severe cases. Treatment options may include:   Use of compression stockings. These can help with symptoms and lower the chances of the problem getting worse, but they do not cure the problem.  Sclerotherapy--A procedure involving an injection of a material that "dissolves" the damaged veins. Other veins in the network of blood vessels take over the function of the damaged veins.  Surgery to remove the vein or cut off blood flow through the vein (vein stripping or laser ablation surgery).  Surgery to repair a valve. HOME CARE INSTRUCTIONS   Wear compression stockings as directed by your health care provider.  Only take over-the-counter or prescription medicines for pain, discomfort, or fever as directed by your health care provider.  Follow up with your health care provider as directed. SEEK MEDICAL CARE IF:   You have redness, swelling, or increasing pain in the affected area.  You see a red streak or line that extends up or down from the affected area.  You have a breakdown or loss of skin in the affected area, even if the breakdown is small.  You have an injury to the affected area. SEEK IMMEDIATE MEDICAL CARE IF:   You have an injury and open wound in the affected area.  Your pain is  severe and does not improve with medicine.  You have sudden numbness or weakness in the foot or ankle below the affected area, or you have trouble moving your foot or ankle.  You have a fever or persistent symptoms for more than 2-3 days.  You have a fever and your symptoms suddenly get worse. MAKE SURE  YOU:   Understand these instructions.  Will watch your condition.  Will get help right away if you are not doing well or get worse.   This information is not intended to replace advice given to you by your health care provider. Make sure you discuss any questions you have with your health care provider.   Document Released: 09/11/2006 Document Revised: 02/26/2013 Document Reviewed: 01/13/2013 Elsevier Interactive Patient Education [employer].

## 2015-09-17 NOTE — ED Notes (Signed)
Pt states for the last 3 weeks she has had bilaterally feet swelling. +2 pitting edema to bilateral lower ankles and feet. Pt denies any sob or pain. Redness to toes. Pedal pulses +2 equal bilaterally.

## 2015-09-22 ENCOUNTER — Encounter: Payer: Self-pay | Admitting: Internal Medicine

## 2015-09-23 ENCOUNTER — Telehealth: Payer: Self-pay | Admitting: Nurse Practitioner

## 2015-09-23 NOTE — Telephone Encounter (Signed)
Left patient a message to call back to reschedule a future appointment that was cancelled by the provider. °

## 2015-09-24 ENCOUNTER — Ambulatory Visit: Payer: Medicare Other | Admitting: Nurse Practitioner

## 2015-10-05 ENCOUNTER — Ambulatory Visit: Payer: Medicare Other | Admitting: Nurse Practitioner

## 2015-10-05 ENCOUNTER — Telehealth: Payer: Self-pay | Admitting: Nurse Practitioner

## 2015-10-05 NOTE — Telephone Encounter (Signed)
OK and I closed the encounter.

## 2015-10-05 NOTE — Telephone Encounter (Signed)
Patient canceled her appointment today due to illness. Patient will call later to reschedule.

## 2015-10-15 ENCOUNTER — Encounter: Payer: Medicare Other | Admitting: Internal Medicine

## 2015-10-19 ENCOUNTER — Encounter: Payer: Medicare Other | Admitting: Internal Medicine

## 2015-10-20 ENCOUNTER — Ambulatory Visit (INDEPENDENT_AMBULATORY_CARE_PROVIDER_SITE_OTHER): Payer: Medicare Other | Admitting: Nurse Practitioner

## 2015-10-20 ENCOUNTER — Encounter: Payer: Self-pay | Admitting: Nurse Practitioner

## 2015-10-20 VITALS — BP 120/62 | HR 84 | Resp 16 | Ht 64.0 in | Wt 150.0 lb

## 2015-10-20 DIAGNOSIS — Z20828 Contact with and (suspected) exposure to other viral communicable diseases: Secondary | ICD-10-CM | POA: Diagnosis not present

## 2015-10-20 DIAGNOSIS — Z01419 Encounter for gynecological examination (general) (routine) without abnormal findings: Secondary | ICD-10-CM | POA: Diagnosis not present

## 2015-10-20 DIAGNOSIS — Z1211 Encounter for screening for malignant neoplasm of colon: Secondary | ICD-10-CM | POA: Diagnosis not present

## 2015-10-20 DIAGNOSIS — Z205 Contact with and (suspected) exposure to viral hepatitis: Secondary | ICD-10-CM

## 2015-10-20 DIAGNOSIS — E559 Vitamin D deficiency, unspecified: Secondary | ICD-10-CM

## 2015-10-20 NOTE — Progress Notes (Signed)
70 y.o. G53P2002 Married  Caucasian Fe here for annual exam.  Cervical  injury last year with ORIF.  Continued pain and had to take Botox X 6 injections and after 8 days no severe pain.  Swelling of feet and ankles bilateral.     Patient's last menstrual period was 05/22/1996.          Sexually active: Yes.    The current method of family planning is status post hysterectomy.    Exercising: Yes.    walking - working in yard Smoker:  no  Health Maintenance: Pap:  03/17/09, negative  MMG:  04/08/15 Incomplete. Need additional imaging evaluation and/or prior mammograms for comparison. 04/21/15 BIRADS1 negative Colonoscopy:  08/2009, repeat in 10 years BMD:   08/27/14 T Score: Spine -1.7; hip -2.2 TDaP:  05/31/12 Shingles: 10/15/2014 Pneumococcal Conjugate-13 10/08/2013 Pneumococcal Polysaccharide-23 10/14/2014 Labs: PCP   reports that she has never smoked. She has never used smokeless tobacco. She reports that she does not drink alcohol or use illicit drugs.  Past Medical History  Diagnosis Date  . Depression   . Diverticulitis   . Diverticulosis   . Anxiety   . Psoriasis   . Frequent headaches   . Cervical disc disease   . Carotid stenosis 10/08/2013  . Impaired glucose tolerance 10/08/2013    normal HgA1C  . Hypertension   . Arthritis   . Neck pain     Past Surgical History  Procedure Laterality Date  . Cholecystectomy    . Cervical fusion      approx 1980  . Total abdominal hysterectomy  1998    Fibroid  . Posterior cervical fusion/foraminotomy N/A 11/05/2014    Procedure: Cervical four to Cervical seven  Posterior Cervical Fusion with lateral mass fixation;  Surgeon: Eustace Moore, MD;  Location: [facility];  Service: Neurosurgery;  Laterality: N/A;   C4 - C7 Posterior Cervical Fusion with lateral mass fixation    Current Outpatient Prescriptions  Medication Sig Dispense Refill  . amLODipine (NORVASC) 5 MG tablet Take 1 tablet by mouth  daily 90 tablet 0  . aspirin EC 325  MG tablet Take 325 mg by mouth daily.    . calcium carbonate (OS-CAL) 1250 (500 CA) MG chewable tablet Chew 2 tablets by mouth daily.    Marland Kitchen conjugated estrogens (PREMARIN) vaginal cream Use 1/2 g vaginally every night at bed time for the first 2 weeks, then use 1/2 g vaginally two times per week. 30 g 2  . DULoxetine (CYMBALTA) 30 MG capsule Take 1 capsule (30 mg total) by mouth daily. 30 capsule 11  . escitalopram (LEXAPRO) 10 MG tablet Take 1 tablet (10 mg total) by mouth daily. 90 tablet 1  . OnabotulinumtoxinA (BOTOX IM) Inject into the muscle.    . polyethylene glycol (MIRALAX / GLYCOLAX) packet Take 17 g by mouth daily.    . Vitamin D, Ergocalciferol, (DRISDOL) 50000 UNITS CAPS capsule Take 1 capsule (50,000 Units total) by mouth every 7 (seven) days. (Patient taking differently: Take 50,000 Units by mouth every 7 (seven) days. wednesday) 12 capsule 0  . zolpidem (AMBIEN) 5 MG tablet Take 1 tablet (5 mg total) by mouth at bedtime as needed. for sleep 30 tablet 1   No current facility-administered medications for this visit.    Family History  Problem Relation Age of Onset  . Diabetes Brother   . Heart disease Mother   . Diabetes Mother   . Cervical cancer Mother   . Heart  attack Mother   . Lung cancer Father   . Alcoholism Father   . Throat cancer Brother   . Cancer Other     lung cancer  . Heart disease Other   . Hypertension Other   . Arthritis Other   . Alcohol abuse Other     ROS:  Pertinent items are noted in HPI.  Otherwise, a comprehensive ROS was negative.  Exam:   BP 120/62 mmHg  Pulse 84  Resp 16  Ht 5\' 4"  (1.626 m)  Wt 150 lb (68.04 kg)  BMI 25.73 kg/m2  LMP 05/22/1996 Height: 5\' 4"  (162.6 cm) Ht Readings from Last 3 Encounters:  10/20/15 5\' 4"  (1.626 m)  09/17/15 5\' 5"  (1.651 m)  08/26/15 5\' 5"  (1.651 m)    General appearance: alert, cooperative and appears stated age Head: Normocephalic, without obvious abnormality, atraumatic Neck: no adenopathy,  supple, symmetrical, trachea midline and thyroid normal to inspection and palpation Lungs: clear to auscultation bilaterally Breasts: normal appearance, no masses or tenderness Heart: regular rate and rhythm Abdomen: soft, non-tender; no masses,  no organomegaly Extremities: extremities normal, atraumatic, no cyanosis or edema Skin: Skin color, texture, turgor normal. No rashes or lesions Lymph nodes: Cervical, supraclavicular, and axillary nodes normal. No abnormal inguinal nodes palpated Neurologic: Grossly normal   Pelvic: External genitalia:  no lesions              Urethra:  normal appearing urethra with no masses, tenderness or lesions              Bartholin's and Skene's: normal                 Vagina: normal appearing vagina with normal color and discharge, no lesions              Cervix: absent              Pap taken: No. Bimanual Exam:  Uterus:  uterus absent              Adnexa: no mass, fullness, tenderness               Rectovaginal: Confirms               Anus:  normal sphincter tone, no lesions  Chaperone present: no  A:  Well Woman with normal exam  Status post TAH/BSO 1998 secondary to fibroids Took ERT 1998 - 04/2006 History of Anxiety. Insomnia.  Vulvar lesions with diagnosis of lichen sclerosis.  Atrophic vaginal changes. History of Vit D deficiency    P:   Reviewed health and wellness pertinent to exam  Pap smear as above  Mammogram is due 08/2015  Follow with Vit D  IFOB is given  Counseled on breast self exam, mammography screening, adequate intake of calcium and vitamin D, diet and exercise return annually or prn  An After Visit Summary was printed and given to the patient.

## 2015-10-20 NOTE — Progress Notes (Signed)
Reviewed personally.  M. Suzanne Lemond Griffee, MD.  

## 2015-10-20 NOTE — Patient Instructions (Addendum)

## 2015-10-21 LAB — VITAMIN D 25 HYDROXY (VIT D DEFICIENCY, FRACTURES): Vit D, 25-Hydroxy: 65 ng/mL (ref 30–100)

## 2015-10-21 LAB — HEPATITIS C ANTIBODY: HCV AB: NEGATIVE

## 2015-10-22 ENCOUNTER — Encounter: Payer: Self-pay | Admitting: Internal Medicine

## 2015-10-22 ENCOUNTER — Ambulatory Visit (INDEPENDENT_AMBULATORY_CARE_PROVIDER_SITE_OTHER): Payer: Medicare Other | Admitting: Internal Medicine

## 2015-10-22 VITALS — BP 128/82 | HR 80 | Temp 98.7°F | Resp 20 | Wt 150.0 lb

## 2015-10-22 DIAGNOSIS — R7302 Impaired glucose tolerance (oral): Secondary | ICD-10-CM | POA: Diagnosis not present

## 2015-10-22 DIAGNOSIS — Z0001 Encounter for general adult medical examination with abnormal findings: Secondary | ICD-10-CM

## 2015-10-22 DIAGNOSIS — F329 Major depressive disorder, single episode, unspecified: Secondary | ICD-10-CM | POA: Diagnosis not present

## 2015-10-22 DIAGNOSIS — R600 Localized edema: Secondary | ICD-10-CM | POA: Insufficient documentation

## 2015-10-22 DIAGNOSIS — I5030 Unspecified diastolic (congestive) heart failure: Secondary | ICD-10-CM

## 2015-10-22 DIAGNOSIS — I10 Essential (primary) hypertension: Secondary | ICD-10-CM | POA: Diagnosis not present

## 2015-10-22 DIAGNOSIS — F32A Depression, unspecified: Secondary | ICD-10-CM

## 2015-10-22 DIAGNOSIS — R6889 Other general symptoms and signs: Secondary | ICD-10-CM | POA: Diagnosis not present

## 2015-10-22 DIAGNOSIS — R609 Edema, unspecified: Secondary | ICD-10-CM

## 2015-10-22 MED ORDER — ZOLPIDEM TARTRATE 5 MG PO TABS: 5.0000 mg | ORAL_TABLET | Freq: Every evening | ORAL | Status: DC | PRN

## 2015-10-22 MED ORDER — HYDROCHLOROTHIAZIDE 12.5 MG PO CAPS: 12.5000 mg | ORAL_CAPSULE | Freq: Every day | ORAL | Status: DC

## 2015-10-22 NOTE — Patient Instructions (Signed)
Please take all new medication as prescribed - the fluid pill  Please continue all other medications as before, and refills have been done if requested.  Please have the pharmacy call with any other refills you may need.  Please continue your efforts at being more active, low cholesterol diet, and weight control.  You are otherwise up to date with prevention measures today.  Please keep your appointments with your specialists as you may have planned  You will be contacted regarding the referral for: Echocardiogram  Please go to the LAB in the Basement (turn left off the elevator) for the tests to be done today  You will be contacted by phone if any changes need to be made immediately.  Otherwise, you will receive a letter about your results with an explanation, but please check with MyChart first.  Please remember to sign up for MyChart if you have not done so, as this will be important to you in the future with finding out test results, communicating by private email, and scheduling acute appointments online when needed.  Please return in 1 year for your yearly visit, or sooner if needed, with Lab testing done 3-5 days before

## 2015-10-22 NOTE — Progress Notes (Signed)
Pre visit review using our clinic review tool, if applicable. No additional management support is needed unless otherwise documented below in the visit note. 

## 2015-10-22 NOTE — Assessment & Plan Note (Signed)
stable overall by history and exam, recent data reviewed with pt, and pt to continue medical treatment as before,  to f/u any worsening symptoms or concerns Lab Results  Component Value Date   HGBA1C 5.5 10/14/2014

## 2015-10-22 NOTE — Assessment & Plan Note (Signed)

## 2015-10-22 NOTE — Assessment & Plan Note (Addendum)
C/w possible venous insufficiency, to cont leg elevation, low salt, wt control, and compression stockings use daily, but also for Echo to r/o diast dysfunction  In addition to the time spent performing CPE, I spent an additional 25 minutes face to face,in which greater than 50% of this time was spent in counseling and coordination of care for patient's acute illness as documented.

## 2015-10-22 NOTE — Assessment & Plan Note (Signed)
stable overall by history and exam, recent data reviewed with pt, and pt to continue medical treatment as before,  to f/u any worsening symptoms or concerns Lab Results  Component Value Date   WBC 6.0 09/17/2015   HGB 12.0 09/17/2015   HCT 38.2 09/17/2015   PLT 230 09/17/2015   GLUCOSE 107* 09/17/2015   CHOL 162 10/14/2014   TRIG 133.0 10/14/2014   HDL 34.10* 10/14/2014   LDLDIRECT 58.5 03/09/2014   LDLCALC 101* 10/14/2014   ALT 47* 11/16/2014   AST 35 11/16/2014   NA 141 09/17/2015   K 3.7 09/17/2015   CL 106 09/17/2015   CREATININE 0.72 09/17/2015   BUN 11 09/17/2015   CO2 26 09/17/2015   TSH 13.23* 10/14/2014   INR 0.97 10/27/2014   HGBA1C 5.5 10/14/2014

## 2015-10-22 NOTE — Assessment & Plan Note (Signed)
stable overall by history and exam, recent data reviewed with pt, and pt to continue medical treatment as before,  to f/u any worsening symptoms or concerns BP Readings from Last 3 Encounters:  10/22/15 128/82  10/20/15 120/62  09/17/15 142/69

## 2015-10-22 NOTE — Progress Notes (Signed)
Subjective:    Patient ID: Lauren [name], female    DOB: 01-15-1946, 70 y.o.   MRN: [id number]  HPI  Here for wellness and f/u;  Overall doing ok;  Pt denies Chest pain, worsening SOB, DOE, wheezing, orthopnea, PND, palpitations, dizziness or syncope.  Pt denies neurological change such as new headache, facial or extremity weakness.  Pt denies polydipsia, polyuria, or low sugar symptoms. Pt states overall good compliance with treatment and medications, good tolerability, and has been trying to follow appropriate diet.  Pt denies worsening depressive symptoms, suicidal ideation or panic. No fever, night sweats, wt loss, loss of appetite, or other constitutional symptoms.  Pt states good ability with ADL's, has low fall risk, home safety reviewed and adequate, no other significant changes in hearing or vision, and only occasionally active with exercise. Had cologuard checked per GYN, just done yesterday, results pending. Hep c neg and vit d normal per labs per GYN yesterday.  Getting cymalta and lexapro and botox per Dr Stana Bunting, with f/u July 6. Last botox apr 13, and since then with sudden worsening LE edema, persists, seen at ED , felt to have venous insuficiency, pt has been drinking more lfuids for some reason b/c she thought it might help, but o/w working on elevation, and compression stockings. Asks for mild fluid pill.  ECG normal April 2016. Denies worsening depressive symptoms, suicidal ideation, or panic Past Medical History  Diagnosis Date  . Depression   . Diverticulitis   . Diverticulosis   . Anxiety   . Psoriasis   . Frequent headaches   . Cervical disc disease   . Carotid stenosis 10/08/2013  . Impaired glucose tolerance 10/08/2013    normal HgA1C  . Hypertension   . Arthritis   . Neck pain    Past Surgical History  Procedure Laterality Date  . Cholecystectomy    . Cervical fusion      approx 1980  . Total abdominal hysterectomy  1998    Fibroid  . Posterior cervical  fusion/foraminotomy N/A 11/05/2014    Procedure: Cervical four to Cervical seven  Posterior Cervical Fusion with lateral mass fixation;  Surgeon: Eustace Moore, MD;  Location: [facility];  Service: Neurosurgery;  Laterality: N/A;   C4 - C7 Posterior Cervical Fusion with lateral mass fixation    reports that she has never smoked. She has never used smokeless tobacco. She reports that she does not drink alcohol or use illicit drugs. family history includes Alcohol abuse in her other; Alcoholism in her father; Arthritis in her other; Cancer in her other; Cervical cancer in her mother; Diabetes in her brother and mother; Heart attack in her mother; Heart disease in her mother and other; Hypertension in her other; Lung cancer in her father; Throat cancer in her brother. Allergies  Allergen Reactions  . Celebrex [Celecoxib] Itching and Rash   Current Outpatient Prescriptions on File Prior to Visit  Medication Sig Dispense Refill  . amLODipine (NORVASC) 5 MG tablet Take 1 tablet by mouth  daily 90 tablet 0  . aspirin EC 325 MG tablet Take 325 mg by mouth daily.    . calcium carbonate (OS-CAL) 1250 (500 CA) MG chewable tablet Chew 2 tablets by mouth daily.    . DULoxetine (CYMBALTA) 30 MG capsule Take 1 capsule (30 mg total) by mouth daily. 30 capsule 11  . escitalopram (LEXAPRO) 10 MG tablet Take 1 tablet (10 mg total) by mouth daily. 90 tablet 1  . OnabotulinumtoxinA (  BOTOX IM) Inject into the muscle.    . polyethylene glycol (MIRALAX / GLYCOLAX) packet Take 17 g by mouth daily.    . Vitamin D, Ergocalciferol, (DRISDOL) 50000 UNITS CAPS capsule Take 1 capsule (50,000 Units total) by mouth every 7 (seven) days. (Patient taking differently: Take 50,000 Units by mouth every 7 (seven) days. wednesday) 12 capsule 0   No current facility-administered medications on file prior to visit.   Review of Systems Constitutional: Negative for increased diaphoresis, or other activity, appetite or siginficant  weight change other than noted HENT: Negative for worsening hearing loss, ear pain, facial swelling, mouth sores and neck stiffness.   Eyes: Negative for other worsening pain, redness or visual disturbance.  Respiratory: Negative for choking or stridor Cardiovascular: Negative for other chest pain and palpitations.  Gastrointestinal: Negative for worsening diarrhea, blood in stool, or abdominal distention Genitourinary: Negative for hematuria, flank pain or change in urine volume.  Musculoskeletal: Negative for myalgias or other joint complaints.  Skin: Negative for other color change and wound or drainage.  Neurological: Negative for syncope and numbness. other than noted Hematological: Negative for adenopathy. or other swelling Psychiatric/Behavioral: Negative for hallucinations, SI, self-injury, decreased concentration or other worsening agitation.      Objective:   Physical Exam BP 128/82 mmHg  Pulse 80  Temp(Src) 98.7 F (37.1 C) (Oral)  Resp 20  Wt 150 lb (68.04 kg)  SpO2 96%  LMP 05/22/1996 VS noted,  Constitutional: Pt is oriented to person, place, and time. Appears well-developed and well-nourished, in no significant distress Head: Normocephalic and atraumatic  Eyes: Conjunctivae and EOM are normal. Pupils are equal, round, and reactive to light Right Ear: External ear normal.  Left Ear: External ear normal Nose: Nose normal.  Mouth/Throat: Oropharynx is clear and moist  Neck: Normal range of motion. Neck supple. No JVD present. No tracheal deviation present or significant neck LA or mass Cardiovascular: Normal rate, regular rhythm, normal heart sounds and intact distal pulses.   Pulmonary/Chest: Effort normal and breath sounds without rales or wheezing  Abdominal: Soft. Bowel sounds are normal. NT. No HSM  Musculoskeletal: Normal range of motion. Exhibits trace to 1+ bilat LE edema to knees Lymphadenopathy: Has no cervical adenopathy.  Neurological: Pt is alert and  oriented to person, place, and time. Pt has normal reflexes. No cranial nerve deficit. Motor grossly intact Skin: Skin is warm and dry. No rash noted or new ulcers Psychiatric:  Has normal mood and affect. Behavior is normal. Not depressed affect    Assessment & Plan:

## 2015-10-25 ENCOUNTER — Other Ambulatory Visit (INDEPENDENT_AMBULATORY_CARE_PROVIDER_SITE_OTHER): Payer: Medicare Other

## 2015-10-25 DIAGNOSIS — Z0001 Encounter for general adult medical examination with abnormal findings: Secondary | ICD-10-CM | POA: Diagnosis not present

## 2015-10-25 DIAGNOSIS — R7302 Impaired glucose tolerance (oral): Secondary | ICD-10-CM

## 2015-10-25 DIAGNOSIS — R6889 Other general symptoms and signs: Secondary | ICD-10-CM | POA: Diagnosis not present

## 2015-10-25 LAB — CBC WITH DIFFERENTIAL/PLATELET
BASOS PCT: 0.5 % (ref 0.0–3.0)
Basophils Absolute: 0 10*3/uL (ref 0.0–0.1)
EOS ABS: 0.1 10*3/uL (ref 0.0–0.7)
Eosinophils Relative: 1.8 % (ref 0.0–5.0)
HCT: 37.1 % (ref 36.0–46.0)
HEMOGLOBIN: 12.4 g/dL (ref 12.0–15.0)
LYMPHS ABS: 1.2 10*3/uL (ref 0.7–4.0)
Lymphocytes Relative: 21 % (ref 12.0–46.0)
MCHC: 33.6 g/dL (ref 30.0–36.0)
MCV: 91.1 fl (ref 78.0–100.0)
MONO ABS: 0.5 10*3/uL (ref 0.1–1.0)
Monocytes Relative: 8.9 % (ref 3.0–12.0)
NEUTROS PCT: 67.8 % (ref 43.0–77.0)
Neutro Abs: 4 10*3/uL (ref 1.4–7.7)
Platelets: 267 10*3/uL (ref 150.0–400.0)
RBC: 4.07 Mil/uL (ref 3.87–5.11)
RDW: 14.5 % (ref 11.5–15.5)
WBC: 5.9 10*3/uL (ref 4.0–10.5)

## 2015-10-25 LAB — URINALYSIS, ROUTINE W REFLEX MICROSCOPIC
Bilirubin Urine: NEGATIVE
Hgb urine dipstick: NEGATIVE
KETONES UR: NEGATIVE
LEUKOCYTES UA: NEGATIVE
NITRITE: NEGATIVE
PH: 6 (ref 5.0–8.0)
RBC / HPF: NONE SEEN (ref 0–?)
SPECIFIC GRAVITY, URINE: 1.02 (ref 1.000–1.030)
Total Protein, Urine: NEGATIVE
URINE GLUCOSE: NEGATIVE
UROBILINOGEN UA: 0.2 (ref 0.0–1.0)

## 2015-10-25 LAB — HEPATIC FUNCTION PANEL
ALBUMIN: 4.2 g/dL (ref 3.5–5.2)
ALT: 15 U/L (ref 0–35)
AST: 20 U/L (ref 0–37)
Alkaline Phosphatase: 90 U/L (ref 39–117)
BILIRUBIN TOTAL: 0.5 mg/dL (ref 0.2–1.2)
Bilirubin, Direct: 0.1 mg/dL (ref 0.0–0.3)
Total Protein: 7.3 g/dL (ref 6.0–8.3)

## 2015-10-25 LAB — BASIC METABOLIC PANEL
BUN: 13 mg/dL (ref 6–23)
CHLORIDE: 104 meq/L (ref 96–112)
CO2: 30 mEq/L (ref 19–32)
Calcium: 9.7 mg/dL (ref 8.4–10.5)
Creatinine, Ser: 0.69 mg/dL (ref 0.40–1.20)
GFR: 89.23 mL/min (ref >60.00)
GLUCOSE: 80 mg/dL (ref 70–99)
POTASSIUM: 3.8 meq/L (ref 3.5–5.1)
Sodium: 140 mEq/L (ref 135–145)

## 2015-10-25 LAB — LIPID PANEL
CHOLESTEROL: 152 mg/dL (ref 0–200)
HDL: 37.6 mg/dL — ABNORMAL LOW (ref >39.00)
LDL Cholesterol: 88 mg/dL (ref 0–99)
NonHDL: 114.26
TRIGLYCERIDES: 132 mg/dL (ref 0.0–149.0)
Total CHOL/HDL Ratio: 4
VLDL: 26.4 mg/dL (ref 0.0–40.0)

## 2015-10-25 LAB — TSH: TSH: 5.11 u[IU]/mL — AB (ref 0.35–4.50)

## 2015-10-25 LAB — HEMOGLOBIN A1C: Hgb A1c MFr Bld: 5.7 % (ref 4.6–6.5)

## 2015-10-26 ENCOUNTER — Encounter: Payer: Self-pay | Admitting: Internal Medicine

## 2015-10-26 ENCOUNTER — Other Ambulatory Visit: Payer: Self-pay | Admitting: Internal Medicine

## 2015-10-26 DIAGNOSIS — E039 Hypothyroidism, unspecified: Secondary | ICD-10-CM

## 2015-10-26 HISTORY — DX: Hypothyroidism, unspecified: E03.9

## 2015-10-26 MED ORDER — LEVOTHYROXINE SODIUM 25 MCG PO TABS: 25.0000 ug | ORAL_TABLET | Freq: Every day | ORAL | Status: DC

## 2015-10-26 NOTE — Addendum Note (Signed)
Addended by: Kem Boroughs R on: 10/26/2015 12:44 PM   Modules accepted: Orders

## 2015-11-02 LAB — FECAL OCCULT BLOOD, IMMUNOCHEMICAL: IFOBT: NEGATIVE

## 2015-11-02 NOTE — Progress Notes (Signed)
Results have been entered into chart by Arnetha Massy, [place]

## 2015-11-02 NOTE — Addendum Note (Signed)
Addended by: Remigio Eisenmenger on: 11/02/2015 08:38 AM   Modules accepted: Miquel Dunn

## 2015-11-02 NOTE — Addendum Note (Signed)
Addended by: Remigio Eisenmenger on: 11/02/2015 08:40 AM   Modules accepted: Orders

## 2015-11-08 ENCOUNTER — Telehealth: Payer: Self-pay

## 2015-11-08 DIAGNOSIS — E039 Hypothyroidism, unspecified: Secondary | ICD-10-CM

## 2015-11-08 NOTE — Telephone Encounter (Signed)
OK for TSH and free t4 at 4 wks, I have placed order , thanks

## 2015-11-08 NOTE — Telephone Encounter (Signed)
Patient came into office today to pick up lab results and have explanation about what her lab results meant---she states she did not get message asking her to call us back---she is currently having problems with her password on mychart ---i have educated patient about tsh level and medicine that was sent in---patient is starting synthroid today--routing to dr Imagene Sheller do you want patient to come back for follow up to have tsh rechecked?---please advise, i will call patient back---patient needs to be contacted directly right now, do not leave message, we need to make sure we actually talk with patient to be certain she understands about follow up---patient will also be trying to reset password on mychart

## 2015-11-08 NOTE — Addendum Note (Signed)
Addended by: Biagio Borg on: 11/08/2015 02:39 PM   Modules accepted: Orders

## 2015-11-10 NOTE — Telephone Encounter (Signed)
Patient called back, i verified that patient has picked rx up from pharm and started thyroid medication, patient advised of dr Judi Cong note, she will come for labs x4 weeks after start of medication, orders already entered per dr Jenny Reichmann

## 2015-11-10 NOTE — Telephone Encounter (Signed)
Left message asking patient to call back----needs to talk with Lauren [name] about tsh labs

## 2015-11-11 ENCOUNTER — Ambulatory Visit (HOSPITAL_COMMUNITY): Payer: Medicare Other | Attending: Cardiology

## 2015-11-11 ENCOUNTER — Other Ambulatory Visit: Payer: Self-pay

## 2015-11-11 DIAGNOSIS — I340 Nonrheumatic mitral (valve) insufficiency: Secondary | ICD-10-CM | POA: Insufficient documentation

## 2015-11-11 DIAGNOSIS — I110 Hypertensive heart disease with heart failure: Secondary | ICD-10-CM | POA: Diagnosis not present

## 2015-11-11 DIAGNOSIS — I5031 Acute diastolic (congestive) heart failure: Secondary | ICD-10-CM | POA: Diagnosis present

## 2015-11-11 DIAGNOSIS — I5030 Unspecified diastolic (congestive) heart failure: Secondary | ICD-10-CM | POA: Diagnosis not present

## 2015-11-11 LAB — ECHOCARDIOGRAM COMPLETE
AVLVOTPG: 4 mmHg
Ao-asc: 33 cm
CHL CUP DOP CALC LVOT VTI: 19.3 cm
EERAT: 9.59
EWDT: 285 ms
FS: 35 % (ref 28–44)
IV/PV OW: 1.11
LA diam end sys: 36 mm
LA vol A4C: 34 ml
LADIAMINDEX: 2.04 cm/m2
LASIZE: 36 mm
LAVOL: 28 mL
LAVOLIN: 15.8 mL/m2
LV PW d: 13.2 mm — AB (ref 0.6–1.1)
LV TDI E'LATERAL: 5.15
LVEEAVG: 9.59
LVEEMED: 9.59
LVELAT: 5.15 cm/s
LVOT SV: 61 mL
LVOT area: 3.14 cm2
LVOT diameter: 20 mm
LVOTPV: 104 cm/s
MV Dec: 285
MV pk A vel: 87.9 m/s
MVPKEVEL: 49.4 m/s
Reg peak vel: 238 cm/s
TDI e' medial: 4.72
TRMAXVEL: 238 cm/s

## 2015-11-17 DIAGNOSIS — H524 Presbyopia: Secondary | ICD-10-CM | POA: Diagnosis not present

## 2015-11-25 ENCOUNTER — Ambulatory Visit: Payer: Medicare Other | Admitting: Neurology

## 2015-12-02 ENCOUNTER — Other Ambulatory Visit: Payer: Self-pay | Admitting: Internal Medicine

## 2015-12-14 ENCOUNTER — Other Ambulatory Visit (INDEPENDENT_AMBULATORY_CARE_PROVIDER_SITE_OTHER): Payer: Medicare Other

## 2015-12-14 DIAGNOSIS — E039 Hypothyroidism, unspecified: Secondary | ICD-10-CM | POA: Diagnosis not present

## 2015-12-14 LAB — TSH: TSH: 2.5 u[IU]/mL (ref 0.35–4.50)

## 2015-12-14 LAB — T4, FREE: Free T4: 0.87 ng/dL (ref 0.60–1.60)

## 2015-12-25 DIAGNOSIS — Z23 Encounter for immunization: Secondary | ICD-10-CM | POA: Diagnosis not present

## 2015-12-27 ENCOUNTER — Other Ambulatory Visit: Payer: Self-pay | Admitting: Internal Medicine

## 2016-01-17 ENCOUNTER — Telehealth: Payer: Self-pay | Admitting: Nurse Practitioner

## 2016-01-17 NOTE — Telephone Encounter (Signed)
Patient called and left a message at lunch requesting an prescription to "treat a uti." She said, "It's the same thing as before. I don't need an appointment just a prescription."  I called the patient back to schedule her but had to leave a message. Routing to triage for FYI. Please schedule patient when she calls back.

## 2016-01-19 NOTE — Telephone Encounter (Signed)
Called patient to see how she was doing & see if she needed an appt for uti. Pt states she took the antibiotics dr Quincy Simmonds had given her previously for a uti. Pt aware of risk of taking antibiotic with being unsure if the med she took would treat it. Pt states she feels much better & is aware that if she starts to experience symptoms again that she needs to come in for appt. Pt agrees.

## 2016-01-19 NOTE — Telephone Encounter (Signed)
OK to close the note since she is aware to CB if symptoms return/ persist.

## 2016-02-23 IMAGING — DX DG CERVICAL SPINE FLEX&EXT ONLY
2 series · 2 of 2 positions shown · non-contrast
Comparison: CT cervical spine 08/30/2014.

CLINICAL DATA: Posterior neck pain, initial encounter.

EXAM:
CERVICAL SPINE - FLEXION AND EXTENSION VIEWS ONLY

[c-spine flex]
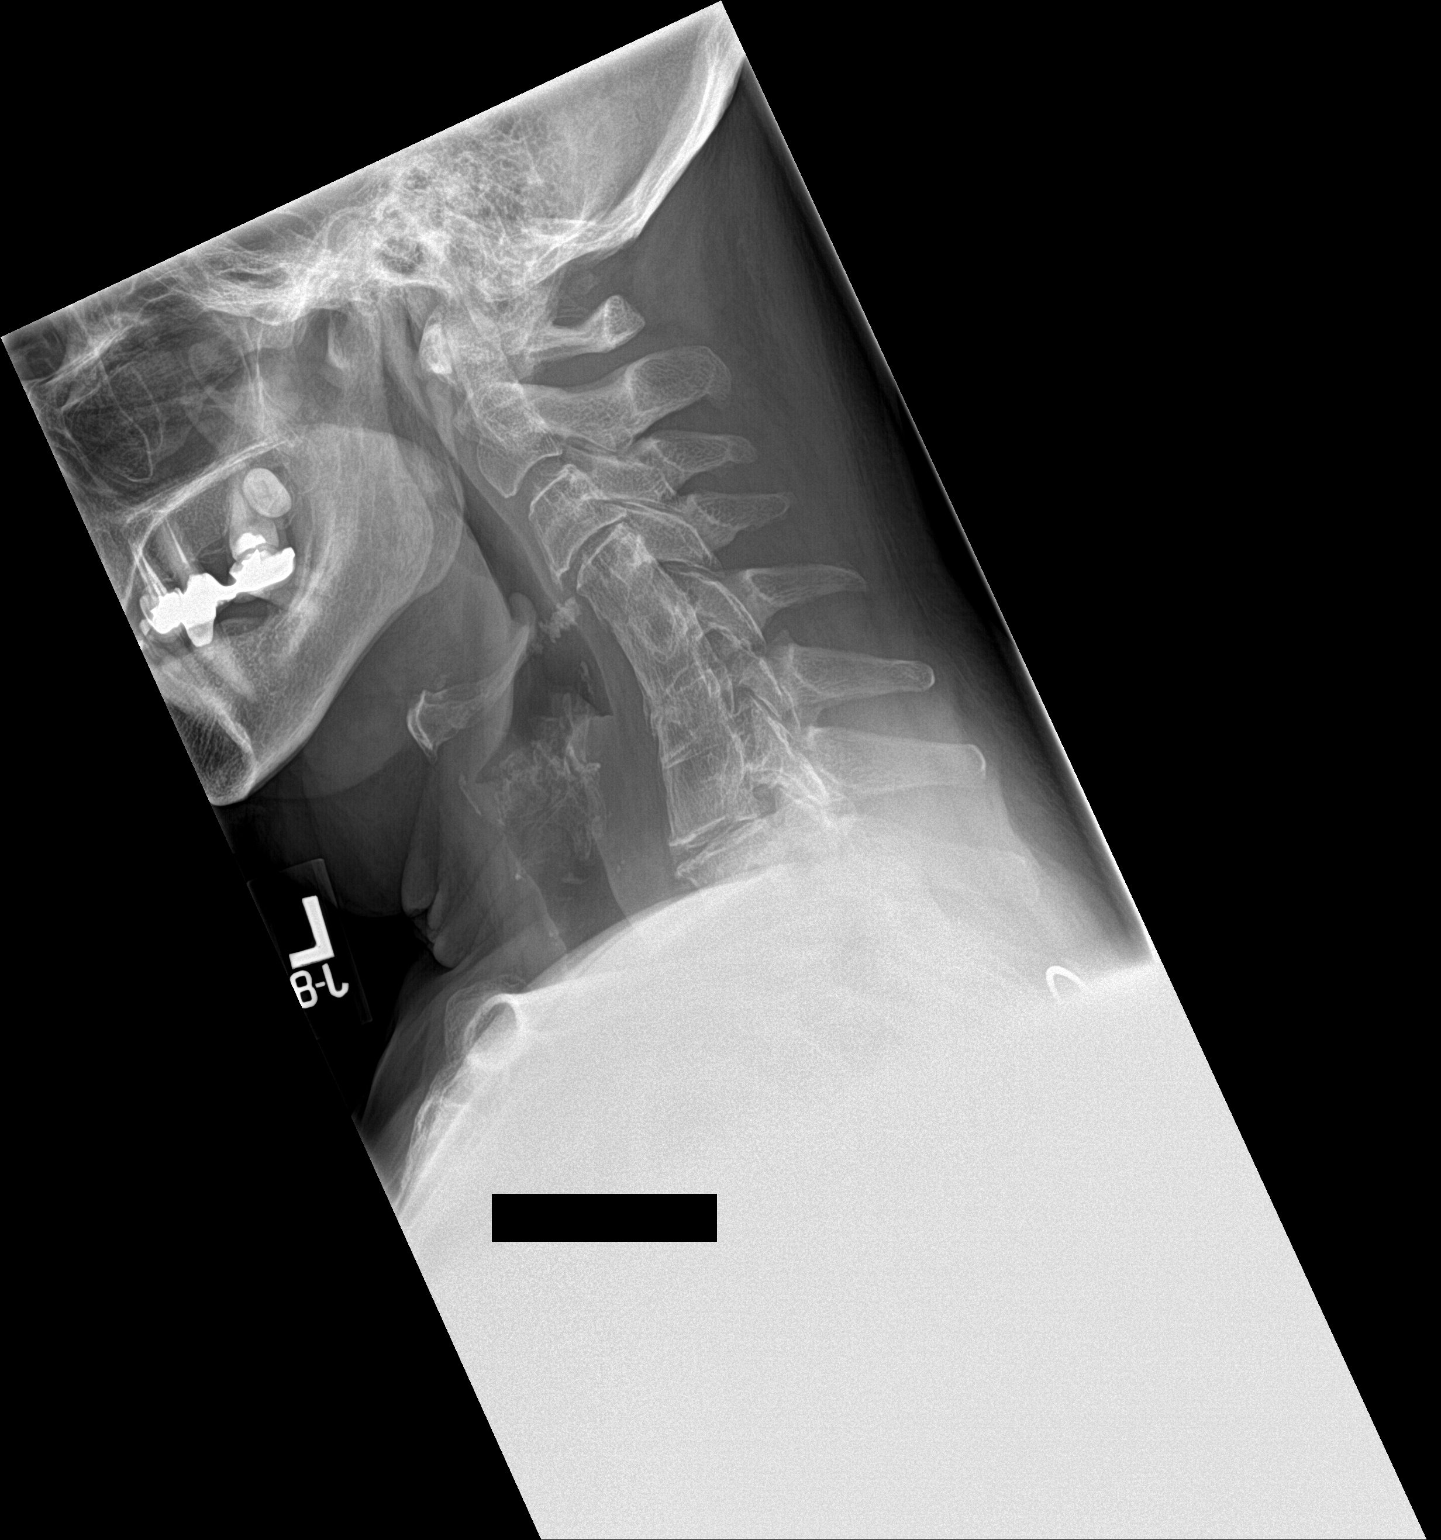

[c-spine ext]
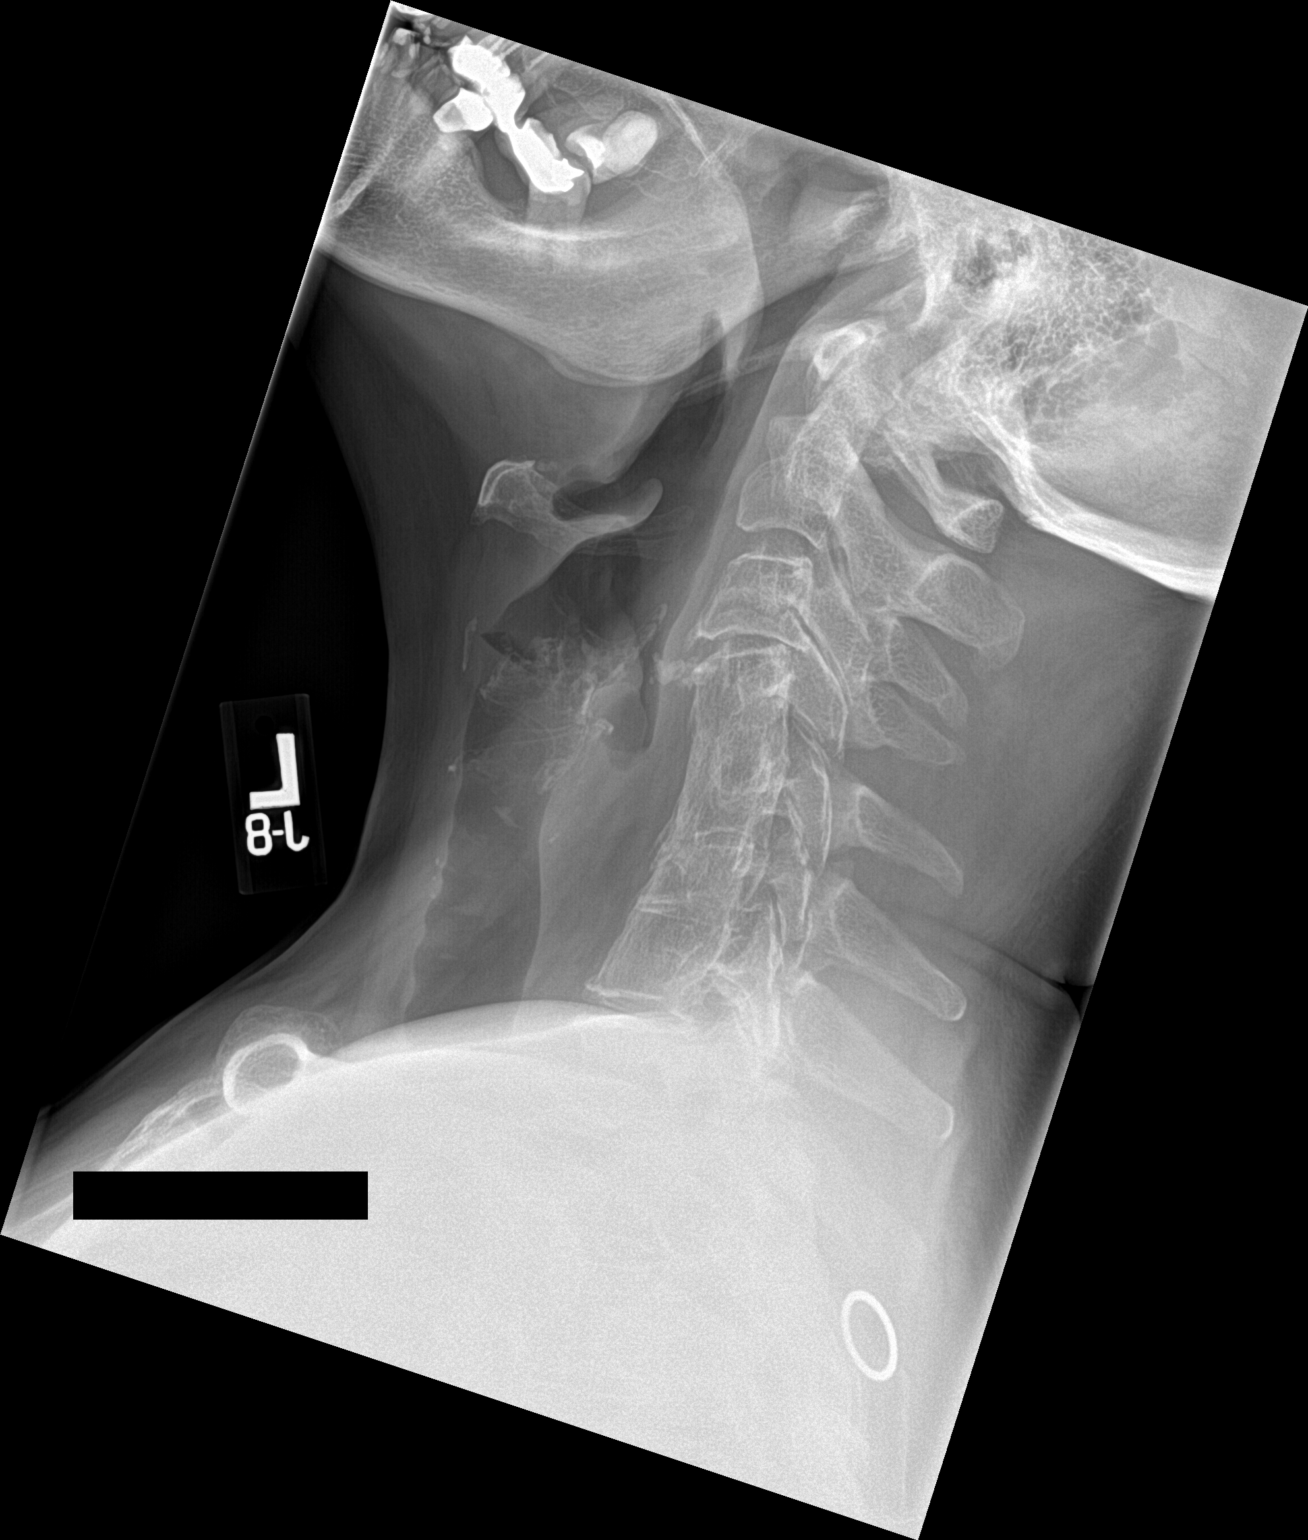

[2 of 2 positions shown; findings below may reference images not displayed]

FINDINGS: The cervical spine is visualized from the occiput to the
cervicothoracic junction, in flexion and extension. Nondisplaced
fractures of the views C5-6 segment are better seen on
cross-sectional imaging performed yesterday. Approximately 2 mm
retrolisthesis of C3 on C4 on the flexion view, which increases to 3
mm on extension. There is overall reversal of the normal cervical
lordosis secondary to C4-7 fusion. Vertebral body height is
maintained. Endplate degenerative changes are seen at C3-4 and
C7-T1, with slight loss of disc space height. Prevertebral soft
tissues are within normal limits.
IMPRESSION: 1. Nondisplaced fractures of the fused C5-6 segment are better seen
on cross-sectional imaging performed yesterday.
2. Slight retrolisthesis of C3 on C4, which appears to increase
minimally on extension.
3. Reversal of the normal cervical lordosis, secondary to C4-7
fusion.
4. C3-4 and C7-T1 degenerative disc disease.

## 2016-03-07 ENCOUNTER — Other Ambulatory Visit: Payer: Self-pay | Admitting: *Deleted

## 2016-03-07 MED ORDER — ESCITALOPRAM OXALATE 10 MG PO TABS: 10.0000 mg | ORAL_TABLET | Freq: Every day | ORAL | 2 refills | Status: DC

## 2016-03-07 MED ORDER — AMLODIPINE BESYLATE 5 MG PO TABS: 5.0000 mg | ORAL_TABLET | Freq: Every day | ORAL | 2 refills | Status: DC

## 2016-03-08 DIAGNOSIS — L820 Inflamed seborrheic keratosis: Secondary | ICD-10-CM | POA: Diagnosis not present

## 2016-03-10 ENCOUNTER — Other Ambulatory Visit: Payer: Self-pay | Admitting: Internal Medicine

## 2016-03-10 DIAGNOSIS — Z1231 Encounter for screening mammogram for malignant neoplasm of breast: Secondary | ICD-10-CM

## 2016-04-19 ENCOUNTER — Ambulatory Visit
Admission: RE | Admit: 2016-04-19 | Discharge: 2016-04-19 | Disposition: A | Discharge status: Home / Self Care | Payer: Medicare Other | Source: Ambulatory Visit | Attending: Internal Medicine | Admitting: Internal Medicine

## 2016-04-19 DIAGNOSIS — Z1231 Encounter for screening mammogram for malignant neoplasm of breast: Secondary | ICD-10-CM

## 2016-07-04 ENCOUNTER — Telehealth: Payer: Self-pay | Admitting: Emergency Medicine

## 2016-07-04 DIAGNOSIS — E039 Hypothyroidism, unspecified: Secondary | ICD-10-CM

## 2016-07-04 NOTE — Telephone Encounter (Signed)
Pt called and wants to know if her Thyroid medicine can be increased. She stated she was staying cold. I did let patient know Dr Jenny Reichmann is out of the office this week. Please advise thanks.

## 2016-07-06 NOTE — Telephone Encounter (Signed)
Called and spoke to patient she is aware that he will be back in the office next Tuesday and she will wait until then to discuss her medication change

## 2016-07-06 NOTE — Telephone Encounter (Signed)
Patient is calling back about this. Thanks.

## 2016-07-08 NOTE — Telephone Encounter (Signed)
Patient's last lab in July 2017 were ok, but she had some difficulty with maintaining adequate replcement in the past  OK for LAB only - TSH and Free T4, in order to see if med dose change is needed  Please let patient know as well that as I am not sure of her current insurance coverage, traditional medicare (but not the commercial medicares such as blue cross medicare or [employer]) may not pay for the testing without an office visit

## 2016-07-08 NOTE — Addendum Note (Signed)
Addended by: Biagio Borg on: 07/08/2016 11:51 PM   Modules accepted: Orders

## 2016-07-14 ENCOUNTER — Telehealth: Payer: Self-pay | Admitting: Internal Medicine

## 2016-07-14 ENCOUNTER — Other Ambulatory Visit: Payer: Self-pay | Admitting: Internal Medicine

## 2016-07-14 ENCOUNTER — Ambulatory Visit: Payer: Medicare Other

## 2016-07-14 DIAGNOSIS — E039 Hypothyroidism, unspecified: Secondary | ICD-10-CM | POA: Diagnosis not present

## 2016-07-14 NOTE — Telephone Encounter (Signed)
Received a call from [facility] regarding annual wellness visit for patient. Scheduled annual wellness for February 2018. Called Lauren [name]  to verify that she had spoken with [facility]. Patient stated that she spoke with [facility] and that she  wanted to cx appt for annual wellness. Pt said she has a CPE scheduled for June. Patient stated that she is not interested in an annual wellness at this time.

## 2016-07-14 NOTE — Telephone Encounter (Signed)
Requisitions printed and given to patient.

## 2016-07-14 NOTE — Addendum Note (Signed)
Addended by: Ander Slade on: 07/14/2016 10:40 AM   Modules accepted: Orders

## 2016-07-14 NOTE — Telephone Encounter (Signed)
Reordering labs with [facility] as resulting agency.

## 2016-07-15 LAB — TSH: TSH: 6.46 u[IU]/mL — AB (ref 0.450–4.500)

## 2016-07-15 LAB — T4, FREE: FREE T4: 1.09 ng/dL (ref 0.82–1.77)

## 2016-07-19 ENCOUNTER — Telehealth: Payer: Self-pay | Admitting: Internal Medicine

## 2016-07-19 DIAGNOSIS — E039 Hypothyroidism, unspecified: Secondary | ICD-10-CM

## 2016-07-19 NOTE — Telephone Encounter (Signed)
Pt called in and said that she would like someone to call her with her lab results

## 2016-07-20 MED ORDER — LEVOTHYROXINE SODIUM 50 MCG PO TABS: 50.0000 ug | ORAL_TABLET | Freq: Every day | ORAL | 3 refills | Status: DC

## 2016-07-20 NOTE — Telephone Encounter (Signed)
Spoke to pt and gave results. Pt states that she is having symptoms and feels that her treatment needs to be changed. Sx include tired, weight gain and fatigue  [facility] is the pt pharmacy

## 2016-07-20 NOTE — Telephone Encounter (Signed)
[facility] on [address] is the lab pt would like to go to.   Pt informed in increased dose of Levothyroxine.

## 2016-07-20 NOTE — Telephone Encounter (Signed)
Ok for change levothyroxine from 25 to 50 mcg per day  Please repeat LAB only in 4 weeks

## 2016-07-25 NOTE — Telephone Encounter (Signed)
Faxed requisition to [phone].

## 2016-08-02 ENCOUNTER — Other Ambulatory Visit: Payer: Self-pay | Admitting: Internal Medicine

## 2016-08-18 ENCOUNTER — Other Ambulatory Visit: Payer: Self-pay | Admitting: Internal Medicine

## 2016-08-18 DIAGNOSIS — E039 Hypothyroidism, unspecified: Secondary | ICD-10-CM | POA: Diagnosis not present

## 2016-08-18 LAB — TSH: TSH: 2.84 u[IU]/mL (ref 0.41–5.90)

## 2016-08-19 LAB — TSH: TSH: 2.84 u[IU]/mL (ref 0.450–4.500)

## 2016-08-19 LAB — T4, FREE: FREE T4: 1.17 ng/dL (ref 0.82–1.77)

## 2016-08-28 ENCOUNTER — Telehealth: Payer: Self-pay | Admitting: Nurse Practitioner

## 2016-08-28 ENCOUNTER — Telehealth: Payer: Self-pay | Admitting: Obstetrics and Gynecology

## 2016-08-28 NOTE — Telephone Encounter (Signed)
Patient would like a hemocult kit mailed to her if possible.  °

## 2016-08-28 NOTE — Telephone Encounter (Signed)
Patient said "I received a notice from Dr.Silva that I am due for a colonoscopy". Patient is asking if we schedule this appointment for her?

## 2016-08-28 NOTE — Telephone Encounter (Signed)
Spoke with patient. Patient states she is due for colonoscopy, do I call to schedule with Dr. Maurene Capes or will your office call? Advised patient RN can assist with scheduling or patient can call Dr. Ricky Stabs office directly for scheduling colonoscopy. Patient states she will call to schedule. Advised patient should she need any additional assistance, return call to office at [phone]. Patient verbalizes understanding and is thankful for return call.  Routing to provider for final review. Patient is agreeable to disposition. Will close encounter.

## 2016-08-28 NOTE — Telephone Encounter (Signed)
Kem Boroughs, NP -patient requesting IFOB, last AEX 10/20/15, last colonoscopy 08/2009. Spoke with patient earlier today about referral for colonoscopy- see first telephone encounter dated 4/9. IFOB 10/21/15 negative.   Kem Boroughs, NP -Please advise?

## 2016-08-29 NOTE — Telephone Encounter (Signed)
When colonoscopy is done - after 5 yrs I do give them IFOB until next one is done.  She did get one in 2017 and was negative.  She is due one this year.  It can be given now or at her AEX on 10/2016.  Which ever is better for her is OK.  We do not charge for her until it is returned.  There is a heat concern with mailing this to her - the collection serum is affected with heat.  So mailing her the kit or her mailing back to [place]-  the kit can not sit in mailbox. Better if she could pick this up - her choice if she can watch for postal service.

## 2016-08-29 NOTE — Telephone Encounter (Signed)
Left message to call Tena Linebaugh at [phone].  

## 2016-08-31 NOTE — Telephone Encounter (Signed)
Spoke with patient and advised as seen below per Kem Boroughs, NP. Patient states she had gotten a reminder message through MyChart that colonoscopy was due. Patient followed up with Dr. Maurene Capes, not due intil 2021. Patient thought maybe reminder was for the IFOB. Patient states she will just plan to pick IFOB up at [facility] in June. Patient thankful for return call and verbalizes understanding.  Routing to provider for final review. Patient is agreeable to disposition. Will close encounter.

## 2016-09-03 ENCOUNTER — Other Ambulatory Visit: Payer: Self-pay | Admitting: Neurology

## 2016-10-19 ENCOUNTER — Encounter: Payer: Self-pay | Admitting: Nurse Practitioner

## 2016-10-19 NOTE — Progress Notes (Signed)
Patient ID: Lauren [name], female   DOB: 1945-12-01, 71 y.o.   MRN: [id number]  71 y.o. G30P2002 Married  Caucasian Fe here for annual exam. Pt has some flares of LSA and uses Triamcinolone prn.  Seems to flare up worse when she has diarrhea or area is moist or wet for a while.   Now seeing PCP and thyroid has been hard to control and having changes doses of medication.  She also finding harder to loose weight despite lowering calories and walking daily.  Patient's last menstrual period was 05/22/1996.          Sexually active: Yes.    The current method of family planning is status post hysterectomy.    Exercising: Yes.    walking Smoker:  no  Health Maintenance: Pap: 03/17/09, Negative (hysterectomy due to fibroids) History of Abnormal Pap: no MMG: 04/19/16, 3D-yes, Density Category C, Bi-Rads 1:  Negative Self Breast exams: yes Colonoscopy: 08/20/09, severe diverticulosis, repeat in 10 years; IFOB is given today BMD: 08/27/14 T Score: -1.7 Spine / -2.2 Left Femur Neck, right hip not tested TDaP: 05/31/12 Shingles: 10/15/14 Pneumonia: 10/08/13 Prevnar-13, 5/225/16 Pneumovax Hep C and HIV: PCP -- per patient negative Labs: PCP takes care of screening labs   reports that she has never smoked. She has never used smokeless tobacco. She reports that she does not drink alcohol or use drugs.  Past Medical History:  Diagnosis Date  . Anxiety   . Arthritis   . Carotid stenosis 10/08/2013  . Cervical disc disease   . Depression   . Diverticulitis   . Diverticulosis   . Frequent headaches   . Hypertension   . Hypothyroidism 10/26/2015  . Impaired glucose tolerance 10/08/2013   normal HgA1C  . Neck pain   . Psoriasis     Past Surgical History:  Procedure Laterality Date  . CERVICAL FUSION     approx 1980  . CHOLECYSTECTOMY    . POSTERIOR CERVICAL FUSION/FORAMINOTOMY N/A 11/05/2014   Procedure: Cervical four to Cervical seven  Posterior Cervical Fusion with lateral mass fixation;  Surgeon:  Eustace Moore, MD;  Location: [facility];  Service: Neurosurgery;  Laterality: N/A;   C4 - C7 Posterior Cervical Fusion with lateral mass fixation  . TOTAL ABDOMINAL HYSTERECTOMY  1998   Fibroid    Current Outpatient Prescriptions  Medication Sig Dispense Refill  . amLODipine (NORVASC) 5 MG tablet Take 1 tablet (5 mg total) by mouth daily. 90 tablet 2  . aspirin EC 325 MG tablet Take 325 mg by mouth daily.    . calcium carbonate (OS-CAL) 1250 (500 CA) MG chewable tablet Chew 2 tablets by mouth daily.    Marland Kitchen escitalopram (LEXAPRO) 10 MG tablet Take 1 tablet (10 mg total) by mouth daily. 90 tablet 2  . hydrochlorothiazide (MICROZIDE) 12.5 MG capsule Take 1 capsule (12.5 mg total) by mouth daily. Yearly physical w/labs due in June must see MD for refills 90 capsule 0  . levothyroxine (SYNTHROID, LEVOTHROID) 50 MCG tablet Take 1 tablet (50 mcg total) by mouth daily. 90 tablet 3  . polyethylene glycol (MIRALAX / GLYCOLAX) packet Take 17 g by mouth daily.    . Vitamin D, Ergocalciferol, (DRISDOL) 50000 UNITS CAPS capsule Take 1 capsule (50,000 Units total) by mouth every 7 (seven) days. (Patient taking differently: Take 50,000 Units by mouth every 7 (seven) days. wednesday) 12 capsule 0  . zolpidem (AMBIEN) 5 MG tablet Take 1 tablet (5 mg total) by mouth at  bedtime as needed. for sleep (Patient not taking: Reported on 10/23/2016) 30 tablet 2   No current facility-administered medications for this visit.     Family History  Problem Relation Age of Onset  . Heart disease Mother   . Diabetes Mother   . Cervical cancer Mother   . Heart attack Mother   . Lung cancer Father   . Alcoholism Father   . Diabetes Brother   . Throat cancer Brother   . Cancer Other        lung cancer  . Heart disease Other   . Hypertension Other   . Arthritis Other   . Alcohol abuse Other     ROS:  Pertinent items are noted in HPI.  Otherwise, a comprehensive ROS was negative.  Exam:   BP 122/70 (BP Location:  Right Arm, Patient Position: Sitting, Cuff Size: Normal)   Pulse 80   Resp 16   Ht 5\' 4"  (1.626 m)   Wt 147 lb (66.7 kg)   LMP 05/22/1996   BMI 25.23 kg/m  Height: 5\' 4"  (162.6 cm) Ht Readings from Last 3 Encounters:  10/23/16 5\' 4"  (1.626 m)  10/20/15 5\' 4"  (1.626 m)  09/17/15 5\' 5"  (1.651 m)    General appearance: alert, cooperative and appears stated age Head: Normocephalic, without obvious abnormality, atraumatic Neck: no adenopathy, supple, symmetrical, trachea midline and thyroid normal to inspection and palpation Lungs: clear to auscultation bilaterally Breasts: normal appearance, no masses or tenderness Heart: regular rate and rhythm Abdomen: soft, non-tender; no masses,  no organomegaly Extremities: extremities normal, atraumatic, no cyanosis or edema Skin: Skin color, texture, turgor normal. No rashes or lesions Lymph nodes: Cervical, supraclavicular, and axillary nodes normal. No abnormal inguinal nodes palpated Neurologic: Grossly normal   Pelvic: External genitalia:   lesions that are c/w LSA at the top of labia and dermatitis still present at the perianal areas.              Urethra:  normal appearing urethra with no masses, tenderness or lesions              Bartholin's and Skene's: normal                 Vagina: normal appearing vagina with normal color and discharge, no lesions              Cervix: absent              Pap taken: No. Bimanual Exam:  Uterus:  uterus absent              Adnexa: no mass, fullness, tenderness               Rectovaginal: Confirms               Anus:  normal sphincter tone, no lesions  Chaperone present: yes  A:  Well Woman with normal exam  S/P TAH/BSO 1998 secondary to fibroids  Took ERT 1998 - 04/2006  History of anxiety  History of LSA / spongiotic dermatitis on vulva and perianal area - diagnosed 06/2013  P:   Reviewed health and wellness pertinent to exam  Pap smear: no  Mammogram is due 11/18 and will repeat BMD at  same time - order is placed.  Refill on Triamcinolone cream prn  Counseled on breast self exam, mammography screening, adequate intake of calcium and vitamin D, diet and exercise return annually or prn  An After Visit Summary was printed and given  to the patient.

## 2016-10-22 ENCOUNTER — Other Ambulatory Visit: Payer: Self-pay | Admitting: Internal Medicine

## 2016-10-23 ENCOUNTER — Ambulatory Visit (INDEPENDENT_AMBULATORY_CARE_PROVIDER_SITE_OTHER): Payer: Medicare Other | Admitting: Nurse Practitioner

## 2016-10-23 ENCOUNTER — Encounter: Payer: Self-pay | Admitting: Nurse Practitioner

## 2016-10-23 VITALS — BP 122/70 | HR 80 | Resp 16 | Ht 64.0 in | Wt 147.0 lb

## 2016-10-23 DIAGNOSIS — Z01419 Encounter for gynecological examination (general) (routine) without abnormal findings: Secondary | ICD-10-CM

## 2016-10-23 DIAGNOSIS — M8580 Other specified disorders of bone density and structure, unspecified site: Secondary | ICD-10-CM

## 2016-10-23 DIAGNOSIS — E559 Vitamin D deficiency, unspecified: Secondary | ICD-10-CM

## 2016-10-23 DIAGNOSIS — N952 Postmenopausal atrophic vaginitis: Secondary | ICD-10-CM | POA: Diagnosis not present

## 2016-10-23 DIAGNOSIS — Z1211 Encounter for screening for malignant neoplasm of colon: Secondary | ICD-10-CM | POA: Diagnosis not present

## 2016-10-23 MED ORDER — TRIAMCINOLONE ACETONIDE 0.025 % EX OINT
1.0000 | TOPICAL_OINTMENT | Freq: Two times a day (BID) | CUTANEOUS | 6 refills | Status: DC
Start: 2016-10-23 — End: 2017-05-24

## 2016-10-23 NOTE — Patient Instructions (Signed)

## 2016-10-24 ENCOUNTER — Other Ambulatory Visit: Payer: Self-pay | Admitting: Internal Medicine

## 2016-10-24 ENCOUNTER — Encounter: Payer: Self-pay | Admitting: Internal Medicine

## 2016-10-24 ENCOUNTER — Ambulatory Visit (INDEPENDENT_AMBULATORY_CARE_PROVIDER_SITE_OTHER): Payer: Medicare Other | Admitting: Internal Medicine

## 2016-10-24 VITALS — BP 136/80 | HR 70 | Ht 64.0 in | Wt 148.0 lb

## 2016-10-24 DIAGNOSIS — R7302 Impaired glucose tolerance (oral): Secondary | ICD-10-CM

## 2016-10-24 DIAGNOSIS — Z0000 Encounter for general adult medical examination without abnormal findings: Secondary | ICD-10-CM

## 2016-10-24 NOTE — Assessment & Plan Note (Addendum)
Overall doing well, age appropriate education and counseling updated, referrals for preventative services and immunizations addressed, dietary and smoking counseling addressed, most recent labs reviewed.  I have personally reviewed and have noted:  1) the patient's medical and social history 2) The pt's use of alcohol, tobacco, and illicit drugs 3) The patient's current medications and supplements 4) Functional ability including ADL's, fall risk, home safety risk, hearing and visual impairment 5) Diet and physical activities 6) Evidence for depression or mood disorder 7) The patient's height, weight, and BMI have been recorded in the chart  I have made referrals, and provided counseling and education based on review of the above Written order done for labs to be done at [facility] as required by her insurance

## 2016-10-24 NOTE — Patient Instructions (Signed)

## 2016-10-24 NOTE — Progress Notes (Signed)
Subjective:    Patient ID: Lauren [name], female    DOB: 19-Jun-1945, 71 y.o.   MRN: [id number]  HPI  Here for wellness and f/u;  Overall doing ok;  Pt denies Chest pain, worsening SOB, DOE, wheezing, orthopnea, PND, worsening LE edema, palpitations, dizziness or syncope.  Pt denies neurological change such as new headache, facial or extremity weakness.  Pt denies polydipsia, polyuria, or low sugar symptoms. Pt states overall good compliance with treatment and medications, good tolerability, and has been trying to follow appropriate diet.  Pt denies worsening depressive symptoms, suicidal ideation or panic. No fever, night sweats, wt loss, loss of appetite, or other constitutional symptoms.  Pt states good ability with ADL's, has low fall risk, home safety reviewed and adequate, no other significant changes in hearing or vision, and only occasionally active with exercise, walks the neighborhood for at least 1 mile. Wt Readings from Last 3 Encounters:  10/24/16 148 lb (67.1 kg)  10/23/16 147 lb (66.7 kg)  10/22/15 150 lb (68 kg)  Saw GYn yesterday, no pap but pelvic done and mammgrom up to date in Nov 2017, and is scheduled for DXA next nov 2017.  No new complaints Past Medical History:  Diagnosis Date  . Anxiety   . Arthritis   . Carotid stenosis 10/08/2013  . Cervical disc disease   . Depression   . Diverticulitis   . Diverticulosis   . Frequent headaches   . Hypertension   . Hypothyroidism 10/26/2015  . Impaired glucose tolerance 10/08/2013   normal HgA1C  . Neck pain   . Psoriasis    Past Surgical History:  Procedure Laterality Date  . CERVICAL FUSION     approx 1980  . CHOLECYSTECTOMY    . POSTERIOR CERVICAL FUSION/FORAMINOTOMY N/A 11/05/2014   Procedure: Cervical four to Cervical seven  Posterior Cervical Fusion with lateral mass fixation;  Surgeon: Eustace Moore, MD;  Location: [facility];  Service: Neurosurgery;  Laterality: N/A;   C4 - C7 Posterior Cervical Fusion with  lateral mass fixation  . TOTAL ABDOMINAL HYSTERECTOMY  1998   Fibroid    reports that she has never smoked. She has never used smokeless tobacco. She reports that she does not drink alcohol or use drugs. family history includes Alcohol abuse in her other; Alcoholism in her father; Arthritis in her other; Cancer in her other; Cervical cancer in her mother; Diabetes in her brother and mother; Heart attack in her mother; Heart disease in her mother and other; Hypertension in her other; Lung cancer in her father; Throat cancer in her brother. Allergies  Allergen Reactions  . Celebrex [Celecoxib] Itching and Rash   Current Outpatient Prescriptions on File Prior to Visit  Medication Sig Dispense Refill  . amLODipine (NORVASC) 5 MG tablet Take 1 tablet (5 mg total) by mouth daily. 90 tablet 2  . aspirin EC 325 MG tablet Take 325 mg by mouth daily.    . calcium carbonate (OS-CAL) 1250 (500 CA) MG chewable tablet Chew 2 tablets by mouth daily.    . cholecalciferol (VITAMIN D) 1000 units tablet Take 1,000 Units by mouth daily.    Marland Kitchen escitalopram (LEXAPRO) 10 MG tablet Take 1 tablet (10 mg total) by mouth daily. 90 tablet 2  . hydrochlorothiazide (MICROZIDE) 12.5 MG capsule Take 1 capsule (12.5 mg total) by mouth daily. Yearly physical w/labs due in June must see MD for refills 90 capsule 0  . levothyroxine (SYNTHROID, LEVOTHROID) 50 MCG tablet Take 1  tablet (50 mcg total) by mouth daily. 90 tablet 3  . polyethylene glycol (MIRALAX / GLYCOLAX) packet Take 17 g by mouth daily.    Marland Kitchen triamcinolone (KENALOG) 0.025 % ointment Apply 1 application topically 2 (two) times daily. 30 g 6  . zolpidem (AMBIEN) 5 MG tablet Take 1 tablet (5 mg total) by mouth at bedtime as needed. for sleep 30 tablet 2   No current facility-administered medications on file prior to visit.    Review of Systems Constitutional: Negative for other unusual diaphoresis, sweats, appetite or weight changes HENT: Negative for other  worsening hearing loss, ear pain, facial swelling, mouth sores or neck stiffness.   Eyes: Negative for other worsening pain, redness or other visual disturbance.  Respiratory: Negative for other stridor or swelling Cardiovascular: Negative for other palpitations or other chest pain  Gastrointestinal: Negative for worsening diarrhea or loose stools, blood in stool, distention or other pain Genitourinary: Negative for hematuria, flank pain or other change in urine volume.  Musculoskeletal: Negative for myalgias or other joint swelling.  Skin: Negative for other color change, or other wound or worsening drainage.  Neurological: Negative for other syncope or numbness. Hematological: Negative for other adenopathy or swelling Psychiatric/Behavioral: Negative for hallucinations, other worsening agitation, SI, self-injury, or new decreased concentration All other system neg per pt    Objective:   Physical Exam BP 136/80   Pulse 70   Ht 5\' 4"  (1.626 m)   Wt 148 lb (67.1 kg)   LMP 05/22/1996   SpO2 96%   BMI 25.40 kg/m   VS noted, minimal overwt, younger than age appearing Constitutional: Pt is oriented to person, place, and time. Appears well-developed and well-nourished, in no significant distress and comfortable Head: Normocephalic and atraumatic  Eyes: Conjunctivae and EOM are normal. Pupils are equal, round, and reactive to light Right Ear: External ear normal without discharge Left Ear: External ear normal without discharge Nose: Nose without discharge or deformity Mouth/Throat: Oropharynx is without other ulcerations and moist  Neck: Normal range of motion. Neck supple. No JVD present. No tracheal deviation present or significant neck LA or mass Cardiovascular: Normal rate, regular rhythm, normal heart sounds and intact distal pulses.   Pulmonary/Chest: WOB normal and breath sounds without rales or wheezing  Abdominal: Soft. Bowel sounds are normal. NT. No HSM  Musculoskeletal: Normal  range of motion. Exhibits no edema Lymphadenopathy: Has no other cervical adenopathy.  Neurological: Pt is alert and oriented to person, place, and time. Pt has normal reflexes. No cranial nerve deficit. Motor grossly intact, Gait intact Skin: Skin is warm and dry. No rash noted or new ulcerations Psychiatric:  Has normal mood and affect. Behavior is normal without agitation No other exam findings  Lab Results  Component Value Date   WBC 5.9 10/25/2015   HGB 12.4 10/25/2015   HCT 37.1 10/25/2015   PLT 267.0 10/25/2015   GLUCOSE 80 10/25/2015   CHOL 152 10/25/2015   TRIG 132.0 10/25/2015   HDL 37.60 (L) 10/25/2015   LDLDIRECT 58.5 03/09/2014   LDLCALC 88 10/25/2015   ALT 15 10/25/2015   AST 20 10/25/2015   NA 140 10/25/2015   K 3.8 10/25/2015   CL 104 10/25/2015   CREATININE 0.69 10/25/2015   BUN 13 10/25/2015   CO2 30 10/25/2015   TSH 2.840 08/18/2016   INR 0.97 10/27/2014   HGBA1C 5.7 10/25/2015       Assessment & Plan:

## 2016-10-24 NOTE — Assessment & Plan Note (Signed)
stable overall by history and exam, recent data reviewed with pt, and pt to continue medical treatment as before,  to f/u any worsening symptoms or concerns Lab Results  Component Value Date   HGBA1C 5.7 10/25/2015

## 2016-10-25 ENCOUNTER — Telehealth: Payer: Self-pay | Admitting: Internal Medicine

## 2016-10-25 LAB — LIPID PANEL W/O CHOL/HDL RATIO
CHOLESTEROL TOTAL: 166 mg/dL (ref 100–199)
HDL: 32 mg/dL — ABNORMAL LOW (ref >39)
LDL Calculated: 87 mg/dL (ref 0–99)
TRIGLYCERIDES: 237 mg/dL — AB (ref 0–149)
VLDL CHOLESTEROL CAL: 47 mg/dL — AB (ref 5–40)

## 2016-10-25 LAB — MICROSCOPIC EXAMINATION: CASTS: NONE SEEN /LPF

## 2016-10-25 LAB — CBC WITH DIFFERENTIAL/PLATELET
Basophils Absolute: 0.1 10*3/uL (ref 0.0–0.2)
Basos: 1 %
EOS (ABSOLUTE): 0.2 10*3/uL (ref 0.0–0.4)
EOS: 3 %
HEMATOCRIT: 39.4 % (ref 34.0–46.6)
HEMOGLOBIN: 12.4 g/dL (ref 11.1–15.9)
Immature Grans (Abs): 0 10*3/uL (ref 0.0–0.1)
Immature Granulocytes: 0 %
LYMPHS ABS: 1.8 10*3/uL (ref 0.7–3.1)
Lymphs: 29 %
MCH: 29.9 pg (ref 26.6–33.0)
MCHC: 31.5 g/dL (ref 31.5–35.7)
MCV: 95 fL (ref 79–97)
MONOCYTES: 9 %
MONOS ABS: 0.6 10*3/uL (ref 0.1–0.9)
NEUTROS ABS: 3.8 10*3/uL (ref 1.4–7.0)
Neutrophils: 58 %
Platelets: 255 10*3/uL (ref 150–379)
RBC: 4.15 x10E6/uL (ref 3.77–5.28)
RDW: 14 % (ref 12.3–15.4)
WBC: 6.4 10*3/uL (ref 3.4–10.8)

## 2016-10-25 LAB — COMPREHENSIVE METABOLIC PANEL
ALBUMIN: 4.6 g/dL (ref 3.5–4.8)
ALK PHOS: 102 IU/L (ref 39–117)
ALT: 15 IU/L (ref 0–32)
AST: 20 IU/L (ref 0–40)
Albumin/Globulin Ratio: 1.5 (ref 1.2–2.2)
BUN / CREAT RATIO: 27 (ref 12–28)
BUN: 15 mg/dL (ref 8–27)
Bilirubin Total: 0.7 mg/dL (ref 0.0–1.2)
CO2: 30 mmol/L — AB (ref 18–29)
CREATININE: 0.55 mg/dL — AB (ref 0.57–1.00)
Calcium: 9.5 mg/dL (ref 8.7–10.3)
Chloride: 99 mmol/L (ref 96–106)
GFR calc Af Amer: 109 mL/min/{1.73_m2} (ref >59)
GFR calc non Af Amer: 95 mL/min/{1.73_m2} (ref >59)
Globulin, Total: 3 g/dL (ref 1.5–4.5)
Glucose: 87 mg/dL (ref 65–99)
Potassium: 4.6 mmol/L (ref 3.5–5.2)
SODIUM: 142 mmol/L (ref 134–144)
Total Protein: 7.6 g/dL (ref 6.0–8.5)

## 2016-10-25 LAB — URINALYSIS, COMPLETE
Bilirubin, UA: NEGATIVE
Glucose, UA: NEGATIVE
KETONES UA: NEGATIVE
Nitrite, UA: POSITIVE — AB
PROTEIN UA: NEGATIVE
RBC, UA: NEGATIVE
SPEC GRAV UA: 1.02 (ref 1.005–1.030)
Urobilinogen, Ur: 0.2 mg/dL (ref 0.2–1.0)
pH, UA: 6.5 (ref 5.0–7.5)

## 2016-10-25 LAB — TSH: TSH: 3.01 u[IU]/mL (ref 0.450–4.500)

## 2016-10-25 LAB — HGB A1C W/O EAG: Hgb A1c MFr Bld: 5.7 % — ABNORMAL HIGH (ref 4.8–5.6)

## 2016-10-25 NOTE — Progress Notes (Signed)
Encounter reviewed Maicol Bowland, MD   

## 2016-10-25 NOTE — Telephone Encounter (Signed)
Ok for shirron to call pt; received [facility] lab results  All tests OK, except there was mild elevated WBC in the urine testing, which sometimes means infection  Has pt had pain, fever, or other unusual urinary symptoms suggestive of UTI?  If so, she may need tx

## 2016-10-26 ENCOUNTER — Other Ambulatory Visit: Payer: Self-pay | Admitting: Internal Medicine

## 2016-10-26 MED ORDER — CEPHALEXIN 500 MG PO CAPS: 500.0000 mg | ORAL_CAPSULE | Freq: Three times a day (TID) | ORAL | 0 refills | Status: DC

## 2016-10-26 NOTE — Telephone Encounter (Signed)
[place] for cephalexin course - done erx to [employer] [facility]

## 2016-10-26 NOTE — Telephone Encounter (Signed)
Pt has been informed script has been sent in.

## 2016-10-26 NOTE — Telephone Encounter (Signed)
Pt state that she has not had any symptoms of a UTI, pain or fever but she is interested in being treated.

## 2016-10-31 LAB — FECAL OCCULT BLOOD, IMMUNOCHEMICAL: IFOBT: NEGATIVE

## 2016-10-31 NOTE — Addendum Note (Signed)
Addended by: Zoila Shutter D on: 10/31/2016 08:59 AM   Modules accepted: Orders

## 2016-11-17 DIAGNOSIS — H2513 Age-related nuclear cataract, bilateral: Secondary | ICD-10-CM | POA: Diagnosis not present

## 2016-12-28 ENCOUNTER — Other Ambulatory Visit: Payer: Self-pay | Admitting: Internal Medicine

## 2016-12-29 ENCOUNTER — Other Ambulatory Visit: Payer: Self-pay | Admitting: Certified Nurse Midwife

## 2016-12-29 DIAGNOSIS — M8580 Other specified disorders of bone density and structure, unspecified site: Secondary | ICD-10-CM

## 2016-12-29 DIAGNOSIS — Z1231 Encounter for screening mammogram for malignant neoplasm of breast: Secondary | ICD-10-CM

## 2017-02-05 DIAGNOSIS — Z23 Encounter for immunization: Secondary | ICD-10-CM | POA: Diagnosis not present

## 2017-02-07 ENCOUNTER — Other Ambulatory Visit: Payer: Self-pay | Admitting: Internal Medicine

## 2017-03-09 ENCOUNTER — Ambulatory Visit: Payer: Medicare Other

## 2017-03-13 NOTE — Progress Notes (Deleted)
Subjective:   Lauren [name] is a 71 y.o. female who presents for an Initial Medicare Annual Wellness Visit.  Review of Systems    No ROS.  Medicare Wellness Visit. Additional risk factors are reflected in the social history.    Sleep patterns: {SX; SLEEP PATTERNS:18802::"feels rested on waking","does not get up to void","gets up *** times nightly to void","sleeps *** hours nightly"}.    Home Safety/Smoke Alarms: Feels safe in home. Smoke alarms in place.  Living environment; residence and Firearm Safety: {Rehab home environment / accessibility:30080::"no firearms","firearms stored safely"}. Seat Belt Safety/Bike Helmet: Wears seat belt.     Objective:    There were no vitals filed for this visit. There is no height or weight on file to calculate BMI.   Current Medications (verified) Outpatient Encounter Prescriptions as of 03/14/2017  Medication Sig  . amLODipine (NORVASC) 5 MG tablet TAKE 1 TABLET BY MOUTH  DAILY  . aspirin EC 325 MG tablet Take 325 mg by mouth daily.  . calcium carbonate (OS-CAL) 1250 (500 CA) MG chewable tablet Chew 2 tablets by mouth daily.  . cholecalciferol (VITAMIN D) 1000 units tablet Take 1,000 Units by mouth daily.  Marland Kitchen escitalopram (LEXAPRO) 10 MG tablet TAKE 1 TABLET BY MOUTH  DAILY  . hydrochlorothiazide (MICROZIDE) 12.5 MG capsule TAKE 1 CAPSULE BY MOUTH  DAILY  . levothyroxine (SYNTHROID, LEVOTHROID) 50 MCG tablet Take 1 tablet (50 mcg total) by mouth daily.  . polyethylene glycol (MIRALAX / GLYCOLAX) packet Take 17 g by mouth daily.  Marland Kitchen triamcinolone (KENALOG) 0.025 % ointment Apply 1 application topically 2 (two) times daily.  Marland Kitchen zolpidem (AMBIEN) 5 MG tablet Take 1 tablet (5 mg total) by mouth at bedtime as needed. for sleep   No facility-administered encounter medications on file as of 03/14/2017.     Allergies (verified) Celebrex [celecoxib]   History: Past Medical History:  Diagnosis Date  . Anxiety   . Arthritis   . Carotid  stenosis 10/08/2013  . Cervical disc disease   . Depression   . Diverticulitis   . Diverticulosis   . Frequent headaches   . Hypertension   . Hypothyroidism 10/26/2015  . Impaired glucose tolerance 10/08/2013   normal HgA1C  . Neck pain   . Psoriasis    Past Surgical History:  Procedure Laterality Date  . CERVICAL FUSION     approx 1980  . CHOLECYSTECTOMY    . POSTERIOR CERVICAL FUSION/FORAMINOTOMY N/A 11/05/2014   Procedure: Cervical four to Cervical seven  Posterior Cervical Fusion with lateral mass fixation;  Surgeon: Eustace Moore, MD;  Location: [facility];  Service: Neurosurgery;  Laterality: N/A;   C4 - C7 Posterior Cervical Fusion with lateral mass fixation  . TOTAL ABDOMINAL HYSTERECTOMY  1998   Fibroid   Family History  Problem Relation Age of Onset  . Heart disease Mother   . Diabetes Mother   . Cervical cancer Mother   . Heart attack Mother   . Lung cancer Father   . Alcoholism Father   . Diabetes Brother   . Throat cancer Brother   . Cancer Other        lung cancer  . Heart disease Other   . Hypertension Other   . Arthritis Other   . Alcohol abuse Other    Social History   Occupational History  . retired    Social History Main Topics  . Smoking status: Never Smoker  . Smokeless tobacco: Never Used  . Alcohol use  No  . Drug use: No  . Sexual activity: Yes    Partners: Male    Birth control/ protection: Surgical     Comment: Hyst    Tobacco Counseling Counseling given: Not Answered   Activities of Daily Living No flowsheet data found.  Immunizations and Health Maintenance Immunization History  Administered Date(s) Administered  . Influenza-Unspecified 02/20/2015, 02/26/2016  . Pneumococcal Conjugate-13 10/08/2013  . Pneumococcal Polysaccharide-23 10/14/2014  . Tdap 05/31/2012  . Zoster 10/15/2014   There are no preventive care reminders to display for this patient.  Patient Care Team: Biagio Borg, MD as PCP - General (Internal  Medicine) Clydell Hakim, MD as Consulting Physician (Anesthesiology)  Indicate any recent Medical Services you may have received from other than [facility] providers in the past year (date may be approximate).     Assessment:   This is a routine wellness examination for Lauren [name]. Physical assessment deferred to PCP.   Hearing/Vision screen No exam data present  Dietary issues and exercise activities discussed:   Diet (meal preparation, eat out, water intake, caffeinated beverages, dairy products, fruits and vegetables): {Desc; diets:16563}    Goals    None     Depression Screen PHQ 2/9 Scores 10/24/2016 10/22/2015 10/14/2014 10/08/2013  PHQ - 2 Score 0 0 0 0    Fall Risk Fall Risk  10/24/2016 10/22/2015 11/16/2014 10/14/2014 10/08/2013  Falls in the past year? No Yes Yes Yes No  Number falls in past yr: - 1 1 2  or more -  Injury with Fall? - Yes - Yes -  Comment - required c-spine surgury fall 2016 - [facility]  -    Cognitive Function:        Screening Tests Health Maintenance  Topic Date Due  . MAMMOGRAM  04/19/2017  . COLONOSCOPY  08/21/2019  . TETANUS/TDAP  05/31/2022  . INFLUENZA VACCINE  Completed  . DEXA SCAN  Completed  . Hepatitis C Screening  Completed  . PNA vac Low Risk Adult  Completed      Plan:     I have personally reviewed and noted the following in the patient's chart:   . Medical and social history . Use of alcohol, tobacco or illicit drugs  . Current medications and supplements . Functional ability and status . Nutritional status . Physical activity . Advanced directives . List of other physicians . Vitals . Screenings to include cognitive, depression, and falls . Referrals and appointments  In addition, I have reviewed and discussed with patient certain preventive protocols, quality metrics, and best practice recommendations. A written personalized care plan for preventive services as well as general preventive health recommendations were provided to  patient.     Michiel Cowboy, RN   03/13/2017

## 2017-03-13 NOTE — Progress Notes (Deleted)
Pre visit review using our clinic review tool, if applicable. No additional management support is needed unless otherwise documented below in the visit note. 

## 2017-03-14 ENCOUNTER — Ambulatory Visit: Payer: Medicare Other

## 2017-04-19 ENCOUNTER — Other Ambulatory Visit: Payer: Self-pay | Admitting: Internal Medicine

## 2017-04-23 ENCOUNTER — Ambulatory Visit
Admission: RE | Admit: 2017-04-23 | Discharge: 2017-04-23 | Disposition: A | Discharge status: Home / Self Care | Payer: Medicare Other | Source: Ambulatory Visit | Attending: Certified Nurse Midwife | Admitting: Certified Nurse Midwife

## 2017-04-23 DIAGNOSIS — M8580 Other specified disorders of bone density and structure, unspecified site: Secondary | ICD-10-CM

## 2017-04-23 DIAGNOSIS — Z780 Asymptomatic menopausal state: Secondary | ICD-10-CM | POA: Diagnosis not present

## 2017-04-23 DIAGNOSIS — M8589 Other specified disorders of bone density and structure, multiple sites: Secondary | ICD-10-CM | POA: Diagnosis not present

## 2017-04-23 DIAGNOSIS — Z1231 Encounter for screening mammogram for malignant neoplasm of breast: Secondary | ICD-10-CM

## 2017-04-24 ENCOUNTER — Telehealth: Payer: Self-pay

## 2017-04-24 NOTE — Telephone Encounter (Signed)
-----   Message from Regina Eck, CNM sent at 04/23/2017  8:48 AM EST ----- Notify patient that osteopenia noted in left hip neck, but there is no change since last exam in 2016.  Actually increase in BMD of spine since last exam. Continue Calcium and Vitamin D and exercise. Repeat in 2 years

## 2017-04-24 NOTE — Telephone Encounter (Signed)
Patient notified of results. See bmd

## 2017-04-24 NOTE — Telephone Encounter (Signed)
[email or web address]

## 2017-05-13 ENCOUNTER — Ambulatory Visit (HOSPITAL_COMMUNITY)
Admission: EM | Admit: 2017-05-13 | Discharge: 2017-05-13 | Disposition: A | Discharge status: Home / Self Care | Payer: Medicare Other | Attending: Family Medicine | Admitting: Family Medicine

## 2017-05-13 ENCOUNTER — Encounter (HOSPITAL_COMMUNITY): Payer: Self-pay | Admitting: Emergency Medicine

## 2017-05-13 ENCOUNTER — Other Ambulatory Visit: Payer: Self-pay

## 2017-05-13 DIAGNOSIS — B354 Tinea corporis: Secondary | ICD-10-CM | POA: Diagnosis not present

## 2017-05-13 MED ORDER — KETOCONAZOLE 2 % EX CREA: 1.0000 "application " | TOPICAL_CREAM | Freq: Two times a day (BID) | CUTANEOUS | 0 refills | Status: DC

## 2017-05-13 NOTE — ED Provider Notes (Signed)
[facility]   [id number] 05/13/17 Arrival Time: 1207   SUBJECTIVE:  Lauren [name] is a 71 y.o. female who presents to the urgent care with complaint of rash in between her thighs for several days, tried triamcinolone cream without relief.   Rash is both medial thighs, burning and itching, and spreading   Past Medical History:  Diagnosis Date  . Anxiety   . Arthritis   . Carotid stenosis 10/08/2013  . Cervical disc disease   . Depression   . Diverticulitis   . Diverticulosis   . Frequent headaches   . Hypertension   . Hypothyroidism 10/26/2015  . Impaired glucose tolerance 10/08/2013   normal HgA1C  . Neck pain   . Psoriasis    Family History  Problem Relation Age of Onset  . Heart disease Mother   . Diabetes Mother   . Cervical cancer Mother   . Heart attack Mother   . Lung cancer Father   . Alcoholism Father   . Diabetes Brother   . Throat cancer Brother   . Cancer Other        lung cancer  . Heart disease Other   . Hypertension Other   . Arthritis Other   . Alcohol abuse Other    Social History   Socioeconomic History  . Marital status: Married    Spouse name: Not on file  . Number of children: 2  . Years of education: 25  . Highest education level: Not on file  Social Needs  . Financial resource strain: Not on file  . Food insecurity - worry: Not on file  . Food insecurity - inability: Not on file  . Transportation needs - medical: Not on file  . Transportation needs - non-medical: Not on file  Occupational History  . Occupation: retired  Tobacco Use  . Smoking status: Never Smoker  . Smokeless tobacco: Never Used  Substance and Sexual Activity  . Alcohol use: No    Alcohol/week: 0.0 oz  . Drug use: No  . Sexual activity: Yes    Partners: Male    Birth control/protection: Surgical    Comment: Hyst  Other Topics Concern  . Not on file  Social History Narrative   Lives at home with husband and son.   Right-handed.   2 cups  caffeine daily.   Current Meds  Medication Sig  . zolpidem (AMBIEN) 5 MG tablet Take 1 tablet (5 mg total) by mouth at bedtime as needed. for sleep   Allergies  Allergen Reactions  . Celebrex [Celecoxib] Itching and Rash      ROS: As per HPI, remainder of ROS negative.   OBJECTIVE:   Vitals:   05/13/17 1241  BP: 124/69  Pulse: 80  Resp: 18  Temp: 98.8 F (37.1 C)  SpO2: 100%     General appearance: alert; no distress Eyes: PERRL; EOMI; conjunctiva normal HENT: normocephalic; atraumatic;  Neck: supple Back: no CVA tenderness Extremities: no cyanosis or edema; symmetrical with no gross deformities Skin: warm and dry, crusty lesions symmetrically located inner thighs of various sizes Neurologic: normal gait; grossly normal Psychological: alert and cooperative; normal mood and affect      Labs:  Results for orders placed or performed in visit on 10/24/16  Microscopic Examination  Result Value Ref Range   WBC, UA 11-30 (A) 0 - 5 /hpf   RBC, UA 0-2 0 - 2 /hpf   Epithelial Cells (non renal) 0-10 0 - 10 /hpf  Casts None seen None seen /lpf   Mucus, UA Present Not Estab.   Bacteria, UA Few None seen/Few  CBC with Differential/Platelet  Result Value Ref Range   WBC 6.4 3.4 - 10.8 x10E3/uL   RBC 4.15 3.77 - 5.28 x10E6/uL   Hemoglobin 12.4 11.1 - 15.9 g/dL   Hematocrit 39.4 34.0 - 46.6 %   MCV 95 79 - 97 fL   MCH 29.9 26.6 - 33.0 pg   MCHC 31.5 31.5 - 35.7 g/dL   RDW 14.0 12.3 - 15.4 %   Platelets 255 150 - 379 x10E3/uL   Neutrophils 58 Not Estab. %   Lymphs 29 Not Estab. %   Monocytes 9 Not Estab. %   Eos 3 Not Estab. %   Basos 1 Not Estab. %   Neutrophils Absolute 3.8 1.4 - 7.0 x10E3/uL   Lymphocytes Absolute 1.8 0.7 - 3.1 x10E3/uL   Monocytes Absolute 0.6 0.1 - 0.9 x10E3/uL   EOS (ABSOLUTE) 0.2 0.0 - 0.4 x10E3/uL   Basophils Absolute 0.1 0.0 - 0.2 x10E3/uL   Immature Granulocytes 0 Not Estab. %   Immature Grans (Abs) 0.0 0.0 - 0.1 x10E3/uL    Comprehensive metabolic panel  Result Value Ref Range   Glucose 87 65 - 99 mg/dL   BUN 15 8 - 27 mg/dL   Creatinine, Ser 0.55 (L) 0.57 - 1.00 mg/dL   GFR calc non Af Amer 95 >59 mL/min/1.73   GFR calc Af Amer 109 >59 mL/min/1.73   BUN/Creatinine Ratio 27 12 - 28   Sodium 142 134 - 144 mmol/L   Potassium 4.6 3.5 - 5.2 mmol/L   Chloride 99 96 - 106 mmol/L   CO2 30 (H) 18 - 29 mmol/L   Calcium 9.5 8.7 - 10.3 mg/dL   Total Protein 7.6 6.0 - 8.5 g/dL   Albumin 4.6 3.5 - 4.8 g/dL   Globulin, Total 3.0 1.5 - 4.5 g/dL   Albumin/Globulin Ratio 1.5 1.2 - 2.2   Bilirubin Total 0.7 0.0 - 1.2 mg/dL   Alkaline Phosphatase 102 39 - 117 IU/L   AST 20 0 - 40 IU/L   ALT 15 0 - 32 IU/L  Urinalysis, Complete  Result Value Ref Range   Specific Gravity, UA 1.020 1.005 - 1.030   pH, UA 6.5 5.0 - 7.5   Color, UA Yellow Yellow   Appearance Ur Clear Clear   Leukocytes, UA 2+ (A) Negative   Protein, UA Negative Negative/Trace   Glucose, UA Negative Negative   Ketones, UA Negative Negative   RBC, UA Negative Negative   Bilirubin, UA Negative Negative   Urobilinogen, Ur 0.2 0.2 - 1.0 mg/dL   Nitrite, UA Positive (A) Negative   Microscopic Examination See below:   Lipid Panel w/o Chol/HDL Ratio  Result Value Ref Range   Cholesterol, Total 166 100 - 199 mg/dL   Triglycerides 237 (H) 0 - 149 mg/dL   HDL 32 (L) >39 mg/dL   VLDL Cholesterol Cal 47 (H) 5 - 40 mg/dL   LDL Calculated 87 0 - 99 mg/dL  Hgb A1c w/o eAG  Result Value Ref Range   Hgb A1c MFr Bld 5.7 (H) 4.8 - 5.6 %  TSH  Result Value Ref Range   TSH 3.010 0.450 - 4.500 uIU/mL    Labs Reviewed - No data to display  No results found.     ASSESSMENT & PLAN:  1. Tinea corporis     Meds ordered this encounter  Medications  . ketoconazole (  NIZORAL) 2 % cream    Sig: Apply 1 application topically 2 (two) times daily.    Dispense:  60 g    Refill:  0    Reviewed expectations re: course of current medical issues. Questions  answered. Outlined signs and symptoms indicating need for more acute intervention. Patient verbalized understanding. After Visit Summary given.    Procedures:      Robyn Haber, MD 05/13/17 1303

## 2017-05-13 NOTE — Discharge Instructions (Signed)
Come back if not improving in 3-4 days

## 2017-05-13 NOTE — ED Triage Notes (Signed)
Pt c/o rash in between her thighs for several days, tried itch cream without relief.

## 2017-05-15 ENCOUNTER — Emergency Department (HOSPITAL_COMMUNITY)
Admission: EM | Admit: 2017-05-15 | Discharge: 2017-05-15 | Disposition: A | Discharge status: Home / Self Care | Payer: Medicare Other | Attending: Emergency Medicine | Admitting: Emergency Medicine

## 2017-05-15 ENCOUNTER — Encounter (HOSPITAL_COMMUNITY): Payer: Self-pay | Admitting: Emergency Medicine

## 2017-05-15 ENCOUNTER — Other Ambulatory Visit: Payer: Self-pay

## 2017-05-15 DIAGNOSIS — R21 Rash and other nonspecific skin eruption: Secondary | ICD-10-CM | POA: Insufficient documentation

## 2017-05-15 DIAGNOSIS — Z5321 Procedure and treatment not carried out due to patient leaving prior to being seen by health care provider: Secondary | ICD-10-CM | POA: Insufficient documentation

## 2017-05-15 NOTE — ED Notes (Signed)
Pt to the nurse's desk and stated she made an appointment to see her PCP tomorrow.

## 2017-05-15 NOTE — ED Triage Notes (Signed)
Pt reports being diagnosed with pin worms in her skin on her inner thighs. States the rash has been there for 3 weeks and any creams she has used has not worked.

## 2017-05-16 ENCOUNTER — Encounter (HOSPITAL_COMMUNITY): Payer: Self-pay | Admitting: Emergency Medicine

## 2017-05-16 ENCOUNTER — Ambulatory Visit (HOSPITAL_COMMUNITY)
Admission: EM | Admit: 2017-05-16 | Discharge: 2017-05-16 | Disposition: A | Discharge status: Home / Self Care | Payer: Medicare Other | Attending: Family Medicine | Admitting: Family Medicine

## 2017-05-16 ENCOUNTER — Other Ambulatory Visit: Payer: Self-pay

## 2017-05-16 DIAGNOSIS — R21 Rash and other nonspecific skin eruption: Secondary | ICD-10-CM | POA: Diagnosis not present

## 2017-05-16 MED ORDER — PREDNISONE 20 MG PO TABS: 40.0000 mg | ORAL_TABLET | Freq: Every day | ORAL | 0 refills | Status: AC

## 2017-05-16 NOTE — ED Provider Notes (Signed)
[facility]    CSN: [id number] Arrival date & time: 05/16/17  1027     History   Chief Complaint Chief Complaint  Patient presents with  . Follow-up    HPI Lauren [name] is a 71 y.o. female presenting for follow up of a rash on bilateral inner thighs. Rash has persisted for 3 weeks, associated with itching and burning. Seen here on 12/23 and given ketoconazole cream for rash, cream has not provided any relief of itching sensation. States rash has started to spread closer to knees, in popliteal area and closer to groin. Tried epsom salt baths and triamcinolone also without relief.   HPI  Past Medical History:  Diagnosis Date  . Anxiety   . Arthritis   . Carotid stenosis 10/08/2013  . Cervical disc disease   . Depression   . Diverticulitis   . Diverticulosis   . Frequent headaches   . Hypertension   . Hypothyroidism 10/26/2015  . Impaired glucose tolerance 10/08/2013   normal HgA1C  . Neck pain   . Psoriasis     Patient Active Problem List   Diagnosis Date Noted  . Hypothyroidism 10/26/2015  . Peripheral edema 10/22/2015  . Cervical dystonia 07/14/2015  . Weakness generalized 11/16/2014  . Insomnia 11/16/2014  . S/P cervical spinal fusion 11/05/2014  . Preop exam for internal medicine 10/14/2014  . Essential hypertension 10/14/2014  . Fracture, cervical vertebra ([place]) 08/30/2014  . Preventative health care 10/08/2013  . Impaired glucose tolerance 10/08/2013  . Carotid stenosis 10/08/2013  . Depression   . Diverticulitis   . Anxiety   . Psoriasis   . Cervical disc disease   . Diverticulitis of colon (without mention of hemorrhage)(562.11) 05/10/2013    Past Surgical History:  Procedure Laterality Date  . BREAST BIOPSY Left 2011  . CERVICAL FUSION     approx 1980  . CHOLECYSTECTOMY    . POSTERIOR CERVICAL FUSION/FORAMINOTOMY N/A 11/05/2014   Procedure: Cervical four to Cervical seven  Posterior Cervical Fusion with lateral mass fixation;   Surgeon: Eustace Moore, MD;  Location: [facility];  Service: Neurosurgery;  Laterality: N/A;   C4 - C7 Posterior Cervical Fusion with lateral mass fixation  . TOTAL ABDOMINAL HYSTERECTOMY  1998   Fibroid    OB History    Gravida Para Term Preterm AB Living   2 2 2  0 0 2   SAB TAB Ectopic Multiple Live Births   0 0 0 0 2       Home Medications    Prior to Admission medications   Medication Sig Start Date End Date Taking? Authorizing Provider  amLODipine (NORVASC) 5 MG tablet TAKE 1 TABLET BY MOUTH  DAILY 12/28/16   Biagio Borg, MD  aspirin EC 325 MG tablet Take 325 mg by mouth daily.    [provider]  calcium carbonate (OS-CAL) 1250 (500 CA) MG chewable tablet Chew 2 tablets by mouth daily.    [provider]  cholecalciferol (VITAMIN D) 1000 units tablet Take 1,000 Units by mouth daily.    [provider]  escitalopram (LEXAPRO) 10 MG tablet TAKE 1 TABLET BY MOUTH  DAILY 02/07/17   Biagio Borg, MD  hydrochlorothiazide (MICROZIDE) 12.5 MG capsule TAKE 1 CAPSULE BY MOUTH  DAILY 12/28/16   Biagio Borg, MD  ketoconazole (NIZORAL) 2 % cream Apply 1 application topically 2 (two) times daily. 05/13/17   Robyn Haber, MD  levothyroxine (SYNTHROID, LEVOTHROID) 50 MCG tablet TAKE 1  TABLET BY MOUTH  DAILY 04/19/17   Biagio Borg, MD  polyethylene glycol [facility] / Floria Raveling) packet Take 17 g by mouth daily.    [provider]  triamcinolone (KENALOG) 0.025 % ointment Apply 1 application topically 2 (two) times daily. 10/23/16   Kem Boroughs, FNP  zolpidem (AMBIEN) 5 MG tablet Take 1 tablet (5 mg total) by mouth at bedtime as needed. for sleep 10/22/15   Biagio Borg, MD    Family History Family History  Problem Relation Age of Onset  . Heart disease Mother   . Diabetes Mother   . Cervical cancer Mother   . Heart attack Mother   . Lung cancer Father   . Alcoholism Father   . Diabetes Brother   . Throat cancer Brother   . Cancer Other         lung cancer  . Heart disease Other   . Hypertension Other   . Arthritis Other   . Alcohol abuse Other     Social History Social History   Tobacco Use  . Smoking status: Never Smoker  . Smokeless tobacco: Never Used  Substance Use Topics  . Alcohol use: No    Alcohol/week: 0.0 oz  . Drug use: No     Allergies   Celebrex [celecoxib]   Review of Systems Review of Systems  Constitutional: Negative for fatigue and fever.  Respiratory: Negative for shortness of breath.   Cardiovascular: Negative for leg swelling.  Gastrointestinal: Negative for abdominal pain, diarrhea, nausea and vomiting.  Skin: Positive for color change and rash.  Neurological: Positive for headaches. Negative for dizziness and light-headedness.  All other systems reviewed and are negative.    Physical Exam Triage Vital Signs ED Triage Vitals  Enc Vitals Group     BP 05/16/17 1100 128/75     Pulse Rate 05/16/17 1100 72     Resp 05/16/17 1100 18     Temp 05/16/17 1100 98 F (36.7 C)     Temp Source 05/16/17 1100 Oral     SpO2 05/16/17 1100 96 %     Weight --      Height --      Head Circumference --      Peak Flow --      Pain Score 05/16/17 1058 9     Pain Loc --      Pain Edu? --      Excl. in [place]? --    No data found.  Updated Vital Signs BP 128/75 (BP Location: Right Arm)   Pulse 72   Temp 98 F (36.7 C) (Oral)   Resp 18   LMP 05/22/1996   SpO2 96%    Physical Exam  Constitutional: She appears well-developed and well-nourished.  Mildly uncomfortable due to itching, avoiding scrathing at all costs.  Skin: Skin is warm. Rash noted. There is erythema.  Erythematous areas on bilateral medial thighs, small erythematous papules extending sporadically towards knee, erythema in popliteal area bilaterally.   Nursing note and vitals reviewed.       UC Treatments / Results  Labs (all labs ordered are listed, but only abnormal results are displayed) Labs Reviewed - No data to  display  EKG  EKG Interpretation None       Radiology No results found.  Procedures Procedures (including critical care time)  Medications Ordered in UC Medications - No data to display   Initial Impression / Assessment and Plan / UC Course  I have reviewed  the triage vital signs and the nursing notes.  Pertinent labs & imaging results that were available during my care of the patient were reviewed by me and considered in my medical decision making (see chart for details).    Rash of unknown origin, patient mainly concerned about persistent itching/burning without relief- continue ketoconazole and triamcinolone, oral prednisone 40 mg daily x 3 days. Continue to monitor spreading of rash.  Discussed return precautions. Patient verbalized understanding and is agreeable with plan.   Final Clinical Impressions(s) / UC Diagnoses   Final diagnoses:  None    ED Discharge Orders    None       Controlled Substance Prescriptions [place] Controlled Substance Registry consulted? Not Applicable   Janith Lima, [place] 05/16/17 1158

## 2017-05-16 NOTE — Discharge Instructions (Signed)
Please take oral prednisone daily for 3 days.  Continue to apply the ketoconazole (fungal) cream and triamcinolone (steroid cream) to affected area twice daily. Please be diligent about this.   Continue to monitor spread. Please return if symptoms are not relieved and persist.

## 2017-05-16 NOTE — ED Triage Notes (Signed)
Patient says she was seen at [facility] on 12[facility]  Patient says there is a rash and it is spreading, itching and burning.  Patient went to ed yesterday, but did not stay.  Patient has not called pcp

## 2017-05-21 ENCOUNTER — Telehealth: Payer: Self-pay | Admitting: Certified Nurse Midwife

## 2017-05-21 NOTE — Telephone Encounter (Signed)
If not gyn related see PCP or dermatology. Aveeno sitz bath for comfort.

## 2017-05-21 NOTE — Telephone Encounter (Signed)
Spoke with patient. Patient states she has been treated for "baby pinworms", seen in ED on 12/23 and for f/u at urgent care on 12/26.   Patient states the itching inside her thighs has increased, has completed oral steroid and still using topical cream. Does not feel cream is providing relief.   Patient denies any GYN symptoms.   Recommended patient f/u with PCP for further evaluation, patient declined. Patient requesting to see a female provider. Advised patient will review with Melvia Heaps, CNM and return call with any additional recommendations, patient agreeable.   Melvia Heaps, CNM -please review? Any additional recommendations?

## 2017-05-21 NOTE — Telephone Encounter (Signed)
Patient states she was seen at [facility] urgent care and was diagnosed with pinworms on her thighs.  Patient states that she was given medication to take for 3 days for itching and burning and it helped.  She states the itching and burning is back.

## 2017-05-21 NOTE — Telephone Encounter (Signed)
Spoke with patient, advised evalution recommended with PCP or dermatology. Patient verbalizes understanding. Call disconnected by patient before advising on Aveeno.   Routing to provider for final review.  Will close encounter.

## 2017-05-24 ENCOUNTER — Encounter: Payer: Self-pay | Admitting: Internal Medicine

## 2017-05-24 ENCOUNTER — Ambulatory Visit (INDEPENDENT_AMBULATORY_CARE_PROVIDER_SITE_OTHER): Payer: Medicare Other | Admitting: Internal Medicine

## 2017-05-24 VITALS — BP 116/74 | HR 93 | Ht 64.0 in | Wt 156.0 lb

## 2017-05-24 DIAGNOSIS — G4700 Insomnia, unspecified: Secondary | ICD-10-CM

## 2017-05-24 DIAGNOSIS — E669 Obesity, unspecified: Secondary | ICD-10-CM | POA: Diagnosis not present

## 2017-05-24 DIAGNOSIS — I10 Essential (primary) hypertension: Secondary | ICD-10-CM | POA: Diagnosis not present

## 2017-05-24 DIAGNOSIS — R21 Rash and other nonspecific skin eruption: Secondary | ICD-10-CM | POA: Diagnosis not present

## 2017-05-24 MED ORDER — CLOTRIMAZOLE-BETAMETHASONE 1-0.05 % EX CREA: TOPICAL_CREAM | CUTANEOUS | 1 refills | Status: DC

## 2017-05-24 MED ORDER — PHENTERMINE HCL 30 MG PO CAPS
30.0000 mg | ORAL_CAPSULE | ORAL | 2 refills | Status: DC
Start: 2017-05-24 — End: 2018-12-23

## 2017-05-24 MED ORDER — ZOLPIDEM TARTRATE 5 MG PO TABS: 5.0000 mg | ORAL_TABLET | Freq: Every evening | ORAL | 2 refills | Status: DC | PRN

## 2017-05-24 NOTE — Assessment & Plan Note (Signed)
Pt has kissing bilat tender rash areas to medial thighs not responding to current tx and no fever or other evidence for cellulitis; appears to be simple chafing likely assoc with wt gain and tight clothing.  Pt educated, tx empirically with lotrisone asd, but most likely to improve with wt loss if possible, less walking and standing if possible for a short time, and looser clothing, and even simple protective gauze pads to areas secured by taping until improved with reduced friction

## 2017-05-24 NOTE — Assessment & Plan Note (Signed)
Mild, for ambien qhs prn,  to f/u any worsening symptoms or concerns 

## 2017-05-24 NOTE — Patient Instructions (Addendum)
Please take all new medication as prescribed - the cream  Ok to use Gauze pads or any other protective barrier (even bandaids) to assist with the chafing protection  Please avoid tight clothing  Please take all new medication as prescribed - the weight loss pill (we can only do 3 mo of this)  Please continue all other medications as before, and refills have been done if requested - the ambien  Please have the pharmacy call with any other refills you may need.  Please keep your appointments with your specialists as you may have planned

## 2017-05-24 NOTE — Assessment & Plan Note (Signed)
stable overall by history and exam, recent data reviewed with pt, and pt to continue medical treatment as before,  to f/u any worsening symptoms or concerns BP Readings from Last 3 Encounters:  05/24/17 116/74  05/16/17 128/75  05/15/17 126/62

## 2017-05-24 NOTE — Assessment & Plan Note (Signed)
Encouraged wt loss, pt requests phentermine asd,  to f/u any worsening symptoms or concerns

## 2017-05-24 NOTE — Progress Notes (Signed)
Subjective:    Patient ID: Lauren [name], female    DOB: [birth year], 72 y.o.   MRN: [id number]  HPI  Here with c/o 1 month better then worse then better again erythematous tender areas in kissing fashion bilat medial mid to upper thigh, after recent wt gain.  Admits to legs rubbing with walking and maybe clothing being involved.  Has seen UC and dx with "pin worms"  But not better with steroid tx or antifungal cream.  No fever or ulcers but did have blistering at one point very painful until improved.  Pt denies chest pain, increased sob or doe, wheezing, orthopnea, PND, increased LE swelling, palpitations, dizziness or syncope.  Also has c/o worsening sleep difficulty not assoc with thigh pain, Denies worsening depressive symptoms, suicidal ideation, or panic Past Medical History:  Diagnosis Date  . Anxiety   . Arthritis   . Carotid stenosis 10/08/2013  . Cervical disc disease   . Depression   . Diverticulitis   . Diverticulosis   . Frequent headaches   . Hypertension   . Hypothyroidism 10/26/2015  . Impaired glucose tolerance 10/08/2013   normal HgA1C  . Neck pain   . Psoriasis    Past Surgical History:  Procedure Laterality Date  . BREAST BIOPSY Left 2011  . CERVICAL FUSION     approx 1980  . CHOLECYSTECTOMY    . POSTERIOR CERVICAL FUSION/FORAMINOTOMY N/A 11/05/2014   Procedure: Cervical four to Cervical seven  Posterior Cervical Fusion with lateral mass fixation;  Surgeon: Eustace Moore, MD;  Location: [place] [facility];  Service: Neurosurgery;  Laterality: N/A;   C4 - C7 Posterior Cervical Fusion with lateral mass fixation  . TOTAL ABDOMINAL HYSTERECTOMY  1998   Fibroid    reports that  has never smoked. she has never used smokeless tobacco. She reports that she does not drink alcohol or use drugs. family history includes Alcohol abuse in her other; Alcoholism in her father; Arthritis in her other; Cancer in her other; Cervical cancer in her mother; Diabetes in her brother and  mother; Heart attack in her mother; Heart disease in her mother and other; Hypertension in her other; Lung cancer in her father; Throat cancer in her brother. Allergies  Allergen Reactions  . Celebrex [Celecoxib] Itching and Rash   Current Outpatient Medications on File Prior to Visit  Medication Sig Dispense Refill  . amLODipine (NORVASC) 5 MG tablet TAKE 1 TABLET BY MOUTH  DAILY 90 tablet 2  . aspirin EC 325 MG tablet Take 325 mg by mouth daily.    . calcium carbonate (OS-CAL) 1250 (500 CA) MG chewable tablet Chew 2 tablets by mouth daily.    . cholecalciferol (VITAMIN D) 1000 units tablet Take 1,000 Units by mouth daily.    Marland Kitchen escitalopram (LEXAPRO) 10 MG tablet TAKE 1 TABLET BY MOUTH  DAILY 90 tablet 2  . hydrochlorothiazide (MICROZIDE) 12.5 MG capsule TAKE 1 CAPSULE BY MOUTH  DAILY 90 capsule 2  . levothyroxine (SYNTHROID, LEVOTHROID) 50 MCG tablet TAKE 1 TABLET BY MOUTH  DAILY 90 tablet 1  . polyethylene glycol (MIRALAX / GLYCOLAX) packet Take 17 g by mouth daily.     No current facility-administered medications on file prior to visit.    Review of Systems  Constitutional: Negative for other unusual diaphoresis or sweats HENT: Negative for ear discharge or swelling Eyes: Negative for other worsening visual disturbances Respiratory: Negative for stridor or other swelling  Gastrointestinal: Negative for worsening distension or other blood  Genitourinary: Negative for retention or other urinary change Musculoskeletal: Negative for other MSK pain or swelling Skin: Negative for color change or other new lesions Neurological: Negative for worsening tremors and other numbness  Psychiatric/Behavioral: Negative for worsening agitation or other fatigue All other system neg per pt    Objective:   Physical Exam BP 116/74   Pulse 93   Ht 5\' 4"  (1.626 m)   Wt 156 lb (70.8 kg)   LMP 05/22/1996   SpO2 95%   BMI 26.78 kg/m  VS noted,  Constitutional: Pt appears in NAD HENT: Head: NCAT.    Right Ear: External ear normal.  Left Ear: External ear normal.  Eyes: . Pupils are equal, round, and reactive to light. Conjunctivae and EOM are normal Nose: without d/c or deformity Neck: Neck supple. Gross normal ROM Cardiovascular: Normal rate and regular rhythm.   Pulmonary/Chest: Effort normal and breath sounds without rales or wheezing.  Neurological: Pt is alert. At baseline orientation, motor grossly intact Skin: Skin is warm. + mild tender slightly raised erythem rash areas to medial thighs right > left without current blistering or ulcers, no other new lesions, no LE edema Psychiatric: Pt behavior is normal without agitation  No other exam findings       Assessment & Plan:

## 2017-06-07 ENCOUNTER — Telehealth: Payer: Self-pay | Admitting: Internal Medicine

## 2017-06-07 NOTE — Telephone Encounter (Signed)
Pt would like this sent to [facility]

## 2017-06-07 NOTE — Telephone Encounter (Signed)
Routing back to sam/sched---only [facility] pharmacy has injections--can you call patient back and make sure they are ok with going there, thanks

## 2017-06-07 NOTE — Telephone Encounter (Signed)
Routing back to sam/sched---can you tell patient she will need to get injection at her pharmacy, if she is medicare primary and not working, Information systems manager will cover cost of injection if she gets at pharmacy, thanks

## 2017-06-07 NOTE — Telephone Encounter (Signed)
Pt would like to be put on waitlist for Shingrix

## 2017-07-03 MED FILL — SHINGRIX VIAL KIT: 50 | 1 days supply | Qty: 1 | Fill #0

## 2017-07-09 ENCOUNTER — Other Ambulatory Visit: Payer: Self-pay | Admitting: Internal Medicine

## 2017-07-26 ENCOUNTER — Other Ambulatory Visit: Payer: Self-pay | Admitting: Internal Medicine

## 2017-09-08 ENCOUNTER — Other Ambulatory Visit: Payer: Self-pay | Admitting: Internal Medicine

## 2017-09-13 ENCOUNTER — Other Ambulatory Visit: Payer: Self-pay | Admitting: Internal Medicine

## 2017-10-24 ENCOUNTER — Ambulatory Visit (INDEPENDENT_AMBULATORY_CARE_PROVIDER_SITE_OTHER): Payer: Medicare Other | Admitting: Certified Nurse Midwife

## 2017-10-24 ENCOUNTER — Other Ambulatory Visit: Payer: Self-pay

## 2017-10-24 ENCOUNTER — Encounter: Payer: Self-pay | Admitting: Certified Nurse Midwife

## 2017-10-24 ENCOUNTER — Ambulatory Visit: Payer: Medicare Other | Admitting: Nurse Practitioner

## 2017-10-24 VITALS — BP 110/60 | HR 68 | Resp 16 | Ht 64.25 in | Wt 155.0 lb

## 2017-10-24 DIAGNOSIS — R239 Unspecified skin changes: Secondary | ICD-10-CM | POA: Diagnosis not present

## 2017-10-24 DIAGNOSIS — K629 Disease of anus and rectum, unspecified: Secondary | ICD-10-CM | POA: Diagnosis not present

## 2017-10-24 DIAGNOSIS — L900 Lichen sclerosus et atrophicus: Secondary | ICD-10-CM | POA: Diagnosis not present

## 2017-10-24 DIAGNOSIS — Z780 Asymptomatic menopausal state: Secondary | ICD-10-CM

## 2017-10-24 DIAGNOSIS — Z01411 Encounter for gynecological examination (general) (routine) with abnormal findings: Secondary | ICD-10-CM

## 2017-10-24 NOTE — Patient Instructions (Signed)
EXERCISE AND DIET:  We recommended that you start or continue a regular exercise program for good health. Regular exercise means any activity that makes your heart beat faster and makes you sweat.  We recommend exercising at least 30 minutes per day at least 3 days a week, preferably 4 or 5.  We also recommend a diet low in fat and sugar.  Inactivity, poor dietary choices and obesity can cause diabetes, heart attack, stroke, and kidney damage, among others.    ALCOHOL AND SMOKING:  Women should limit their alcohol intake to no more than 7 drinks/beers/glasses of wine (combined, not each!) per week. Moderation of alcohol intake to this level decreases your risk of breast cancer and liver damage. And of course, no recreational drugs are part of a healthy lifestyle.  And absolutely no smoking or even second hand smoke. Most people know smoking can cause heart and lung diseases, but did you know it also contributes to weakening of your bones? Aging of your skin?  Yellowing of your teeth and nails?  CALCIUM AND VITAMIN D:  Adequate intake of calcium and Vitamin D are recommended.  The recommendations for exact amounts of these supplements seem to change often, but generally speaking 600 mg of calcium (either carbonate or citrate) and 800 units of Vitamin D per day seems prudent. Certain women may benefit from higher intake of Vitamin D.  If you are among these women, your doctor will have told you during your visit.    PAP SMEARS:  Pap smears, to check for cervical cancer or precancers,  have traditionally been done yearly, although recent scientific advances have shown that most women can have pap smears less often.  However, every woman still should have a physical exam from her gynecologist every year. It will include a breast check, inspection of the vulva and vagina to check for abnormal growths or skin changes, a visual exam of the cervix, and then an exam to evaluate the size and shape of the uterus and  ovaries.  And after 73 years of age, a rectal exam is indicated to check for rectal cancers. We will also provide age appropriate advice regarding health maintenance, like when you should have certain vaccines, screening for sexually transmitted diseases, bone density testing, colonoscopy, mammograms, etc.   MAMMOGRAMS:  All women over 40 years old should have a yearly mammogram. Many facilities now offer a "3D" mammogram, which may cost around $50 extra out of pocket. If possible,  we recommend you accept the option to have the 3D mammogram performed.  It both reduces the number of women who will be called back for extra views which then turn out to be normal, and it is better than the routine mammogram at detecting truly abnormal areas.    COLONOSCOPY:  Colonoscopy to screen for colon cancer is recommended for all women at age 50.  We know, you hate the idea of the prep.  We agree, BUT, having colon cancer and not knowing it is worse!!  Colon cancer so often starts as a polyp that can be seen and removed at colonscopy, which can quite literally save your life!  And if your first colonoscopy is normal and you have no family history of colon cancer, most women don't have to have it again for 10 years.  Once every ten years, you can do something that may end up saving your life, right?  We will be happy to help you get it scheduled when you are ready.    Be sure to check your insurance coverage so you understand how much it will cost.  It may be covered as a preventative service at no cost, but you should check your particular policy.      Lichen Sclerosus Lichen sclerosus is a skin problem. It can happen on any part of the body. It happens most often in the anal or genital areas. It can cause itching and discomfort. Treatment can help to control symptoms. This skin problem is not passed from one person to another (not contagious). The cause is not known. Follow these instructions at home:  Take  over-the-counter and prescription medicines only as told by your doctor.  Use creams or ointments as told by your doctor.  Do not scratch the affected areas of skin.  Women should keep the vagina as clean and dry as they can.  Keep all follow-up visits as told by your doctor. This is important. Contact a doctor if:  Your redness, swelling, or pain gets worse.  You have fluid, blood, or pus coming from the area.  You have new patches (lesions) on your skin.  You have a fever.  You have pain during sex. This information is not intended to replace advice given to you by your health care provider. Make sure you discuss any questions you have with your health care provider. Document Released: 04/20/2008 Document Revised: 10/14/2015 Document Reviewed: 08/03/2014 Elsevier Interactive Patient Education  [employer].

## 2017-10-24 NOTE — Progress Notes (Signed)
72 y.o. G23P2002 Married  Caucasian Fe here for annual exam. Post menopausal no vaginal bleeding. Some vaginal dryness using OTC cream as needed. Discouraged with weight loss, but continues to work on . PCP gave her trial of phentermine and found no change. Sees PCP Dr. Jenny Reichmann for labs, aex, medication management of hypertension,hypothyroid, anxiety and depression, vitamin D if needed. All stable, no changes per patient.Has noted area in anal area she wants checked due to skin appearance change. She had spouse look at too. Had used medication for Lichen Sclerosis in past but very infrequent, only if itching. Has noted no itching in this area or blood in stool. No other health issues today.  Patient's last menstrual period was 05/22/1996.          Sexually active: Yes.    The current method of family planning is status post hysterectomy.    Exercising: Yes.    walking Smoker:  no  Health Maintenance: Pap:  03/17/09 neg History of Abnormal Pap: no MMG:  04-23-17 category c density birads 1:neg Self Breast exams: yes Colonoscopy:  2011 f/u 66yrs BMD:   2018 normal TDaP:  2018 Shingles: 2019 Pneumonia: 2015 Hep C and HIV: Hep c neg 2017 Labs: PCP   reports that she has never smoked. She has never used smokeless tobacco. She reports that she drinks alcohol. She reports that she does not use drugs.  Past Medical History:  Diagnosis Date  . Anxiety   . Arthritis   . Carotid stenosis 10/08/2013  . Cervical disc disease   . Depression   . Diverticulitis   . Diverticulosis   . Frequent headaches   . Hypertension   . Hypothyroidism 10/26/2015  . Impaired glucose tolerance 10/08/2013   normal HgA1C  . Neck pain   . Psoriasis     Past Surgical History:  Procedure Laterality Date  . BREAST BIOPSY Left 2011  . CERVICAL FUSION     approx 1980  . CHOLECYSTECTOMY    . POSTERIOR CERVICAL FUSION/FORAMINOTOMY N/A 11/05/2014   Procedure: Cervical four to Cervical seven  Posterior Cervical Fusion  with lateral mass fixation;  Surgeon: Eustace Moore, MD;  Location: [facility];  Service: Neurosurgery;  Laterality: N/A;   C4 - C7 Posterior Cervical Fusion with lateral mass fixation  . TOTAL ABDOMINAL HYSTERECTOMY  1998   Fibroid    Current Outpatient Medications  Medication Sig Dispense Refill  . amLODipine (NORVASC) 5 MG tablet TAKE 1 TABLET BY MOUTH  DAILY 90 tablet 0  . aspirin EC 325 MG tablet Take 325 mg by mouth daily.    . calcium carbonate (OS-CAL) 1250 (500 CA) MG chewable tablet Chew 2 tablets by mouth daily.    . cholecalciferol (VITAMIN D) 1000 units tablet Take 1,000 Units by mouth daily.    . clotrimazole-betamethasone (LOTRISONE) cream Use as directed twice daily as needed to affected area 15 g 1  . escitalopram (LEXAPRO) 10 MG tablet TAKE 1 TABLET BY MOUTH  DAILY 90 tablet 2  . hydrochlorothiazide (MICROZIDE) 12.5 MG capsule TAKE 1 CAPSULE BY MOUTH  DAILY 90 capsule 1  . levothyroxine (SYNTHROID, LEVOTHROID) 50 MCG tablet TAKE 1 TABLET BY MOUTH  DAILY 90 tablet 0  . phentermine 30 MG capsule Take 1 capsule (30 mg total) by mouth every morning. 30 capsule 2  . polyethylene glycol (MIRALAX / GLYCOLAX) packet Take 17 g by mouth daily.    Marland Kitchen triamcinolone (KENALOG) 0.025 % ointment     . zolpidem (  AMBIEN) 5 MG tablet Take 1 tablet (5 mg total) by mouth at bedtime as needed. for sleep 30 tablet 2   No current facility-administered medications for this visit.     Family History  Problem Relation Age of Onset  . Heart disease Mother   . Diabetes Mother   . Cervical cancer Mother   . Heart attack Mother   . Lung cancer Father   . Alcoholism Father   . Diabetes Brother   . Throat cancer Brother   . Cancer Other        lung cancer  . Heart disease Other   . Hypertension Other   . Arthritis Other   . Alcohol abuse Other     ROS:  Pertinent items are noted in HPI.  Otherwise, a comprehensive ROS was negative.  Exam:   BP 110/60   Pulse 68   Resp 16   Ht 5'  4.25" (1.632 m)   Wt 155 lb (70.3 kg)   LMP 05/22/1996   BMI 26.40 kg/m  Height: 5' 4.25" (163.2 cm) Ht Readings from Last 3 Encounters:  10/24/17 5' 4.25" (1.632 m)  05/24/17 5\' 4"  (1.626 m)  05/15/17 5\' 4"  (1.626 m)    General appearance: alert, cooperative and appears stated age Head: Normocephalic, without obvious abnormality, atraumatic Neck: no adenopathy, supple, symmetrical, trachea midline and thyroid normal to inspection and palpation Lungs: clear to auscultation bilaterally Breasts: normal appearance, no masses or tenderness, No nipple retraction or dimpling, No nipple discharge or bleeding, No axillary or supraclavicular adenopathy Heart: regular rate and rhythm Abdomen: soft, non-tender; no masses,  no organomegaly Extremities: extremities normal, atraumatic, no cyanosis or edema Skin: Skin color, texture, turgor normal. No rashes or lesions Lymph nodes: Cervical, supraclavicular, and axillary nodes normal. No abnormal inguinal nodes palpated Neurologic: Grossly normal   Pelvic: External genitalia:  no lesions              Urethra:  normal appearing urethra with no masses, tenderness or lesions              Bartholin's and Skene's: normal                 Vagina: normal appearing vagina with normal color and discharge, no lesions              Cervix: absent              Pap taken: No. Bimanual Exam:  Uterus:  uterus absent              Adnexa: no mass, fullness, tenderness and adnexal surgically absent               Rectovaginal: Confirms               Anus:  normal sphincter tone, no lesions  Hemorrhoidal appearing tag with raised pigmented changes on left anal opening with magnifying glass use.Non tender, but definitely raised and darker pigment noted. Shown to patient and this was her area of concern.  Chaperone present: yes  A:  Well Woman with normal exam  Post menopausal no HRT  Lichen Sclerosis history with previous medication use no flares per patient  recently  Anal lesion vs hemorrhoidal tag with raised pigmented area  Hypothyroid, Depression/anxiety,hypertension management with MD    P:   Reviewed health and wellness pertinent to exam  Discussed importance of vaginal moisture to decrease risk of UTI and vaginal discomfort. Discussed OTC options with instructions  Discussed anal finding and will need evaluation for possible biopsy by MD. Patient agreeable. She will be called with insurance information and scheduled  Continue follow up with MD as indicated  Pap smear: no  counseled on breast self exam, mammography screening, feminine hygiene, adequate intake of calcium and vitamin D, diet and exercise  return annually or prn  An After Visit Summary was printed and given to the patient.

## 2017-10-25 ENCOUNTER — Other Ambulatory Visit: Payer: Self-pay

## 2017-10-25 ENCOUNTER — Ambulatory Visit (INDEPENDENT_AMBULATORY_CARE_PROVIDER_SITE_OTHER): Payer: Medicare Other | Admitting: Internal Medicine

## 2017-10-25 ENCOUNTER — Encounter: Payer: Self-pay | Admitting: Internal Medicine

## 2017-10-25 VITALS — BP 130/82 | HR 80 | Temp 98.4°F | Ht 64.25 in | Wt 157.0 lb

## 2017-10-25 DIAGNOSIS — E559 Vitamin D deficiency, unspecified: Secondary | ICD-10-CM

## 2017-10-25 DIAGNOSIS — R7302 Impaired glucose tolerance (oral): Secondary | ICD-10-CM

## 2017-10-25 DIAGNOSIS — Z0000 Encounter for general adult medical examination without abnormal findings: Secondary | ICD-10-CM | POA: Diagnosis not present

## 2017-10-25 MED ORDER — ZOLPIDEM TARTRATE 5 MG PO TABS: 5.0000 mg | ORAL_TABLET | Freq: Every evening | ORAL | 1 refills | Status: DC | PRN

## 2017-10-25 MED ORDER — HYDROCHLOROTHIAZIDE 12.5 MG PO CAPS: 12.5000 mg | ORAL_CAPSULE | Freq: Every day | ORAL | 3 refills | Status: DC

## 2017-10-25 MED ORDER — LEVOTHYROXINE SODIUM 50 MCG PO TABS: 50.0000 ug | ORAL_TABLET | Freq: Every day | ORAL | 3 refills | Status: DC

## 2017-10-25 MED ORDER — ESCITALOPRAM OXALATE 10 MG PO TABS: 10.0000 mg | ORAL_TABLET | Freq: Every day | ORAL | 3 refills | Status: DC

## 2017-10-25 MED ORDER — AMLODIPINE BESYLATE 5 MG PO TABS: 5.0000 mg | ORAL_TABLET | Freq: Every day | ORAL | 3 refills | Status: DC

## 2017-10-25 NOTE — Patient Instructions (Signed)
Please continue all other medications as before, and refills have been done if requested.  Please have the pharmacy call with any other refills you may need.  Please continue your efforts at being more active, low cholesterol diet, and weight control.  You are otherwise up to date with prevention measures today.  Please keep your appointments with your specialists as you may have planned  Please go to the LAB at the [facility] the tests to be done at your convenience  You will be contacted by phone if any changes need to be made immediately.  Otherwise, you will receive a letter about your results with an explanation, but please check with MyChart first.  Please remember to sign up for MyChart if you have not done so, as this will be important to you in the future with finding out test results, communicating by private email, and scheduling acute appointments online when needed.  Please return in 1 year for your yearly visit, or sooner if needed

## 2017-10-25 NOTE — Progress Notes (Signed)
Subjective:    Patient ID: Lauren [name], female    DOB: June 27, 1945, 72 y.o.   MRN: [id number]  HPI  Here for wellness and f/u;  Overall doing ok;  Pt denies Chest pain, worsening SOB, DOE, wheezing, orthopnea, PND, worsening LE edema, palpitations, dizziness or syncope.  Pt denies neurological change such as new headache, facial or extremity weakness.  Pt denies polydipsia, polyuria, or low sugar symptoms. Pt states overall good compliance with treatment and medications, good tolerability, and has been trying to follow appropriate diet.  Pt denies worsening depressive symptoms, suicidal ideation or panic. No fever, night sweats, wt loss, loss of appetite, or other constitutional symptoms.  Pt states good ability with ADL's, has low fall risk, home safety reviewed and adequate, no other significant changes in hearing or vision, and only occasionally active with exercise. Does occas walking and yardwork.   Wt Readings from Last 3 Encounters:  10/25/17 157 lb (71.2 kg)  10/24/17 155 lb (70.3 kg)  05/24/17 156 lb (70.8 kg)   BP Readings from Last 3 Encounters:  10/25/17 130/82  10/24/17 110/60  05/24/17 116/74   Past Medical History:  Diagnosis Date  . Anxiety   . Arthritis   . Carotid stenosis 10/08/2013  . Cervical disc disease   . Depression   . Diverticulitis   . Diverticulosis   . Frequent headaches   . Hypertension   . Hypothyroidism 10/26/2015  . Impaired glucose tolerance 10/08/2013   normal HgA1C  . Neck pain   . Psoriasis    Past Surgical History:  Procedure Laterality Date  . BREAST BIOPSY Left 2011  . CERVICAL FUSION     approx 1980  . CHOLECYSTECTOMY    . POSTERIOR CERVICAL FUSION/FORAMINOTOMY N/A 11/05/2014   Procedure: Cervical four to Cervical seven  Posterior Cervical Fusion with lateral mass fixation;  Surgeon: Eustace Moore, MD;  Location: [facility];  Service: Neurosurgery;  Laterality: N/A;   C4 - C7 Posterior Cervical Fusion with lateral mass fixation  .  TOTAL ABDOMINAL HYSTERECTOMY  1998   Fibroid    reports that she has never smoked. She has never used smokeless tobacco. She reports that she drinks alcohol. She reports that she does not use drugs. family history includes Alcohol abuse in her other; Alcoholism in her father; Arthritis in her other; Cancer in her other; Cervical cancer in her mother; Diabetes in her brother and mother; Heart attack in her mother; Heart disease in her mother and other; Hypertension in her other; Lung cancer in her father; Throat cancer in her brother. Allergies  Allergen Reactions  . Celebrex [Celecoxib] Itching and Rash   , Current Outpatient Medications on File Prior to Visit  Medication Sig Dispense Refill  . aspirin EC 325 MG tablet Take 325 mg by mouth daily.    . calcium carbonate (OS-CAL) 1250 (500 CA) MG chewable tablet Chew 2 tablets by mouth daily.    . cholecalciferol (VITAMIN D) 1000 units tablet Take 1,000 Units by mouth daily.    . clotrimazole-betamethasone (LOTRISONE) cream Use as directed twice daily as needed to affected area 15 g 1  . phentermine 30 MG capsule Take 1 capsule (30 mg total) by mouth every morning. 30 capsule 2  . polyethylene glycol (MIRALAX / GLYCOLAX) packet Take 17 g by mouth daily.    Marland Kitchen triamcinolone (KENALOG) 0.025 % ointment      No current facility-administered medications on file prior to visit.    Review of  Systems Constitutional: Negative for other unusual diaphoresis, sweats, appetite or weight changes HENT: Negative for other worsening hearing loss, ear pain, facial swelling, mouth sores or neck stiffness.   Eyes: Negative for other worsening pain, redness or other visual disturbance.  Respiratory: Negative for other stridor or swelling Cardiovascular: Negative for other palpitations or other chest pain  Gastrointestinal: Negative for worsening diarrhea or loose stools, blood in stool, distention or other pain Genitourinary: Negative for hematuria, flank pain  or other change in urine volume.  Musculoskeletal: Negative for myalgias or other joint swelling.  Skin: Negative for other color change, or other wound or worsening drainage.  Neurological: Negative for other syncope or numbness. Hematological: Negative for other adenopathy or swelling Psychiatric/Behavioral: Negative for hallucinations, other worsening agitation, SI, self-injury, or new decreased concentration All other system neg per pt     Objective:   Physical Exam BP 130/82   Pulse 80   Temp 98.4 F (36.9 C) (Oral)   Ht 5' 4.25" (1.632 m)   Wt 157 lb (71.2 kg)   LMP 05/22/1996   SpO2 95%   BMI 26.74 kg/m  VS noted,  Constitutional: Pt is oriented to person, place, and time. Appears well-developed and well-nourished, in no significant distress and comfortable Head: Normocephalic and atraumatic  Eyes: Conjunctivae and EOM are normal. Pupils are equal, round, and reactive to light Right Ear: External ear normal without discharge Left Ear: External ear normal without discharge Nose: Nose without discharge or deformity Mouth/Throat: Oropharynx is without other ulcerations and moist  Neck: Normal range of motion. Neck supple. No JVD present. No tracheal deviation present or significant neck LA or mass Cardiovascular: Normal rate, regular rhythm, normal heart sounds and intact distal pulses.   Pulmonary/Chest: WOB normal and breath sounds without rales or wheezing  Abdominal: Soft. Bowel sounds are normal. NT. No HSM  Musculoskeletal: Normal range of motion. Exhibits no edema Lymphadenopathy: Has no other cervical adenopathy.  Neurological: Pt is alert and oriented to person, place, and time. Pt has normal reflexes. No cranial nerve deficit. Motor grossly intact, Gait intact Skin: Skin is warm and dry. No rash noted or new ulcerations Psychiatric:  Has normal mood and affect. Behavior is normal without agitation No other exam findings    Assessment & Plan:

## 2017-10-25 NOTE — Progress Notes (Signed)
Erroneous encounter

## 2017-10-26 ENCOUNTER — Other Ambulatory Visit: Payer: Self-pay | Admitting: Internal Medicine

## 2017-10-26 DIAGNOSIS — E559 Vitamin D deficiency, unspecified: Secondary | ICD-10-CM | POA: Diagnosis not present

## 2017-10-26 DIAGNOSIS — R7302 Impaired glucose tolerance (oral): Secondary | ICD-10-CM | POA: Diagnosis not present

## 2017-10-26 DIAGNOSIS — Z0000 Encounter for general adult medical examination without abnormal findings: Secondary | ICD-10-CM | POA: Diagnosis not present

## 2017-10-27 LAB — CBC WITH DIFFERENTIAL/PLATELET
Basophils Absolute: 0 10*3/uL (ref 0.0–0.2)
Basos: 0 %
EOS (ABSOLUTE): 0.1 10*3/uL (ref 0.0–0.4)
Eos: 2 %
Hematocrit: 37.9 % (ref 34.0–46.6)
Hemoglobin: 12.8 g/dL (ref 11.1–15.9)
Immature Grans (Abs): 0 10*3/uL (ref 0.0–0.1)
Immature Granulocytes: 0 %
Lymphocytes Absolute: 1.4 10*3/uL (ref 0.7–3.1)
Lymphs: 26 %
MCH: 31.1 pg (ref 26.6–33.0)
MCHC: 33.8 g/dL (ref 31.5–35.7)
MCV: 92 fL (ref 79–97)
Monocytes Absolute: 0.4 10*3/uL (ref 0.1–0.9)
Monocytes: 8 %
Neutrophils Absolute: 3.5 10*3/uL (ref 1.4–7.0)
Neutrophils: 64 %
Platelets: 237 10*3/uL (ref 150–450)
RBC: 4.11 x10E6/uL (ref 3.77–5.28)
RDW: 14.1 % (ref 12.3–15.4)
WBC: 5.4 10*3/uL (ref 3.4–10.8)

## 2017-10-27 LAB — URINALYSIS, ROUTINE W REFLEX MICROSCOPIC
BILIRUBIN UA: NEGATIVE
Glucose, UA: NEGATIVE
Ketones, UA: NEGATIVE
LEUKOCYTES UA: NEGATIVE
Nitrite, UA: NEGATIVE
PH UA: 6 (ref 5.0–7.5)
Protein, UA: NEGATIVE
RBC UA: NEGATIVE
Specific Gravity, UA: 1.019 (ref 1.005–1.030)
Urobilinogen, Ur: 0.2 mg/dL (ref 0.2–1.0)

## 2017-10-27 LAB — COMPREHENSIVE METABOLIC PANEL
ALBUMIN: 4.3 g/dL (ref 3.5–4.8)
ALT: 14 IU/L (ref 0–32)
AST: 19 IU/L (ref 0–40)
Albumin/Globulin Ratio: 1.8 (ref 1.2–2.2)
Alkaline Phosphatase: 86 IU/L (ref 39–117)
BUN / CREAT RATIO: 18 (ref 12–28)
BUN: 12 mg/dL (ref 8–27)
Bilirubin Total: 0.5 mg/dL (ref 0.0–1.2)
CALCIUM: 9 mg/dL (ref 8.7–10.3)
CO2: 28 mmol/L (ref 20–29)
CREATININE: 0.66 mg/dL (ref 0.57–1.00)
Chloride: 104 mmol/L (ref 96–106)
GFR calc Af Amer: 102 mL/min/{1.73_m2} (ref >59)
GFR calc non Af Amer: 89 mL/min/{1.73_m2} (ref >59)
GLOBULIN, TOTAL: 2.4 g/dL (ref 1.5–4.5)
Glucose: 106 mg/dL — ABNORMAL HIGH (ref 65–99)
Potassium: 3.8 mmol/L (ref 3.5–5.2)
SODIUM: 143 mmol/L (ref 134–144)
Total Protein: 6.7 g/dL (ref 6.0–8.5)

## 2017-10-27 LAB — LIPID PANEL W/O CHOL/HDL RATIO
Cholesterol, Total: 158 mg/dL (ref 100–199)
HDL: 31 mg/dL — AB (ref >39)
LDL Calculated: 90 mg/dL (ref 0–99)
Triglycerides: 185 mg/dL — ABNORMAL HIGH (ref 0–149)
VLDL CHOLESTEROL CAL: 37 mg/dL (ref 5–40)

## 2017-10-27 LAB — VITAMIN D 25 HYDROXY (VIT D DEFICIENCY, FRACTURES): Vit D, 25-Hydroxy: 35.2 ng/mL (ref 30.0–100.0)

## 2017-10-27 LAB — SPECIMEN STATUS REPORT

## 2017-10-27 LAB — HGB A1C W/O EAG: Hgb A1c MFr Bld: 5.8 % — ABNORMAL HIGH (ref 4.8–5.6)

## 2017-10-27 LAB — TSH: TSH: 3.78 u[IU]/mL (ref 0.450–4.500)

## 2017-10-28 ENCOUNTER — Encounter: Payer: Self-pay | Admitting: Internal Medicine

## 2017-10-28 NOTE — Assessment & Plan Note (Signed)
Also for vit d with labs

## 2017-10-28 NOTE — Assessment & Plan Note (Signed)
stable overall by history and exam, recent data reviewed with pt, and pt to continue medical treatment as before,  to f/u any worsening symptoms or concerns Lab Results  Component Value Date   HGBA1C 5.7 (H) 10/24/2016

## 2017-10-28 NOTE — Assessment & Plan Note (Signed)

## 2017-10-29 ENCOUNTER — Encounter: Payer: Self-pay | Admitting: Internal Medicine

## 2017-10-30 MED FILL — SHINGRIX VIAL KIT: 50 | 1 days supply | Qty: 1 | Fill #1

## 2017-10-31 ENCOUNTER — Telehealth: Payer: Self-pay

## 2017-10-31 NOTE — Telephone Encounter (Signed)
Spoke with patient. Appointment scheduled to see Dr.Silva for evaluation of anal lesion and possible biopsy on 11/02/2017 at 12:30 pm. Patient is agreeable to date and time.  Routing to provider for final review. Patient agreeable to disposition. Will close encounter.

## 2017-10-31 NOTE — Telephone Encounter (Signed)
-----   Message from Regina Eck, CNM sent at 10/30/2017  2:17 PM EDT ----- Regarding: anal lesion and possible biopsy Anal lesion for evaluation with Dr. Quincy Simmonds

## 2017-11-02 ENCOUNTER — Ambulatory Visit (INDEPENDENT_AMBULATORY_CARE_PROVIDER_SITE_OTHER): Payer: Medicare Other | Admitting: Obstetrics and Gynecology

## 2017-11-02 ENCOUNTER — Encounter: Payer: Self-pay | Admitting: Obstetrics and Gynecology

## 2017-11-02 ENCOUNTER — Other Ambulatory Visit: Payer: Self-pay

## 2017-11-02 VITALS — BP 120/62 | HR 72 | Resp 16 | Ht 64.25 in | Wt 155.0 lb

## 2017-11-02 DIAGNOSIS — K629 Disease of anus and rectum, unspecified: Secondary | ICD-10-CM

## 2017-11-02 DIAGNOSIS — L821 Other seborrheic keratosis: Secondary | ICD-10-CM | POA: Diagnosis not present

## 2017-11-02 DIAGNOSIS — L905 Scar conditions and fibrosis of skin: Secondary | ICD-10-CM | POA: Diagnosis not present

## 2017-11-02 NOTE — Patient Instructions (Addendum)
Perianal Biopsy, Care After These instructions give you information about caring for yourself after your procedure. Your doctor may also give you more specific instructions. Call your doctor if you have any problems or questions after your procedure. Follow these instructions at home: Biopsy Site Care   Do not rub the biopsy area after peeing (urinating). Gently: ? Pat the area dry. Or, use a bottle filled with warm water (peri-bottle) to clean the area. ? Wipe from front to back.  Follow instructions from your doctor about how to take care of your biopsy site. Make sure you: ? Clean the area using water and mild soap twice a day or as told by your doctor. Gently pat the area dry. ? If you were prescribed an antibiotic medical ointment, apply it as told by your doctor. Do not stop using the antibiotic even if your condition gets better. ? Take a warm water bath that is taken while you are sitting down (sitz bath). Do this as needed to help with pain. ? Leave stitches (sutures), skin glue, or skin tape (adhesive) strips in place. They may need to stay in place for 2 weeks or longer. If tape strips get loose and curl up, you may trim the loose edges. Do not remove tape strips completely unless your doctor says it is okay.  Check your biopsy site every day for signs of infection. Check for: ? More redness, swelling, or pain. ? More fluid or blood. ? Warmth. ? Pus or a bad smell. Lifestyle  Wear loose, cotton underwear.  Do not wear tight pants.  Do not use a tampon, douche, or put anything in your vagina for at least one week or until your doctor says it is okay.  Do not have sex for at least one week or until your doctor says it is okay.  Do not exercise until your doctor says it is okay.  Do not take baths, swim, or use a hot tub until your doctor says it is okay. General instructions  Take over-the-counter and prescription medicines only as told by your doctor.  Use a sanitary  pad until bleeding stops.  Keep all follow-up visits as told by your doctor. This is important.  If the sample is being sent for testing, it is your responsibility to get the results of your procedure. Ask your doctor or the department doing the procedure when your results will be ready. Contact a doctor if:  You have more redness, swelling, or pain around your biopsy site.  You have more fluid or blood coming from your biopsy site.  Your biopsy site feels warm when you touch it.  Medicine does not help your pain. Get help right away if:  You have a lot of bleeding from the vulva.  You have pus or a bad smell coming from your biopsy site.  You have a fever.  You have lower belly pain. This information is not intended to replace advice given to you by your health care provider. Make sure you discuss any questions you have with your health care provider.

## 2017-11-02 NOTE — Progress Notes (Signed)
GYNECOLOGY  VISIT   HPI: 72 y.o.   Married  Caucasian  female   G2P2002 with Patient's last menstrual period was 05/22/1996.   here for anal lesion for 2 weeks.   Notes a brown lesion when she was checking herself.  No pain or bleeding.  No drainage.   No hx of hemorrhoids.   Has colonoscopy in 2021.   Hx spongiotic dermatitis.   GYNECOLOGIC HISTORY: Patient's last menstrual period was 05/22/1996. Contraception:  Hysterectomy Menopausal hormone therapy:  none Last mammogram:  04-23-17 category c density birads 1:neg Last pap smear: 03/17/09 neg        OB History    Gravida  2   Para  2   Term  2   Preterm  0   AB  0   Living  2     SAB  0   TAB  0   Ectopic  0   Multiple  0   Live Births  2              Patient Active Problem List   Diagnosis Date Noted  . Vitamin D deficiency 10/25/2017  . Rash 05/24/2017  . Obesity 05/24/2017  . Hypothyroidism 10/26/2015  . Peripheral edema 10/22/2015  . Cervical dystonia 07/14/2015  . Weakness generalized 11/16/2014  . Insomnia 11/16/2014  . S/P cervical spinal fusion 11/05/2014  . Preop exam for internal medicine 10/14/2014  . Essential hypertension 10/14/2014  . Fracture, cervical vertebra ([place]) 08/30/2014  . Preventative health care 10/08/2013  . Impaired glucose tolerance 10/08/2013  . Carotid stenosis 10/08/2013  . Depression   . Diverticulitis   . Anxiety   . Psoriasis   . Cervical disc disease   . Diverticulitis of colon (without mention of hemorrhage)(562.11) 05/10/2013    Past Medical History:  Diagnosis Date  . Anxiety   . Arthritis   . Carotid stenosis 10/08/2013  . Cervical disc disease   . Depression   . Diverticulitis   . Diverticulosis   . Frequent headaches   . Hypertension   . Hypothyroidism 10/26/2015  . Impaired glucose tolerance 10/08/2013   normal HgA1C  . Neck pain   . Psoriasis     Past Surgical History:  Procedure Laterality Date  . BREAST BIOPSY Left 2011  .  CERVICAL FUSION     approx 1980  . CHOLECYSTECTOMY    . POSTERIOR CERVICAL FUSION/FORAMINOTOMY N/A 11/05/2014   Procedure: Cervical four to Cervical seven  Posterior Cervical Fusion with lateral mass fixation;  Surgeon: Eustace Moore, MD;  Location: [facility];  Service: Neurosurgery;  Laterality: N/A;   C4 - C7 Posterior Cervical Fusion with lateral mass fixation  . TOTAL ABDOMINAL HYSTERECTOMY  1998   Fibroid    Current Outpatient Medications  Medication Sig Dispense Refill  . amLODipine (NORVASC) 5 MG tablet Take 1 tablet (5 mg total) by mouth daily. 90 tablet 3  . aspirin EC 325 MG tablet Take 325 mg by mouth daily.    . calcium carbonate (OS-CAL) 1250 (500 CA) MG chewable tablet Chew 2 tablets by mouth daily.    . cholecalciferol (VITAMIN D) 1000 units tablet Take 1,000 Units by mouth daily.    . clotrimazole-betamethasone (LOTRISONE) cream Use as directed twice daily as needed to affected area 15 g 1  . escitalopram (LEXAPRO) 10 MG tablet Take 1 tablet (10 mg total) by mouth daily. 90 tablet 3  . hydrochlorothiazide (MICROZIDE) 12.5 MG capsule Take 1 capsule (12.5 mg  total) by mouth daily. 90 capsule 3  . levothyroxine (SYNTHROID, LEVOTHROID) 50 MCG tablet Take 1 tablet (50 mcg total) by mouth daily. 90 tablet 3  . phentermine 30 MG capsule Take 1 capsule (30 mg total) by mouth every morning. 30 capsule 2  . polyethylene glycol (MIRALAX / GLYCOLAX) packet Take 17 g by mouth daily.    Marland Kitchen triamcinolone (KENALOG) 0.025 % ointment     . zolpidem (AMBIEN) 5 MG tablet Take 1 tablet (5 mg total) by mouth at bedtime as needed. for sleep 90 tablet 1   No current facility-administered medications for this visit.      ALLERGIES: Celebrex [celecoxib]  Family History  Problem Relation Age of Onset  . Heart disease Mother   . Diabetes Mother   . Cervical cancer Mother   . Heart attack Mother   . Lung cancer Father   . Alcoholism Father   . Diabetes Brother   . Throat cancer Brother   .  Cancer Other        lung cancer  . Heart disease Other   . Hypertension Other   . Arthritis Other   . Alcohol abuse Other     Social History   Socioeconomic History  . Marital status: Married    Spouse name: Not on file  . Number of children: 2  . Years of education: 31  . Highest education level: Not on file  Occupational History  . Occupation: retired  Scientific laboratory technician  . Financial resource strain: Not on file  . Food insecurity:    Worry: Not on file    Inability: Not on file  . Transportation needs:    Medical: Not on file    Non-medical: Not on file  Tobacco Use  . Smoking status: Never Smoker  . Smokeless tobacco: Never Used  Substance and Sexual Activity  . Alcohol use: Yes    Alcohol/week: 0.0 oz    Comment: 1 a month  . Drug use: No  . Sexual activity: Yes    Partners: Male    Birth control/protection: Surgical    Comment: Hyst  Lifestyle  . Physical activity:    Days per week: Not on file    Minutes per session: Not on file  . Stress: Not on file  Relationships  . Social connections:    Talks on phone: Not on file    Gets together: Not on file    Attends religious service: Not on file    Active member of club or organization: Not on file    Attends meetings of clubs or organizations: Not on file    Relationship status: Not on file  . Intimate partner violence:    Fear of current or ex partner: Not on file    Emotionally abused: Not on file    Physically abused: Not on file    Forced sexual activity: Not on file  Other Topics Concern  . Not on file  Social History Narrative   Lives at home with husband and son.   Right-handed.   2 cups caffeine daily.    Review of Systems  Constitutional: Negative.   HENT: Negative.   Eyes: Negative.   Respiratory: Negative.   Cardiovascular: Negative.   Gastrointestinal: Negative.   Endocrine: Negative.   Genitourinary:       Anal lesion  Musculoskeletal: Negative.   Skin: Negative.    Allergic/Immunologic: Negative.   Neurological: Negative.   Hematological: Negative.   Psychiatric/Behavioral: Negative.  PHYSICAL EXAMINATION:    BP 120/62 (BP Location: Right Arm, Patient Position: Sitting, Cuff Size: Normal)   Pulse 72   Resp 16   Ht 5' 4.25" (1.632 m)   Wt 155 lb (70.3 kg)   LMP 05/22/1996   BMI 26.40 kg/m     General appearance: alert, cooperative and appears stated age    Pelvic: External genitalia:  no lesions.  Ring of hypopigmentation around the clitoris and labia minora and anus.  Anus:  Area of hyperpigmentation at 8:00, 8 mm.          Perianal biopsy.  Consent for procedure. Sterile prep with betadine.  Local 1% lidocaine, lot [id number], exp 1/23. 3 mm punch biopsy used and tissue to pathology.  AgNo3 used.  Simple suture of 2/0 Vicryl.  Minimal EBL.  No complications.   Chaperone was present for exam.  ASSESSMENT  Perianal lesion. Hx spongiotic dermatitis.   PLAN  FU biopsy results. Instructions and precautions given. FU prn.    An After Visit Summary was printed and given to the patient.  ___10___ minutes face to face time of which over 50% was spent in counseling.

## 2017-11-08 ENCOUNTER — Telehealth: Payer: Self-pay

## 2017-11-08 NOTE — Telephone Encounter (Signed)
-----   Message from Nunzio Cobbs, MD sent at 11/07/2017  8:59 AM EDT ----- Please inform patient of biopsy results showing seborrheic keratosis and focal fibrosis.  No cancer was seen.  No further treatment is needed.

## 2017-11-08 NOTE — Telephone Encounter (Signed)
Patient notified of negative biopsy results.

## 2018-03-14 ENCOUNTER — Other Ambulatory Visit: Payer: Self-pay | Admitting: Obstetrics and Gynecology

## 2018-03-14 DIAGNOSIS — Z1231 Encounter for screening mammogram for malignant neoplasm of breast: Secondary | ICD-10-CM

## 2018-03-27 DIAGNOSIS — Z23 Encounter for immunization: Secondary | ICD-10-CM | POA: Diagnosis not present

## 2018-04-29 ENCOUNTER — Ambulatory Visit: Payer: Medicare Other

## 2018-06-04 ENCOUNTER — Ambulatory Visit: Payer: Medicare Other

## 2018-06-06 ENCOUNTER — Other Ambulatory Visit: Payer: Self-pay | Admitting: Internal Medicine

## 2018-06-06 NOTE — Telephone Encounter (Signed)
Done erx 

## 2018-06-13 ENCOUNTER — Ambulatory Visit
Admission: RE | Admit: 2018-06-13 | Discharge: 2018-06-13 | Disposition: A | Discharge status: Home / Self Care | Payer: Medicare Other | Source: Ambulatory Visit | Attending: Obstetrics and Gynecology | Admitting: Obstetrics and Gynecology

## 2018-06-13 DIAGNOSIS — Z1231 Encounter for screening mammogram for malignant neoplasm of breast: Secondary | ICD-10-CM | POA: Diagnosis not present

## 2018-08-07 ENCOUNTER — Other Ambulatory Visit: Payer: Self-pay | Admitting: Internal Medicine

## 2018-10-15 ENCOUNTER — Ambulatory Visit (INDEPENDENT_AMBULATORY_CARE_PROVIDER_SITE_OTHER): Payer: Medicare Other | Admitting: Internal Medicine

## 2018-10-15 DIAGNOSIS — F419 Anxiety disorder, unspecified: Secondary | ICD-10-CM | POA: Diagnosis not present

## 2018-10-15 MED ORDER — ALPRAZOLAM 1 MG PO TABS
ORAL_TABLET | ORAL | 1 refills | Status: DC
Start: 2018-10-15 — End: 2018-12-23

## 2018-10-15 NOTE — Progress Notes (Signed)
Patient ID: Lauren [name], female   DOB: 05/07/1946, 73 y.o.   MRN: [id number]  Cumulative time during 7-day interval 12 min, there was not an associated office visit for this concern within a 7 day period.  Verbal consent for services obtained from patient prior to services given.  Names of all persons present for services: Cathlean Cower, MD, patient  Chief complaint: anxiety  History, background, results pertinent:  Here to c/o 2 wks ongoing increased stress since husband with cancer and PNA came home from hospn; Has a 6/2 PET scan, and 6/9 PCP f/u appt, but her anxiety is "over the top" and finds it difficult to calm and be thoughtful and not forgetful.  Really struggling to cope.  Husband is not involved with hospice currently.  Has friends and family, declines counseling referral.  Denies worsening depressive symptoms, suicidal ideation, or panic. Past Medical History:  Diagnosis Date  . Anxiety   . Arthritis   . Carotid stenosis 10/08/2013  . Cervical disc disease   . Depression   . Diverticulitis   . Diverticulosis   . Frequent headaches   . Hypertension   . Hypothyroidism 10/26/2015  . Impaired glucose tolerance 10/08/2013   normal HgA1C  . Neck pain   . Psoriasis    No results found for this or any previous visit (from the past 48 hour(s)).  Current Outpatient Medications on File Prior to Visit  Medication Sig Dispense Refill  . amLODipine (NORVASC) 5 MG tablet Take 1 tablet (5 mg total) by mouth daily. 90 tablet 3  . aspirin EC 325 MG tablet Take 325 mg by mouth daily.    . calcium carbonate (OS-CAL) 1250 (500 CA) MG chewable tablet Chew 2 tablets by mouth daily.    . cholecalciferol (VITAMIN D) 1000 units tablet Take 1,000 Units by mouth daily.    . clotrimazole-betamethasone (LOTRISONE) cream Use as directed twice daily as needed to affected area 15 g 1  . escitalopram (LEXAPRO) 10 MG tablet TAKE 1 TABLET BY MOUTH  DAILY 90 tablet 1  . hydrochlorothiazide (MICROZIDE)  12.5 MG capsule Take 1 capsule (12.5 mg total) by mouth daily. 90 capsule 3  . levothyroxine (SYNTHROID, LEVOTHROID) 50 MCG tablet Take 1 tablet (50 mcg total) by mouth daily. 90 tablet 3  . phentermine 30 MG capsule Take 1 capsule (30 mg total) by mouth every morning. 30 capsule 2  . polyethylene glycol (MIRALAX / GLYCOLAX) packet Take 17 g by mouth daily.    Marland Kitchen triamcinolone (KENALOG) 0.025 % ointment     . zolpidem (AMBIEN) 5 MG tablet TAKE 1 TABLET BY MOUTH AT  BEDTIME AS NEEDED FOR SLEEP 90 tablet 1   No current facility-administered medications on file prior to visit.    A/P/next steps:   Anxiety - situationally much worse in last 2 wks, hard to keep on task and functional and he really needs her; denies worsening depression or SI or HI, declines counseling, ok for xanax 1 mg 1/2 - 1 ab bid prn.   to f/u any worsening symptoms or concerns  Cathlean Cower MD

## 2018-10-20 ENCOUNTER — Encounter: Payer: Self-pay | Admitting: Internal Medicine

## 2018-10-20 NOTE — Assessment & Plan Note (Signed)
See notes

## 2018-10-20 NOTE — Patient Instructions (Signed)
Please take all new medication as prescribed - the xanax  Please continue all other medications as before, and refills have been done if requested.  Please have the pharmacy call with any other refills you may need.  Please keep your appointments with your specialists as you may have planned  Please call if you change your mind about referral for counseling

## 2018-10-29 ENCOUNTER — Ambulatory Visit: Payer: Medicare Other | Admitting: Certified Nurse Midwife

## 2018-10-30 ENCOUNTER — Encounter: Payer: Medicare Other | Admitting: Internal Medicine

## 2018-11-15 ENCOUNTER — Telehealth: Payer: Self-pay

## 2018-11-15 NOTE — Telephone Encounter (Signed)
Copied from [place] [id number]. Topic: General - Other >> Nov 15, 2018 12:19 PM Leward Quan A wrote: Reason for CRM: Patient called to request 2 copies of a DNR mailed to her home address on file. She did not elaborate just asked for it Please advise

## 2018-11-15 NOTE — Telephone Encounter (Signed)
Does she mean both for her?  thanks

## 2018-11-15 NOTE — Telephone Encounter (Signed)
I have spoke to the patient. She stated that they are both for her. She would like one to keep on her fridge and one to keep on handy with her in case anything was to happen.

## 2018-11-18 NOTE — Telephone Encounter (Signed)
Forms have been mailed.

## 2018-11-18 NOTE — Telephone Encounter (Signed)
Filled out and placed on desk for signature.

## 2018-11-18 NOTE — Telephone Encounter (Signed)
Ok, can you place 2 out of hospital DNR forms in my "in box" for me to sign, thanks

## 2018-12-13 ENCOUNTER — Other Ambulatory Visit: Payer: Self-pay | Admitting: Internal Medicine

## 2018-12-23 ENCOUNTER — Encounter: Payer: Self-pay | Admitting: Internal Medicine

## 2018-12-23 ENCOUNTER — Other Ambulatory Visit (INDEPENDENT_AMBULATORY_CARE_PROVIDER_SITE_OTHER): Payer: Medicare Other

## 2018-12-23 ENCOUNTER — Ambulatory Visit (INDEPENDENT_AMBULATORY_CARE_PROVIDER_SITE_OTHER): Payer: Medicare Other | Admitting: Internal Medicine

## 2018-12-23 ENCOUNTER — Other Ambulatory Visit: Payer: Self-pay

## 2018-12-23 VITALS — BP 118/76 | HR 77 | Ht 64.25 in | Wt 145.0 lb

## 2018-12-23 DIAGNOSIS — R319 Hematuria, unspecified: Secondary | ICD-10-CM | POA: Diagnosis not present

## 2018-12-23 DIAGNOSIS — Z0001 Encounter for general adult medical examination with abnormal findings: Secondary | ICD-10-CM | POA: Diagnosis not present

## 2018-12-23 DIAGNOSIS — F329 Major depressive disorder, single episode, unspecified: Secondary | ICD-10-CM

## 2018-12-23 DIAGNOSIS — R739 Hyperglycemia, unspecified: Secondary | ICD-10-CM

## 2018-12-23 DIAGNOSIS — E538 Deficiency of other specified B group vitamins: Secondary | ICD-10-CM | POA: Diagnosis not present

## 2018-12-23 DIAGNOSIS — F32A Depression, unspecified: Secondary | ICD-10-CM

## 2018-12-23 DIAGNOSIS — Z0000 Encounter for general adult medical examination without abnormal findings: Secondary | ICD-10-CM | POA: Diagnosis not present

## 2018-12-23 DIAGNOSIS — E611 Iron deficiency: Secondary | ICD-10-CM

## 2018-12-23 DIAGNOSIS — E559 Vitamin D deficiency, unspecified: Secondary | ICD-10-CM | POA: Diagnosis not present

## 2018-12-23 LAB — URINALYSIS, ROUTINE W REFLEX MICROSCOPIC
Bilirubin Urine: NEGATIVE
Hgb urine dipstick: NEGATIVE
Ketones, ur: NEGATIVE
Leukocytes,Ua: NEGATIVE
Nitrite: POSITIVE — AB
RBC / HPF: NONE SEEN (ref 0–?)
Specific Gravity, Urine: 1.025 (ref 1.000–1.030)
Total Protein, Urine: NEGATIVE
Urine Glucose: NEGATIVE
Urobilinogen, UA: 0.2 (ref 0.0–1.0)
pH: 6 (ref 5.0–8.0)

## 2018-12-23 LAB — IBC PANEL
Iron: 44 ug/dL (ref 42–145)
Saturation Ratios: 12.3 % — ABNORMAL LOW (ref 20.0–50.0)
Transferrin: 255 mg/dL (ref 212.0–360.0)

## 2018-12-23 LAB — HEMOGLOBIN A1C: Hgb A1c MFr Bld: 5.8 % (ref 4.6–6.5)

## 2018-12-23 LAB — LIPID PANEL
Cholesterol: 163 mg/dL (ref 0–200)
HDL: 30.8 mg/dL — ABNORMAL LOW (ref >39.00)
NonHDL: 132.09
Total CHOL/HDL Ratio: 5
Triglycerides: 301 mg/dL — ABNORMAL HIGH (ref 0.0–149.0)
VLDL: 60.2 mg/dL — ABNORMAL HIGH (ref 0.0–40.0)

## 2018-12-23 LAB — CBC WITH DIFFERENTIAL/PLATELET
Basophils Absolute: 0.1 10*3/uL (ref 0.0–0.1)
Basophils Relative: 0.8 % (ref 0.0–3.0)
Eosinophils Absolute: 0.1 10*3/uL (ref 0.0–0.7)
Eosinophils Relative: 1.3 % (ref 0.0–5.0)
HCT: 37.4 % (ref 36.0–46.0)
Hemoglobin: 12.7 g/dL (ref 12.0–15.0)
Lymphocytes Relative: 25.7 % (ref 12.0–46.0)
Lymphs Abs: 1.7 10*3/uL (ref 0.7–4.0)
MCHC: 33.9 g/dL (ref 30.0–36.0)
MCV: 95.4 fl (ref 78.0–100.0)
Monocytes Absolute: 0.6 10*3/uL (ref 0.1–1.0)
Monocytes Relative: 8.9 % (ref 3.0–12.0)
Neutro Abs: 4.3 10*3/uL (ref 1.4–7.7)
Neutrophils Relative %: 63.3 % (ref 43.0–77.0)
Platelets: 227 10*3/uL (ref 150.0–400.0)
RBC: 3.92 Mil/uL (ref 3.87–5.11)
RDW: 13.8 % (ref 11.5–15.5)
WBC: 6.8 10*3/uL (ref 4.0–10.5)

## 2018-12-23 LAB — BASIC METABOLIC PANEL
BUN: 16 mg/dL (ref 6–23)
CO2: 30 mEq/L (ref 19–32)
Calcium: 9.5 mg/dL (ref 8.4–10.5)
Chloride: 103 mEq/L (ref 96–112)
Creatinine, Ser: 1.01 mg/dL (ref 0.40–1.20)
GFR: 53.61 mL/min — ABNORMAL LOW (ref >60.00)
Glucose, Bld: 89 mg/dL (ref 70–99)
Potassium: 3.7 mEq/L (ref 3.5–5.1)
Sodium: 142 mEq/L (ref 135–145)

## 2018-12-23 LAB — HEPATIC FUNCTION PANEL
ALT: 16 U/L (ref 0–35)
AST: 20 U/L (ref 0–37)
Albumin: 4.3 g/dL (ref 3.5–5.2)
Alkaline Phosphatase: 66 U/L (ref 39–117)
Bilirubin, Direct: 0.1 mg/dL (ref 0.0–0.3)
Total Bilirubin: 0.5 mg/dL (ref 0.2–1.2)
Total Protein: 7.1 g/dL (ref 6.0–8.3)

## 2018-12-23 LAB — VITAMIN B12: Vitamin B-12: 403 pg/mL (ref 211–911)

## 2018-12-23 LAB — LDL CHOLESTEROL, DIRECT: Direct LDL: 85 mg/dL

## 2018-12-23 LAB — TSH: TSH: 3.39 u[IU]/mL (ref 0.35–4.50)

## 2018-12-23 LAB — VITAMIN D 25 HYDROXY (VIT D DEFICIENCY, FRACTURES): VITD: 60.91 ng/mL (ref 30.00–100.00)

## 2018-12-23 MED ORDER — ZOLPIDEM TARTRATE 5 MG PO TABS: 5.0000 mg | ORAL_TABLET | Freq: Every evening | ORAL | 1 refills | Status: DC | PRN

## 2018-12-23 MED ORDER — AMLODIPINE BESYLATE 5 MG PO TABS: 5.0000 mg | ORAL_TABLET | Freq: Every day | ORAL | 3 refills | Status: DC

## 2018-12-23 MED ORDER — ALPRAZOLAM 1 MG PO TABS: ORAL_TABLET | ORAL | 2 refills | Status: DC

## 2018-12-23 MED ORDER — CIPROFLOXACIN HCL 500 MG PO TABS: 500.0000 mg | ORAL_TABLET | Freq: Two times a day (BID) | ORAL | 0 refills | Status: DC

## 2018-12-23 MED ORDER — HYDROCHLOROTHIAZIDE 12.5 MG PO CAPS: 12.5000 mg | ORAL_CAPSULE | Freq: Every day | ORAL | 3 refills | Status: DC

## 2018-12-23 MED ORDER — LEVOTHYROXINE SODIUM 50 MCG PO TABS: 50.0000 ug | ORAL_TABLET | Freq: Every day | ORAL | 3 refills | Status: DC

## 2018-12-23 NOTE — Assessment & Plan Note (Signed)
With mild situational worsening, declines any med change, ok for f/u counseling as scheduled

## 2018-12-23 NOTE — Assessment & Plan Note (Signed)
For f/u lab today 

## 2018-12-23 NOTE — Assessment & Plan Note (Signed)

## 2018-12-23 NOTE — Patient Instructions (Signed)
Please take all new medication as prescribed - the antibiotic  Please continue all other medications as before, and refills have been done if requested.  Please have the pharmacy call with any other refills you may need.  Please continue your efforts at being more active, low cholesterol diet, and weight control.  You are otherwise up to date with prevention measures today.  Please keep your appointments with your specialists as you may have planned  Please go to the LAB in the Basement (turn left off the elevator) for the tests to be done today  You will be contacted by phone if any changes need to be made immediately.  Otherwise, you will receive a letter about your results with an explanation, but please check with MyChart first.  Please remember to sign up for MyChart if you have not done so, as this will be important to you in the future with finding out test results, communicating by private email, and scheduling acute appointments online when needed.  Please return in 1 year for your yearly visit, or sooner if needed

## 2018-12-23 NOTE — Progress Notes (Signed)
Subjective:    Patient ID: Lauren [name], female    DOB: [birth year], 73 y.o.   MRN: [id number]  HPI   Here for wellness and f/u;  Overall doing ok;  Pt denies Chest pain, worsening SOB, DOE, wheezing, orthopnea, PND, worsening LE edema, palpitations, dizziness or syncope.  Pt denies neurological change such as new headache, facial or extremity weakness.  Pt denies polydipsia, polyuria, or low sugar symptoms. Pt states overall good compliance with treatment and medications, good tolerability, and has been trying to follow appropriate diet.  Pt denies worsening depressive symptoms, suicidal ideation or panic. No fever, night sweats, wt loss, loss of appetite, or other constitutional symptoms.  Pt states good ability with ADL's, has low fall risk, home safety reviewed and adequate, no other significant changes in hearing or vision, and only occasionally active with exercise.   Also with c/o right flank pain, feverish, urinary frquency and small gross hematuria noted for 3 days, but Denies urinary symptoms such as dysuria, urgency, or n/v, fever, chills. 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  Has a therapy session already scheduled for 9/2 soonest she could get.  Denies suicidal ideation, or panic; has ongoing anxiet Past Medical History:  Diagnosis Date  . Anxiety   . Arthritis   . Carotid stenosis 10/08/2013  . Cervical disc disease   . Depression   . Diverticulitis   . Diverticulosis   . Frequent headaches   . Hypertension   . Hypothyroidism 10/26/2015  . Impaired glucose tolerance 10/08/2013   normal HgA1C  . Neck pain   . Psoriasis    Past Surgical History:  Procedure Laterality Date  . BREAST BIOPSY Left 2011  . CERVICAL FUSION     approx 1980  . CHOLECYSTECTOMY    . POSTERIOR CERVICAL FUSION/FORAMINOTOMY N/A 11/05/2014   Procedure: Cervical  four to Cervical seven  Posterior Cervical Fusion with lateral mass fixation;  Surgeon: Eustace Moore, MD;  Location: [facility];  Service: Neurosurgery;  Laterality: N/A;   C4 - C7 Posterior Cervical Fusion with lateral mass fixation  . TOTAL ABDOMINAL HYSTERECTOMY  1998   Fibroid    reports that she has never smoked. She has never used smokeless tobacco. She reports current alcohol use. She reports that she does not use drugs. family history includes Alcohol abuse in an other family member; Alcoholism in her father; Arthritis in an other family member; Cancer in an other family member; Cervical cancer in her mother; Diabetes in her brother and mother; Heart attack in her mother; Heart disease in her mother and another family member; Hypertension in an other family member; Lung cancer in her father; Throat cancer in her brother. Allergies  Allergen Reactions  . Celebrex [Celecoxib] Itching and Rash   Current Outpatient Medications on File Prior to Visit  Medication Sig Dispense Refill  . aspirin EC 325 MG tablet Take 325 mg by mouth daily.    . calcium carbonate (OS-CAL) 1250 (500 CA) MG chewable tablet Chew 2 tablets by mouth daily.    . cholecalciferol (VITAMIN D) 1000 units tablet Take 1,000 Units by mouth daily.    . clotrimazole-betamethasone (LOTRISONE) cream Use as directed twice daily as needed to affected area 15 g 1  . escitalopram (LEXAPRO) 10 MG tablet TAKE 1 TABLET BY MOUTH  DAILY 90 tablet 0  . polyethylene glycol (MIRALAX /  GLYCOLAX) packet Take 17 g by mouth daily.    Marland Kitchen triamcinolone (KENALOG) 0.025 % ointment      No current facility-administered medications on file prior to visit.    Review of Systems Constitutional: Negative for other unusual diaphoresis, sweats, appetite or weight changes HENT: Negative for other worsening hearing loss, ear pain, facial swelling, mouth sores or neck stiffness.   Eyes: Negative for other worsening pain, redness or other visual  disturbance.  Respiratory: Negative for other stridor or swelling Cardiovascular: Negative for other palpitations or other chest pain  Gastrointestinal: Negative for worsening diarrhea or loose stools, blood in stool, distention or other pain Genitourinary: Negative for hematuria, flank pain or other change in urine volume.  Musculoskeletal: Negative for myalgias or other joint swelling.  Skin: Negative for other color change, or other wound or worsening drainage.  Neurological: Negative for other syncope or numbness. Hematological: Negative for other adenopathy or swelling Psychiatric/Behavioral: Negative for hallucinations, other worsening agitation, SI, self-injury, or new decreased concentration All other system neg pre pt    Objective:   Physical Exam BP 118/76   Pulse 77   Ht 5' 4.25" (1.632 m)   Wt 145 lb (65.8 kg)   LMP 05/22/1996   SpO2 95%   BMI 24.70 kg/m  VS noted,  Constitutional: Pt is oriented to person, place, and time. Appears well-developed and well-nourished, in no significant distress and comfortable Head: Normocephalic and atraumatic  Eyes: Conjunctivae and EOM are normal. Pupils are equal, round, and reactive to light Right Ear: External ear normal without discharge Left Ear: External ear normal without discharge Nose: Nose without discharge or deformity Mouth/Throat: Oropharynx is without other ulcerations and moist  Neck: Normal range of motion. Neck supple. No JVD present. No tracheal deviation present or significant neck LA or mass Cardiovascular: Normal rate, regular rhythm, normal heart sounds and intact distal pulses.   Pulmonary/Chest: WOB normal and breath sounds without rales or wheezing  Abdominal: Soft. Bowel sounds are normal. NT. No HSM  Musculoskeletal: Normal range of motion. Exhibits no edema Lymphadenopathy: Has no other cervical adenopathy.  Neurological: Pt is alert and oriented to person, place, and time. Pt has normal reflexes. No  cranial nerve deficit. Motor grossly intact, Gait intact Skin: Skin is warm and dry. No rash noted or new ulcerations Psychiatric:  Has depressed mood and affect. Behavior is normal without agitation No other exam findings Lab Results  Component Value Date   WBC 6.8 12/23/2018   HGB 12.7 12/23/2018   HCT 37.4 12/23/2018   PLT 227.0 12/23/2018   GLUCOSE 89 12/23/2018   CHOL 163 12/23/2018   TRIG 301.0 (H) 12/23/2018   HDL 30.80 (L) 12/23/2018   LDLDIRECT 85.0 12/23/2018   LDLCALC 90 10/26/2017   ALT 16 12/23/2018   AST 20 12/23/2018   NA 142 12/23/2018   K 3.7 12/23/2018   CL 103 12/23/2018   CREATININE 1.01 12/23/2018   BUN 16 12/23/2018   CO2 30 12/23/2018   TSH 3.39 12/23/2018   INR 0.97 10/27/2014   HGBA1C 5.8 12/23/2018      Assessment & Plan:

## 2018-12-23 NOTE — Assessment & Plan Note (Addendum)
etiiology unclear, possible uti/right pyelonphritis vs renal stone, for urine studies, empiric antibx, consider urology if cx negative  In addition to the time spent performing CPE, I spent an additional 25 minutes face to face,in which greater than 50% of this time was spent in counseling and coordination of care for patient's acute illness as documented, including the differential dx, treatment, further evaluation and other management of flank pain with hematuria, hyperglycemia, depression, vit d deficiency

## 2018-12-24 ENCOUNTER — Encounter: Payer: Self-pay | Admitting: Internal Medicine

## 2018-12-24 MED ORDER — ESCITALOPRAM OXALATE 10 MG PO TABS: 10.0000 mg | ORAL_TABLET | Freq: Every day | ORAL | 3 refills | Status: DC

## 2018-12-24 NOTE — Addendum Note (Signed)
Addended by: Biagio Borg on: 12/24/2018 09:50 PM   Modules accepted: Orders

## 2018-12-25 LAB — URINE CULTURE
MICRO NUMBER:: 731625
SPECIMEN QUALITY:: ADEQUATE

## 2019-01-06 ENCOUNTER — Other Ambulatory Visit: Payer: Self-pay | Admitting: Internal Medicine

## 2019-01-06 DIAGNOSIS — Z1231 Encounter for screening mammogram for malignant neoplasm of breast: Secondary | ICD-10-CM

## 2019-02-07 ENCOUNTER — Other Ambulatory Visit: Payer: Self-pay

## 2019-02-07 ENCOUNTER — Ambulatory Visit (INDEPENDENT_AMBULATORY_CARE_PROVIDER_SITE_OTHER): Payer: Medicare Other

## 2019-02-07 DIAGNOSIS — Z23 Encounter for immunization: Secondary | ICD-10-CM | POA: Diagnosis not present

## 2019-03-10 ENCOUNTER — Emergency Department (HOSPITAL_COMMUNITY): Payer: Medicare Other

## 2019-03-10 ENCOUNTER — Other Ambulatory Visit: Payer: Self-pay

## 2019-03-10 ENCOUNTER — Emergency Department (HOSPITAL_COMMUNITY)
Admission: EM | Admit: 2019-03-10 | Discharge: 2019-03-10 | Disposition: A | Discharge status: Home / Self Care | Payer: Medicare Other | Attending: Emergency Medicine | Admitting: Emergency Medicine

## 2019-03-10 ENCOUNTER — Telehealth: Payer: Self-pay | Admitting: Internal Medicine

## 2019-03-10 DIAGNOSIS — E039 Hypothyroidism, unspecified: Secondary | ICD-10-CM | POA: Insufficient documentation

## 2019-03-10 DIAGNOSIS — I10 Essential (primary) hypertension: Secondary | ICD-10-CM | POA: Insufficient documentation

## 2019-03-10 DIAGNOSIS — Z7982 Long term (current) use of aspirin: Secondary | ICD-10-CM | POA: Insufficient documentation

## 2019-03-10 DIAGNOSIS — Z79899 Other long term (current) drug therapy: Secondary | ICD-10-CM | POA: Insufficient documentation

## 2019-03-10 DIAGNOSIS — R1032 Left lower quadrant pain: Secondary | ICD-10-CM | POA: Diagnosis present

## 2019-03-10 DIAGNOSIS — I959 Hypotension, unspecified: Secondary | ICD-10-CM | POA: Diagnosis not present

## 2019-03-10 DIAGNOSIS — K5792 Diverticulitis of intestine, part unspecified, without perforation or abscess without bleeding: Secondary | ICD-10-CM

## 2019-03-10 DIAGNOSIS — R079 Chest pain, unspecified: Secondary | ICD-10-CM | POA: Diagnosis not present

## 2019-03-10 DIAGNOSIS — K5732 Diverticulitis of large intestine without perforation or abscess without bleeding: Secondary | ICD-10-CM | POA: Diagnosis not present

## 2019-03-10 DIAGNOSIS — R197 Diarrhea, unspecified: Secondary | ICD-10-CM | POA: Diagnosis not present

## 2019-03-10 DIAGNOSIS — R1013 Epigastric pain: Secondary | ICD-10-CM | POA: Diagnosis not present

## 2019-03-10 DIAGNOSIS — R109 Unspecified abdominal pain: Secondary | ICD-10-CM | POA: Diagnosis not present

## 2019-03-10 DIAGNOSIS — R1084 Generalized abdominal pain: Secondary | ICD-10-CM | POA: Diagnosis not present

## 2019-03-10 LAB — URINALYSIS, ROUTINE W REFLEX MICROSCOPIC
Bilirubin Urine: NEGATIVE
Glucose, UA: NEGATIVE mg/dL
Hgb urine dipstick: NEGATIVE
Ketones, ur: NEGATIVE mg/dL
Nitrite: NEGATIVE
Protein, ur: NEGATIVE mg/dL
Specific Gravity, Urine: 1.015 (ref 1.005–1.030)
pH: 5 (ref 5.0–8.0)

## 2019-03-10 LAB — LIPASE, BLOOD: Lipase: 356 U/L — ABNORMAL HIGH (ref 11–51)

## 2019-03-10 LAB — COMPREHENSIVE METABOLIC PANEL
ALT: 22 U/L (ref 0–44)
AST: 43 U/L — ABNORMAL HIGH (ref 15–41)
Albumin: 4 g/dL (ref 3.5–5.0)
Alkaline Phosphatase: 75 U/L (ref 38–126)
Anion gap: 11 (ref 5–15)
BUN: 13 mg/dL (ref 8–23)
CO2: 25 mmol/L (ref 22–32)
Calcium: 9.2 mg/dL (ref 8.9–10.3)
Chloride: 99 mmol/L (ref 98–111)
Creatinine, Ser: 0.85 mg/dL (ref 0.44–1.00)
GFR calc Af Amer: >60 mL/min (ref >60)
GFR calc non Af Amer: >60 mL/min (ref >60)
Glucose, Bld: 96 mg/dL (ref 70–99)
Potassium: 4.2 mmol/L (ref 3.5–5.1)
Sodium: 135 mmol/L (ref 135–145)
Total Bilirubin: 0.7 mg/dL (ref 0.3–1.2)
Total Protein: 7 g/dL (ref 6.5–8.1)

## 2019-03-10 LAB — CBC
HCT: 39.4 % (ref 36.0–46.0)
Hemoglobin: 12.9 g/dL (ref 12.0–15.0)
MCH: 31.9 pg (ref 26.0–34.0)
MCHC: 32.7 g/dL (ref 30.0–36.0)
MCV: 97.3 fL (ref 80.0–100.0)
Platelets: 240 10*3/uL (ref 150–400)
RBC: 4.05 MIL/uL (ref 3.87–5.11)
RDW: 13.1 % (ref 11.5–15.5)
WBC: 10.5 10*3/uL (ref 4.0–10.5)
nRBC: 0 % (ref 0.0–0.2)

## 2019-03-10 LAB — TROPONIN I (HIGH SENSITIVITY)
Troponin I (High Sensitivity): <2 ng/L (ref <18)
Troponin I (High Sensitivity): 3 ng/L (ref <18)

## 2019-03-10 MED ORDER — ONDANSETRON HCL 4 MG/2ML IJ SOLN
4.0000 mg | Freq: Once | INTRAMUSCULAR | Status: AC
  Administered 2019-03-10: 4 mg via INTRAVENOUS
  Filled 2019-03-10: qty 2

## 2019-03-10 MED ORDER — MORPHINE SULFATE (PF) 4 MG/ML IV SOLN
4.0000 mg | Freq: Once | INTRAVENOUS | Status: AC
  Administered 2019-03-10: 13:00:00 4 mg via INTRAVENOUS
  Filled 2019-03-10: qty 1

## 2019-03-10 MED ORDER — AMOXICILLIN-POT CLAVULANATE 875-125 MG PO TABS: 1.0000 | ORAL_TABLET | Freq: Two times a day (BID) | ORAL | 0 refills | Status: DC

## 2019-03-10 MED ORDER — ONDANSETRON 4 MG PO TBDP: 4.0000 mg | ORAL_TABLET | Freq: Four times a day (QID) | ORAL | 0 refills | Status: DC | PRN

## 2019-03-10 MED ORDER — IOHEXOL 300 MG/ML  SOLN
100.0000 mL | Freq: Once | INTRAMUSCULAR | Status: AC | PRN
  Administered 2019-03-10: 100 mL via INTRAVENOUS

## 2019-03-10 NOTE — ED Notes (Signed)
Transported to CT 

## 2019-03-10 NOTE — Discharge Instructions (Signed)
Please take antibiotic as prescribed.  Recommend taking Tylenol, Motrin as needed for pain control.  Additionally for nausea, can take the prescribed Zofran as needed.  Recommend schedule follow-up appointment for recheck later this week either with your primary doctor or your gastroenterologist.  If in the meantime you develop fever, worsening abdominal pain, vomiting, other new concerning symptom recommend return to ER for recheck.

## 2019-03-10 NOTE — ED Provider Notes (Signed)
Brief sign out note  73 year old lady presents ER chief complaint left lower quadrant pain, also noted to have chest pain.  At time of signout, CT abdomen pelvis pending.  Plan, disposition pending CT scan.  4:22 PM received signout from Dr. Ashok Cordia  6:13 PM updated patient on results, CT scan demonstrating simple diverticulitis, no perforation or abscess.  Patient's abdomen is soft, has some tenderness in the left lower quadrant but no rebound or guarding.  Her pain is currently 0 out of 10.  Believe she is appropriate for outpatient management this time.  Will start on Augmentin, provide Zofran Rx and recommend recheck with either PCP or her GI later this week.  Reviewed return precautions with patient and husband at bedside.     Lucrezia Starch, MD 03/10/19 [phone]

## 2019-03-10 NOTE — ED Notes (Signed)
Called CT on status of patient's scan. IV was not documented from EMS. Patient on the list to be transported. Patient and family notified.

## 2019-03-10 NOTE — Telephone Encounter (Signed)
Left message on machine to call back  

## 2019-03-10 NOTE — ED Provider Notes (Addendum)
[facility] Provider Note   CSN: [id number] Arrival date & time: 03/10/19  1215     History   Chief Complaint Chief Complaint  Patient presents with  . Abdominal Pain  . Chest Pain    HPI Lauren [name] is a 73 y.o. female.     Patient c/o LLQ abd pain acute onset this AM, that she indicates is similar to pain from diverticulitis in past. Pain constant, dull, non radiating, worse w palpation. Also notes mid chest pain onset this AM, at rest, dull, non radiating, not pleuritic. No associated sob, nv or diaphoresis. No palpitations. No hx cad. Denies other recent cp or discomfort. No exertional cp. No unusual doe or fatigue. Denies leg pain or swelling. Chest pain lasted several minutes and has resolved by time of ED presentation, LLQ abd pain persists. Denies fever or chills. No dysuria or gu c/o. No cough or uri symptoms.   The history is provided by the patient.  Abdominal Pain Associated symptoms: chest pain   Associated symptoms: no chills, no cough, no dysuria, no fever, no shortness of breath, no sore throat and no vomiting   Chest Pain Associated symptoms: abdominal pain   Associated symptoms: no cough, no fever, no headache, no palpitations, no shortness of breath and no vomiting     Past Medical History:  Diagnosis Date  . Anxiety   . Arthritis   . Carotid stenosis 10/08/2013  . Cervical disc disease   . Depression   . Diverticulitis   . Diverticulosis   . Frequent headaches   . Hypertension   . Hypothyroidism 10/26/2015  . Impaired glucose tolerance 10/08/2013   normal HgA1C  . Neck pain   . Psoriasis     Patient Active Problem List   Diagnosis Date Noted  . Hyperglycemia 12/23/2018  . Hematuria 12/23/2018  . Vitamin D deficiency 10/25/2017  . Rash 05/24/2017  . Obesity 05/24/2017  . Hypothyroidism 10/26/2015  . Peripheral edema 10/22/2015  . Cervical dystonia 07/14/2015  . Weakness generalized 11/16/2014   . Insomnia 11/16/2014  . S/P cervical spinal fusion 11/05/2014  . Preop exam for internal medicine 10/14/2014  . Essential hypertension 10/14/2014  . Fracture, cervical vertebra ([place]) 08/30/2014  . Encounter for well adult exam with abnormal findings 10/08/2013  . Impaired glucose tolerance 10/08/2013  . Carotid stenosis 10/08/2013  . Depression   . Diverticulitis   . Anxiety   . Psoriasis   . Cervical disc disease   . Diverticulitis of colon (without mention of hemorrhage)(562.11) 05/10/2013    Past Surgical History:  Procedure Laterality Date  . BREAST BIOPSY Left 2011  . CERVICAL FUSION     approx 1980  . CHOLECYSTECTOMY    . POSTERIOR CERVICAL FUSION/FORAMINOTOMY N/A 11/05/2014   Procedure: Cervical four to Cervical seven  Posterior Cervical Fusion with lateral mass fixation;  Surgeon: Eustace Moore, MD;  Location: [facility];  Service: Neurosurgery;  Laterality: N/A;   C4 - C7 Posterior Cervical Fusion with lateral mass fixation  . TOTAL ABDOMINAL HYSTERECTOMY  1998   Fibroid     OB History    Gravida  2   Para  2   Term  2   Preterm  0   AB  0   Living  2     SAB  0   TAB  0   Ectopic  0   Multiple  0   Live Births  2  Home Medications    Prior to Admission medications   Medication Sig Start Date End Date Taking? Authorizing Provider  ALPRAZolam Duanne Moron) 1 MG tablet 1/2  - 1 tab by mouth twice per day as needed Patient taking differently: Take 0.5-1 mg by mouth 2 (two) times daily as needed for anxiety.  12/23/18  Yes Biagio Borg, MD  amLODipine (NORVASC) 5 MG tablet Take 1 tablet (5 mg total) by mouth daily. 12/23/18  Yes Biagio Borg, MD  aspirin EC 325 MG tablet Take 325 mg by mouth daily.   Yes [provider]  cholecalciferol (VITAMIN D) 1000 units tablet Take 1,000 Units by mouth daily.   Yes [provider]  escitalopram (LEXAPRO) 10 MG tablet Take 1 tablet (10 mg total) by mouth daily. 12/24/18  Yes Biagio Borg, MD  hydrochlorothiazide (MICROZIDE) 12.5 MG capsule Take 1 capsule (12.5 mg total) by mouth daily. 12/23/18  Yes Biagio Borg, MD  levothyroxine (SYNTHROID) 50 MCG tablet Take 1 tablet (50 mcg total) by mouth daily. 12/23/18  Yes Biagio Borg, MD  vitamin E 100 UNIT capsule Take 100 Units by mouth daily.   Yes [provider]  zolpidem (AMBIEN) 5 MG tablet Take 1 tablet (5 mg total) by mouth at bedtime as needed. for sleep 12/23/18  Yes Biagio Borg, MD    Family History Family History  Problem Relation Age of Onset  . Heart disease Mother   . Diabetes Mother   . Cervical cancer Mother   . Heart attack Mother   . Lung cancer Father   . Alcoholism Father   . Diabetes Brother   . Throat cancer Brother   . Cancer Other        lung cancer  . Heart disease Other   . Hypertension Other   . Arthritis Other   . Alcohol abuse Other     Social History Social History   Tobacco Use  . Smoking status: Never Smoker  . Smokeless tobacco: Never Used  Substance Use Topics  . Alcohol use: Yes    Alcohol/week: 0.0 standard drinks    Comment: 1 a month  . Drug use: No     Allergies   Celebrex [celecoxib]   Review of Systems Review of Systems  Constitutional: Negative for chills and fever.  HENT: Negative for sore throat.   Eyes: Negative for redness.  Respiratory: Negative for cough and shortness of breath.   Cardiovascular: Positive for chest pain. Negative for palpitations and leg swelling.  Gastrointestinal: Positive for abdominal pain. Negative for vomiting.  Endocrine: Negative for polyuria.  Genitourinary: Negative for dysuria and flank pain.  Musculoskeletal: Negative for neck pain.  Skin: Negative for rash.  Neurological: Negative for headaches.  Hematological: Does not bruise/bleed easily.  Psychiatric/Behavioral: Negative for confusion.     Physical Exam Updated Vital Signs BP 120/64 (BP Location: Left Arm)   Pulse 71   Temp 99 F (37.2 C)  (Oral)   Resp 20   LMP 05/22/1996   Physical Exam Vitals signs and nursing note reviewed.  Constitutional:      Appearance: Normal appearance. She is well-developed.  HENT:     Head: Atraumatic.     Nose: Nose normal.     Mouth/Throat:     Mouth: Mucous membranes are moist.  Eyes:     General: No scleral icterus.    Conjunctiva/sclera: Conjunctivae normal.  Neck:     Musculoskeletal: Normal range of motion and  neck supple. No neck rigidity or muscular tenderness.     Trachea: No tracheal deviation.  Cardiovascular:     Rate and Rhythm: Normal rate and regular rhythm.     Pulses: Normal pulses.     Heart sounds: Normal heart sounds. No murmur. No friction rub. No gallop.   Pulmonary:     Effort: Pulmonary effort is normal. No respiratory distress.     Breath sounds: Normal breath sounds.  Chest:     Chest wall: No tenderness.  Abdominal:     General: Bowel sounds are normal. There is no distension.     Palpations: Abdomen is soft. There is no mass.     Tenderness: There is abdominal tenderness. There is no guarding or rebound.     Hernia: No hernia is present.     Comments: LLQ tenderness. No pulsatile mass.   Genitourinary:    Comments: No cva tenderness.  Musculoskeletal:        General: No swelling or tenderness.     Right lower leg: No edema.     Left lower leg: No edema.  Skin:    General: Skin is warm and dry.     Findings: No rash.  Neurological:     Mental Status: She is alert.     Comments: Alert, speech normal.   Psychiatric:        Mood and Affect: Mood normal.      ED Treatments / Results  Labs (all labs ordered are listed, but only abnormal results are displayed) Results for orders placed or performed during the hospital encounter of 03/10/19  CBC  Result Value Ref Range   WBC 10.5 4.0 - 10.5 K/uL   RBC 4.05 3.87 - 5.11 MIL/uL   Hemoglobin 12.9 12.0 - 15.0 g/dL   HCT 39.4 36.0 - 46.0 %   MCV 97.3 80.0 - 100.0 fL   MCH 31.9 26.0 - 34.0 pg    MCHC 32.7 30.0 - 36.0 g/dL   RDW 13.1 11.5 - 15.5 %   Platelets 240 150 - 400 K/uL   nRBC 0.0 0.0 - 0.2 %  Comprehensive metabolic panel  Result Value Ref Range   Sodium 135 135 - 145 mmol/L   Potassium 4.2 3.5 - 5.1 mmol/L   Chloride 99 98 - 111 mmol/L   CO2 25 22 - 32 mmol/L   Glucose, Bld 96 70 - 99 mg/dL   BUN 13 8 - 23 mg/dL   Creatinine, Ser 0.85 0.44 - 1.00 mg/dL   Calcium 9.2 8.9 - 10.3 mg/dL   Total Protein 7.0 6.5 - 8.1 g/dL   Albumin 4.0 3.5 - 5.0 g/dL   AST 43 (H) 15 - 41 U/L   ALT 22 0 - 44 U/L   Alkaline Phosphatase 75 38 - 126 U/L   Total Bilirubin 0.7 0.3 - 1.2 mg/dL   GFR calc non Af Amer >60 >60 mL/min   GFR calc Af Amer >60 >60 mL/min   Anion gap 11 5 - 15  Lipase, blood  Result Value Ref Range   Lipase 356 (H) 11 - 51 U/L  Troponin I (High Sensitivity)  Result Value Ref Range   Troponin I (High Sensitivity) <2 <18 ng/L   Dg Chest Port 1 View  Result Date: 03/10/2019 CLINICAL DATA:  Abdominal pain, chest pain. EXAM: PORTABLE CHEST 1 VIEW COMPARISON:  10/27/2014. FINDINGS: The heart size and mediastinal contours are within normal limits. Both lungs are clear. The visualized skeletal structures are  unremarkable. IMPRESSION: No active disease. Electronically Signed   By: Zetta Bills M.D.   On: 03/10/2019 12:38    EKG EKG Interpretation  Date/Time:  Monday March 10 2019 12:25:50 EDT Ventricular Rate:  70 PR Interval:    QRS Duration: 92 QT Interval:  385 QTC Calculation: 416 R Axis:   69 Text Interpretation:  Sinus rhythm No significant change since last tracing Confirmed by Lajean Saver [id number]) on 03/10/2019 12:34:11 PM   Radiology Dg Chest Port 1 View  Result Date: 03/10/2019 CLINICAL DATA:  Abdominal pain, chest pain. EXAM: PORTABLE CHEST 1 VIEW COMPARISON:  10/27/2014. FINDINGS: The heart size and mediastinal contours are within normal limits. Both lungs are clear. The visualized skeletal structures are unremarkable. IMPRESSION: No active  disease. Electronically Signed   By: Zetta Bills M.D.   On: 03/10/2019 12:38    Procedures Procedures (including critical care time)  Medications Ordered in ED Medications  morphine 4 MG/ML injection 4 mg (4 mg Intravenous Given 03/10/19 1239)  ondansetron (ZOFRAN) injection 4 mg (4 mg Intravenous Given 03/10/19 1239)     Initial Impression / Assessment and Plan / ED Course  I have reviewed the triage vital signs and the nursing notes.  Pertinent labs & imaging results that were available during my care of the patient were reviewed by me and considered in my medical decision making (see chart for details).  Iv ns. Ecg. Stat labs.   Reviewed nursing notes and prior charts for additional history.   Morphine iv. zofran iv.   Labs reviewed by me - initial trop is normal. No current chest pain. Lipase is high. No current epigastric pain or tenderness. Await CT.  CT remains pending.   Pain improved w meds.   1624 CT/delta trop pending - signed out to Dr Roslynn Amble to check labs and CT, recheck, and dispo appropriately.   Final Clinical Impressions(s) / ED Diagnoses   Final diagnoses:  None    ED Discharge Orders    None         Lajean Saver, MD 03/10/19 1624

## 2019-03-10 NOTE — ED Triage Notes (Signed)
Pt arrives via EMS from home with abdominal pain and chest pain that began upon waking today. Pt reports substernal chest, also having lower abdominal. Denies nausea, vomiting, diarrhea, fever. EKG NSR with PVCs. Vitals 118/70, 88 HR, 98%, 97Temp. 20g RAC, 324mg  ASA PTA.

## 2019-03-11 NOTE — Telephone Encounter (Signed)
Pt was seen in the ED yesterday per Epic.

## 2019-03-12 ENCOUNTER — Telehealth: Payer: Self-pay | Admitting: Internal Medicine

## 2019-03-12 NOTE — Telephone Encounter (Signed)
Patient requests I do her next colonoscopy - please change recall 08/2019 to me

## 2019-03-13 NOTE — Telephone Encounter (Signed)
Recall in system 08/2019.

## 2019-03-19 ENCOUNTER — Other Ambulatory Visit: Payer: Self-pay

## 2019-03-19 ENCOUNTER — Encounter: Payer: Self-pay | Admitting: Internal Medicine

## 2019-03-19 ENCOUNTER — Ambulatory Visit: Payer: Medicare Other | Admitting: Gastroenterology

## 2019-03-19 ENCOUNTER — Ambulatory Visit (INDEPENDENT_AMBULATORY_CARE_PROVIDER_SITE_OTHER): Payer: Medicare Other | Admitting: Internal Medicine

## 2019-03-19 VITALS — BP 114/76 | HR 72 | Temp 98.3°F | Ht 64.25 in | Wt 143.0 lb

## 2019-03-19 DIAGNOSIS — I10 Essential (primary) hypertension: Secondary | ICD-10-CM

## 2019-03-19 DIAGNOSIS — K5792 Diverticulitis of intestine, part unspecified, without perforation or abscess without bleeding: Secondary | ICD-10-CM

## 2019-03-19 DIAGNOSIS — R739 Hyperglycemia, unspecified: Secondary | ICD-10-CM

## 2019-03-19 DIAGNOSIS — F419 Anxiety disorder, unspecified: Secondary | ICD-10-CM

## 2019-03-19 MED ORDER — CEFTRIAXONE SODIUM 1 G IJ SOLR
1.0000 g | Freq: Once | INTRAMUSCULAR | Status: AC
  Administered 2019-03-19: 1 g via INTRAMUSCULAR

## 2019-03-19 MED ORDER — KETOROLAC TROMETHAMINE 30 MG/ML IJ SOLN
30.0000 mg | Freq: Once | INTRAMUSCULAR | Status: AC
  Administered 2019-03-19: 30 mg via INTRAMUSCULAR

## 2019-03-19 MED ORDER — CIPROFLOXACIN HCL 500 MG PO TABS: 500.0000 mg | ORAL_TABLET | Freq: Two times a day (BID) | ORAL | 0 refills | Status: AC

## 2019-03-19 MED ORDER — METRONIDAZOLE 500 MG PO TABS: 500.0000 mg | ORAL_TABLET | Freq: Two times a day (BID) | ORAL | 0 refills | Status: AC

## 2019-03-19 MED ORDER — TRAMADOL HCL 50 MG PO TABS: 50.0000 mg | ORAL_TABLET | Freq: Four times a day (QID) | ORAL | 0 refills | Status: DC | PRN

## 2019-03-19 NOTE — Progress Notes (Signed)
Subjective:    Patient ID: Lauren [name], female    DOB: 07-31-1945, 72 y.o.   MRN: [id number]  HPI  Here to f/u recent ED visit with LLQ pain and exam c/w acute simple diverticulitis oct 19; tx with augmentin and believes she was improving in the first 2 days, but has steadilly gotten worse since then, without fever or chills but the pain is not at least 8/10, without radiation. Denies worsening reflux, dysphagia, n/v, bowel change or blood.  As the pain was getting worse, she stopped all meds except for xanax, ambien, thyroid hoping this would help but did not.  Pt denies chest pain, increased sob or doe, wheezing, orthopnea, PND, increased LE swelling, palpitations, dizziness or syncope.  Pt denies new neurological symptoms such as new headache, or facial or extremity weakness or numbness   Pt denies polydipsia, polyuria Past Medical History:  Diagnosis Date  . Anxiety   . Arthritis   . Carotid stenosis 10/08/2013  . Cervical disc disease   . Depression   . Diverticulitis   . Diverticulosis   . Frequent headaches   . Hypertension   . Hypothyroidism 10/26/2015  . Impaired glucose tolerance 10/08/2013   normal HgA1C  . Neck pain   . Psoriasis    Past Surgical History:  Procedure Laterality Date  . BREAST BIOPSY Left 2011  . CERVICAL FUSION     approx 1980  . CHOLECYSTECTOMY    . POSTERIOR CERVICAL FUSION/FORAMINOTOMY N/A 11/05/2014   Procedure: Cervical four to Cervical seven  Posterior Cervical Fusion with lateral mass fixation;  Surgeon: Eustace Moore, MD;  Location: [facility];  Service: Neurosurgery;  Laterality: N/A;   C4 - C7 Posterior Cervical Fusion with lateral mass fixation  . TOTAL ABDOMINAL HYSTERECTOMY  1998   Fibroid    reports that she has never smoked. She has never used smokeless tobacco. She reports current alcohol use. She reports that she does not use drugs. family history includes Alcohol abuse in an other family member; Alcoholism in her father;  Arthritis in an other family member; Cancer in an other family member; Cervical cancer in her mother; Diabetes in her brother and mother; Heart attack in her mother; Heart disease in her mother and another family member; Hypertension in an other family member; Lung cancer in her father; Throat cancer in her brother. Allergies  Allergen Reactions  . Celebrex [Celecoxib] Itching and Rash   Current Outpatient Medications on File Prior to Visit  Medication Sig Dispense Refill  . ALPRAZolam (XANAX) 1 MG tablet 1/2  - 1 tab by mouth twice per day as needed (Patient taking differently: Take 0.5-1 mg by mouth 2 (two) times daily as needed for anxiety. ) 60 tablet 2  . aspirin EC 325 MG tablet Take 325 mg by mouth daily.    Marland Kitchen levothyroxine (SYNTHROID) 50 MCG tablet Take 1 tablet (50 mcg total) by mouth daily. 90 tablet 3  . zolpidem (AMBIEN) 5 MG tablet Take 1 tablet (5 mg total) by mouth at bedtime as needed. for sleep 90 tablet 1   No current facility-administered medications on file prior to visit.    Review of Systems  Constitutional: Negative for other unusual diaphoresis or sweats HENT: Negative for ear discharge or swelling Eyes: Negative for other worsening visual disturbances Respiratory: Negative for stridor or other swelling  Gastrointestinal: Negative for worsening distension or other blood Genitourinary: Negative for retention or other urinary change Musculoskeletal: Negative for other MSK pain  or swelling Skin: Negative for color change or other new lesions Neurological: Negative for worsening tremors and other numbness  Psychiatric/Behavioral: Negative for worsening agitation or other fatigue All otherwise neg per pt     Objective:   Physical Exam BP 114/76   Pulse 72   Temp 98.3 F (36.8 C) (Oral)   Ht 5' 4.25" (1.632 m)   Wt 143 lb (64.9 kg)   LMP 05/22/1996   SpO2 97%   BMI 24.36 kg/m  VS noted,  Constitutional: Pt appears in NAD HENT: Head: NCAT.  Right Ear:  External ear normal.  Left Ear: External ear normal.  Eyes: . Pupils are equal, round, and reactive to light. Conjunctivae and EOM are normal Nose: without d/c or deformity Neck: Neck supple. Gross normal ROM Cardiovascular: Normal rate and regular rhythm.   Pulmonary/Chest: Effort normal and breath sounds without rales or wheezing.  Abd:  Soft, ND, + BS, no organomegaly but marked tender LLQ without guarding or rebound Neurological: Pt is alert. At baseline orientation, motor grossly intact Skin: Skin is warm. No rashes, other new lesions, no LE edema Psychiatric: Pt behavior is normal without agitation  All otherwise neg per pt Lab Results  Component Value Date   WBC 10.5 03/10/2019   HGB 12.9 03/10/2019   HCT 39.4 03/10/2019   PLT 240 03/10/2019   GLUCOSE 96 03/10/2019   CHOL 163 12/23/2018   TRIG 301.0 (H) 12/23/2018   HDL 30.80 (L) 12/23/2018   LDLDIRECT 85.0 12/23/2018   LDLCALC 90 10/26/2017   ALT 22 03/10/2019   AST 43 (H) 03/10/2019   NA 135 03/10/2019   K 4.2 03/10/2019   CL 99 03/10/2019   CREATININE 0.85 03/10/2019   BUN 13 03/10/2019   CO2 25 03/10/2019   TSH 3.39 12/23/2018   INR 0.97 10/27/2014   HGBA1C 5.8 12/23/2018       Assessment & Plan:

## 2019-03-19 NOTE — Assessment & Plan Note (Signed)
stable overall by history and exam, recent data reviewed with pt, and pt to continue medical treatment as before,  to f/u any worsening symptoms or concerns  

## 2019-03-19 NOTE — Patient Instructions (Signed)
You had the antibiotic shot today (rocephin), and the pain shot (toradol)  OK to stop the augmentin (the amoxicillin antibiotic)  Please take all new medication as prescribed - the pain medication (tramadol), as well as 2 new pill antibiotics (cipro and flagyl)  Please continue all other medications as before, and refills have been done if requested.  Please have the pharmacy call with any other refills you may need.  Please keep your appointments with your specialists as you may have planned  Please go to the Emergency Room if you have sudden worsening pain or feel worse overall instead of better in the next 3 or more days

## 2019-03-19 NOTE — Assessment & Plan Note (Addendum)
Possibly better then worse again and appears to be failing outpt tx, declines ED f/u for now, ok for rocephin IM 1 gm, change augmentin to cipro and flagyl, tramadol prn, and should go to ED for any persistence or worsening even in the next 1-2 days  Note:  Total time for pt hx, exam, review of record with pt in the room, determination of diagnoses and plan for further eval and tx is > 40 min, with over 50% spent in coordination and counseling of patient including the differential dx, tx, further evaluation and other management of acute diverticulitis, hyperglycemia, HTN, anxiety

## 2019-03-19 NOTE — Assessment & Plan Note (Signed)
With mild situational worsening, cont same tx,  to f/u any worsening symptoms or concerns

## 2019-03-22 ENCOUNTER — Emergency Department (HOSPITAL_COMMUNITY)
Admission: EM | Admit: 2019-03-22 | Discharge: 2019-03-22 | Disposition: A | Discharge status: Home / Self Care | Payer: Medicare Other | Attending: Emergency Medicine | Admitting: Emergency Medicine

## 2019-03-22 ENCOUNTER — Encounter (HOSPITAL_COMMUNITY): Payer: Self-pay | Admitting: Emergency Medicine

## 2019-03-22 ENCOUNTER — Other Ambulatory Visit: Payer: Self-pay

## 2019-03-22 ENCOUNTER — Emergency Department (HOSPITAL_COMMUNITY): Payer: Medicare Other

## 2019-03-22 DIAGNOSIS — R109 Unspecified abdominal pain: Secondary | ICD-10-CM | POA: Diagnosis not present

## 2019-03-22 DIAGNOSIS — E039 Hypothyroidism, unspecified: Secondary | ICD-10-CM | POA: Insufficient documentation

## 2019-03-22 DIAGNOSIS — I10 Essential (primary) hypertension: Secondary | ICD-10-CM | POA: Diagnosis not present

## 2019-03-22 DIAGNOSIS — Z7982 Long term (current) use of aspirin: Secondary | ICD-10-CM | POA: Insufficient documentation

## 2019-03-22 DIAGNOSIS — Z79899 Other long term (current) drug therapy: Secondary | ICD-10-CM | POA: Diagnosis not present

## 2019-03-22 DIAGNOSIS — R1032 Left lower quadrant pain: Secondary | ICD-10-CM

## 2019-03-22 LAB — URINALYSIS, ROUTINE W REFLEX MICROSCOPIC
Bacteria, UA: NONE SEEN
Bilirubin Urine: NEGATIVE
Glucose, UA: NEGATIVE mg/dL
Hgb urine dipstick: NEGATIVE
Ketones, ur: NEGATIVE mg/dL
Nitrite: NEGATIVE
Protein, ur: NEGATIVE mg/dL
Specific Gravity, Urine: 1.019 (ref 1.005–1.030)
pH: 5 (ref 5.0–8.0)

## 2019-03-22 LAB — COMPREHENSIVE METABOLIC PANEL
ALT: 19 U/L (ref 0–44)
AST: 24 U/L (ref 15–41)
Albumin: 4.1 g/dL (ref 3.5–5.0)
Alkaline Phosphatase: 74 U/L (ref 38–126)
Anion gap: 9 (ref 5–15)
BUN: 15 mg/dL (ref 8–23)
CO2: 28 mmol/L (ref 22–32)
Calcium: 9.2 mg/dL (ref 8.9–10.3)
Chloride: 104 mmol/L (ref 98–111)
Creatinine, Ser: 0.82 mg/dL (ref 0.44–1.00)
GFR calc Af Amer: >60 mL/min (ref >60)
GFR calc non Af Amer: >60 mL/min (ref >60)
Glucose, Bld: 98 mg/dL (ref 70–99)
Potassium: 3.4 mmol/L — ABNORMAL LOW (ref 3.5–5.1)
Sodium: 141 mmol/L (ref 135–145)
Total Bilirubin: 0.6 mg/dL (ref 0.3–1.2)
Total Protein: 7.2 g/dL (ref 6.5–8.1)

## 2019-03-22 LAB — CBC
HCT: 39.6 % (ref 36.0–46.0)
Hemoglobin: 13 g/dL (ref 12.0–15.0)
MCH: 32.2 pg (ref 26.0–34.0)
MCHC: 32.8 g/dL (ref 30.0–36.0)
MCV: 98 fL (ref 80.0–100.0)
Platelets: 261 10*3/uL (ref 150–400)
RBC: 4.04 MIL/uL (ref 3.87–5.11)
RDW: 12.7 % (ref 11.5–15.5)
WBC: 7.6 10*3/uL (ref 4.0–10.5)
nRBC: 0 % (ref 0.0–0.2)

## 2019-03-22 LAB — LIPASE, BLOOD: Lipase: 60 U/L — ABNORMAL HIGH (ref 11–51)

## 2019-03-22 MED ORDER — LIDOCAINE 5 % EX PTCH: 1.0000 | MEDICATED_PATCH | CUTANEOUS | 0 refills | Status: DC

## 2019-03-22 MED ORDER — IOHEXOL 300 MG/ML  SOLN
100.0000 mL | Freq: Once | INTRAMUSCULAR | Status: AC | PRN
  Administered 2019-03-22: 100 mL via INTRAVENOUS

## 2019-03-22 MED ORDER — MORPHINE SULFATE (PF) 4 MG/ML IV SOLN
4.0000 mg | Freq: Once | INTRAVENOUS | Status: AC
  Administered 2019-03-22: 4 mg via INTRAVENOUS
  Filled 2019-03-22: qty 1

## 2019-03-22 MED ORDER — LIDOCAINE 5 % EX PTCH
1.0000 | MEDICATED_PATCH | CUTANEOUS | Status: DC
  Administered 2019-03-22: 20:00:00 1 via TRANSDERMAL
  Filled 2019-03-22: qty 1

## 2019-03-22 MED ORDER — SODIUM CHLORIDE 0.9% FLUSH
3.0000 mL | Freq: Once | INTRAVENOUS | Status: AC
  Administered 2019-03-22: 3 mL via INTRAVENOUS

## 2019-03-22 NOTE — ED Provider Notes (Signed)
[facility] EMERGENCY DEPARTMENT Provider Note   CSN: [id number] Arrival date & time: 03/22/19  1549     History   Chief Complaint Chief Complaint  Patient presents with  . Abdominal Pain    HPI Lauren [name] is a 73 y.o. female.     The history is provided by the patient and medical records. No language interpreter was used.  Abdominal Pain  Lauren [name] is a 73 y.o. female who presents to the Emergency Department complaining of abdominal pain. She presents to the emergency department complaining of two weeks of progressive left lower quadrant abdominal pain. Pain is described as sharp in nature. It waxes and wanes with no clear alleviating or worsening factors. It initiates in her right upper quadrant and settles and radiates to her left lower quadrant. She denies any fevers, nausea, vomiting. She does complain of diarrhea, unable to quantify. She was seen in the emergency department October 19 and started on Augmentin for diverticulitis following CT scan imaging. She states that since that time she has experienced progressive and worsening symptoms. She saw her PCP on Tuesday of this week in her medications were changed to ciprofloxacin and Flagyl. She has been compliant with his medications with significant worsening in her pain and states that she cannot tolerate the pain anymore. She lives at home with her husband and son. No known COVID 19 exposures. Past Medical History:  Diagnosis Date  . Anxiety   . Arthritis   . Carotid stenosis 10/08/2013  . Cervical disc disease   . Depression   . Diverticulitis   . Diverticulosis   . Frequent headaches   . Hypertension   . Hypothyroidism 10/26/2015  . Impaired glucose tolerance 10/08/2013   normal HgA1C  . Neck pain   . Psoriasis     Patient Active Problem List   Diagnosis Date Noted  . Hyperglycemia 12/23/2018  . Hematuria 12/23/2018  . Vitamin D deficiency 10/25/2017  . Rash 05/24/2017   . Obesity 05/24/2017  . Hypothyroidism 10/26/2015  . Peripheral edema 10/22/2015  . Cervical dystonia 07/14/2015  . Weakness generalized 11/16/2014  . Insomnia 11/16/2014  . S/P cervical spinal fusion 11/05/2014  . Preop exam for internal medicine 10/14/2014  . Essential hypertension 10/14/2014  . Fracture, cervical vertebra ([place]) 08/30/2014  . Encounter for well adult exam with abnormal findings 10/08/2013  . Carotid stenosis 10/08/2013  . Depression   . Diverticulitis   . Anxiety   . Psoriasis   . Cervical disc disease   . Diverticulitis of colon (without mention of hemorrhage)(562.11) 05/10/2013    Past Surgical History:  Procedure Laterality Date  . BREAST BIOPSY Left 2011  . CERVICAL FUSION     approx 1980  . CHOLECYSTECTOMY    . POSTERIOR CERVICAL FUSION/FORAMINOTOMY N/A 11/05/2014   Procedure: Cervical four to Cervical seven  Posterior Cervical Fusion with lateral mass fixation;  Surgeon: Eustace Moore, MD;  Location: [facility];  Service: Neurosurgery;  Laterality: N/A;   C4 - C7 Posterior Cervical Fusion with lateral mass fixation  . TOTAL ABDOMINAL HYSTERECTOMY  1998   Fibroid     OB History    Gravida  2   Para  2   Term  2   Preterm  0   AB  0   Living  2     SAB  0   TAB  0   Ectopic  0   Multiple  0   Live Births  2            Home Medications    Prior to Admission medications   Medication Sig Start Date End Date Taking? Authorizing Provider  ALPRAZolam Duanne Moron) 1 MG tablet 1/2  - 1 tab by mouth twice per day as needed Patient taking differently: Take 0.5-1 mg by mouth 2 (two) times daily as needed for anxiety.  12/23/18   Biagio Borg, MD  aspirin EC 325 MG tablet Take 325 mg by mouth daily.    [provider]  ciprofloxacin (CIPRO) 500 MG tablet Take 1 tablet (500 mg total) by mouth 2 (two) times daily for 10 days. 03/19/19 03/29/19  Biagio Borg, MD  levothyroxine (SYNTHROID) 50 MCG tablet Take 1 tablet (50 mcg total) by  mouth daily. 12/23/18   Biagio Borg, MD  lidocaine (LIDODERM) 5 % Place 1 patch onto the skin daily. Remove & Discard patch within 12 hours or as directed by MD 03/22/19   Quintella Reichert, MD  metroNIDAZOLE (FLAGYL) 500 MG tablet Take 1 tablet (500 mg total) by mouth 2 (two) times daily for 10 days. 03/19/19 03/29/19  Biagio Borg, MD  traMADol (ULTRAM) 50 MG tablet Take 1 tablet (50 mg total) by mouth every 6 (six) hours as needed. 03/19/19   Biagio Borg, MD  zolpidem (AMBIEN) 5 MG tablet Take 1 tablet (5 mg total) by mouth at bedtime as needed. for sleep 12/23/18   Biagio Borg, MD    Family History Family History  Problem Relation Age of Onset  . Heart disease Mother   . Diabetes Mother   . Cervical cancer Mother   . Heart attack Mother   . Lung cancer Father   . Alcoholism Father   . Diabetes Brother   . Throat cancer Brother   . Cancer Other        lung cancer  . Heart disease Other   . Hypertension Other   . Arthritis Other   . Alcohol abuse Other     Social History Social History   Tobacco Use  . Smoking status: Never Smoker  . Smokeless tobacco: Never Used  Substance Use Topics  . Alcohol use: Yes    Alcohol/week: 0.0 standard drinks    Comment: 1 a month  . Drug use: No     Allergies   Celebrex [celecoxib]   Review of Systems Review of Systems  Gastrointestinal: Positive for abdominal pain.  All other systems reviewed and are negative.    Physical Exam Updated Vital Signs BP 138/70   Pulse 71   Temp 97.9 F (36.6 C) (Oral)   Resp 14   LMP 05/22/1996   SpO2 99%   Physical Exam Vitals signs and nursing note reviewed.  Constitutional:      Appearance: She is well-developed.  HENT:     Head: Normocephalic and atraumatic.  Cardiovascular:     Rate and Rhythm: Normal rate and regular rhythm.     Heart sounds: No murmur.  Pulmonary:     Effort: Pulmonary effort is normal. No respiratory distress.     Breath sounds: Normal breath sounds.   Abdominal:     Palpations: Abdomen is soft.     Tenderness: There is no guarding or rebound.     Comments: Moderate left lower quadrant tenderness without guarding or rebound.  Musculoskeletal:        General: No tenderness.  Skin:    General: Skin is warm and dry.  Neurological:     Mental Status: She is alert and oriented to person, place, and time.  Psychiatric:     Comments: Anxious, tearful      ED Treatments / Results  Labs (all labs ordered are listed, but only abnormal results are displayed) Labs Reviewed  LIPASE, BLOOD - Abnormal; Notable for the following components:      Result Value   Lipase 60 (*)    All other components within normal limits  COMPREHENSIVE METABOLIC PANEL - Abnormal; Notable for the following components:   Potassium 3.4 (*)    All other components within normal limits  URINALYSIS, ROUTINE W REFLEX MICROSCOPIC - Abnormal; Notable for the following components:   Leukocytes,Ua TRACE (*)    All other components within normal limits  CBC    EKG None  Radiology Ct Abdomen Pelvis W Contrast  Result Date: 03/22/2019 CLINICAL DATA:  Abdominal pain, LEFT lower quadrant pain across abdomen question diverticulitis; past history of diverticulitis and hypertension EXAM: CT ABDOMEN AND PELVIS WITH CONTRAST TECHNIQUE: Multidetector CT imaging of the abdomen and pelvis was performed using the standard protocol following bolus administration of intravenous contrast. Sagittal and coronal MPR images reconstructed from axial data set. CONTRAST:  178mL OMNIPAQUE IOHEXOL 300 MG/ML SOLN IV. No oral contrast. COMPARISON:  03/10/2019 FINDINGS: Lower chest: Dependent bibasilar atelectasis Hepatobiliary: Gallbladder surgically absent. Mild focal fatty infiltration of liver adjacent to falciform fissure. Liver otherwise normal appearance Pancreas: Normal appearance Spleen: Normal appearance Adrenals/Urinary Tract: Adrenal glands normal appearance. Kidneys, ureters, and  bladder normal appearance Stomach/Bowel: Normal appendix, retrocecal. Diverticulosis changes greatest at sigmoid colon. No colonic wall thickening or pericolic inflammatory changes to suggest acute diverticulitis. Small diverticulum at third portion of duodenum. Stomach and bowel loops otherwise normal appearance. Vascular/Lymphatic: Atherosclerotic calcifications aorta. No adenopathy. Reproductive: Uterus surgically absent. Nonvisualization of ovaries. Other: No free air or free fluid. No hernia or acute inflammatory process. Musculoskeletal: Mild degenerative disc disease changes L4-L5. IMPRESSION: Colonic diverticulosis greatest at sigmoid colon without evidence of acute diverticulitis. Remainder of exam unremarkable. Electronically Signed   By: Lavonia Dana M.D.   On: 03/22/2019 18:42    Procedures Procedures (including critical care time)  Medications Ordered in ED Medications  sodium chloride flush (NS) 0.9 % injection 3 mL (3 mLs Intravenous Given 03/22/19 1728)  morphine 4 MG/ML injection 4 mg (4 mg Intravenous Given 03/22/19 1727)  iohexol (OMNIPAQUE) 300 MG/ML solution 100 mL (100 mLs Intravenous Contrast Given 03/22/19 1824)     Initial Impression / Assessment and Plan / ED Course  I have reviewed the triage vital signs and the nursing notes.  Pertinent labs & imaging results that were available during my care of the patient were reviewed by me and considered in my medical decision making (see chart for details).        Patient here for evaluation of abdominal pain, recent diagnosis of diverticulitis and currently on antibiotics. She has tenderness on examination without peritoneal findings. Labs significant for mild hypokalemia, no leukocytosis. CT abdomen pelvis without evidence of diverticulitis or complicating features. Patient is feeling improved on repeat assessment. Counseled patient on home care for abdominal pain, unclear etiology. Discussed continuing her antibiotics until  they are completed. Discussed PCP and G.I. follow-up as well as return precautions. Final Clinical Impressions(s) / ED Diagnoses   Final diagnoses:  Left lower quadrant abdominal pain    ED Discharge Orders         Ordered    lidocaine (LIDODERM) 5 %  Every 24 hours,   Status:  Discontinued     03/22/19 1955    lidocaine (LIDODERM) 5 %  Every 24 hours     03/22/19 2021           Quintella Reichert, MD 03/22/19 2351

## 2019-03-22 NOTE — ED Triage Notes (Signed)
C/o LLQ pain that radiates across lower abd for over 1 week.  Pt tearful and states her body is telling her it is more than diverticulitis.  Questioned when symptoms started and pt asked that we look up previous information that she gave on prior visits instead of asking questions about things she has already answered.  Denies nausea and vomiting.

## 2019-03-24 NOTE — Progress Notes (Signed)
03/24/2019 Lauren [name] [id number] 11/30/1945   HISTORY OF PRESENT ILLNESS: Mr. Lauren [name] is a 73 year old female with a past medical history of anxiety, depression, hypertension, carotid stenosis, cervical disc disease and uncomplicated diverticulitis in 2014.  Past hysterectomy, cholecystectomy and cervical spine surgery x 2. She developed LLQ abdominal pain and she presented to [facility] ED on 03/10/2019. An abdominal/pelvic CT confirmed proximal sigmoid diverticulitis. She was treated with Augmentin 875 mg 1 p.o. twice daily for 10 days.  WBC 10.5. She was seen in office by Dr. Cathlean Cower on 10/28 with persistent LLQ abdominal pain. She received Rocephin IM 1 gm and Augmentin was discontinued. She was started on Cipro and Flagyl, Tramadol prn with instructions to go to ED if her symptoms persisted or worsened. Her LLQ abdominal pain worsened so she presented to [facility] ED on 03/22/2019. WBC 7.6. A repeat abdominal/pelvic CT showed sigmoid diverticulosis without evidence of diverticulosis.  She presents today for further GI evaluation.  She stated her diverticulitis left lower quadrant abdominal pain has abated.  However, she has lower abdominal cramping prior to passing a bowel movement which is new.  This discomfort abates after she passes a bowel movement.  She is having 3 soft formed bowel movements daily for the past week which is also a change. She stated her stools are darker brown since taking the antibiotics. Stools are not black.  She typically passes a bit of solid bowel movement once daily, sometimes she skips a day.  No rectal bleeding or melena. She underwent a colonoscopy 08/20/2009 by Dr. Olevia Perches which identified severe sigmoid diverticulosis, narrow spastic sigmoid colon noted, scope was switched to a pediatric scope. Colonoscopy 08/16/1999 diverticulosis, no polyps.  Her father had diverticular disease which required surgery.  No family history of colorectal cancer.   CBC  Latest Ref Rng & Units 03/22/2019 03/10/2019 12/23/2018  WBC 4.0 - 10.5 K/uL 7.6 10.5 6.8  Hemoglobin 12.0 - 15.0 g/dL 13.0 12.9 12.7  Hematocrit 36.0 - 46.0 % 39.6 39.4 37.4  Platelets 150 - 400 K/uL 261 240 227.0   CMP Latest Ref Rng & Units 03/22/2019 03/10/2019 12/23/2018  Glucose 70 - 99 mg/dL 98 96 89  BUN 8 - 23 mg/dL 15 13 16   Creatinine 0.44 - 1.00 mg/dL 0.82 0.85 1.01  Sodium 135 - 145 mmol/L 141 135 142  Potassium 3.5 - 5.1 mmol/L 3.4(L) 4.2 3.7  Chloride 98 - 111 mmol/L 104 99 103  CO2 22 - 32 mmol/L 28 25 30   Calcium 8.9 - 10.3 mg/dL 9.2 9.2 9.5  Total Protein 6.5 - 8.1 g/dL 7.2 7.0 7.1  Total Bilirubin 0.3 - 1.2 mg/dL 0.6 0.7 0.5  Alkaline Phos 38 - 126 U/L 74 75 66  AST 15 - 41 U/L 24 43(H) 20  ALT 0 - 44 U/L 19 22 16    Abdominal/pelvic CT with IV contrast 03/22/2019: Colonic diverticulosis greatest at sigmoid colon without evidence of acute diverticulitis. Remainder of exam unremarkable.  Abd/pelvic CT with IV contrast 03/10/2019: Examination is positive for mild uncomplicated diverticulitis involving the proximal sigmoid colon. No free perforation or abscess. Increase caliber of the common bile duct status post cholecystectomy. No significant pancreatic inflammatory changes identified. No complications of pancreatitis noted. Aortic Atherosclerosis   Past Medical History:  Diagnosis Date  . Anxiety   . Arthritis   . Carotid stenosis 10/08/2013  . Cervical disc disease   . Depression   . Diverticulitis   . Diverticulosis   .  Frequent headaches   . Hypertension   . Hypothyroidism 10/26/2015  . Impaired glucose tolerance 10/08/2013   normal HgA1C  . Neck pain   . Psoriasis    Past Surgical History:  Procedure Laterality Date  . BREAST BIOPSY Left 2011  . CERVICAL FUSION     approx 1980  . CHOLECYSTECTOMY    . POSTERIOR CERVICAL FUSION/FORAMINOTOMY N/A 11/05/2014   Procedure: Cervical four to Cervical seven  Posterior Cervical Fusion with lateral  mass fixation;  Surgeon: Eustace Moore, MD;  Location: [facility];  Service: Neurosurgery;  Laterality: N/A;   C4 - C7 Posterior Cervical Fusion with lateral mass fixation  . TOTAL ABDOMINAL HYSTERECTOMY  1998   Fibroid    reports that she has never smoked. She has never used smokeless tobacco. She reports current alcohol use. She reports that she does not use drugs. family history includes Alcohol abuse in an other family member; Alcoholism in her father; Arthritis in an other family member; Cancer in an other family member; Cervical cancer in her mother; Diabetes in her brother and mother; Heart attack in her mother; Heart disease in her mother and another family member; Hypertension in an other family member; Lung cancer in her father; Throat cancer in her brother. Allergies  Allergen Reactions  . Celebrex [Celecoxib] Itching and Rash      Outpatient Encounter Medications as of 03/25/2019  Medication Sig  . ALPRAZolam (XANAX) 1 MG tablet 1/2  - 1 tab by mouth twice per day as needed (Patient taking differently: Take 0.5-1 mg by mouth 2 (two) times daily as needed for anxiety. )  . aspirin EC 325 MG tablet Take 325 mg by mouth daily.  . ciprofloxacin (CIPRO) 500 MG tablet Take 1 tablet (500 mg total) by mouth 2 (two) times daily for 10 days.  Marland Kitchen levothyroxine (SYNTHROID) 50 MCG tablet Take 1 tablet (50 mcg total) by mouth daily.  Marland Kitchen lidocaine (LIDODERM) 5 % Place 1 patch onto the skin daily. Remove & Discard patch within 12 hours or as directed by MD  . metroNIDAZOLE (FLAGYL) 500 MG tablet Take 1 tablet (500 mg total) by mouth 2 (two) times daily for 10 days.  . traMADol (ULTRAM) 50 MG tablet Take 1 tablet (50 mg total) by mouth every 6 (six) hours as needed.  . zolpidem (AMBIEN) 5 MG tablet Take 1 tablet (5 mg total) by mouth at bedtime as needed. for sleep   No facility-administered encounter medications on file as of 03/25/2019.    Family History  Problem Relation Age of Onset  . Heart  disease Mother   . Diabetes Mother   . Cervical cancer Mother   . Heart attack Mother   . Lung cancer Father   . Alcoholism Father   . Diabetes Brother   . Throat cancer Brother   . Cancer Other        lung cancer  . Heart disease Other   . Hypertension Other   . Arthritis Other   . Alcohol abuse Other    Social History   Socioeconomic History  . Marital status: Married    Spouse name: Not on file  . Number of children: 2  . Years of education: 69  . Highest education level: Not on file  Occupational History  . Occupation: retired  Scientific laboratory technician  . Financial resource strain: Not on file  . Food insecurity    Worry: Not on file    Inability: Not on file  .  Transportation needs    Medical: Not on file    Non-medical: Not on file  Tobacco Use  . Smoking status: Never Smoker  . Smokeless tobacco: Never Used  Substance and Sexual Activity  . Alcohol use: Yes    Alcohol/week: 0.0 standard drinks    Comment: 1 a month  . Drug use: No  . Sexual activity: Yes    Partners: Male    Birth control/protection: Surgical    Comment: Hyst  Lifestyle  . Physical activity    Days per week: Not on file    Minutes per session: Not on file  . Stress: Not on file  Relationships  . Social Herbalist on phone: Not on file    Gets together: Not on file    Attends religious service: Not on file    Active member of club or organization: Not on file    Attends meetings of clubs or organizations: Not on file    Relationship status: Not on file  . Intimate partner violence    Fear of current or ex partner: Not on file    Emotionally abused: Not on file    Physically abused: Not on file    Forced sexual activity: Not on file  Other Topics Concern  . Not on file  Social History Narrative   Lives at home with husband and son.   Right-handed.   2 cups caffeine daily.    REVIEW OF SYSTEMS  : All other systems reviewed and negative except where noted in the History of  Present Illness.   PHYSICAL EXAM: LMP 05/22/1996  General: Well developed white female in no acute distress Head: Normocephalic and atraumatic Eyes:  sclerae anicteric,conjunctive pink. Ears: Normal auditory acuity Neck: Supple, no masses.  Lungs: Clear throughout to auscultation Heart: Regular rate and rhythm Abdomen: Soft, nontender, non distended. No masses or hepatomegaly noted. Normal bowel sounds.  Lower abdominal horizontal scar intact. Rectal: Deferred. Musculoskeletal: Symmetrical with no gross deformities  Skin: No lesions on visible extremities Extremities: No edema  Neurological: Alert oriented x 4, grossly nonfocal Cervical Nodes:  No significant cervical adenopathy Psychological:  Alert and cooperative. Normal mood and affect  ASSESSMENT AND PLAN:  32. 73 year old female with proximal sigmoid diverticulitis 02/2019 -schedule a colonoscopy in 6 to 8 weeks, she elects to schedule her colonoscopy with Dr. Carlean Purl as he is her husband's gastroenterologist. Pediatric scope to be available (colonoscopy by Dr Olevia Perches in 2011 identified severe sigmoid diverticulosis, narrow spastic sigmoid colon noted, scope was switched to a pediatric scope).  -patient to call office if her LLQ abdominal pain recurs  -Complete full course of Cipro and Flagyl as prescribed by her PCP  2.  Altered bowel pattern with mild lower abdominal cramping prior to defecation since taking Cipro and Flagyl for diverticulitis -Patient to monitor her bowel pattern and mild lower abdominal cramping when she is off of the antibiotics -Recommended probiotic of choice once daily -Dicyclomine 10 mg 1 p.o. twice daily as needed   Further follow-up to be determined after colonoscopy completed     CC:  Biagio Borg, MD

## 2019-03-25 ENCOUNTER — Encounter: Payer: Self-pay | Admitting: Nurse Practitioner

## 2019-03-25 ENCOUNTER — Other Ambulatory Visit: Payer: Self-pay

## 2019-03-25 ENCOUNTER — Ambulatory Visit: Payer: Medicare Other | Admitting: Nurse Practitioner

## 2019-03-25 VITALS — BP 116/62 | HR 72 | Temp 97.8°F | Ht 64.5 in | Wt 143.2 lb

## 2019-03-25 DIAGNOSIS — Z1159 Encounter for screening for other viral diseases: Secondary | ICD-10-CM | POA: Diagnosis not present

## 2019-03-25 DIAGNOSIS — R109 Unspecified abdominal pain: Secondary | ICD-10-CM

## 2019-03-25 DIAGNOSIS — K5792 Diverticulitis of intestine, part unspecified, without perforation or abscess without bleeding: Secondary | ICD-10-CM | POA: Diagnosis not present

## 2019-03-25 MED ORDER — DICYCLOMINE HCL 10 MG PO CAPS: 10.0000 mg | ORAL_CAPSULE | Freq: Two times a day (BID) | ORAL | 0 refills | Status: DC | PRN

## 2019-03-25 NOTE — Patient Instructions (Signed)
You have been scheduled for a colonoscopy. Please follow written instructions given to you at your visit today.  Please pick up your prep supplies at the pharmacy within the next 1-3 days. If you use inhalers (even only as needed), please bring them with you on the day of your procedure.   We have sent the following medications to your pharmacy for you to pick up at your convenience: Dicyclomine   Call our office if your abdominal pain recurs.   I appreciate the opportunity to care for you. Lauren [name]

## 2019-03-27 ENCOUNTER — Telehealth: Payer: Self-pay | Admitting: Internal Medicine

## 2019-03-27 NOTE — Telephone Encounter (Signed)
Pt has been informed that we do not have sample patches.   She stated that the pharmacy advised her to have a generic lidocaine patch sent in. I informed her without [place] knowing exactly what her insurance company would cover it would be a guessing game for PCP to send something in. I suggested that she check with her insurance company for generic medication names. She asked if I could call and I stated that I could not. She replied "Cute. I'm done with you too." and she proceeded to hang up.

## 2019-03-27 NOTE — Telephone Encounter (Signed)
lidocaine (LIDODERM) 5 %    Patient inquired if office has any samples of patches. Please advise.

## 2019-04-13 ENCOUNTER — Other Ambulatory Visit: Payer: Self-pay | Admitting: Internal Medicine

## 2019-04-16 DIAGNOSIS — D4981 Neoplasm of unspecified behavior of retina and choroid: Secondary | ICD-10-CM | POA: Diagnosis not present

## 2019-04-16 DIAGNOSIS — H2513 Age-related nuclear cataract, bilateral: Secondary | ICD-10-CM | POA: Diagnosis not present

## 2019-04-29 ENCOUNTER — Other Ambulatory Visit: Payer: Self-pay | Admitting: Internal Medicine

## 2019-04-29 NOTE — Telephone Encounter (Signed)
Done erx 

## 2019-05-07 ENCOUNTER — Encounter: Payer: Self-pay | Admitting: Internal Medicine

## 2019-05-08 ENCOUNTER — Other Ambulatory Visit: Payer: Self-pay | Admitting: Internal Medicine

## 2019-05-08 ENCOUNTER — Ambulatory Visit (INDEPENDENT_AMBULATORY_CARE_PROVIDER_SITE_OTHER): Payer: Medicare Other

## 2019-05-08 DIAGNOSIS — Z1159 Encounter for screening for other viral diseases: Secondary | ICD-10-CM

## 2019-05-08 LAB — SARS CORONAVIRUS 2 (TAT 6-24 HRS): SARS Coronavirus 2: NEGATIVE

## 2019-05-12 ENCOUNTER — Encounter: Payer: Self-pay | Admitting: Internal Medicine

## 2019-05-12 ENCOUNTER — Other Ambulatory Visit: Payer: Self-pay

## 2019-05-12 ENCOUNTER — Ambulatory Visit (AMBULATORY_SURGERY_CENTER): Payer: Medicare Other | Admitting: Internal Medicine

## 2019-05-12 VITALS — BP 127/69 | HR 70 | Temp 97.9°F | Resp 11 | Ht 64.5 in | Wt 143.0 lb

## 2019-05-12 DIAGNOSIS — K5792 Diverticulitis of intestine, part unspecified, without perforation or abscess without bleeding: Secondary | ICD-10-CM | POA: Diagnosis not present

## 2019-05-12 DIAGNOSIS — K5732 Diverticulitis of large intestine without perforation or abscess without bleeding: Secondary | ICD-10-CM | POA: Diagnosis not present

## 2019-05-12 MED ORDER — SODIUM CHLORIDE 0.9 % IV SOLN: 500.0000 mL | Freq: Once | INTRAVENOUS | Status: DC

## 2019-05-12 NOTE — Patient Instructions (Addendum)
No polyps or cancer seen.  Saw the diverticulosis. Hopefully no more diverticulitis.  I appreciate the opportunity to care for you. Gatha Mayer, MD, FACG  YOU HAD AN ENDOSCOPIC PROCEDURE TODAY AT [facility]:   Refer to the procedure report that was given to you for any specific questions about what was found during the examination.  If the procedure report does not answer your questions, please call your gastroenterologist to clarify.  If you requested that your care partner not be given the details of your procedure findings, then the procedure report has been included in a sealed envelope for you to review at your convenience later.  YOU SHOULD EXPECT: Some feelings of bloating in the abdomen. Passage of more gas than usual.  Walking can help get rid of the air that was put into your GI tract during the procedure and reduce the bloating. If you had a lower endoscopy (such as a colonoscopy or flexible sigmoidoscopy) you may notice spotting of blood in your stool or on the toilet paper. If you underwent a bowel prep for your procedure, you may not have a normal bowel movement for a few days.  Please Note:  You might notice some irritation and congestion in your nose or some drainage.  This is from the oxygen used during your procedure.  There is no need for concern and it should clear up in a day or so.  SYMPTOMS TO REPORT IMMEDIATELY:   Following lower endoscopy (colonoscopy or flexible sigmoidoscopy):  Excessive amounts of blood in the stool  Significant tenderness or worsening of abdominal pains  Swelling of the abdomen that is new, acute  Fever of 100F or higher   For urgent or emergent issues, a gastroenterologist can be reached at any hour by calling [phone].   DIET:  We do recommend a small meal at first, but then you may proceed to your regular diet.  Drink plenty of fluids but you should avoid alcoholic beverages for 24 hours.  ACTIVITY:  You should  plan to take it easy for the rest of today and you should NOT DRIVE or use heavy machinery until tomorrow (because of the sedation medicines used during the test).    FOLLOW UP: Our staff will call the number listed on your records 48-72 hours following your procedure to check on you and address any questions or concerns that you may have regarding the information given to you following your procedure. If we do not reach you, we will leave a message.  We will attempt to reach you two times.  During this call, we will ask if you have developed any symptoms of COVID 19. If you develop any symptoms (ie: fever, flu-like symptoms, shortness of breath, cough etc.) before then, please call [phone].  If you test positive for Covid 19 in the 2 weeks post procedure, please call and report this information to [place].    If any biopsies were taken you will be contacted by phone or by letter within the next 1-3 weeks.  Please call us at [phone] if you have not heard about the biopsies in 3 weeks.    SIGNATURES/CONFIDENTIALITY: You and/or your care partner have signed paperwork which will be entered into your electronic medical record.  These signatures attest to the fact that that the information above on your After Visit Summary has been reviewed and is understood.  Full responsibility of the confidentiality of this discharge information lies with you and/or your  care-partner.

## 2019-05-12 NOTE — Progress Notes (Signed)
Temp JB  VS CW  Pt's states no medical or surgical changes since previsit or office visit.  Admitting RN reviewed   Is taking a fluid pill but does not know what it is

## 2019-05-12 NOTE — Progress Notes (Signed)
A/ox3, pleased with MAC, report to RN 

## 2019-05-12 NOTE — Op Note (Signed)
[place] Patient Name: Lauren [name] Procedure Date: 05/12/2019 9:42 AM MRN: [id number] Endoscopist: Gatha Mayer , MD Age: 73 Referring MD:  Date of Birth: [birth year] Gender: Female Account #: [id number] Procedure:                Colonoscopy Indications:              Follow-up of diverticulitis Medicines:                Propofol per Anesthesia, Monitored Anesthesia Care Procedure:                Pre-Anesthesia Assessment:                           - Prior to the procedure, a History and Physical                            was performed, and patient medications and                            allergies were reviewed. The patient's tolerance of                            previous anesthesia was also reviewed. The risks                            and benefits of the procedure and the sedation                            options and risks were discussed with the patient.                            All questions were answered, and informed consent                            was obtained. Prior Anticoagulants: The patient has                            taken no previous anticoagulant or antiplatelet                            agents. ASA Grade Assessment: II - A patient with                            mild systemic disease. After reviewing the risks                            and benefits, the patient was deemed in                            satisfactory condition to undergo the procedure.                           After obtaining informed consent, the colonoscope  was passed under direct vision. Throughout the                            procedure, the patient's blood pressure, pulse, and                            oxygen saturations were monitored continuously. The                            Colonoscope was introduced through the anus and                            advanced to the the cecum, identified by                            appendiceal  orifice and ileocecal valve. The                            colonoscopy was performed without difficulty. The                            patient tolerated the procedure well. The quality                            of the bowel preparation was good. The ileocecal                            valve, appendiceal orifice, and rectum were                            photographed. The bowel preparation used was                            Miralax via split dose instruction. Scope In: 9:45:45 AM Scope Out: 10:00:03 AM Scope Withdrawal Time: 0 hours 10 minutes 25 seconds  Total Procedure Duration: 0 hours 14 minutes 18 seconds  Findings:                 The perianal and digital rectal examinations were                            normal.                           Many diverticula were found in the sigmoid colon.                            There was narrowing of the colon in association                            with the diverticular opening.                           The exam was otherwise without abnormality on  direct and retroflexion views. Complications:            No immediate complications. Estimated Blood Loss:     Estimated blood loss: none. Impression:               - Severe diverticulosis in the sigmoid colon. There                            was narrowing of the colon in association with the                            diverticular opening.                           - The examination was otherwise normal on direct                            and retroflexion views.                           - No specimens collected. Recommendation:           - Patient has a contact number available for                            emergencies. The signs and symptoms of potential                            delayed complications were discussed with the                            patient. Return to normal activities tomorrow.                            Written discharge instructions  were provided to the                            patient.                           - Resume previous diet.                           - Continue present medications.                           - No repeat colonoscopy due to age. Gatha Mayer, MD 05/12/2019 10:05:02 AM This report has been signed electronically.

## 2019-05-14 ENCOUNTER — Telehealth: Payer: Self-pay

## 2019-05-14 NOTE — Telephone Encounter (Signed)
  Follow up Call-  Call back number 05/12/2019  Post procedure Call Back phone  # [phone]  Permission to leave phone message Yes  Some recent data might be hidden     Patient questions:  Do you have a fever, pain , or abdominal swelling? No. Pain Score  0 *  Have you tolerated food without any problems? Yes.    Have you been able to return to your normal activities? Yes.    Do you have any questions about your discharge instructions: Diet   No. Medications  No. Follow up visit  No.  Do you have questions or concerns about your Care? No.  Actions: * If pain score is 4 or above: No action needed, pain <4.  1. Have you developed a fever since your procedure? no  2.   Have you had an respiratory symptoms (SOB or cough) since your procedure? no  3.   Have you tested positive for COVID 19 since your procedure no  4.   Have you had any family members/close contacts diagnosed with the COVID 19 since your procedure?  no   If yes to any of these questions please route to Joylene John, RN and Alphonsa Gin, Therapist, sports.

## 2019-06-17 ENCOUNTER — Other Ambulatory Visit: Payer: Self-pay

## 2019-06-17 ENCOUNTER — Ambulatory Visit
Admission: RE | Admit: 2019-06-17 | Discharge: 2019-06-17 | Disposition: A | Discharge status: Home / Self Care | Payer: Medicare Other | Source: Ambulatory Visit | Attending: Internal Medicine | Admitting: Internal Medicine

## 2019-06-17 DIAGNOSIS — Z1231 Encounter for screening mammogram for malignant neoplasm of breast: Secondary | ICD-10-CM

## 2019-06-18 ENCOUNTER — Telehealth: Payer: Self-pay | Admitting: Internal Medicine

## 2019-06-18 MED ORDER — DICYCLOMINE HCL 10 MG PO CAPS: 10.0000 mg | ORAL_CAPSULE | Freq: Three times a day (TID) | ORAL | 1 refills | Status: DC | PRN

## 2019-06-18 NOTE — Telephone Encounter (Signed)
Patient informed. 

## 2019-06-18 NOTE — Telephone Encounter (Signed)
Rx sent to [facility]  Let her know please

## 2019-06-18 NOTE — Telephone Encounter (Signed)
Pt is requesting some medication for diverticulosis, she states that she is having a flare up. Her pharmacy is [facility].

## 2019-06-18 NOTE — Telephone Encounter (Signed)
I spoke with Lauren [name] and she said yesterday she started with left sided abdominal pain, no fever, no Nausea or vomiting, no diarrhea. She started medicine that Dr Jenny Reichmann gave her in Oct, a blue capsule , took last one today. She will call me back with the name as she was driving and not at the house.

## 2019-06-18 NOTE — Telephone Encounter (Signed)
The blue capsule she has one more of is dicyclomine.

## 2019-06-19 ENCOUNTER — Ambulatory Visit: Payer: Medicare Other

## 2019-06-19 MED ORDER — AMOXICILLIN-POT CLAVULANATE 875-125 MG PO TABS: 1.0000 | ORAL_TABLET | Freq: Two times a day (BID) | ORAL | 0 refills | Status: DC

## 2019-06-19 NOTE — Telephone Encounter (Signed)
Patient is calling states she understood yesterday that she was suppose have two medications sent in to pharmacy. She only received one.

## 2019-06-19 NOTE — Addendum Note (Signed)
Addended by: [place], Chelise Hanger E on: 06/19/2019 03:34 PM   Modules accepted: Orders

## 2019-06-19 NOTE — Telephone Encounter (Signed)
I think there may have been a misunderstanding yesterday. Patient wants medicine for a flare.

## 2019-06-19 NOTE — Telephone Encounter (Signed)
If Lauren [name] thinks she is having a diverticulitis flare the send in Augmentin 875 mg bid x 10 days

## 2019-06-19 NOTE — Telephone Encounter (Signed)
I have called Mrs Spruill and told her this has been sent in.

## 2019-06-28 ENCOUNTER — Ambulatory Visit: Payer: Medicare Other | Attending: Internal Medicine

## 2019-06-28 DIAGNOSIS — Z23 Encounter for immunization: Secondary | ICD-10-CM | POA: Insufficient documentation

## 2019-06-28 NOTE — Progress Notes (Signed)
   Covid-19 Vaccination Clinic  Name:  Lauren [name]    MRN: [id number] DOB: 06/24/1945  06/28/2019  Lauren [name] was observed post Covid-19 immunization for 15 minutes without incidence. She was provided with Vaccine Information Sheet and instruction to access the V-Safe system.   Lauren [name] was instructed to call 911 with any severe reactions post vaccine: Marland Kitchen Difficulty breathing  . Swelling of your face and throat  . A fast heartbeat  . A bad rash all over your body  . Dizziness and weakness    Immunizations Administered    Name Date Dose VIS Date Route   Pfizer COVID-19 Vaccine 06/28/2019  8:38 AM 0.3 mL 05/02/2019 Intramuscular   Manufacturer: [place]   Lot: [id number]   [place]: [id number]

## 2019-06-30 ENCOUNTER — Ambulatory Visit: Payer: Medicare Other

## 2019-06-30 ENCOUNTER — Telehealth: Payer: Self-pay | Admitting: Internal Medicine

## 2019-06-30 ENCOUNTER — Other Ambulatory Visit: Payer: Self-pay

## 2019-06-30 ENCOUNTER — Other Ambulatory Visit: Payer: Self-pay | Admitting: Internal Medicine

## 2019-06-30 ENCOUNTER — Ambulatory Visit (HOSPITAL_COMMUNITY)
Admission: RE | Admit: 2019-06-30 | Discharge: 2019-06-30 | Disposition: A | Discharge status: Home / Self Care | Payer: Medicare Other | Source: Ambulatory Visit | Attending: Internal Medicine | Admitting: Internal Medicine

## 2019-06-30 DIAGNOSIS — K5732 Diverticulitis of large intestine without perforation or abscess without bleeding: Secondary | ICD-10-CM | POA: Diagnosis not present

## 2019-06-30 DIAGNOSIS — R1032 Left lower quadrant pain: Secondary | ICD-10-CM

## 2019-06-30 DIAGNOSIS — R109 Unspecified abdominal pain: Secondary | ICD-10-CM

## 2019-06-30 DIAGNOSIS — K5792 Diverticulitis of intestine, part unspecified, without perforation or abscess without bleeding: Secondary | ICD-10-CM | POA: Insufficient documentation

## 2019-06-30 LAB — POCT I-STAT CREATININE: Creatinine, Ser: 0.7 mg/dL (ref 0.44–1.00)

## 2019-06-30 MED ORDER — HYDROCODONE-ACETAMINOPHEN 5-325 MG PO TABS: 1.0000 | ORAL_TABLET | Freq: Four times a day (QID) | ORAL | 0 refills | Status: DC | PRN

## 2019-06-30 MED ORDER — SODIUM CHLORIDE (PF) 0.9 % IJ SOLN
INTRAMUSCULAR | Status: AC
  Filled 2019-06-30: qty 50

## 2019-06-30 MED ORDER — IOHEXOL 300 MG/ML  SOLN
100.0000 mL | Freq: Once | INTRAMUSCULAR | Status: AC | PRN
  Administered 2019-06-30: 100 mL via INTRAVENOUS

## 2019-06-30 MED ORDER — CIPROFLOXACIN HCL 500 MG PO TABS: 500.0000 mg | ORAL_TABLET | Freq: Two times a day (BID) | ORAL | 0 refills | Status: AC

## 2019-06-30 MED ORDER — METRONIDAZOLE 500 MG PO TABS: 500.0000 mg | ORAL_TABLET | Freq: Three times a day (TID) | ORAL | 0 refills | Status: AC

## 2019-06-30 NOTE — Telephone Encounter (Signed)
pt has called  regarding this msg, stating she is pain and needs some advice.

## 2019-06-30 NOTE — Telephone Encounter (Signed)
Pt states that she has been having pain due to her diverticulitis. She has been taking the medication but it is not helping.

## 2019-06-30 NOTE — Telephone Encounter (Signed)
If she did not respond to the antibiotics then not sure what we are treating and I cannot call in pain meds without a better understanding  If we need to reschedule her husband's procedure while we sort out her problems that will be what we have to do  If in severe pain then can go to urgent care or ED - and if pain meds warranted then can get them  Sorry cannot do more than this with amount of notice  If we can get a CT scan abd/pelvis with contrast done today can do that for LLQ pain and diverticulitis diagnosis

## 2019-06-30 NOTE — Telephone Encounter (Signed)
Called and spoke with patient-patient informed to be at [facility] at 1:00 pm for stat CT scan with lab work prior to-orders have been placed in [place]; patient is agreeable to plan of care and will go straight to [facility] CT department; report is to be called to RN and RN is to notify MD to review report when notification is called;

## 2019-06-30 NOTE — Telephone Encounter (Signed)
Called and spoke with patient-patient reports she is still having the "diverticulitis flare"-no relief after taking her last antibiotic pill this am -she has been taking Bentyl as often as she can-with no relief Patient reports she is in a lot of pain and is needing something for this as she is the transportation for her husband tomorrow for his procedure with Dr. Carlean Purl;   please advise

## 2019-07-07 NOTE — Telephone Encounter (Signed)
Noted  

## 2019-07-07 NOTE — Telephone Encounter (Signed)
I called her also and explained that she is failing outpatient treatment so going to the emergency department for evaluation, treatment and possible hospital admission is next step.  She was tearful and declined to go to ED.

## 2019-07-07 NOTE — Telephone Encounter (Signed)
FYI- Dr. Niel Hummer to the patient who reports continued uncontrolled pain. The patient was highly upset about being told to report to the ED for pain control and abruptly ended the call by hanging up the phone while I was speaking with her.

## 2019-07-07 NOTE — Telephone Encounter (Signed)
Pt called to update that she is continuing antibiotics as prescribed but is getting no relief from diverticulitis pain.

## 2019-07-10 ENCOUNTER — Other Ambulatory Visit: Payer: Self-pay

## 2019-07-11 ENCOUNTER — Other Ambulatory Visit: Payer: Self-pay

## 2019-07-11 MED ORDER — CIPROFLOXACIN HCL 500 MG PO TABS: 500.0000 mg | ORAL_TABLET | Freq: Two times a day (BID) | ORAL | 0 refills | Status: AC

## 2019-07-11 MED ORDER — CEPHALEXIN 500 MG PO CAPS: 500.0000 mg | ORAL_CAPSULE | Freq: Three times a day (TID) | ORAL | 0 refills | Status: DC

## 2019-07-11 NOTE — Telephone Encounter (Signed)
Cipro and flagyl refilled per Dr Celesta Aver instructions. See [facility] message.

## 2019-07-21 ENCOUNTER — Ambulatory Visit: Payer: Medicare Other | Admitting: Internal Medicine

## 2019-07-21 ENCOUNTER — Encounter: Payer: Self-pay | Admitting: Internal Medicine

## 2019-07-21 VITALS — BP 100/60 | HR 64 | Temp 97.2°F | Ht 64.5 in | Wt 143.0 lb

## 2019-07-21 DIAGNOSIS — K5732 Diverticulitis of large intestine without perforation or abscess without bleeding: Secondary | ICD-10-CM

## 2019-07-21 MED ORDER — CEPHALEXIN 500 MG PO CAPS: 500.0000 mg | ORAL_CAPSULE | Freq: Three times a day (TID) | ORAL | 0 refills | Status: AC

## 2019-07-21 NOTE — Patient Instructions (Signed)
We have sent the following medications to your pharmacy for you to pick up at your convenience: Cephalexin    Call and let us know how your doing after taking the medicine.    I appreciate the opportunity to care for you. Silvano Rusk, MD, [facility]

## 2019-07-21 NOTE — Progress Notes (Signed)
Lauren [name] 74 y.o. [birth year] [id number]  Assessment & Plan:   Encounter Diagnosis  Name Primary?  . Sigmoid diverticulitis Yes    Significantly improved. With mild tenderness try 10 more days of cephalexin. Should she fail to resolve completely need to consider repeat imaging and also surgical referral for elective resection though she and I both would make that a last resort.  She has not had complicated but she has had recurrent diverticulitis.     Subjective:   Chief Complaint: Diverticulitis  HPI Lauren [name] is here for follow-up of sigmoid diverticulitis, with problems of recurrent signs and symptoms over the past few months.  She had problems in October and was diagnosed in the ER, by CT scan on October 19, then returned and had resolution of the diverticulitis despite persistent pain then, about a week later.  She had a colonoscopy by me after that showing her diverticulosis but no diverticulitis.  She called back in February or late January with similar symptoms and was prescribed Augmentin, she had persistent problems and was in quite a bit of pain and a CT scan on February 8 confirmed focal sigmoid diverticulitis again.  Cipro and metronidazole and Cipro and cephalexin were tried.  She is much much better at this point.  A couple of times I recommended she go to the hospital she was complaining of so much pain but she declined to do so.  She did eat some broccoli and Brussels sprouts recently and was unwell for about a day.  However overall significantly better and not taking any narcotic analgesics.  Bowel movements are without difficulty.   Allergies  Allergen Reactions  . Celebrex [Celecoxib] Itching and Rash   Current Meds  Medication Sig  . aspirin EC 325 MG tablet Take 325 mg by mouth daily.  . cephALEXin (KEFLEX) 500 MG capsule Take 1 capsule (500 mg total) by mouth 3 (three) times daily for 10 days.  . ciprofloxacin (CIPRO) 500 MG tablet Take 1 tablet  (500 mg total) by mouth 2 (two) times daily for 10 days.  . RESTASIS 0.05 % ophthalmic emulsion 1 drop 2 (two) times daily.  Marland Kitchen zolpidem (AMBIEN) 5 MG tablet TAKE 1 TABLET BY MOUTH AT  BEDTIME AS NEEDED FOR SLEEP  . [DISCONTINUED] cephALEXin (KEFLEX) 500 MG capsule Take 1 capsule (500 mg total) by mouth 3 (three) times daily for 10 days.   Past Medical History:  Diagnosis Date  . Anxiety   . Arthritis   . Carotid stenosis 10/08/2013  . Cervical disc disease   . Depression   . Diverticulitis   . Diverticulosis   . Frequent headaches   . Hypertension    does not now per pt.  . Hypothyroidism 10/26/2015  . Impaired glucose tolerance 10/08/2013   normal HgA1C  . Neck pain   . Psoriasis    Past Surgical History:  Procedure Laterality Date  . BREAST BIOPSY Left 2011   benign   . CERVICAL FUSION     approx 1980  . CHOLECYSTECTOMY    . COLONOSCOPY    . POSTERIOR CERVICAL FUSION/FORAMINOTOMY N/A 11/05/2014   Procedure: Cervical four to Cervical seven  Posterior Cervical Fusion with lateral mass fixation;  Surgeon: Eustace Moore, MD;  Location: [place] [facility];  Service: Neurosurgery;  Laterality: N/A;   C4 - C7 Posterior Cervical Fusion with lateral mass fixation  . TOTAL ABDOMINAL HYSTERECTOMY  1998   Fibroid  . UPPER GASTROINTESTINAL ENDOSCOPY     Social  History   Social History Narrative   Lives at home with husband and son.   Right-handed.   2 cups caffeine daily.   family history includes Alcohol abuse in an other family member; Alcoholism in her father; Arthritis in an other family member; Cancer in an other family member; Cervical cancer in her mother; Colon cancer in her father; Diabetes in her brother and mother; Heart attack in her mother; Heart disease in her mother and another family member; Hypertension in an other family member; Lung cancer in her father; Throat cancer in her brother.   Review of Systems As per HPI  Objective:   Physical Exam BP 100/60   Pulse 64    Temp (!) 97.2 F (36.2 C)   Ht 5' 4.5" (1.638 m)   Wt 143 lb (64.9 kg)   LMP 05/22/1996   BMI 24.17 kg/m  Minimal left lower quadrant tenderness to deep palpation

## 2019-07-22 ENCOUNTER — Ambulatory Visit: Payer: Medicare Other | Attending: Internal Medicine

## 2019-07-22 ENCOUNTER — Ambulatory Visit: Payer: Medicare Other

## 2019-07-22 DIAGNOSIS — Z23 Encounter for immunization: Secondary | ICD-10-CM

## 2019-07-22 NOTE — Progress Notes (Signed)
   Covid-19 Vaccination Clinic  Name:  Lauren [name]    MRN: [id number] DOB: 05-Mar-1946  07/22/2019  [name] was observed post Covid-19 immunization for 15 minutes without incident. She was provided with Vaccine Information Sheet and instruction to access the V-Safe system.   [name] was instructed to call 911 with any severe reactions post vaccine: Marland Kitchen Difficulty breathing  . Swelling of face and throat  . A fast heartbeat  . A bad rash all over body  . Dizziness and weakness   Immunizations Administered    Name Date Dose VIS Date Route   Pfizer COVID-19 Vaccine 07/22/2019  8:16 AM 0.3 mL 05/02/2019 Intramuscular   Manufacturer: [place]   Lot: [id number]   [place]: [id number]

## 2019-08-12 ENCOUNTER — Encounter: Payer: Self-pay | Admitting: Certified Nurse Midwife

## 2019-09-22 ENCOUNTER — Other Ambulatory Visit: Payer: Self-pay | Admitting: Internal Medicine

## 2019-09-23 NOTE — Telephone Encounter (Signed)
Done erx 

## 2019-10-10 ENCOUNTER — Other Ambulatory Visit: Payer: Self-pay | Admitting: Internal Medicine

## 2019-10-10 NOTE — Telephone Encounter (Signed)
Please refill as per office routine med refill policy (all routine meds refilled for 3 mo or monthly per pt preference up to one year from last visit, then month to month grace period for 3 mo, then further med refills will have to be denied)  

## 2019-12-23 ENCOUNTER — Ambulatory Visit: Payer: Medicare Other | Admitting: Orthopaedic Surgery

## 2020-01-06 ENCOUNTER — Ambulatory Visit: Payer: Medicare Other | Admitting: Orthopaedic Surgery

## 2020-01-14 ENCOUNTER — Ambulatory Visit: Payer: Medicare Other | Admitting: Orthopaedic Surgery

## 2020-01-14 ENCOUNTER — Ambulatory Visit: Payer: Self-pay

## 2020-01-14 ENCOUNTER — Encounter: Payer: Self-pay | Admitting: Orthopaedic Surgery

## 2020-01-14 VITALS — BP 107/66 | HR 71 | Ht 64.0 in | Wt 143.0 lb

## 2020-01-14 DIAGNOSIS — M25562 Pain in left knee: Secondary | ICD-10-CM

## 2020-01-14 DIAGNOSIS — M659 Synovitis and tenosynovitis, unspecified: Secondary | ICD-10-CM | POA: Insufficient documentation

## 2020-01-14 MED ORDER — BUPIVACAINE HCL 0.5 % IJ SOLN
3.0000 mL | INTRAMUSCULAR | Status: AC | PRN
  Administered 2020-01-14: 3 mL via INTRA_ARTICULAR

## 2020-01-14 MED ORDER — LIDOCAINE HCL 1 % IJ SOLN
0.5000 mL | INTRAMUSCULAR | Status: AC | PRN
  Administered 2020-01-14: .5 mL

## 2020-01-14 MED ORDER — METHYLPREDNISOLONE ACETATE 40 MG/ML IJ SUSP
40.0000 mg | INTRAMUSCULAR | Status: AC | PRN
  Administered 2020-01-14: 40 mg via INTRA_ARTICULAR

## 2020-01-14 NOTE — Progress Notes (Signed)
Office Visit Note   Patient: Lauren [name]           Date of Birth: Sep 21, 1945           MRN: [id number] Visit Date: 01/14/2020              Requested by: Biagio Borg, MD [address] [place],  [place] [address] PCP: Biagio Borg, MD   Assessment & Plan: Visit Diagnoses:  1. Acute pain of left knee   2. Synovitis of left knee     Plan: she got immediate knee pain relief post injection. She will use some ice . We discussed the mild degen. Changes in her knees. ROV PRN  Follow-Up Instructions: Return if symptoms worsen or fail to improve.   Orders:  Orders Placed This Encounter  Procedures  . XR KNEE 3 VIEW LEFT   No orders of the defined types were placed in this encounter.     Procedures: Large Joint Inj: L knee on 01/14/2020 12:55 PM Indications: joint swelling and pain Details: 22 G 1.5 in needle, anterolateral approach  Arthrogram: No  Medications: 0.5 mL lidocaine 1 %; 3 mL bupivacaine 0.5 %; 40 mg methylPREDNISolone acetate 40 MG/ML Outcome: tolerated well, no immediate complications Procedure, treatment alternatives, risks and benefits explained, specific risks discussed. Consent was given by the patient. Immediately prior to procedure a time out was called to verify the correct patient, procedure, equipment, support staff and site/side marked as required. Patient was prepped and draped in the usual sterile fashion.       Clinical Data: No additional findings.   Subjective: Chief Complaint  Patient presents with  . Right Knee - Pain  . Left Knee - Pain    HPI 74 year old female had do have some carpet taken out for hardwood floor placed on her house suspended in half with her husband pulling carpet up.  She had increased problems with her knees problem worse on her left knee than the right knee with posterior medial joint line pain some prominence in the popliteal region that radiated in the calf and also problems with stairs getting up  from a squatting position.  She noticed crepitus in her left more than right knee continued pain and walking.  She had had some appointment scheduled to see me but canceled on some days when she thought she might be doing some better.  Review of Systems patient's had hypertension history of hypoglycemia.  Cervical disc degeneration neg for radiculopathy symptoms currently.    Objective: Vital Signs: BP 107/66   Pulse 71   Ht 5\' 4"  (1.626 m)   Wt 143 lb (64.9 kg)   LMP 05/22/1996   BMI 24.55 kg/m   Physical Exam Constitutional:      Appearance: She is well-developed.  HENT:     Head: Normocephalic.     Right Ear: External ear normal.     Left Ear: External ear normal.  Eyes:     Pupils: Pupils are equal, round, and reactive to light.  Neck:     Thyroid: No thyromegaly.     Trachea: No tracheal deviation.  Cardiovascular:     Rate and Rhythm: Normal rate.  Pulmonary:     Effort: Pulmonary effort is normal.  Abdominal:     Palpations: Abdomen is soft.  Skin:    General: Skin is warm and dry.  Neurological:     Mental Status: She is alert and oriented to person, place, and time.  Psychiatric:        Behavior: Behavior normal.     Ortho Exam  Neg logroll hips, medial and posteromedial left knee joint line tenderness. Possible small bakers cyst. Neg SLR . Crepitus with knee extension left more than right knee. Distal pulses 2+.   Specialty Comments:  No specialty comments available.  Imaging: XR KNEE 3 VIEW LEFT  Result Date: 01/14/2020 Standing AP both knees lateral left knee and bilateral sunrise x-rays obtained and reviewed.  This shows mild medial joint line narrowing left and right.  Mild patellofemoral spurring. Impression: Knee x-rays negative for acute changes.  Mild patellofemoral narrowing.    PMFS History: Patient Active Problem List   Diagnosis Date Noted  . Synovitis of left knee 01/14/2020  . Hyperglycemia 12/23/2018  . Hematuria 12/23/2018  .  Vitamin D deficiency 10/25/2017  . Rash 05/24/2017  . Obesity 05/24/2017  . Hypothyroidism 10/26/2015  . Peripheral edema 10/22/2015  . Cervical dystonia 07/14/2015  . Weakness generalized 11/16/2014  . Insomnia 11/16/2014  . S/P cervical spinal fusion 11/05/2014  . Preop exam for internal medicine 10/14/2014  . Essential hypertension 10/14/2014  . Fracture, cervical vertebra ([place]) 08/30/2014  . Encounter for well adult exam with abnormal findings 10/08/2013  . Carotid stenosis 10/08/2013  . Depression   . Diverticulitis   . Anxiety   . Psoriasis   . Cervical disc disease   . Diverticulitis of colon (without mention of hemorrhage)(562.11) 05/10/2013   Past Medical History:  Diagnosis Date  . Anxiety   . Arthritis   . Carotid stenosis 10/08/2013  . Cervical disc disease   . Depression   . Diverticulitis   . Diverticulosis   . Frequent headaches   . Hypertension    does not now per pt.  . Hypothyroidism 10/26/2015  . Impaired glucose tolerance 10/08/2013   normal HgA1C  . Neck pain   . Psoriasis     Family History  Problem Relation Age of Onset  . Heart disease Mother   . Diabetes Mother   . Cervical cancer Mother   . Heart attack Mother   . Lung cancer Father   . Alcoholism Father   . Colon cancer Father   . Diabetes Brother   . Throat cancer Brother   . Cancer Other        lung cancer  . Heart disease Other   . Hypertension Other   . Arthritis Other   . Alcohol abuse Other   . Rectal cancer Neg Hx   . Stomach cancer Neg Hx     Past Surgical History:  Procedure Laterality Date  . BREAST BIOPSY Left 2011   benign   . CERVICAL FUSION     approx 1980  . CHOLECYSTECTOMY    . COLONOSCOPY    . POSTERIOR CERVICAL FUSION/FORAMINOTOMY N/A 11/05/2014   Procedure: Cervical four to Cervical seven  Posterior Cervical Fusion with lateral mass fixation;  Surgeon: Eustace Moore, MD;  Location: [facility];  Service: Neurosurgery;  Laterality: N/A;   C4 - C7 Posterior  Cervical Fusion with lateral mass fixation  . TOTAL ABDOMINAL HYSTERECTOMY  1998   Fibroid  . UPPER GASTROINTESTINAL ENDOSCOPY     Social History   Occupational History  . Occupation: retired  Tobacco Use  . Smoking status: Never Smoker  . Smokeless tobacco: Never Used  Vaping Use  . Vaping Use: Never used  Substance and Sexual Activity  . Alcohol use: Yes    Alcohol/week: 0.0  standard drinks    Comment: 1 a month  . Drug use: No  . Sexual activity: Yes    Partners: Male    Birth control/protection: Surgical    Comment: Hyst

## 2020-02-26 DIAGNOSIS — Z23 Encounter for immunization: Secondary | ICD-10-CM | POA: Diagnosis not present

## 2020-03-06 ENCOUNTER — Other Ambulatory Visit: Payer: Self-pay | Admitting: Internal Medicine

## 2020-03-06 MED ORDER — ESCITALOPRAM OXALATE 10 MG PO TABS: 10.0000 mg | ORAL_TABLET | Freq: Every day | ORAL | 0 refills | Status: DC

## 2020-03-06 NOTE — Telephone Encounter (Signed)
lexapro 10 mg done 1 mo to local [facility]  Please ask pt to make rov

## 2020-03-22 ENCOUNTER — Other Ambulatory Visit: Payer: Self-pay | Admitting: Internal Medicine

## 2020-03-23 NOTE — Telephone Encounter (Signed)
[place] for 1 month as per office refill pokicy - please to ask pt for ROV

## 2020-03-30 ENCOUNTER — Telehealth: Payer: Self-pay | Admitting: Internal Medicine

## 2020-03-30 NOTE — Telephone Encounter (Signed)
   Scheduler left message for patient to call the office. A good Samaritan  by the name of Montine Circle called the office to report he found patients handbag in a [facility] parking lot, [place] Please provide patient the phone number for Montine Circle to retrieve her belongings.  Montine Circle [phone]

## 2020-04-06 ENCOUNTER — Other Ambulatory Visit: Payer: Self-pay | Admitting: Internal Medicine

## 2020-04-07 NOTE — Telephone Encounter (Signed)
lexapro was done 1 mo only  Please for yearly ROV - ok to contact pt

## 2020-04-08 DIAGNOSIS — H25013 Cortical age-related cataract, bilateral: Secondary | ICD-10-CM | POA: Diagnosis not present

## 2020-04-08 DIAGNOSIS — H35342 Macular cyst, hole, or pseudohole, left eye: Secondary | ICD-10-CM | POA: Diagnosis not present

## 2020-04-08 DIAGNOSIS — H353131 Nonexudative age-related macular degeneration, bilateral, early dry stage: Secondary | ICD-10-CM | POA: Diagnosis not present

## 2020-04-08 DIAGNOSIS — H35372 Puckering of macula, left eye: Secondary | ICD-10-CM | POA: Diagnosis not present

## 2020-04-08 DIAGNOSIS — H2513 Age-related nuclear cataract, bilateral: Secondary | ICD-10-CM | POA: Diagnosis not present

## 2020-04-09 ENCOUNTER — Ambulatory Visit (INDEPENDENT_AMBULATORY_CARE_PROVIDER_SITE_OTHER): Payer: Medicare Other

## 2020-04-09 DIAGNOSIS — Z0000 Encounter for general adult medical examination without abnormal findings: Secondary | ICD-10-CM

## 2020-04-09 NOTE — Progress Notes (Signed)
I connected with Lauren [name] today by telephone and verified that I am speaking with the correct person using two identifiers. Location patient: home Location provider: work Persons participating in the virtual visit: Syann Cupples, husband and Lisette Abu, LPN.   I discussed the limitations, risks, security and privacy concerns of performing an evaluation and management service by telephone and the availability of in person appointments. I also discussed with the patient that there may be a patient responsible charge related to this service. The patient expressed understanding and verbally consented to this telephonic visit.    Interactive audio and video telecommunications were attempted between this provider and patient, however failed, due to patient having technical difficulties OR patient did not have access to video capability.  We continued and completed visit with audio only.  Some vital signs may be absent or patient reported.   Time Spent with patient on telephone encounter: 30 minutes  Subjective:   Lauren [name] is a 74 y.o. female who presents for Medicare Annual (Subsequent) preventive examination.  Review of Systems    No ROS. Medicare Wellness Visit. Additional risk factors are reflected in social history. Cardiac Risk Factors include: family history of premature cardiovascular disease;hypertension;advanced age (>36men, >3 women)     Objective:    There were no vitals filed for this visit. There is no height or weight on file to calculate BMI.  Advanced Directives 04/09/2020 03/22/2019 03/10/2019 05/15/2017 09/17/2015 06/10/2015 11/05/2014  Does Patient Have a Medical Advance Directive? Yes Yes No No No No No  Type of Advance Directive Living will;Out of facility DNR (pink MOST or yellow form) Out of facility DNR (pink MOST or yellow form) - - - - -  Does patient want to make changes to medical advance directive? No - Patient declined - - - - - -    Would patient like information on creating a medical advance directive? - No - Patient declined No - Patient declined No - Patient declined - No - patient declined information -    Current Medications (verified) Outpatient Encounter Medications as of 04/09/2020  Medication Sig  . aspirin EC 325 MG tablet Take 325 mg by mouth daily.  Marland Kitchen escitalopram (LEXAPRO) 10 MG tablet TAKE 1 TABLET BY MOUTH  DAILY  . escitalopram (LEXAPRO) 10 MG tablet Take 1 tablet (10 mg total) by mouth daily.  Marland Kitchen escitalopram (LEXAPRO) 10 MG tablet TAKE 1 TABLET BY MOUTH  DAILY  . escitalopram (LEXAPRO) 10 MG tablet TAKE 1 TABLET BY MOUTH  DAILY  . hydrochlorothiazide (MICROZIDE) 12.5 MG capsule TAKE 1 CAPSULE BY MOUTH  DAILY  . levothyroxine (SYNTHROID) 50 MCG tablet TAKE 1 TABLET BY MOUTH  DAILY  . RESTASIS 0.05 % ophthalmic emulsion 1 drop 2 (two) times daily.  Marland Kitchen zolpidem (AMBIEN) 5 MG tablet TAKE 1 TABLET BY MOUTH AT  BEDTIME AS NEEDED FOR SLEEP  . amLODipine (NORVASC) 5 MG tablet TAKE 1 TABLET BY MOUTH  DAILY (Patient not taking: Reported on 04/09/2020)   No facility-administered encounter medications on file as of 04/09/2020.    Allergies (verified) Celebrex [celecoxib]   History: Past Medical History:  Diagnosis Date  . Anxiety   . Arthritis   . Carotid stenosis 10/08/2013  . Cervical disc disease   . Depression   . Diverticulitis   . Diverticulosis   . Frequent headaches   . Hypertension    does not now per pt.  . Hypothyroidism 10/26/2015  . Impaired glucose tolerance 10/08/2013  normal HgA1C  . Neck pain   . Psoriasis    Past Surgical History:  Procedure Laterality Date  . BREAST BIOPSY Left 2011   benign   . CERVICAL FUSION     approx 1980  . CHOLECYSTECTOMY    . COLONOSCOPY    . POSTERIOR CERVICAL FUSION/FORAMINOTOMY N/A 11/05/2014   Procedure: Cervical four to Cervical seven  Posterior Cervical Fusion with lateral mass fixation;  Surgeon: Eustace Moore, MD;  Location: [facility];   Service: Neurosurgery;  Laterality: N/A;   C4 - C7 Posterior Cervical Fusion with lateral mass fixation  . TOTAL ABDOMINAL HYSTERECTOMY  1998   Fibroid  . UPPER GASTROINTESTINAL ENDOSCOPY     Family History  Problem Relation Age of Onset  . Heart disease Mother   . Diabetes Mother   . Cervical cancer Mother   . Heart attack Mother   . Lung cancer Father   . Alcoholism Father   . Colon cancer Father   . Diabetes Brother   . Throat cancer Brother   . Cancer Other        lung cancer  . Heart disease Other   . Hypertension Other   . Arthritis Other   . Alcohol abuse Other   . Rectal cancer Neg Hx   . Stomach cancer Neg Hx    Social History   Socioeconomic History  . Marital status: Married    Spouse name: Not on file  . Number of children: 2  . Years of education: 13  . Highest education level: Not on file  Occupational History  . Occupation: retired  Tobacco Use  . Smoking status: Never Smoker  . Smokeless tobacco: Never Used  Vaping Use  . Vaping Use: Never used  Substance and Sexual Activity  . Alcohol use: Yes    Alcohol/week: 0.0 standard drinks    Comment: 1 a month  . Drug use: No  . Sexual activity: Yes    Partners: Male    Birth control/protection: Surgical    Comment: Hyst  Other Topics Concern  . Not on file  Social History Narrative   Lives at home with husband and son.   Right-handed.   2 cups caffeine daily.   Social Determinants of Health   Financial Resource Strain: Low Risk   . Difficulty of Paying Living Expenses: Not hard at all  Food Insecurity: No Food Insecurity  . Worried About Charity fundraiser in the Last Year: Never true  . Ran Out of Food in the Last Year: Never true  Transportation Needs: No Transportation Needs  . Lack of Transportation (Medical): No  . Lack of Transportation (Non-Medical): No  Physical Activity: Inactive  . Days of Exercise per Week: 0 days  . Minutes of Exercise per Session: 0 min  Stress: No Stress  Concern Present  . Feeling of Stress : Not at all  Social Connections: Moderately Isolated  . Frequency of Communication with Friends and Family: More than three times a week  . Frequency of Social Gatherings with Friends and Family: Never  . Attends Religious Services: Never  . Active Member of Clubs or Organizations: No  . Attends Archivist Meetings: Never  . Marital Status: Married    Tobacco Counseling Counseling given: Not Answered   Clinical Intake:  Pre-visit preparation completed: Yes  Pain : No/denies pain     Nutritional Risks: None Diabetes: No  How often do you need to have someone help you  when you read instructions, pamphlets, or other written materials from your doctor or pharmacy?: 1 - Never What is the last grade level you completed in school?: HSG  Diabetic? no  Interpreter Needed?: No  Information entered by :: Lisette Abu, LPN   Activities of Daily Living In your present state of health, do you have any difficulty performing the following activities: 04/09/2020  Hearing? N  Vision? N  Difficulty concentrating or making decisions? N  Walking or climbing stairs? N  Dressing or bathing? N  Doing errands, shopping? N  Preparing Food and eating ? N  Using the Toilet? N  In the past six months, have you accidently leaked urine? N  Do you have problems with loss of bowel control? N  Managing your Medications? N  Managing your Finances? N  Housekeeping or managing your Housekeeping? N  Some recent data might be hidden    Patient Care Team: Biagio Borg, MD as PCP - General (Internal Medicine) Clydell Hakim, MD as Consulting Physician (Anesthesiology)  Indicate any recent Medical Services you may have received from other than [facility] providers in the past year (date may be approximate).     Assessment:   This is a routine wellness examination for Lauren [name].  Hearing/Vision screen No exam data present  Dietary issues and exercise  activities discussed: Current Exercise Habits: The patient does not participate in regular exercise at present, Exercise limited by: orthopedic condition(s)  Goals    .  Patient Stated (pt-stated)      My goal is to continue to be independent and start back being active by walking and exercising starting at the beginning of the year 2022.      Depression Screen PHQ 2/9 Scores 04/09/2020 12/23/2018 10/25/2017 10/24/2016 10/22/2015 10/14/2014 10/08/2013  PHQ - 2 Score 0 1 1 0 0 0 0    Fall Risk Fall Risk  04/09/2020 12/23/2018 10/25/2017 10/24/2016 10/22/2015  Falls in the past year? 0 0 No No Yes  Number falls in past yr: 0 - - - 1  Injury with Fall? 0 - - - Yes  Comment - - - - required c-spine surgury fall 2016  Risk for fall due to : No Fall Risks - - - -  Follow up Falls evaluation completed - - - -    Any stairs in or around the home? Yes  If so, are there any without handrails? No  Home free of loose throw rugs in walkways, pet beds, electrical cords, etc? Yes  Adequate lighting in your home to reduce risk of falls? Yes   ASSISTIVE DEVICES UTILIZED TO PREVENT FALLS:  Life alert? No  Use of a cane, walker or w/c? No  Grab bars in the bathroom? Yes  Shower chair or bench in shower? No  Elevated toilet seat or a handicapped toilet? Yes   TIMED UP AND GO:  Was the test performed? No .  Length of time to ambulate 10 feet: 0 sec.   Gait steady and fast without use of assistive device  Cognitive Function: Patient is cogitatively intact.        Immunizations Immunization History  Administered Date(s) Administered  . Fluad Quad(high Dose 65+) 02/07/2019  . Influenza, High Dose Seasonal PF 02/26/2020  . Influenza-Unspecified 02/20/2015, 02/26/2016, 03/22/2018  . PFIZER SARS-COV-2 Vaccination 06/28/2019, 07/22/2019  . Pneumococcal Conjugate-13 10/08/2013  . Pneumococcal Polysaccharide-23 10/14/2014  . Tdap 05/22/2016  . Zoster 10/15/2014  . Zoster Recombinat (Shingrix) 07/04/2017,  10/31/2017    TDAP  status: Up to date Flu Vaccine status: Up to date Pneumococcal vaccine status: Up to date Covid-19 vaccine status: Completed vaccines  Qualifies for Shingles Vaccine? Yes   Zostavax completed Yes   Shingrix Completed?: Yes  Screening Tests Health Maintenance  Topic Date Due  . MAMMOGRAM  06/16/2020  . TETANUS/TDAP  05/22/2026  . COLONOSCOPY  05/11/2029  . INFLUENZA VACCINE  Completed  . DEXA SCAN  Completed  . COVID-19 Vaccine  Completed  . Hepatitis C Screening  Completed  . PNA vac Low Risk Adult  Completed    Health Maintenance  There are no preventive care reminders to display for this patient.  Colorectal cancer screening: Completed 05/12/2019. Repeat every 10 years Mammogram status: Completed 06/17/2019. Repeat every year Bone Density status: Completed 04/23/2017. Results reflect: Bone density results: OSTEOPENIA. Repeat every 2 years.  Lung Cancer Screening: (Low Dose CT Chest recommended if Age 24-80 years, 30 pack-year currently smoking OR have quit w/in 15years.) does not qualify.   Lung Cancer Screening Referral: no  Additional Screening:  Hepatitis C Screening: does qualify; Completed yes  Vision Screening: Recommended annual ophthalmology exams for early detection of glaucoma and other disorders of the eye. Is the patient up to date with their annual eye exam?  Yes  Who is the provider or what is the name of the office in which the patient attends annual eye exams? Tempie Hoist, MD. If pt is not established with a provider, would they like to be referred to a provider to establish care? No .   Dental Screening: Recommended annual dental exams for proper oral hygiene  Community Resource Referral / Chronic Care Management: CRR required this visit?  No   CCM required this visit?  No      Plan:     I have personally reviewed and noted the following in the patient's chart:   . Medical and social history . Use of alcohol, tobacco  or illicit drugs  . Current medications and supplements . Functional ability and status . Nutritional status . Physical activity . Advanced directives . List of other physicians . Hospitalizations, surgeries, and ER visits in previous 12 months . Vitals . Screenings to include cognitive, depression, and falls . Referrals and appointments  In addition, I have reviewed and discussed with patient certain preventive protocols, quality metrics, and best practice recommendations. A written personalized care plan for preventive services as well as general preventive health recommendations were provided to patient.     Sheral Flow, LPN   [id number]   Nurse Notes:  Patient is cogitatively intact. There were no vitals filed for this visit. There is no height or weight on file to calculate BMI. Patient stated that she has no issues with gait or balance; does not use any assistive devices.

## 2020-04-09 NOTE — Patient Instructions (Addendum)
Lauren [name] , Thank you for taking time to come for your Medicare Wellness Visit. I appreciate your ongoing commitment to your health goals. Please review the following plan we discussed and let me know if I can assist you in the future.   Screening recommendations/referrals: Colonoscopy: 05/12/2019 Mammogram: 06/17/2019 Bone Density: 04/23/2017 Recommended yearly ophthalmology/optometry visit for glaucoma screening and checkup Recommended yearly dental visit for hygiene and checkup  Vaccinations: Influenza vaccine: 02/26/2020 Pneumococcal vaccine: 10/08/2013, 10/14/2014 Tdap vaccine: 05/22/2016 Shingles vaccine: 10/31/2017, 07/04/2017 Covid-19: 07/22/2019, 06/28/2019  Advanced directives: Documents on file.  Conditions/risks identified:  Yes; Reviewed health maintenance screenings with patient today and relevant education, vaccines, and/or referrals were provided. Please continue to do your personal lifestyle choices by: daily care of teeth and gums, regular physical activity (goal should be 5 days a week for 30 minutes), eat a healthy diet, avoid tobacco and drug use, limiting any alcohol intake, taking a low-dose aspirin (if not allergic or have been advised by your provider otherwise) and taking vitamins and minerals as recommended by your provider. Continue doing brain stimulating activities (puzzles, reading, adult coloring books, staying active) to keep memory sharp. Continue to eat heart healthy diet (full of fruits, vegetables, whole grains, lean protein, water--limit salt, fat, and sugar intake) and increase physical activity as tolerated.  Next appointment: Please schedule your next Medicare Wellness Visit with your Nurse Health Advisor in 1 year by calling [phone].   Preventive Care 75 Years and Older, Female Preventive care refers to lifestyle choices and visits with your health care provider that can promote health and wellness. What does preventive care include?  A yearly physical  exam. This is also called an annual well check.  Dental exams once or twice a year.  Routine eye exams. Ask your health care provider how often you should have your eyes checked.  Personal lifestyle choices, including:  Daily care of your teeth and gums.  Regular physical activity.  Eating a healthy diet.  Avoiding tobacco and drug use.  Limiting alcohol use.  Practicing safe sex.  Taking low-dose aspirin every day.  Taking vitamin and mineral supplements as recommended by your health care provider. What happens during an annual well check? The services and screenings done by your health care provider during your annual well check will depend on your age, overall health, lifestyle risk factors, and family history of disease. Counseling  Your health care provider may ask you questions about your:  Alcohol use.  Tobacco use.  Drug use.  Emotional well-being.  Home and relationship well-being.  Sexual activity.  Eating habits.  History of falls.  Memory and ability to understand (cognition).  Work and work Statistician.  Reproductive health. Screening  You may have the following tests or measurements:  Height, weight, and BMI.  Blood pressure.  Lipid and cholesterol levels. These may be checked every 5 years, or more frequently if you are over 82 years old.  Skin check.  Lung cancer screening. You may have this screening every year starting at age 37 if you have a 30-pack-year history of smoking and currently smoke or have quit within the past 15 years.  Fecal occult blood test (FOBT) of the stool. You may have this test every year starting at age 52.  Flexible sigmoidoscopy or colonoscopy. You may have a sigmoidoscopy every 5 years or a colonoscopy every 10 years starting at age 59.  Hepatitis C blood test.  Hepatitis B blood test.  Sexually transmitted disease (STD) testing.  Diabetes screening. This is done by checking your blood sugar (glucose)  after you have not eaten for a while (fasting). You may have this done every 1-3 years.  Bone density scan. This is done to screen for osteoporosis. You may have this done starting at age 27.  Mammogram. This may be done every 1-2 years. Talk to your health care provider about how often you should have regular mammograms. Talk with your health care provider about your test results, treatment options, and if necessary, the need for more tests. Vaccines  Your health care provider may recommend certain vaccines, such as:  Influenza vaccine. This is recommended every year.  Tetanus, diphtheria, and acellular pertussis (Tdap, Td) vaccine. You may need a Td booster every 10 years.  Zoster vaccine. You may need this after age 3.  Pneumococcal 13-valent conjugate (PCV13) vaccine. One dose is recommended after age 47.  Pneumococcal polysaccharide (PPSV23) vaccine. One dose is recommended after age 40. Talk to your health care provider about which screenings and vaccines you need and how often you need them. This information is not intended to replace advice given to you by your health care provider. Make sure you discuss any questions you have with your health care provider. Document Released: 06/04/2015 Document Revised: 01/26/2016 Document Reviewed: 03/09/2015 Elsevier Interactive Patient Education  2017 [place] Prevention in the Home Falls can cause injuries. They can happen to people of all ages. There are many things you can do to make your home safe and to help prevent falls. What can I do on the outside of my home?  Regularly fix the edges of walkways and driveways and fix any cracks.  Remove anything that might make you trip as you walk through a door, such as a raised step or threshold.  Trim any bushes or trees on the path to your home.  Use bright outdoor lighting.  Clear any walking paths of anything that might make someone trip, such as rocks or  tools.  Regularly check to see if handrails are loose or broken. Make sure that both sides of any steps have handrails.  Any raised decks and porches should have guardrails on the edges.  Have any leaves, snow, or ice cleared regularly.  Use sand or salt on walking paths during winter.  Clean up any spills in your garage right away. This includes oil or grease spills. What can I do in the bathroom?  Use night lights.  Install grab bars by the toilet and in the tub and shower. Do not use towel bars as grab bars.  Use non-skid mats or decals in the tub or shower.  If you need to sit down in the shower, use a plastic, non-slip stool.  Keep the floor dry. Clean up any water that spills on the floor as soon as it happens.  Remove soap buildup in the tub or shower regularly.  Attach bath mats securely with double-sided non-slip rug tape.  Do not have throw rugs and other things on the floor that can make you trip. What can I do in the bedroom?  Use night lights.  Make sure that you have a light by your bed that is easy to reach.  Do not use any sheets or blankets that are too big for your bed. They should not hang down onto the floor.  Have a firm chair that has side arms. You can use this for support while you get dressed.  Do not have throw  rugs and other things on the floor that can make you trip. What can I do in the kitchen?  Clean up any spills right away.  Avoid walking on wet floors.  Keep items that you use a lot in easy-to-reach places.  If you need to reach something above you, use a strong step stool that has a grab bar.  Keep electrical cords out of the way.  Do not use floor polish or wax that makes floors slippery. If you must use wax, use non-skid floor wax.  Do not have throw rugs and other things on the floor that can make you trip. What can I do with my stairs?  Do not leave any items on the stairs.  Make sure that there are handrails on both  sides of the stairs and use them. Fix handrails that are broken or loose. Make sure that handrails are as long as the stairways.  Check any carpeting to make sure that it is firmly attached to the stairs. Fix any carpet that is loose or worn.  Avoid having throw rugs at the top or bottom of the stairs. If you do have throw rugs, attach them to the floor with carpet tape.  Make sure that you have a light switch at the top of the stairs and the bottom of the stairs. If you do not have them, ask someone to add them for you. What else can I do to help prevent falls?  Wear shoes that:  Do not have high heels.  Have rubber bottoms.  Are comfortable and fit you well.  Are closed at the toe. Do not wear sandals.  If you use a stepladder:  Make sure that it is fully opened. Do not climb a closed stepladder.  Make sure that both sides of the stepladder are locked into place.  Ask someone to hold it for you, if possible.  Clearly mark and make sure that you can see:  Any grab bars or handrails.  First and last steps.  Where the edge of each step is.  Use tools that help you move around (mobility aids) if they are needed. These include:  Canes.  Walkers.  Scooters.  Crutches.  Turn on the lights when you go into a dark area. Replace any light bulbs as soon as they burn out.  Set up your furniture so you have a clear path. Avoid moving your furniture around.  If any of your floors are uneven, fix them.  If there are any pets around you, be aware of where they are.  Review your medicines with your doctor. Some medicines can make you feel dizzy. This can increase your chance of falling. Ask your doctor what other things that you can do to help prevent falls. This information is not intended to replace advice given to you by your health care provider. Make sure you discuss any questions you have with your health care provider. Document Released: 03/04/2009 Document Revised:  10/14/2015 Document Reviewed: 06/12/2014 Elsevier Interactive Patient Education  2017 [employer].

## 2020-04-12 ENCOUNTER — Ambulatory Visit: Payer: Medicare Other | Admitting: Internal Medicine

## 2020-04-13 ENCOUNTER — Other Ambulatory Visit: Payer: Self-pay

## 2020-04-13 ENCOUNTER — Encounter (INDEPENDENT_AMBULATORY_CARE_PROVIDER_SITE_OTHER): Payer: Medicare Other | Admitting: Ophthalmology

## 2020-04-13 DIAGNOSIS — D3131 Benign neoplasm of right choroid: Secondary | ICD-10-CM

## 2020-04-13 DIAGNOSIS — I10 Essential (primary) hypertension: Secondary | ICD-10-CM

## 2020-04-13 DIAGNOSIS — H35033 Hypertensive retinopathy, bilateral: Secondary | ICD-10-CM | POA: Diagnosis not present

## 2020-04-13 DIAGNOSIS — H43813 Vitreous degeneration, bilateral: Secondary | ICD-10-CM

## 2020-04-13 DIAGNOSIS — H35372 Puckering of macula, left eye: Secondary | ICD-10-CM

## 2020-04-20 ENCOUNTER — Ambulatory Visit (INDEPENDENT_AMBULATORY_CARE_PROVIDER_SITE_OTHER): Payer: Medicare Other | Admitting: Internal Medicine

## 2020-04-20 ENCOUNTER — Other Ambulatory Visit: Payer: Self-pay

## 2020-04-20 ENCOUNTER — Encounter: Payer: Self-pay | Admitting: Internal Medicine

## 2020-04-20 VITALS — BP 122/82 | HR 86 | Temp 98.3°F | Ht 64.0 in | Wt 149.0 lb

## 2020-04-20 DIAGNOSIS — E039 Hypothyroidism, unspecified: Secondary | ICD-10-CM

## 2020-04-20 DIAGNOSIS — I10 Essential (primary) hypertension: Secondary | ICD-10-CM

## 2020-04-20 DIAGNOSIS — F32A Depression, unspecified: Secondary | ICD-10-CM

## 2020-04-20 DIAGNOSIS — F5101 Primary insomnia: Secondary | ICD-10-CM

## 2020-04-20 DIAGNOSIS — Z0001 Encounter for general adult medical examination with abnormal findings: Secondary | ICD-10-CM

## 2020-04-20 DIAGNOSIS — E559 Vitamin D deficiency, unspecified: Secondary | ICD-10-CM

## 2020-04-20 DIAGNOSIS — F419 Anxiety disorder, unspecified: Secondary | ICD-10-CM | POA: Diagnosis not present

## 2020-04-20 DIAGNOSIS — E538 Deficiency of other specified B group vitamins: Secondary | ICD-10-CM

## 2020-04-20 DIAGNOSIS — R739 Hyperglycemia, unspecified: Secondary | ICD-10-CM | POA: Diagnosis not present

## 2020-04-20 LAB — CBC WITH DIFFERENTIAL/PLATELET
Basophils Absolute: 0 10*3/uL (ref 0.0–0.1)
Basophils Relative: 0.8 % (ref 0.0–3.0)
Eosinophils Absolute: 0.1 10*3/uL (ref 0.0–0.7)
Eosinophils Relative: 1.4 % (ref 0.0–5.0)
HCT: 39 % (ref 36.0–46.0)
Hemoglobin: 13 g/dL (ref 12.0–15.0)
Lymphocytes Relative: 24.4 % (ref 12.0–46.0)
Lymphs Abs: 1.5 10*3/uL (ref 0.7–4.0)
MCHC: 33.4 g/dL (ref 30.0–36.0)
MCV: 95.8 fl (ref 78.0–100.0)
Monocytes Absolute: 0.6 10*3/uL (ref 0.1–1.0)
Monocytes Relative: 9.6 % (ref 3.0–12.0)
Neutro Abs: 4 10*3/uL (ref 1.4–7.7)
Neutrophils Relative %: 63.8 % (ref 43.0–77.0)
Platelets: 221 10*3/uL (ref 150.0–400.0)
RBC: 4.07 Mil/uL (ref 3.87–5.11)
RDW: 13.6 % (ref 11.5–15.5)
WBC: 6.2 10*3/uL (ref 4.0–10.5)

## 2020-04-20 LAB — BASIC METABOLIC PANEL
BUN: 15 mg/dL (ref 6–23)
CO2: 36 mEq/L — ABNORMAL HIGH (ref 19–32)
Calcium: 9.3 mg/dL (ref 8.4–10.5)
Chloride: 101 mEq/L (ref 96–112)
Creatinine, Ser: 0.77 mg/dL (ref 0.40–1.20)
GFR: 75.6 mL/min (ref >60.00)
Glucose, Bld: 88 mg/dL (ref 70–99)
Potassium: 3.3 mEq/L — ABNORMAL LOW (ref 3.5–5.1)
Sodium: 141 mEq/L (ref 135–145)

## 2020-04-20 LAB — LIPID PANEL
Cholesterol: 170 mg/dL (ref 0–200)
HDL: 33 mg/dL — ABNORMAL LOW (ref >39.00)
NonHDL: 136.65
Total CHOL/HDL Ratio: 5
Triglycerides: 273 mg/dL — ABNORMAL HIGH (ref 0.0–149.0)
VLDL: 54.6 mg/dL — ABNORMAL HIGH (ref 0.0–40.0)

## 2020-04-20 LAB — VITAMIN D 25 HYDROXY (VIT D DEFICIENCY, FRACTURES): VITD: 34.74 ng/mL (ref 30.00–100.00)

## 2020-04-20 LAB — URINALYSIS, ROUTINE W REFLEX MICROSCOPIC
Bilirubin Urine: NEGATIVE
Hgb urine dipstick: NEGATIVE
Ketones, ur: NEGATIVE
Leukocytes,Ua: NEGATIVE
Nitrite: NEGATIVE
Specific Gravity, Urine: 1.025 (ref 1.000–1.030)
Total Protein, Urine: NEGATIVE
Urine Glucose: NEGATIVE
Urobilinogen, UA: 0.2 (ref 0.0–1.0)
pH: 6 (ref 5.0–8.0)

## 2020-04-20 LAB — HEPATIC FUNCTION PANEL
ALT: 17 U/L (ref 0–35)
AST: 21 U/L (ref 0–37)
Albumin: 4.2 g/dL (ref 3.5–5.2)
Alkaline Phosphatase: 71 U/L (ref 39–117)
Bilirubin, Direct: 0.2 mg/dL (ref 0.0–0.3)
Total Bilirubin: 0.6 mg/dL (ref 0.2–1.2)
Total Protein: 7.1 g/dL (ref 6.0–8.3)

## 2020-04-20 LAB — LDL CHOLESTEROL, DIRECT: Direct LDL: 94 mg/dL

## 2020-04-20 LAB — TSH: TSH: 4.29 u[IU]/mL (ref 0.35–4.50)

## 2020-04-20 LAB — VITAMIN B12: Vitamin B-12: 240 pg/mL (ref 211–911)

## 2020-04-20 LAB — HEMOGLOBIN A1C: Hgb A1c MFr Bld: 5.6 % (ref 4.6–6.5)

## 2020-04-20 MED ORDER — ALPRAZOLAM 1 MG PO TABS: ORAL_TABLET | ORAL | 2 refills | Status: DC

## 2020-04-20 MED ORDER — LEVOTHYROXINE SODIUM 50 MCG PO TABS: 50.0000 ug | ORAL_TABLET | Freq: Every day | ORAL | 3 refills | Status: DC

## 2020-04-20 MED ORDER — ZOLPIDEM TARTRATE 5 MG PO TABS: 5.0000 mg | ORAL_TABLET | Freq: Every evening | ORAL | 1 refills | Status: DC | PRN

## 2020-04-20 MED ORDER — HYDROCHLOROTHIAZIDE 12.5 MG PO CAPS: 12.5000 mg | ORAL_CAPSULE | Freq: Every day | ORAL | 3 refills | Status: DC

## 2020-04-20 MED ORDER — ESCITALOPRAM OXALATE 10 MG PO TABS: 10.0000 mg | ORAL_TABLET | Freq: Every day | ORAL | 3 refills | Status: DC

## 2020-04-20 NOTE — Patient Instructions (Signed)
Please take all new medication as prescribed - the xanax (sent to [facility])  Please continue all other medications as before, and refills have been done if requested - to [facility]  Please have the pharmacy call with any other refills you may need.  Please continue your efforts at being more active, low cholesterol diet, and weight control.  You are otherwise up to date with prevention measures today.  Please keep your appointments with your specialists as you may have planned  Please go to the LAB at the blood drawing area for the tests to be done  You will be contacted by phone if any changes need to be made immediately.  Otherwise, you will receive a letter about your results with an explanation, but please check with MyChart first.  Please remember to sign up for MyChart if you have not done so, as this will be important to you in the future with finding out test results, communicating by private email, and scheduling acute appointments online when needed.  Please make an Appointment to return for your 1 year visit, or sooner if needed

## 2020-04-20 NOTE — Progress Notes (Signed)
Subjective:    Patient ID: Lauren [name], female    DOB: 1945-07-23, 74 y.o.   MRN: [id number]  HPI   Here for wellness and f/u;  Overall doing ok;  Pt denies Chest pain, worsening SOB, DOE, wheezing, orthopnea, PND, worsening LE edema, palpitations, dizziness or syncope.  Pt denies neurological change such as new headache, facial or extremity weakness.  Pt denies polydipsia, polyuria, or low sugar symptoms. Pt states overall good compliance with treatment and medications, good tolerability, and has been trying to follow appropriate diet.  Pt has had mild worsening depressive symptoms with anxiety and insomnia, bit no suicidal ideation or panic. No fever, night sweats, wt loss, loss of appetite, or other constitutional symptoms.  Pt states good ability with ADL's, has low fall risk, home safety reviewed and adequate, no other significant changes in hearing or vision, and only occasionally active with exercise.  No other new complaints  Denies hyper or hypo thyroid symptoms such as voice, skin or hair change. Past Medical History:  Diagnosis Date  . Anxiety   . Arthritis   . Carotid stenosis 10/08/2013  . Cervical disc disease   . Depression   . Diverticulitis   . Diverticulosis   . Frequent headaches   . Hypertension    does not now per pt.  . Hypothyroidism 10/26/2015  . Impaired glucose tolerance 10/08/2013   normal HgA1C  . Neck pain   . Psoriasis    Past Surgical History:  Procedure Laterality Date  . BREAST BIOPSY Left 2011   benign   . CERVICAL FUSION     approx 1980  . CHOLECYSTECTOMY    . COLONOSCOPY    . POSTERIOR CERVICAL FUSION/FORAMINOTOMY N/A 11/05/2014   Procedure: Cervical four to Cervical seven  Posterior Cervical Fusion with lateral mass fixation;  Surgeon: Eustace Moore, MD;  Location: [facility];  Service: Neurosurgery;  Laterality: N/A;   C4 - C7 Posterior Cervical Fusion with lateral mass fixation  . TOTAL ABDOMINAL HYSTERECTOMY  1998   Fibroid  . UPPER  GASTROINTESTINAL ENDOSCOPY      reports that she has never smoked. She has never used smokeless tobacco. She reports current alcohol use. She reports that she does not use drugs. family history includes Alcohol abuse in an other family member; Alcoholism in her father; Arthritis in an other family member; Cancer in an other family member; Cervical cancer in her mother; Colon cancer in her father; Diabetes in her brother and mother; Heart attack in her mother; Heart disease in her mother and another family member; Hypertension in an other family member; Lung cancer in her father; Throat cancer in her brother. Allergies  Allergen Reactions  . Celebrex [Celecoxib] Itching and Rash   Current Outpatient Medications on File Prior to Visit  Medication Sig Dispense Refill  . aspirin EC 325 MG tablet Take 325 mg by mouth daily.    . RESTASIS 0.05 % ophthalmic emulsion 1 drop 2 (two) times daily.     No current facility-administered medications on file prior to visit.   Review of Systems All otherwise neg per pt     Objective:   Physical Exam BP 122/82 (BP Location: Left Arm, Patient Position: Sitting, Cuff Size: Large)   Pulse 86   Temp 98.3 F (36.8 C) (Oral)   Ht 5\' 4"  (1.626 m)   Wt 149 lb (67.6 kg)   LMP 05/22/1996   SpO2 96%   BMI 25.58 kg/m  VS noted,  Constitutional: Pt appears in NAD HENT: Head: NCAT.  Right Ear: External ear normal.  Left Ear: External ear normal.  Eyes: . Pupils are equal, round, and reactive to light. Conjunctivae and EOM are normal Nose: without d/c or deformity Neck: Neck supple. Gross normal ROM Cardiovascular: Normal rate and regular rhythm.   Pulmonary/Chest: Effort normal and breath sounds without rales or wheezing.  Abd:  Soft, NT, ND, + BS, no organomegaly Neurological: Pt is alert. At baseline orientation, motor grossly intact Skin: Skin is warm. No rashes, other new lesions, no LE edema Psychiatric: Pt behavior is normal without agitation ,  depressed affect All otherwise neg per pt Lab Results  Component Value Date   WBC 6.2 04/20/2020   HGB 13.0 04/20/2020   HCT 39.0 04/20/2020   PLT 221.0 04/20/2020   GLUCOSE 88 04/20/2020   CHOL 170 04/20/2020   TRIG 273.0 (H) 04/20/2020   HDL 33.00 (L) 04/20/2020   LDLDIRECT 94.0 04/20/2020   LDLCALC 90 10/26/2017   ALT 17 04/20/2020   AST 21 04/20/2020   NA 141 04/20/2020   K 3.3 (L) 04/20/2020   CL 101 04/20/2020   CREATININE 0.77 04/20/2020   BUN 15 04/20/2020   CO2 36 (H) 04/20/2020   TSH 4.29 04/20/2020   INR 0.97 10/27/2014   HGBA1C 5.6 04/20/2020      Assessment & Plan:

## 2020-04-25 ENCOUNTER — Encounter: Payer: Self-pay | Admitting: Internal Medicine

## 2020-04-25 NOTE — Assessment & Plan Note (Signed)
With mild worsening, for ambien 5 qhs prn

## 2020-04-25 NOTE — Assessment & Plan Note (Signed)
stable overall by history and exam, recent data reviewed with pt, and pt to continue medical treatment as before,  to f/u any worsening symptoms or concerns  

## 2020-04-25 NOTE — Assessment & Plan Note (Signed)
Moderate to severe, for xanax bid prn,  to f/u any worsening symptoms or concerns

## 2020-04-25 NOTE — Assessment & Plan Note (Addendum)
Also for lexapro 10 qd, declines referral counseling  I spent 41 minutes in addition to time for CPX wellness examination in preparing to see the patient by review of recent labs, imaging and procedures, obtaining and reviewing separately obtained history, communicating with the patient and family or caregiver, ordering medications, tests or procedures, and documenting clinical information in the EHR including the differential Dx, treatment, and any further evaluation and other management of depression, anxiety insomnia, hypothryoidism, hyperglycemia, htn, vit d def

## 2020-04-25 NOTE — Assessment & Plan Note (Signed)

## 2020-04-25 NOTE — Assessment & Plan Note (Signed)
Cont oral replacement 

## 2020-05-05 ENCOUNTER — Encounter: Payer: Self-pay | Admitting: Surgery

## 2020-05-05 ENCOUNTER — Ambulatory Visit (INDEPENDENT_AMBULATORY_CARE_PROVIDER_SITE_OTHER): Payer: Medicare Other | Admitting: Surgery

## 2020-05-05 ENCOUNTER — Ambulatory Visit: Payer: Self-pay

## 2020-05-05 ENCOUNTER — Other Ambulatory Visit: Payer: Self-pay

## 2020-05-05 DIAGNOSIS — M25562 Pain in left knee: Secondary | ICD-10-CM | POA: Diagnosis not present

## 2020-05-05 DIAGNOSIS — M25561 Pain in right knee: Secondary | ICD-10-CM | POA: Diagnosis not present

## 2020-05-05 MED ORDER — BUPIVACAINE HCL 0.25 % IJ SOLN
6.0000 mL | INTRAMUSCULAR | Status: AC | PRN
Start: 2020-05-05 — End: 2020-05-05
  Administered 2020-05-05: 6 mL via INTRA_ARTICULAR

## 2020-05-05 MED ORDER — LIDOCAINE HCL 1 % IJ SOLN
3.0000 mL | INTRAMUSCULAR | Status: AC | PRN
  Administered 2020-05-05: 3 mL

## 2020-05-05 NOTE — Progress Notes (Signed)
Office Visit Note   Patient: Lauren [name]           Date of Birth: January 28, 1946           MRN: [id number] Visit Date: 05/05/2020              Requested by: Biagio Borg, MD [address] [place],  [place] [address] PCP: Biagio Borg, MD   Assessment & Plan: Visit Diagnoses:  1. Pain in both knees, unspecified chronicity   2. Mechanical pain of left knee   3. Acute pain of right knee     Plan: Since patient continues have ongoing left knee pain with some feeling mechanical symptoms that has failed conservative treatment I recommend getting an MRI to rule out medial meniscal tear.  Patient will follow-up with Dr. Lorin Mercy after completion to discuss results and further treatment options.  Regards to her right knee recommend conservative management with intra-articular injection there.  At patient consent right knee was prepped with Betadine and Marcaine/betamethasone injection performed.  May use ice off and on the right knee as needed.  Follow-Up Instructions: Return in about 3 weeks (around 05/26/2020) for With Dr. Lorin Mercy to review right knee MRI.   Orders:  Orders Placed This Encounter  Procedures  . Large Joint Inj  . XR Knee 1-2 Views Right   No orders of the defined types were placed in this encounter.     Procedures: Large Joint Inj: R knee on 05/05/2020 10:30 AM Indications: pain Details: 25 G 1.5 in needle, anteromedial approach Medications: 3 mL lidocaine 1 %; 6 mL bupivacaine 0.25 % Outcome: tolerated well, no immediate complications  6 cc of Marcaine and 1 cc of betamethasone intra-articular injection performed Consent was given by the patient. Patient was prepped and draped in the usual sterile fashion.       Clinical Data: No additional findings.   Subjective: Chief Complaint  Patient presents with  . Right Knee - Pain  . Left Knee - Pain    HPI 74 year old white female returns with complaints of bilateral knee pain.  States left knee  worsen right.  Some feeling mechanical symptoms in the left knee.  Seen by Dr. Lorin Mercy for both knees August 2021 and left knee intra-articular injection performed at that time.  Gave temporary improvement.  States that left knee pain extends from the anterior to posterior medial joint line.  Both knees worse when she is up and ambulating.  Patient also localizing right knee to the medial joint line.  No feeling of instability. Review of Systems No current cardiac pulmonary GI GU issues  Objective: Vital Signs: LMP 05/22/1996   Physical Exam HENT:     Head: Normocephalic.  Pulmonary:     Effort: No respiratory distress.  Musculoskeletal:     Comments: Gait is somewhat antalgic.  Bile knees good range of motion.  Left knee minimal swelling.  No palpable effusion.  Exquisitely tender at the medial joint line.  Positive Murray's test.  Ligaments are stable.  Right knee again minimal swelling without palpable effusion.  She is a moderately tender medial plica.  Less tender the medial joint line compared to the right.  Negative McMurray's test.  Neurological:     General: No focal deficit present.     Mental Status: She is alert and oriented to person, place, and time.     Ortho Exam  Specialty Comments:  No specialty comments available.  Imaging: No results found.  PMFS History: Patient Active Problem List   Diagnosis Date Noted  . Synovitis of left knee 01/14/2020  . Hyperglycemia 12/23/2018  . Hematuria 12/23/2018  . Vitamin D deficiency 10/25/2017  . Rash 05/24/2017  . Obesity 05/24/2017  . Hypothyroidism 10/26/2015  . Peripheral edema 10/22/2015  . Cervical dystonia 07/14/2015  . Weakness generalized 11/16/2014  . Insomnia 11/16/2014  . S/P cervical spinal fusion 11/05/2014  . Preop exam for internal medicine 10/14/2014  . Essential hypertension 10/14/2014  . Fracture, cervical vertebra ([place]) 08/30/2014  . Encounter for well adult exam with abnormal findings 10/08/2013   . Carotid stenosis 10/08/2013  . Depression   . Diverticulitis   . Anxiety   . Psoriasis   . Cervical disc disease   . Diverticulitis of colon (without mention of hemorrhage)(562.11) 05/10/2013   Past Medical History:  Diagnosis Date  . Anxiety   . Arthritis   . Carotid stenosis 10/08/2013  . Cervical disc disease   . Depression   . Diverticulitis   . Diverticulosis   . Frequent headaches   . Hypertension    does not now per pt.  . Hypothyroidism 10/26/2015  . Impaired glucose tolerance 10/08/2013   normal HgA1C  . Neck pain   . Psoriasis     Family History  Problem Relation Age of Onset  . Heart disease Mother   . Diabetes Mother   . Cervical cancer Mother   . Heart attack Mother   . Lung cancer Father   . Alcoholism Father   . Colon cancer Father   . Diabetes Brother   . Throat cancer Brother   . Cancer Other        lung cancer  . Heart disease Other   . Hypertension Other   . Arthritis Other   . Alcohol abuse Other   . Rectal cancer Neg Hx   . Stomach cancer Neg Hx     Past Surgical History:  Procedure Laterality Date  . BREAST BIOPSY Left 2011   benign   . CERVICAL FUSION     approx 1980  . CHOLECYSTECTOMY    . COLONOSCOPY    . POSTERIOR CERVICAL FUSION/FORAMINOTOMY N/A 11/05/2014   Procedure: Cervical four to Cervical seven  Posterior Cervical Fusion with lateral mass fixation;  Surgeon: Eustace Moore, MD;  Location: [facility];  Service: Neurosurgery;  Laterality: N/A;   C4 - C7 Posterior Cervical Fusion with lateral mass fixation  . TOTAL ABDOMINAL HYSTERECTOMY  1998   Fibroid  . UPPER GASTROINTESTINAL ENDOSCOPY     Social History   Occupational History  . Occupation: retired  Tobacco Use  . Smoking status: Never Smoker  . Smokeless tobacco: Never Used  Vaping Use  . Vaping Use: Never used  Substance and Sexual Activity  . Alcohol use: Yes    Alcohol/week: 0.0 standard drinks    Comment: 1 a month  . Drug use: No  . Sexual activity: Yes     Partners: Male    Birth control/protection: Surgical    Comment: Hyst

## 2020-05-17 ENCOUNTER — Other Ambulatory Visit: Payer: Self-pay | Admitting: Internal Medicine

## 2020-05-17 DIAGNOSIS — Z1231 Encounter for screening mammogram for malignant neoplasm of breast: Secondary | ICD-10-CM

## 2020-05-26 ENCOUNTER — Ambulatory Visit: Payer: Medicare Other | Admitting: Orthopaedic Surgery

## 2020-05-29 ENCOUNTER — Other Ambulatory Visit: Payer: Self-pay

## 2020-05-29 ENCOUNTER — Ambulatory Visit
Admission: RE | Admit: 2020-05-29 | Discharge: 2020-05-29 | Disposition: A | Discharge status: Home / Self Care | Payer: Medicare Other | Source: Ambulatory Visit | Attending: Surgery | Admitting: Surgery

## 2020-05-29 DIAGNOSIS — M25562 Pain in left knee: Secondary | ICD-10-CM

## 2020-05-29 DIAGNOSIS — M25561 Pain in right knee: Secondary | ICD-10-CM | POA: Diagnosis not present

## 2020-05-31 ENCOUNTER — Other Ambulatory Visit: Payer: Medicare Other

## 2020-06-01 ENCOUNTER — Other Ambulatory Visit: Payer: Self-pay

## 2020-06-01 ENCOUNTER — Encounter: Payer: Self-pay | Admitting: Orthopaedic Surgery

## 2020-06-01 ENCOUNTER — Telehealth: Payer: Self-pay | Admitting: Radiology

## 2020-06-01 ENCOUNTER — Ambulatory Visit: Payer: Medicare Other | Admitting: Orthopaedic Surgery

## 2020-06-01 VITALS — BP 135/78 | HR 71 | Ht 64.0 in | Wt 155.0 lb

## 2020-06-01 DIAGNOSIS — M2242 Chondromalacia patellae, left knee: Secondary | ICD-10-CM | POA: Diagnosis not present

## 2020-06-01 DIAGNOSIS — M25562 Pain in left knee: Secondary | ICD-10-CM | POA: Diagnosis not present

## 2020-06-01 DIAGNOSIS — M25561 Pain in right knee: Secondary | ICD-10-CM

## 2020-06-01 NOTE — Telephone Encounter (Signed)
Noted  

## 2020-06-01 NOTE — Telephone Encounter (Signed)
Could you please obtain authorization for gel injection left knee-[place]. Thanks.

## 2020-06-01 NOTE — Progress Notes (Signed)
Office Visit Note   Patient: Lauren [name]           Date of Birth: 09/24/1945           MRN: [id number] Visit Date: 06/01/2020              Requested by: Biagio Borg, MD [address] [place],  [place] [address] PCP: Biagio Borg, MD   Assessment & Plan: Visit Diagnoses:  1. Pain in both knees, unspecified chronicity   2. Chondromalacia patellae, left knee     Plan: Patient has some tearing and fraying posterior medial meniscus but no displaced fragment.  Reviewed MRI imaging.  We will set her up for some physical therapy.  We will seek approval for Visco injection to see if this gives her some relief left knee.  We reviewed the knee radiographs and MRI scan at this point she is not severe enough that surgery is indicated.  Recheck 6 weeks.  Follow-Up Instructions: Return in about 6 weeks (around 07/13/2020).   Orders:  Orders Placed This Encounter  Procedures  . Ambulatory referral to Physical Therapy   No orders of the defined types were placed in this encounter.     Procedures: No procedures performed   Clinical Data: No additional findings.   Subjective: Chief Complaint  Patient presents with  . Right Knee - Pain, Follow-up  . Left Knee - Follow-up, Pain    MRI review    HPI 75 year old female returns with bilateral knee pain worse on the left than right.  Previous injection right knee 05/05/2020 patient states really did not give her any relief she has been using Voltaren cream to her knees.  Mild swelling of the left knee.  She has to go down left foot first going downstairs and has to use the rail.  No true locking.  No fever or chills.  No falling.  Continued bilateral knee pain that bothers her with community ambulation.  MRI scan showed left knee medial meniscal posterior horn degeneration and fraying.  Patellofemoral compartment chondromalacia medial and patellofemoral compartment sparing in the lateral compartment and small joint  effusion.  Review of Systems past history of cervical disc problems not symptomatic currently.  Positive for hypertension under control with medication.  All other systems are noncontributory to HPI.   Objective: Vital Signs: BP 135/78   Pulse 71   Ht 5\' 4"  (1.626 m)   Wt 155 lb (70.3 kg)   LMP 05/22/1996   BMI 26.61 kg/m   Physical Exam Constitutional:      Appearance: She is well-developed.  HENT:     Head: Normocephalic.     Right Ear: External ear normal.     Left Ear: External ear normal.  Eyes:     Pupils: Pupils are equal, round, and reactive to light.  Neck:     Thyroid: No thyromegaly.     Trachea: No tracheal deviation.  Cardiovascular:     Rate and Rhythm: Normal rate.  Pulmonary:     Effort: Pulmonary effort is normal.  Abdominal:     Palpations: Abdomen is soft.  Skin:    General: Skin is warm and dry.  Neurological:     Mental Status: She is alert and oriented to person, place, and time.  Psychiatric:        Mood and Affect: Mood and affect normal.        Behavior: Behavior normal.     Ortho Exam patient has  mild 2+ knee effusion on the left.  Crepitus with knee flexion extension some medial joint line tenderness.  Knees reach full extension right and left.  Good flexion past 110 degrees right and left.  Specialty Comments:  No specialty comments available.  Imaging: CLINICAL DATA:  Chronic right knee pain.  No prior surgery.  EXAM: MRI OF THE LEFT KNEE WITHOUT CONTRAST  TECHNIQUE: Multiplanar, multisequence MR imaging of the knee was performed. No intravenous contrast was administered.  COMPARISON:  Right knee x-rays dated January 14, 2020.  FINDINGS: MENISCI  Medial meniscus: Degenerative signal in the posterior horn with undersurface fraying. No discrete tear.  Lateral meniscus:  Intact.  LIGAMENTS  Cruciates:  Intact ACL and PCL.  Collaterals: Medial collateral ligament is intact. Lateral collateral ligament complex is  intact.  CARTILAGE  Patellofemoral: Mild partial-thickness cartilage loss over the lateral patellar facet.  Medial:  Diffuse cartilage thinning without focal defect.  Lateral:  No chondral defect  Joint:  Small joint effusion.  Normal Hoffa's fat.  Popliteal Fossa: No Baker cyst. Intact popliteus tendon with mild tendinosis.  Extensor Mechanism: Intact quadriceps tendon and patellar tendon. Intact medial and lateral patellar retinaculum. Intact MPFL.  Bones: No focal marrow signal abnormality. No fracture or dislocation.  Other: None.  IMPRESSION: 1. Medial meniscus posterior horn degeneration and undersurface fraying without discrete tear. 2. Mild medial and patellofemoral compartment osteoarthritis. 3. Small joint effusion.   Electronically Signed   By: Titus Dubin M.D.   On: 05/30/2020 11:46    PMFS History: Patient Active Problem List   Diagnosis Date Noted  . Chondromalacia patellae, left knee 06/01/2020  . Synovitis of left knee 01/14/2020  . Hyperglycemia 12/23/2018  . Hematuria 12/23/2018  . Vitamin D deficiency 10/25/2017  . Rash 05/24/2017  . Obesity 05/24/2017  . Hypothyroidism 10/26/2015  . Peripheral edema 10/22/2015  . Cervical dystonia 07/14/2015  . Weakness generalized 11/16/2014  . Insomnia 11/16/2014  . S/P cervical spinal fusion 11/05/2014  . Preop exam for internal medicine 10/14/2014  . Essential hypertension 10/14/2014  . Fracture, cervical vertebra ([place]) 08/30/2014  . Encounter for well adult exam with abnormal findings 10/08/2013  . Carotid stenosis 10/08/2013  . Depression   . Diverticulitis   . Anxiety   . Psoriasis   . Cervical disc disease   . Diverticulitis of colon (without mention of hemorrhage)(562.11) 05/10/2013   Past Medical History:  Diagnosis Date  . Anxiety   . Arthritis   . Carotid stenosis 10/08/2013  . Cervical disc disease   . Depression   . Diverticulitis   . Diverticulosis   .  Frequent headaches   . Hypertension    does not now per pt.  . Hypothyroidism 10/26/2015  . Impaired glucose tolerance 10/08/2013   normal HgA1C  . Neck pain   . Psoriasis     Family History  Problem Relation Age of Onset  . Heart disease Mother   . Diabetes Mother   . Cervical cancer Mother   . Heart attack Mother   . Lung cancer Father   . Alcoholism Father   . Colon cancer Father   . Diabetes Brother   . Throat cancer Brother   . Cancer Other        lung cancer  . Heart disease Other   . Hypertension Other   . Arthritis Other   . Alcohol abuse Other   . Rectal cancer Neg Hx   . Stomach cancer Neg Hx  Past Surgical History:  Procedure Laterality Date  . BREAST BIOPSY Left 2011   benign   . CERVICAL FUSION     approx 1980  . CHOLECYSTECTOMY    . COLONOSCOPY    . POSTERIOR CERVICAL FUSION/FORAMINOTOMY N/A 11/05/2014   Procedure: Cervical four to Cervical seven  Posterior Cervical Fusion with lateral mass fixation;  Surgeon: Eustace Moore, MD;  Location: [facility];  Service: Neurosurgery;  Laterality: N/A;   C4 - C7 Posterior Cervical Fusion with lateral mass fixation  . TOTAL ABDOMINAL HYSTERECTOMY  1998   Fibroid  . UPPER GASTROINTESTINAL ENDOSCOPY     Social History   Occupational History  . Occupation: retired  Tobacco Use  . Smoking status: Never Smoker  . Smokeless tobacco: Never Used  Vaping Use  . Vaping Use: Never used  Substance and Sexual Activity  . Alcohol use: Yes    Alcohol/week: 0.0 standard drinks    Comment: 1 a month  . Drug use: No  . Sexual activity: Yes    Partners: Male    Birth control/protection: Surgical    Comment: Hyst

## 2020-06-04 ENCOUNTER — Ambulatory Visit: Payer: Medicare Other | Admitting: Orthopaedic Surgery

## 2020-06-04 ENCOUNTER — Telehealth: Payer: Self-pay | Admitting: Orthopaedic Surgery

## 2020-06-04 NOTE — Telephone Encounter (Signed)
Patient called asked if she got approved for the gel injection? The number to contact patient is [phone]

## 2020-06-08 ENCOUNTER — Other Ambulatory Visit: Payer: Self-pay

## 2020-06-08 ENCOUNTER — Telehealth: Payer: Self-pay

## 2020-06-08 MED ORDER — IBUPROFEN 800 MG PO TABS: 800.0000 mg | ORAL_TABLET | Freq: Two times a day (BID) | ORAL | 1 refills | Status: DC | PRN

## 2020-06-08 NOTE — Telephone Encounter (Signed)
Pt states that she is awaiting approval for gel injections and in the meanwhile she is in terrible pain and wants rx for Ibuprofen called into her pharm [facility] on [address] please. To call and advise. [phone]

## 2020-06-08 NOTE — Telephone Encounter (Signed)
Pt called with a phone number for [facility] foe visco injections [phone] advised that you were waiting for this info.

## 2020-06-08 NOTE — Telephone Encounter (Signed)
Ok motrin 800mg  po bid with food. # 60 refill times1. thanks

## 2020-06-08 NOTE — Telephone Encounter (Signed)
Patient aware this was called in for her  

## 2020-06-08 NOTE — Telephone Encounter (Signed)
Please advise 

## 2020-06-09 ENCOUNTER — Telehealth: Payer: Self-pay

## 2020-06-09 NOTE — Telephone Encounter (Signed)
Will call patient to discuss gel injections. ? ?

## 2020-06-09 NOTE — Telephone Encounter (Signed)
Will call patient to discuss process of gel injections.

## 2020-06-09 NOTE — Telephone Encounter (Signed)
Submitted VOB, Durolane, left knee.  

## 2020-06-09 NOTE — Telephone Encounter (Signed)
Called and left a VM concerning gel injection.  Advised patient to call back with any questions.

## 2020-06-09 NOTE — Telephone Encounter (Signed)
See previous message concerning gel injection.

## 2020-06-10 ENCOUNTER — Encounter: Payer: Self-pay | Admitting: Physical Therapy

## 2020-06-10 ENCOUNTER — Other Ambulatory Visit: Payer: Self-pay

## 2020-06-10 ENCOUNTER — Ambulatory Visit: Payer: Medicare Other | Admitting: Physical Therapy

## 2020-06-10 DIAGNOSIS — R600 Localized edema: Secondary | ICD-10-CM

## 2020-06-10 DIAGNOSIS — M25561 Pain in right knee: Secondary | ICD-10-CM

## 2020-06-10 DIAGNOSIS — M25562 Pain in left knee: Secondary | ICD-10-CM | POA: Diagnosis not present

## 2020-06-10 DIAGNOSIS — M6281 Muscle weakness (generalized): Secondary | ICD-10-CM | POA: Diagnosis not present

## 2020-06-10 DIAGNOSIS — R2689 Other abnormalities of gait and mobility: Secondary | ICD-10-CM | POA: Diagnosis not present

## 2020-06-10 DIAGNOSIS — G8929 Other chronic pain: Secondary | ICD-10-CM

## 2020-06-10 NOTE — Therapy (Addendum)
[facility] Physical Therapy [address] [place], [place], [address] Phone: [phone]   Fax:  [phone]  Physical Therapy Evaluation/Discharge  Patient Details  Name: Lauren [name] MRN: [id number] Date of Birth: [birth year] Referring Provider (PT): Rodell Perna, MD   Encounter Date: 06/10/2020   PT End of Session - 06/10/20 1151    Visit Number 1    Number of Visits 16    Date for PT Re-Evaluation 08/05/20    PT Start Time 0768    PT Stop Time 1121    PT Time Calculation (min) 49 min    Activity Tolerance Patient tolerated treatment well    Behavior During Therapy [facility] for tasks assessed/performed           Past Medical History:  Diagnosis Date  . Anxiety   . Arthritis   . Carotid stenosis 10/08/2013  . Cervical disc disease   . Depression   . Diverticulitis   . Diverticulosis   . Frequent headaches   . Hypertension    does not now per pt.  . Hypothyroidism 10/26/2015  . Impaired glucose tolerance 10/08/2013   normal HgA1C  . Neck pain   . Psoriasis     Past Surgical History:  Procedure Laterality Date  . BREAST BIOPSY Left 2011   benign   . CERVICAL FUSION     approx 1980  . CHOLECYSTECTOMY    . COLONOSCOPY    . POSTERIOR CERVICAL FUSION/FORAMINOTOMY N/A 11/05/2014   Procedure: Cervical four to Cervical seven  Posterior Cervical Fusion with lateral mass fixation;  Surgeon: Eustace Moore, MD;  Location: [facility];  Service: Neurosurgery;  Laterality: N/A;   C4 - C7 Posterior Cervical Fusion with lateral mass fixation  . TOTAL ABDOMINAL HYSTERECTOMY  1998   Fibroid  . UPPER GASTROINTESTINAL ENDOSCOPY      There were no vitals filed for this visit.    Subjective Assessment - 06/10/20 1125    Subjective Patient is a retired 75 y/o female that reports that she has had bilateral knee pain, (L worse than Right) for the past few months since May. She indicates the pain worsened after a weekend of pulling up carpet at their house  whie she was kneeling and crawling alot. Pain is currently 7/10 and finds that taking advil/tylenol does not touch her pain. Pain wakes her up every monring around 4am and she puts voltaren cream on which seems to be the only thing helping besides heat at times. She is able to complte her household activities and walk around but the pain is persistent and bothersome. She feels some knee crepitus with bending but pain is indicated on the medial side of the knee especially with extension. She has had injections in both knees and is awaiting approval for a gel injection for her Left knee.  She denies any radiating pain, N/T or B/B symptoms as well. Follow up with Dr. on 02/22    Limitations House hold activities    Diagnostic tests Lt knee MRI 05/2020 "Medial meniscus posterior horn degeneration and undersurface fraying without discrete tear. Mild medial and patellofemoral compartment osteoarthritis.    Patient Stated Goals decrease pain for ADLs    Currently in Pain? Yes    Pain Score 7     Pain Location Knee    Pain Orientation Left;Right;Medial    Pain Descriptors / Indicators Aching;Pressure;Tender    Pain Type Chronic pain    Pain Onset More than a month ago  Pain Frequency Constant    Aggravating Factors  knee extension    Pain Relieving Factors taking prescribed ibuprofen, voltaren gel, heat    Effect of Pain on Daily Activities can complete household activities but pain is bothersome and can be limiting at times              [facility] PT Assessment - 06/10/20 1138      Assessment   Medical Diagnosis Pain in both knees-chronic    Referring Provider (PT) Rodell Perna, MD    Onset Date/Surgical Date 10/09/19    Hand Dominance Right    Next MD Visit 07/13/20    Prior Therapy none      Precautions   Precautions None      Restrictions   Weight Bearing Restrictions No      Balance Screen   Has the patient fallen in the past 6 months No    Has the patient had a decrease in activity  level because of a fear of falling?  No    Is the patient reluctant to leave their home because of a fear of falling?  No      Home Environment   Living Environment Private residence    Living Arrangements Spouse/significant other    Type of [place] Access Level entry    [place] One level    Additional Comments patient has 8 stairs from home into backyard with railings      Prior Function   Level of [place] Retired      Associate Professor   Overall Cognitive Status Within Functional Limits for tasks assessed      Observation/Other Assessments   Focus on Therapeutic Outcomes (FOTO)  functional intake score 48%, predicted 63%      Observation/Other Assessments-Edema    Edema Circumferential   L-14.75, R-14.0 at joint line     Sensation   Light Touch Appears Intact      ROM / Strength   AROM / PROM / Strength AROM;PROM;Strength      AROM   AROM Assessment Site Knee    Right/Left Knee Left;Right    Right Knee Extension --   WFL   Right Knee Flexion 126    Left Knee Extension 10   painful   Left Knee Flexion 121      Strength   Strength Assessment Site Knee;Hip;Ankle    Right/Left Hip Right;Left    Right Hip Flexion 4+/5    Right Hip External Rotation  4/5    Right Hip Internal Rotation 3+/5    Right Hip ABduction 4/5    Right Hip ADduction 3/5    Left Hip Flexion 4-/5    Left Hip External Rotation 4-/5    Left Hip Internal Rotation 3/5    Left Hip ABduction 4/5    Left Hip ADduction 3/5    Right/Left Knee Right;Left    Right Knee Flexion 4+/5    Right Knee Extension 3+/5    Left Knee Flexion 4-/5    Left Knee Extension 3/5    Right/Left Ankle Right;Left    Right Ankle Dorsiflexion 4+/5    Left Ankle Dorsiflexion 3-/5   weakness and slow ability to reach full DF     Palpation   Patella mobility patella seemed to sit higher and track laterally slightly. Patient had difficulty relaxing after quad contraction to properly assess  patellar mobility proximally/distally    Palpation comment slight edema around  patella, TTP on medial aspect of patella, tight IT band upon palpation on L side      Ambulation/Gait   Gait Pattern Decreased stance time - left;Decreased hip/knee flexion - left;Decreased dorsiflexion - left                      Objective measurements completed on examination: See above findings.               PT Education - 06/10/20 1151    Education Details HEP, POC, KT tape    Person(s) Educated Patient    Methods Explanation;Demonstration;Tactile cues;Handout;Verbal cues    Comprehension Verbalized understanding;Returned demonstration            PT Short Term Goals - 06/10/20 1208      PT SHORT TERM GOAL #1   Title Patient will have decreased pain to under 4/10 for improved function and QOL    Baseline pain is 7/10 or higher    Time 4    Period Weeks    Status New    Target Date 07/08/20      PT SHORT TERM GOAL #2   Title Patient will demonstrate independence and compliance with HEP    Baseline HEP given 06/10/20    Time 2    Period Weeks    Status New    Target Date 06/24/20             PT Long Term Goals - 06/10/20 1209      PT LONG TERM GOAL #1   Title Patient will improve hip/knee strength to 4 or 4+/5 for all measurements for improved function of ADLsand leisure activities    Baseline strength 3/5-4/5 range    Time 8    Period Weeks    Status New    Target Date 08/05/20      PT LONG TERM GOAL #2   Title Patient will demonstrate the ability to complete household activities with pain managed below 4/10    Baseline pain is 7/10 sometimes worse    Time 8    Period Weeks    Status New    Target Date 08/05/20      PT LONG TERM GOAL #3   Title Patient will show improved FOTO score to goal of 63% by discharge    Baseline baseline score: 48    Time 8    Period Weeks    Status New    Target Date 08/05/20      PT LONG TERM GOAL #4   Title Patient  will have improved gait pattern from increased ROM and increased strength for increased function    Baseline decreased stance time and weakness in leg causing decreased hip flexion    Time 8    Period Weeks    Status New    Target Date 08/05/20      PT LONG TERM GOAL #5   Title Patient will demonstrate increased Left sided ROM to be equal with Right side    Baseline L ext missing 10 degrees, L flex missing 5 degrees    Time 6    Period Weeks    Status New    Target Date 07/22/20                  Plan - 06/10/20 1154    Clinical Impression Statement Patient presents with chronic bilateral knee pain, worse on L side.Recent imaging indicates slight medial meniscus fray on L knee and  mild medial PF OA which is consistent with her symptoms. Patient demonstrates overall weakness in leg strength with notable deficits with adductor strength. She also has decreased motion with both flexion and extension in comparison with her R side and notes pain with extension, releif with flexion. Lateral IT band tightness paired with abductor strength > adductor strength creating a lateral patellar shift consistent with her concerns of medial knee pain. Applied medial shift KT tape to try to assist with pain and lateral tracking, check in with pt. next visit. Patient will benefit from skilled PT to progress hip/knee/ankle strengthening and Passive and active stretching for increased motion and decreased pain.    Personal Factors and Comorbidities Age    Examination-Activity Limitations Stand;Other;Caring for Others;Squat;Stairs    Examination-Participation Restrictions Cleaning;Community Activity;Other;Meal Prep    Stability/Clinical Decision Making Stable/Uncomplicated    Clinical Decision Making Low    Rehab Potential Good    PT Frequency 2x / week    PT Duration 8 weeks    PT Treatment/Interventions Aquatic Therapy;Electrical Stimulation;Cryotherapy;Biofeedback;Moist Heat;Neuromuscular  re-education;Balance training;Therapeutic exercise;Therapeutic activities;Functional mobility training;Stair training;Gait training;Passive range of motion;Manual techniques;Vasopneumatic Device;Compression bandaging;Taping    PT Next Visit Plan check in with KT taping, HEP, functional mobility stairs with adductor focus, mini squats, progress HEP    PT Home Exercise Plan Access code: [facility]    Consulted and Agree with Plan of Care Patient           Patient will benefit from skilled therapeutic intervention in order to improve the following deficits and impairments:  Abnormal gait,Decreased coordination,Decreased range of motion,Decreased endurance,Decreased activity tolerance,Pain,Impaired flexibility,Decreased mobility,Decreased strength,Increased edema  Visit Diagnosis: Chronic pain of left knee  Chronic pain of right knee  Localized edema  Muscle weakness (generalized)  Other abnormalities of gait and mobility  Decreased range of motion of left knee     Problem List Patient Active Problem List   Diagnosis Date Noted  . Chondromalacia patellae, left knee 06/01/2020  . Synovitis of left knee 01/14/2020  . Hyperglycemia 12/23/2018  . Hematuria 12/23/2018  . Vitamin D deficiency 10/25/2017  . Rash 05/24/2017  . Obesity 05/24/2017  . Hypothyroidism 10/26/2015  . Peripheral edema 10/22/2015  . Cervical dystonia 07/14/2015  . Weakness generalized 11/16/2014  . Insomnia 11/16/2014  . S/P cervical spinal fusion 11/05/2014  . Preop exam for internal medicine 10/14/2014  . Essential hypertension 10/14/2014  . Fracture, cervical vertebra ([place]) 08/30/2014  . Encounter for well adult exam with abnormal findings 10/08/2013  . Carotid stenosis 10/08/2013  . Depression   . Diverticulitis   . Anxiety   . Psoriasis   . Cervical disc disease   . Diverticulitis of colon (without mention of hemorrhage)(562.11) 05/10/2013    Glenetta Hew, SPT 06/10/2020, 12:26 PM    PHYSICAL THERAPY DISCHARGE SUMMARY  Visits from Start of Care: 1  Current functional level related to goals / functional outcomes: See note   Remaining deficits: See note   Education / Equipment: HEP Plan: Patient agrees to discharge.  Patient goals were partially met. Patient is being discharged due to not returning since the last visit.  ?????     Scot Jun, PT, DPT, OCS, ATC 10/19/20  10:38 AM     [facility] Physical Therapy [address] [place], [place], [address] Phone: [phone]   Fax:  [phone]  Name: Lauren [name] MRN: [id number] Date of Birth: March 05, 1946

## 2020-06-10 NOTE — Patient Instructions (Signed)
Access Code: [facility] URL: [email or web address] Date: 06/10/2020 Prepared by: Elsie Ra  Exercises ITB Stretch at [facility] - 2 x daily - 7 x weekly - 1 sets - 3 reps - 30 hold Seated Hamstring Stretch - 2 x daily - 7 x weekly - 1 sets - 3 reps - 20-3030 hold Seated Knee Flexion Stretch - 2 x daily - 7 x weekly - 1 sets - 3 reps - 20-30 sec hold Seated Long Arc Quad with Hip Adduction - 2 x daily - 7 x weekly - 2 sets - 10 reps - 2-3 hold Supine Hip Adduction Isometric with Ball - 2 x daily - 7 x weekly - 2 sets - 10 reps - 5 sec hold Supine Bridge with Mini Swiss Ball Between Knees - 2 x daily - 7 x weekly - 2 sets - 10 reps - 5 hold

## 2020-06-11 ENCOUNTER — Telehealth: Payer: Self-pay

## 2020-06-11 NOTE — Telephone Encounter (Signed)
Called and left a VM for patient to call back to schedule for gel injection.

## 2020-06-11 NOTE — Telephone Encounter (Signed)
Approved, Durolane, left knee. Lauren [name] Patient will be responsible for 20% OOP. Co-pay of $30.00 No PA required

## 2020-06-14 ENCOUNTER — Encounter: Payer: Medicare Other | Admitting: Physical Therapy

## 2020-06-16 ENCOUNTER — Encounter: Payer: Medicare Other | Admitting: Physical Therapy

## 2020-06-22 ENCOUNTER — Telehealth: Payer: Self-pay | Admitting: Orthopaedic Surgery

## 2020-06-22 NOTE — Telephone Encounter (Signed)
Pt called stating the ibuprofen isn't helping her leg pain at all and she can't take it anymore so she would like a CB to further discuss   [phone]

## 2020-06-22 NOTE — Telephone Encounter (Signed)
I called patient. Durolane injection has been approved for left knee. Worked patient in to schedule tomorrow morning for injection.

## 2020-06-23 ENCOUNTER — Encounter: Payer: Self-pay | Admitting: Orthopaedic Surgery

## 2020-06-23 ENCOUNTER — Other Ambulatory Visit: Payer: Self-pay

## 2020-06-23 ENCOUNTER — Ambulatory Visit (INDEPENDENT_AMBULATORY_CARE_PROVIDER_SITE_OTHER): Payer: Medicare Other | Admitting: Orthopaedic Surgery

## 2020-06-23 ENCOUNTER — Ambulatory Visit
Admission: RE | Admit: 2020-06-23 | Discharge: 2020-06-23 | Disposition: A | Discharge status: Home / Self Care | Payer: Medicare Other | Source: Ambulatory Visit | Attending: Internal Medicine | Admitting: Internal Medicine

## 2020-06-23 VITALS — Ht 64.0 in | Wt 155.0 lb

## 2020-06-23 DIAGNOSIS — Z1231 Encounter for screening mammogram for malignant neoplasm of breast: Secondary | ICD-10-CM | POA: Diagnosis not present

## 2020-06-23 DIAGNOSIS — M1712 Unilateral primary osteoarthritis, left knee: Secondary | ICD-10-CM | POA: Diagnosis not present

## 2020-06-23 DIAGNOSIS — M2242 Chondromalacia patellae, left knee: Secondary | ICD-10-CM

## 2020-06-23 MED ORDER — LIDOCAINE HCL 1 % IJ SOLN
0.5000 mL | INTRAMUSCULAR | Status: AC | PRN
  Administered 2020-06-23: .5 mL

## 2020-06-23 MED ORDER — SODIUM HYALURONATE 60 MG/3ML IX PRSY
60.0000 mg | PREFILLED_SYRINGE | INTRA_ARTICULAR | Status: AC | PRN
  Administered 2020-06-23: 60 mg via INTRA_ARTICULAR

## 2020-06-23 NOTE — Progress Notes (Signed)
Office Visit Note   Patient: Lauren [name]           Date of Birth: November 28, 1945           MRN: [id number] Visit Date: 06/23/2020              Requested by: Biagio Borg, MD [address] [place],  [place] [address] PCP: Biagio Borg, MD   Assessment & Plan: Visit Diagnoses:  1. Chondromalacia patellae, left knee   2. Unilateral primary osteoarthritis, left knee     Plan: Left knee injection performed which she tolerated and can ambulate after the injection.  She can return if she has ongoing problems in 6 weeks.  Follow-Up Instructions: Return in about 6 weeks (around 08/04/2020).   Orders:  Orders Placed This Encounter  Procedures  . Large Joint Inj   No orders of the defined types were placed in this encounter.     Procedures: Large Joint Inj: L knee on 06/23/2020 4:00 PM Indications: joint swelling and pain Details: 22 G 1.5 in needle, anterolateral approach  Arthrogram: No  Medications: 0.5 mL lidocaine 1 %; 60 mg Sodium Hyaluronate 60 MG/3ML Outcome: tolerated well, no immediate complications Procedure, treatment alternatives, risks and benefits explained, specific risks discussed. Consent was given by the patient. Immediately prior to procedure a time out was called to verify the correct patient, procedure, equipment, support staff and site/side marked as required. Patient was prepped and draped in the usual sterile fashion.       Clinical Data: No additional findings.   Subjective: Chief Complaint  Patient presents with  . Left Knee - Pain    Durolane injection left knee    HPI78 yo female here for single left knee Durolane injection  For left knee OA.   Review of Systems unchanged.   Objective: Vital Signs: Ht 5\' 4"  (1.626 m)   Wt 155 lb (70.3 kg)   LMP 05/22/1996   BMI 26.61 kg/m   Physical Exam unchanged.  Ortho Exam no reaction from previous injection site cortisone.  Trace left knee effusion.  Negative logroll the  hips.  Specialty Comments:  No specialty comments available.  Imaging: MM 3D SCREEN BREAST BILATERAL  Result Date: 06/23/2020 CLINICAL DATA:  Screening. EXAM: DIGITAL SCREENING BILATERAL MAMMOGRAM WITH TOMO AND CAD COMPARISON:  Previous exam(s). ACR Breast Density Category c: The breast tissue is heterogeneously dense, which may obscure small masses. FINDINGS: There are no findings suspicious for malignancy. The images were evaluated with computer-aided detection. IMPRESSION: No mammographic evidence of malignancy. A result letter of this screening mammogram will be mailed directly to the patient. RECOMMENDATION: Screening mammogram in one year. (Code:SM-B-01Y) BI-RADS CATEGORY  1: Negative. Electronically Signed   By: Valentino Saxon MD   On: 06/23/2020 13:50     PMFS History: Patient Active Problem List   Diagnosis Date Noted  . Unilateral primary osteoarthritis, left knee 06/23/2020  . Chondromalacia patellae, left knee 06/01/2020  . Synovitis of left knee 01/14/2020  . Hyperglycemia 12/23/2018  . Hematuria 12/23/2018  . Vitamin D deficiency 10/25/2017  . Rash 05/24/2017  . Obesity 05/24/2017  . Hypothyroidism 10/26/2015  . Peripheral edema 10/22/2015  . Cervical dystonia 07/14/2015  . Weakness generalized 11/16/2014  . Insomnia 11/16/2014  . S/P cervical spinal fusion 11/05/2014  . Preop exam for internal medicine 10/14/2014  . Essential hypertension 10/14/2014  . Fracture, cervical vertebra ([place]) 08/30/2014  . Encounter for well adult exam with abnormal findings 10/08/2013  .  Carotid stenosis 10/08/2013  . Depression   . Diverticulitis   . Anxiety   . Psoriasis   . Cervical disc disease   . Diverticulitis of colon (without mention of hemorrhage)(562.11) 05/10/2013   Past Medical History:  Diagnosis Date  . Anxiety   . Arthritis   . Carotid stenosis 10/08/2013  . Cervical disc disease   . Depression   . Diverticulitis   . Diverticulosis   . Frequent headaches    . Hypertension    does not now per pt.  . Hypothyroidism 10/26/2015  . Impaired glucose tolerance 10/08/2013   normal HgA1C  . Neck pain   . Psoriasis     Family History  Problem Relation Age of Onset  . Heart disease Mother   . Diabetes Mother   . Cervical cancer Mother   . Heart attack Mother   . Lung cancer Father   . Alcoholism Father   . Colon cancer Father   . Diabetes Brother   . Throat cancer Brother   . Cancer Other        lung cancer  . Heart disease Other   . Hypertension Other   . Arthritis Other   . Alcohol abuse Other   . Rectal cancer Neg Hx   . Stomach cancer Neg Hx     Past Surgical History:  Procedure Laterality Date  . BREAST BIOPSY Left 2011   benign   . CERVICAL FUSION     approx 1980  . CHOLECYSTECTOMY    . COLONOSCOPY    . POSTERIOR CERVICAL FUSION/FORAMINOTOMY N/A 11/05/2014   Procedure: Cervical four to Cervical seven  Posterior Cervical Fusion with lateral mass fixation;  Surgeon: Eustace Moore, MD;  Location: [facility];  Service: Neurosurgery;  Laterality: N/A;   C4 - C7 Posterior Cervical Fusion with lateral mass fixation  . TOTAL ABDOMINAL HYSTERECTOMY  1998   Fibroid  . UPPER GASTROINTESTINAL ENDOSCOPY     Social History   Occupational History  . Occupation: retired  Tobacco Use  . Smoking status: Never Smoker  . Smokeless tobacco: Never Used  Vaping Use  . Vaping Use: Never used  Substance and Sexual Activity  . Alcohol use: Yes    Alcohol/week: 0.0 standard drinks    Comment: 1 a month  . Drug use: No  . Sexual activity: Yes    Partners: Male    Birth control/protection: Surgical    Comment: Hyst

## 2020-06-25 ENCOUNTER — Encounter: Payer: Medicare Other | Admitting: Physical Therapy

## 2020-06-28 ENCOUNTER — Encounter: Payer: Medicare Other | Admitting: Physical Therapy

## 2020-07-01 ENCOUNTER — Encounter: Payer: Medicare Other | Admitting: Physical Therapy

## 2020-07-05 ENCOUNTER — Encounter: Payer: Medicare Other | Admitting: Physical Therapy

## 2020-07-07 ENCOUNTER — Encounter: Payer: Medicare Other | Admitting: Physical Therapy

## 2020-07-13 ENCOUNTER — Ambulatory Visit: Payer: Medicare Other | Admitting: Orthopaedic Surgery

## 2020-08-01 ENCOUNTER — Other Ambulatory Visit: Payer: Self-pay | Admitting: Orthopaedic Surgery

## 2020-08-02 NOTE — Telephone Encounter (Signed)
Ok to fill 

## 2020-08-04 ENCOUNTER — Ambulatory Visit: Payer: Medicare Other | Admitting: Orthopaedic Surgery

## 2020-08-04 ENCOUNTER — Ambulatory Visit (INDEPENDENT_AMBULATORY_CARE_PROVIDER_SITE_OTHER): Payer: Medicare Other | Admitting: Orthopaedic Surgery

## 2020-08-04 VITALS — BP 122/70 | HR 79 | Ht 64.0 in | Wt 155.0 lb

## 2020-08-04 DIAGNOSIS — M1712 Unilateral primary osteoarthritis, left knee: Secondary | ICD-10-CM

## 2020-08-04 DIAGNOSIS — M2242 Chondromalacia patellae, left knee: Secondary | ICD-10-CM | POA: Diagnosis not present

## 2020-08-04 NOTE — Progress Notes (Signed)
Office Visit Note   Patient: Lauren [name]           Date of Birth: [birth year]           MRN: [id number] Visit Date: 08/04/2020              Requested by: Biagio Borg, MD [address] [place],   [address] PCP: Biagio Borg, MD   Assessment & Plan: Visit Diagnoses:  1. Chondromalacia patellae, left knee   2. Unilateral primary osteoarthritis, left knee     Plan: We reviewed x-rays today as well as MRI findings.  She does not have significant enough changes and enough problems with activities of daily living to consider any surgical options.  She can return when she has increased symptoms.  She will call if she develops any locking.  She is happy with the results of treatment.  Follow-Up Instructions: No follow-ups on file.   Orders:  No orders of the defined types were placed in this encounter.  No orders of the defined types were placed in this encounter.     Procedures: No procedures performed   Clinical Data: No additional findings.   Subjective: Chief Complaint  Patient presents with  . Left Knee - Follow-up    HPI 75 year old female returns with ongoing problems with left knee pain and right knee pain primarily medial.  She has some problems with stairs mild.  She is taking Voltaren twice a day with meals no problems with her stomach.  No kidney problems.  She is also use some Voltaren topical gel that she got over-the-counter over her knees primarily the medial joint line.  She is walking much better and states the injection gave her significant improvement.  She mentioned she like to learn to two-step now.  Review of Systems 14 point system update unchanged.   Objective: Vital Signs: BP 122/70   Pulse 79   Ht 5\' 4"  (1.626 m)   Wt 155 lb (70.3 kg)   LMP 05/22/1996   BMI 26.61 kg/m   Physical Exam Constitutional:      Appearance: She is well-developed.  HENT:     Head: Normocephalic.     Right Ear: External ear normal.     Left  Ear: External ear normal.  Eyes:     Pupils: Pupils are equal, round, and reactive to light.  Neck:     Thyroid: No thyromegaly.     Trachea: No tracheal deviation.  Cardiovascular:     Rate and Rhythm: Normal rate.  Pulmonary:     Effort: Pulmonary effort is normal.  Abdominal:     Palpations: Abdomen is soft.  Skin:    General: Skin is warm and dry.  Neurological:     Mental Status: She is alert and oriented to person, place, and time.  Psychiatric:        Behavior: Behavior normal.     Ortho Exam patient has mild medial joint line tenderness mild crepitus patellofemoral loading.  Knee reaches full extension normal heel toe gait.  Rapid gait sequence without limping.  Negative logroll the hips.  Pulses are normal no pitting edema.  Specialty Comments:  No specialty comments available.  Imaging: No results found.   PMFS History: Patient Active Problem List   Diagnosis Date Noted  . Unilateral primary osteoarthritis, left knee 06/23/2020  . Chondromalacia patellae, left knee 06/01/2020  . Synovitis of left knee 01/14/2020  . Hyperglycemia 12/23/2018  . Hematuria 12/23/2018  .  Vitamin D deficiency 10/25/2017  . Rash 05/24/2017  . Obesity 05/24/2017  . Hypothyroidism 10/26/2015  . Peripheral edema 10/22/2015  . Cervical dystonia 07/14/2015  . Weakness generalized 11/16/2014  . Insomnia 11/16/2014  . S/P cervical spinal fusion 11/05/2014  . Preop exam for internal medicine 10/14/2014  . Essential hypertension 10/14/2014  . Fracture, cervical vertebra ([place]) 08/30/2014  . Encounter for well adult exam with abnormal findings 10/08/2013  . Carotid stenosis 10/08/2013  . Depression   . Diverticulitis   . Anxiety   . Psoriasis   . Cervical disc disease   . Diverticulitis of colon (without mention of hemorrhage)(562.11) 05/10/2013   Past Medical History:  Diagnosis Date  . Anxiety   . Arthritis   . Carotid stenosis 10/08/2013  . Cervical disc disease   .  Depression   . Diverticulitis   . Diverticulosis   . Frequent headaches   . Hypertension    does not now per pt.  . Hypothyroidism 10/26/2015  . Impaired glucose tolerance 10/08/2013   normal HgA1C  . Neck pain   . Psoriasis     Family History  Problem Relation Age of Onset  . Heart disease Mother   . Diabetes Mother   . Cervical cancer Mother   . Heart attack Mother   . Lung cancer Father   . Alcoholism Father   . Colon cancer Father   . Diabetes Brother   . Throat cancer Brother   . Cancer Other        lung cancer  . Heart disease Other   . Hypertension Other   . Arthritis Other   . Alcohol abuse Other   . Rectal cancer Neg Hx   . Stomach cancer Neg Hx     Past Surgical History:  Procedure Laterality Date  . BREAST BIOPSY Left 2011   benign   . CERVICAL FUSION     approx 1980  . CHOLECYSTECTOMY    . COLONOSCOPY    . POSTERIOR CERVICAL FUSION/FORAMINOTOMY N/A 11/05/2014   Procedure: Cervical four to Cervical seven  Posterior Cervical Fusion with lateral mass fixation;  Surgeon: Eustace Moore, MD;  Location: [facility];  Service: Neurosurgery;  Laterality: N/A;   C4 - C7 Posterior Cervical Fusion with lateral mass fixation  . TOTAL ABDOMINAL HYSTERECTOMY  1998   Fibroid  . UPPER GASTROINTESTINAL ENDOSCOPY     Social History   Occupational History  . Occupation: retired  Tobacco Use  . Smoking status: Never Smoker  . Smokeless tobacco: Never Used  Vaping Use  . Vaping Use: Never used  Substance and Sexual Activity  . Alcohol use: Yes    Alcohol/week: 0.0 standard drinks    Comment: 1 a month  . Drug use: No  . Sexual activity: Yes    Partners: Male    Birth control/protection: Surgical    Comment: Hyst

## 2020-08-18 ENCOUNTER — Encounter: Payer: Self-pay | Admitting: Orthopaedic Surgery

## 2020-08-18 ENCOUNTER — Other Ambulatory Visit: Payer: Self-pay

## 2020-08-18 ENCOUNTER — Ambulatory Visit (INDEPENDENT_AMBULATORY_CARE_PROVIDER_SITE_OTHER): Payer: Medicare Other | Admitting: Orthopaedic Surgery

## 2020-08-18 VITALS — BP 129/75 | HR 72 | Ht 64.0 in | Wt 155.0 lb

## 2020-08-18 DIAGNOSIS — M1712 Unilateral primary osteoarthritis, left knee: Secondary | ICD-10-CM

## 2020-08-18 DIAGNOSIS — M2242 Chondromalacia patellae, left knee: Secondary | ICD-10-CM

## 2020-08-18 NOTE — Progress Notes (Signed)
Office Visit Note   Patient: Lauren [name]           Date of Birth: February 07, 1946           MRN: [id number] Visit Date: 08/18/2020              Requested by: Biagio Borg, MD [address] [place],  [place] [address] PCP: Biagio Borg, MD   Assessment & Plan: Visit Diagnoses:  1. Chondromalacia patellae, left knee   2. Unilateral primary osteoarthritis, left knee     Plan: We will recheck her in 6 months.  She will continue to work on quad strengthening with straight leg raising.  Return gel.  Intermittent ice.  Pathophysiology discussed MRI results reviewed again.  Return if she develops locking.  Recheck 6 months  Follow-Up Instructions: Return in about 6 months (around 02/18/2021).   Orders:  No orders of the defined types were placed in this encounter.  No orders of the defined types were placed in this encounter.     Procedures: No procedures performed   Clinical Data: No additional findings.   Subjective: Chief Complaint  Patient presents with  . Left Knee - Pain    HPI 75 year old female returns with ongoing problems with left knee pain primarily medial side of her knee.  She had some increased tightness on Sunday difficulty walking.  Denies pain laterally.  No hip pain no numbness or tingling in her feet.  Patient's use Voltaren topical gel which is improved her symptoms.  MRI scan 05/30/2020 showed medial meniscus posterior degenerative undersurface fraying without discrete tear.  Mild medial and patellofemoral compartment osteoarthritis with small joint effusion.  Review of Systems updated unchanged.   Objective: Vital Signs: BP 129/75   Pulse 72   Ht 5\' 4"  (1.626 m)   Wt 155 lb (70.3 kg)   LMP 05/22/1996   BMI 26.61 kg/m   Physical Exam Constitutional:      Appearance: She is well-developed.  HENT:     Head: Normocephalic.     Right Ear: External ear normal.     Left Ear: External ear normal.  Eyes:     Pupils: Pupils are equal,  round, and reactive to light.  Neck:     Thyroid: No thyromegaly.     Trachea: No tracheal deviation.  Cardiovascular:     Rate and Rhythm: Normal rate.  Pulmonary:     Effort: Pulmonary effort is normal.  Abdominal:     Palpations: Abdomen is soft.  Skin:    General: Skin is warm and dry.  Neurological:     Mental Status: She is alert and oriented to person, place, and time.  Psychiatric:        Behavior: Behavior normal.     Ortho Exam negative logroll the hips full range of motion of the knees.  There is some medial joint line tenderness left knee and some pain with patellofemoral loading.  No patellar subluxation.  Pes bursa popliteal region is normal.  Specialty Comments:  No specialty comments available.  Imaging: No results found.   PMFS History: Patient Active Problem List   Diagnosis Date Noted  . Unilateral primary osteoarthritis, left knee 06/23/2020  . Chondromalacia patellae, left knee 06/01/2020  . Synovitis of left knee 01/14/2020  . Hyperglycemia 12/23/2018  . Hematuria 12/23/2018  . Vitamin D deficiency 10/25/2017  . Rash 05/24/2017  . Obesity 05/24/2017  . Hypothyroidism 10/26/2015  . Peripheral edema 10/22/2015  . Cervical  dystonia 07/14/2015  . Weakness generalized 11/16/2014  . Insomnia 11/16/2014  . S/P cervical spinal fusion 11/05/2014  . Preop exam for internal medicine 10/14/2014  . Essential hypertension 10/14/2014  . Fracture, cervical vertebra ([place]) 08/30/2014  . Encounter for well adult exam with abnormal findings 10/08/2013  . Carotid stenosis 10/08/2013  . Depression   . Diverticulitis   . Anxiety   . Psoriasis   . Cervical disc disease   . Diverticulitis of colon (without mention of hemorrhage)(562.11) 05/10/2013   Past Medical History:  Diagnosis Date  . Anxiety   . Arthritis   . Carotid stenosis 10/08/2013  . Cervical disc disease   . Depression   . Diverticulitis   . Diverticulosis   . Frequent headaches   .  Hypertension    does not now per pt.  . Hypothyroidism 10/26/2015  . Impaired glucose tolerance 10/08/2013   normal HgA1C  . Neck pain   . Psoriasis     Family History  Problem Relation Age of Onset  . Heart disease Mother   . Diabetes Mother   . Cervical cancer Mother   . Heart attack Mother   . Lung cancer Father   . Alcoholism Father   . Colon cancer Father   . Diabetes Brother   . Throat cancer Brother   . Cancer Other        lung cancer  . Heart disease Other   . Hypertension Other   . Arthritis Other   . Alcohol abuse Other   . Rectal cancer Neg Hx   . Stomach cancer Neg Hx     Past Surgical History:  Procedure Laterality Date  . BREAST BIOPSY Left 2011   benign   . CERVICAL FUSION     approx 1980  . CHOLECYSTECTOMY    . COLONOSCOPY    . POSTERIOR CERVICAL FUSION/FORAMINOTOMY N/A 11/05/2014   Procedure: Cervical four to Cervical seven  Posterior Cervical Fusion with lateral mass fixation;  Surgeon: Eustace Moore, MD;  Location: [facility];  Service: Neurosurgery;  Laterality: N/A;   C4 - C7 Posterior Cervical Fusion with lateral mass fixation  . TOTAL ABDOMINAL HYSTERECTOMY  1998   Fibroid  . UPPER GASTROINTESTINAL ENDOSCOPY     Social History   Occupational History  . Occupation: retired  Tobacco Use  . Smoking status: Never Smoker  . Smokeless tobacco: Never Used  Vaping Use  . Vaping Use: Never used  Substance and Sexual Activity  . Alcohol use: Yes    Alcohol/week: 0.0 standard drinks    Comment: 1 a month  . Drug use: No  . Sexual activity: Yes    Partners: Male    Birth control/protection: Surgical    Comment: Hyst

## 2020-08-23 DIAGNOSIS — Z23 Encounter for immunization: Secondary | ICD-10-CM | POA: Diagnosis not present

## 2020-09-05 ENCOUNTER — Encounter: Payer: Self-pay | Admitting: Orthopaedic Surgery

## 2020-09-07 ENCOUNTER — Telehealth: Payer: Self-pay | Admitting: Orthopaedic Surgery

## 2020-09-07 NOTE — Telephone Encounter (Signed)
Received medical records request from patient

## 2020-09-09 ENCOUNTER — Other Ambulatory Visit: Payer: Self-pay | Admitting: Internal Medicine

## 2020-09-09 NOTE — Telephone Encounter (Signed)
Please refill as per office routine med refill policy (all routine meds refilled for 3 mo or monthly per pt preference up to one year from last visit, then month to month grace period for 3 mo, then further med refills will have to be denied)  

## 2020-09-10 NOTE — Telephone Encounter (Signed)
Ok to fill 

## 2020-09-11 DIAGNOSIS — M25562 Pain in left knee: Secondary | ICD-10-CM | POA: Diagnosis not present

## 2020-09-21 ENCOUNTER — Other Ambulatory Visit: Payer: Self-pay | Admitting: Orthopaedic Surgery

## 2020-09-21 NOTE — Telephone Encounter (Signed)
Please advise 

## 2020-10-01 DIAGNOSIS — G8918 Other acute postprocedural pain: Secondary | ICD-10-CM | POA: Diagnosis not present

## 2020-10-01 DIAGNOSIS — M948X6 Other specified disorders of cartilage, lower leg: Secondary | ICD-10-CM | POA: Diagnosis not present

## 2020-10-01 DIAGNOSIS — M2242 Chondromalacia patellae, left knee: Secondary | ICD-10-CM | POA: Diagnosis not present

## 2020-10-01 DIAGNOSIS — S83242A Other tear of medial meniscus, current injury, left knee, initial encounter: Secondary | ICD-10-CM | POA: Diagnosis not present

## 2020-10-01 DIAGNOSIS — M23222 Derangement of posterior horn of medial meniscus due to old tear or injury, left knee: Secondary | ICD-10-CM | POA: Diagnosis not present

## 2020-10-11 ENCOUNTER — Encounter (INDEPENDENT_AMBULATORY_CARE_PROVIDER_SITE_OTHER): Payer: Medicare Other | Admitting: Ophthalmology

## 2020-10-18 ENCOUNTER — Encounter: Payer: Self-pay | Admitting: Internal Medicine

## 2020-10-19 ENCOUNTER — Other Ambulatory Visit: Payer: Self-pay | Admitting: Internal Medicine

## 2020-10-19 MED ORDER — ZOLPIDEM TARTRATE 5 MG PO TABS: 5.0000 mg | ORAL_TABLET | Freq: Every evening | ORAL | 1 refills | Status: DC | PRN

## 2020-11-16 ENCOUNTER — Other Ambulatory Visit: Payer: Self-pay | Admitting: Orthopaedic Surgery

## 2020-11-16 NOTE — Telephone Encounter (Signed)
Please advise 

## 2020-11-23 DIAGNOSIS — Z9889 Other specified postprocedural states: Secondary | ICD-10-CM | POA: Diagnosis not present

## 2021-01-11 ENCOUNTER — Other Ambulatory Visit: Payer: Self-pay | Admitting: Orthopaedic Surgery

## 2021-01-11 NOTE — Telephone Encounter (Signed)
Please advise 

## 2021-01-20 ENCOUNTER — Ambulatory Visit: Payer: Medicare Other | Admitting: Physician Assistant

## 2021-02-24 ENCOUNTER — Other Ambulatory Visit: Payer: Self-pay | Admitting: Internal Medicine

## 2021-02-26 ENCOUNTER — Other Ambulatory Visit: Payer: Self-pay | Admitting: Internal Medicine

## 2021-02-27 ENCOUNTER — Encounter: Payer: Self-pay | Admitting: Internal Medicine

## 2021-02-28 MED ORDER — ALPRAZOLAM 1 MG PO TABS: ORAL_TABLET | ORAL | 0 refills | Status: DC

## 2021-03-03 DIAGNOSIS — D3131 Benign neoplasm of right choroid: Secondary | ICD-10-CM | POA: Diagnosis not present

## 2021-03-16 ENCOUNTER — Other Ambulatory Visit: Payer: Self-pay | Admitting: Orthopaedic Surgery

## 2021-03-17 DIAGNOSIS — Z23 Encounter for immunization: Secondary | ICD-10-CM | POA: Diagnosis not present

## 2021-03-25 DIAGNOSIS — H43813 Vitreous degeneration, bilateral: Secondary | ICD-10-CM | POA: Diagnosis not present

## 2021-03-25 DIAGNOSIS — H35372 Puckering of macula, left eye: Secondary | ICD-10-CM | POA: Diagnosis not present

## 2021-03-25 DIAGNOSIS — H2513 Age-related nuclear cataract, bilateral: Secondary | ICD-10-CM | POA: Diagnosis not present

## 2021-04-26 ENCOUNTER — Other Ambulatory Visit: Payer: Self-pay

## 2021-04-26 ENCOUNTER — Ambulatory Visit (INDEPENDENT_AMBULATORY_CARE_PROVIDER_SITE_OTHER): Payer: Medicare Other | Admitting: Internal Medicine

## 2021-04-26 ENCOUNTER — Encounter: Payer: Self-pay | Admitting: Internal Medicine

## 2021-04-26 VITALS — BP 114/60 | HR 72 | Temp 99.5°F | Resp 16 | Ht 64.0 in | Wt 148.0 lb

## 2021-04-26 DIAGNOSIS — E538 Deficiency of other specified B group vitamins: Secondary | ICD-10-CM | POA: Diagnosis not present

## 2021-04-26 DIAGNOSIS — Z0001 Encounter for general adult medical examination with abnormal findings: Secondary | ICD-10-CM | POA: Diagnosis not present

## 2021-04-26 DIAGNOSIS — F32A Depression, unspecified: Secondary | ICD-10-CM

## 2021-04-26 DIAGNOSIS — I10 Essential (primary) hypertension: Secondary | ICD-10-CM | POA: Diagnosis not present

## 2021-04-26 DIAGNOSIS — E7800 Pure hypercholesterolemia, unspecified: Secondary | ICD-10-CM | POA: Diagnosis not present

## 2021-04-26 DIAGNOSIS — R739 Hyperglycemia, unspecified: Secondary | ICD-10-CM | POA: Diagnosis not present

## 2021-04-26 DIAGNOSIS — E039 Hypothyroidism, unspecified: Secondary | ICD-10-CM | POA: Diagnosis not present

## 2021-04-26 DIAGNOSIS — E559 Vitamin D deficiency, unspecified: Secondary | ICD-10-CM | POA: Diagnosis not present

## 2021-04-26 LAB — URINALYSIS, ROUTINE W REFLEX MICROSCOPIC
Bilirubin Urine: NEGATIVE
Hgb urine dipstick: NEGATIVE
Ketones, ur: NEGATIVE
Leukocytes,Ua: NEGATIVE
Nitrite: NEGATIVE
RBC / HPF: NONE SEEN (ref 0–?)
Specific Gravity, Urine: 1.015 (ref 1.000–1.030)
Total Protein, Urine: NEGATIVE
Urine Glucose: NEGATIVE
Urobilinogen, UA: 0.2 (ref 0.0–1.0)
pH: 6.5 (ref 5.0–8.0)

## 2021-04-26 LAB — CBC WITH DIFFERENTIAL/PLATELET
Basophils Absolute: 0 10*3/uL (ref 0.0–0.1)
Basophils Relative: 0.6 % (ref 0.0–3.0)
Eosinophils Absolute: 0.1 10*3/uL (ref 0.0–0.7)
Eosinophils Relative: 1.4 % (ref 0.0–5.0)
HCT: 39.4 % (ref 36.0–46.0)
Hemoglobin: 13.1 g/dL (ref 12.0–15.0)
Lymphocytes Relative: 27.4 % (ref 12.0–46.0)
Lymphs Abs: 1.4 10*3/uL (ref 0.7–4.0)
MCHC: 33.1 g/dL (ref 30.0–36.0)
MCV: 95.2 fl (ref 78.0–100.0)
Monocytes Absolute: 0.4 10*3/uL (ref 0.1–1.0)
Monocytes Relative: 8 % (ref 3.0–12.0)
Neutro Abs: 3.3 10*3/uL (ref 1.4–7.7)
Neutrophils Relative %: 62.6 % (ref 43.0–77.0)
Platelets: 223 10*3/uL (ref 150.0–400.0)
RBC: 4.14 Mil/uL (ref 3.87–5.11)
RDW: 13.4 % (ref 11.5–15.5)
WBC: 5.2 10*3/uL (ref 4.0–10.5)

## 2021-04-26 LAB — BASIC METABOLIC PANEL
BUN: 13 mg/dL (ref 6–23)
CO2: 32 mEq/L (ref 19–32)
Calcium: 9.5 mg/dL (ref 8.4–10.5)
Chloride: 100 mEq/L (ref 96–112)
Creatinine, Ser: 0.84 mg/dL (ref 0.40–1.20)
GFR: 67.62 mL/min (ref >60.00)
Glucose, Bld: 90 mg/dL (ref 70–99)
Potassium: 3.9 mEq/L (ref 3.5–5.1)
Sodium: 139 mEq/L (ref 135–145)

## 2021-04-26 LAB — LIPID PANEL
Cholesterol: 155 mg/dL (ref 0–200)
HDL: 29.7 mg/dL — ABNORMAL LOW (ref >39.00)
NonHDL: 125.74
Total CHOL/HDL Ratio: 5
Triglycerides: 240 mg/dL — ABNORMAL HIGH (ref 0.0–149.0)
VLDL: 48 mg/dL — ABNORMAL HIGH (ref 0.0–40.0)

## 2021-04-26 LAB — HEPATIC FUNCTION PANEL
ALT: 15 U/L (ref 0–35)
AST: 20 U/L (ref 0–37)
Albumin: 4.2 g/dL (ref 3.5–5.2)
Alkaline Phosphatase: 75 U/L (ref 39–117)
Bilirubin, Direct: 0.1 mg/dL (ref 0.0–0.3)
Total Bilirubin: 0.6 mg/dL (ref 0.2–1.2)
Total Protein: 7 g/dL (ref 6.0–8.3)

## 2021-04-26 LAB — HEMOGLOBIN A1C: Hgb A1c MFr Bld: 5.9 % (ref 4.6–6.5)

## 2021-04-26 LAB — TSH: TSH: 3.81 u[IU]/mL (ref 0.35–5.50)

## 2021-04-26 LAB — VITAMIN B12: Vitamin B-12: 216 pg/mL (ref 211–911)

## 2021-04-26 LAB — VITAMIN D 25 HYDROXY (VIT D DEFICIENCY, FRACTURES): VITD: 49.05 ng/mL (ref 30.00–100.00)

## 2021-04-26 LAB — LDL CHOLESTEROL, DIRECT: Direct LDL: 75 mg/dL

## 2021-04-26 NOTE — Progress Notes (Signed)
Patient ID: Lauren [name], female   DOB: 1946-05-06, 75 y.o.   MRN: [id number]         Chief Complaint:: wellness exam and hld, low vit d, depression       HPI:  Lauren [name] is a 75 y.o. female here for wellness exam; declines covid booster, o/w up to date                        Also s/p left knee arthroscopy with medial meniscus tear, now doing well without further pain, swelling or falls.  Trying to follow lower chol diet.  Now taking Vit D.  Admits to mild worsening depression with low mood for 2-3 mo, without SI or HI, and no significant panic or worsening anxiety.  Pt denies chest pain, increased sob or doe, wheezing, orthopnea, PND, increased LE swelling, palpitations, dizziness or syncope.   Pt denies polydipsia, polyuria, or new focal neuro s/s.   Pt denies fever, wt loss, night sweats, loss of appetite, or other constitutional symptoms  No other new complaints   Denies hyper or hypo thyroid symptoms such as voice, skin or hair change.   Wt Readings from Last 3 Encounters:  04/26/21 148 lb (67.1 kg)  08/18/20 155 lb (70.3 kg)  08/04/20 155 lb (70.3 kg)   BP Readings from Last 3 Encounters:  04/26/21 114/60  08/18/20 129/75  08/04/20 122/70   Immunization History  Administered Date(s) Administered   Fluad Quad(high Dose 65+) 02/07/2019   Influenza, High Dose Seasonal PF 02/26/2020   Influenza-Unspecified 02/20/2015, 02/26/2016, 03/22/2018, 03/14/2021   PFIZER(Purple Top)SARS-COV-2 Vaccination 06/28/2019, 07/22/2019, 02/16/2020, 02/28/2021   Pneumococcal Conjugate-13 10/08/2013   Pneumococcal Polysaccharide-23 10/14/2014   Tdap 05/22/2016   Zoster Recombinat (Shingrix) 07/04/2017, 10/31/2017   Zoster, Live 10/15/2014   There are no preventive care reminders to display for this patient.     Past Medical History:  Diagnosis Date   Anxiety    Arthritis    Carotid stenosis 10/08/2013   Cervical disc disease    Depression    Diverticulitis     Diverticulosis    Frequent headaches    Hypertension    does not now per pt.   Hypothyroidism 10/26/2015   Impaired glucose tolerance 10/08/2013   normal HgA1C   Neck pain    Psoriasis    Past Surgical History:  Procedure Laterality Date   BREAST BIOPSY Left 2011   benign    CERVICAL FUSION     approx 1980   CHOLECYSTECTOMY     COLONOSCOPY     POSTERIOR CERVICAL FUSION/FORAMINOTOMY N/A 11/05/2014   Procedure: Cervical four to Cervical seven  Posterior Cervical Fusion with lateral mass fixation;  Surgeon: Eustace Moore, MD;  Location: [facility];  Service: Neurosurgery;  Laterality: N/A;   C4 - C7 Posterior Cervical Fusion with lateral mass fixation   TOTAL ABDOMINAL HYSTERECTOMY  1998   Fibroid   UPPER GASTROINTESTINAL ENDOSCOPY      reports that she has never smoked. She has never used smokeless tobacco. She reports current alcohol use. She reports that she does not use drugs. family history includes Alcohol abuse in an other family member; Alcoholism in her father; Arthritis in an other family member; Cancer in an other family member; Cervical cancer in her mother; Colon cancer in her father; Diabetes in her brother and mother; Heart attack in her mother; Heart disease in her mother and another family member; Hypertension in an  other family member; Lung cancer in her father; Throat cancer in her brother. Allergies  Allergen Reactions   Celebrex [Celecoxib] Itching and Rash   Current Outpatient Medications on File Prior to Visit  Medication Sig Dispense Refill   ALPRAZolam (XANAX) 1 MG tablet TAKE 1/2 TO 1 TABLET BY MOUTH TWICE DAILY AS NEEDED 60 tablet 0   escitalopram (LEXAPRO) 10 MG tablet TAKE 1 TABLET BY MOUTH  DAILY 90 tablet 0   hydrochlorothiazide (MICROZIDE) 12.5 MG capsule TAKE 1 CAPSULE BY MOUTH  DAILY 90 capsule 3   ibuprofen (ADVIL) 800 MG tablet TAKE 1 TABLET(800 MG) BY MOUTH TWICE DAILY AS NEEDED 60 tablet 1   levothyroxine (SYNTHROID) 50 MCG tablet TAKE 1 TABLET BY  MOUTH  DAILY 90 tablet 0   TYRVAYA 0.03 MG/ACT SOLN Place into both nostrils.     zolpidem (AMBIEN) 5 MG tablet Take 1 tablet (5 mg total) by mouth at bedtime as needed. for sleep 90 tablet 1   amLODipine (NORVASC) 5 MG tablet TAKE 1 TABLET BY MOUTH  DAILY (Patient not taking: Reported on 04/26/2021) 90 tablet 0   aspirin EC 325 MG tablet Take 325 mg by mouth daily. (Patient not taking: Reported on 04/26/2021)     RESTASIS 0.05 % ophthalmic emulsion 1 drop 2 (two) times daily. (Patient not taking: Reported on 04/26/2021)     No current facility-administered medications on file prior to visit.        ROS:  All others reviewed and negative.  Objective        PE:  BP 114/60 (BP Location: Right Arm, Patient Position: Sitting, Cuff Size: Large)   Pulse 72   Temp 99.5 F (37.5 C) (Oral)   Resp 16   Ht 5\' 4"  (1.626 m)   Wt 148 lb (67.1 kg)   LMP 05/22/1996   BMI 25.40 kg/m                 Constitutional: Pt appears in NAD               HENT: Head: NCAT.                Right Ear: External ear normal.                 Left Ear: External ear normal.                Eyes: . Pupils are equal, round, and reactive to light. Conjunctivae and EOM are normal               Nose: without d/c or deformity               Neck: Neck supple. Gross normal ROM               Cardiovascular: Normal rate and regular rhythm.                 Pulmonary/Chest: Effort normal and breath sounds without rales or wheezing.                Abd:  Soft, NT, ND, + BS, no organomegaly               Neurological: Pt is alert. At baseline orientation, motor grossly intact               Skin: Skin is warm. No rashes, no other new lesions, LE edema - none  Psychiatric: Pt behavior is normal without agitation , mild depressed affect  Micro: none  Cardiac tracings I have personally interpreted today:  none  Pertinent Radiological findings (summarize): none   Lab Results  Component Value Date   WBC 5.2 04/26/2021    HGB 13.1 04/26/2021   HCT 39.4 04/26/2021   PLT 223.0 04/26/2021   GLUCOSE 90 04/26/2021   CHOL 155 04/26/2021   TRIG 240.0 (H) 04/26/2021   HDL 29.70 (L) 04/26/2021   LDLDIRECT 75.0 04/26/2021   LDLCALC 90 10/26/2017   ALT 15 04/26/2021   AST 20 04/26/2021   NA 139 04/26/2021   K 3.9 04/26/2021   CL 100 04/26/2021   CREATININE 0.84 04/26/2021   BUN 13 04/26/2021   CO2 32 04/26/2021   TSH 3.81 04/26/2021   INR 0.97 10/27/2014   HGBA1C 5.9 04/26/2021   Assessment/Plan:  Lauren [name] is a 75 y.o. White or Caucasian [1] female with  has a past medical history of Anxiety, Arthritis, Carotid stenosis (10/08/2013), Cervical disc disease, Depression, Diverticulitis, Diverticulosis, Frequent headaches, Hypertension, Hypothyroidism (10/26/2015), Impaired glucose tolerance (10/08/2013), Neck pain, and Psoriasis.  Encounter for well adult exam with abnormal findings Age and sex appropriate education and counseling updated with regular exercise and diet Referrals for preventative services - none needed Immunizations addressed - declines covid booster Smoking counseling  - none needed Evidence for depression or other mood disorder - mild depression - declines tx Most recent labs reviewed. I have personally reviewed and have noted: 1) the patient's medical and social history 2) The patient's current medications and supplements 3) The patient's height, weight, and BMI have been recorded in the chart   Depression Uncontrolled at least mild to mod, declines tx such as SSRI or counseling at this time  Essential hypertension BP Readings from Last 3 Encounters:  04/26/21 114/60  08/18/20 129/75  08/04/20 122/70   Stable, pt to continue medical treatment hct, norvasc   Hyperglycemia Lab Results  Component Value Date   HGBA1C 5.9 04/26/2021   Stable, pt to continue current medical treatment diet   Hypothyroidism Lab Results  Component Value Date   TSH 3.81 04/26/2021    Stable, pt to continue levothyroxine   Vitamin D deficiency Last vitamin D Lab Results  Component Value Date   VD25OH 49.05 04/26/2021   Stable, cont oral replacement   HLD (hyperlipidemia) Lab Results  Component Value Date   [place] 90 10/26/2017   Uncontrolled, goal ldl < 70, pt to cont low chol diet, declines statin for now  Followup: Return in about 1 year (around 04/26/2022).  Cathlean Cower, MD 05/01/2021 4:39 PM [facility]

## 2021-04-26 NOTE — Patient Instructions (Signed)
Please take OTC Vitamin D3 at 1000 units per day, indefinitely  Please continue all other medications as before, and refills have been done if requested.  Please have the pharmacy call with any other refills you may need.  Please continue your efforts at being more active, low cholesterol diet, and weight control.  You are otherwise up to date with prevention measures today.  Please keep your appointments with your specialists as you may have planned  Please go to the LAB at the blood drawing area for the tests to be done  You will be contacted by phone if any changes need to be made immediately.  Otherwise, you will receive a letter about your results with an explanation, but please check with MyChart first.   Please remember to sign up for MyChart if you have not done so, as this will be important to you in the future with finding out test results, communicating by private email, and scheduling acute appointments online when needed.  Please make an Appointment to return for your 1 year visit, or sooner if needed, with Lab testing by Appointment as well, to be done about 3-5 days before at the [facility] (so this is for TWO appointments - please see the scheduling desk as you leave)  Due to the ongoing Covid 19 pandemic, our lab now requires an appointment for any labs done at our office.  If you need labs done and do not have an appointment, please call our office ahead of time to schedule before presenting to the lab for your testing.

## 2021-05-01 ENCOUNTER — Encounter: Payer: Self-pay | Admitting: Internal Medicine

## 2021-05-01 DIAGNOSIS — E785 Hyperlipidemia, unspecified: Secondary | ICD-10-CM | POA: Insufficient documentation

## 2021-05-01 NOTE — Assessment & Plan Note (Signed)
BP Readings from Last 3 Encounters:  04/26/21 114/60  08/18/20 129/75  08/04/20 122/70   Stable, pt to continue medical treatment hct, norvasc

## 2021-05-01 NOTE — Assessment & Plan Note (Signed)
Lab Results  Component Value Date   LDLCALC 90 10/26/2017   Uncontrolled, goal ldl < 70, pt to cont low chol diet, declines statin for now

## 2021-05-01 NOTE — Assessment & Plan Note (Addendum)
Lab Results  Component Value Date   TSH 3.81 04/26/2021   Stable, pt to continue levothyroxine

## 2021-05-01 NOTE — Assessment & Plan Note (Signed)
Last vitamin D Lab Results  Component Value Date   VD25OH 49.05 04/26/2021   Stable, cont oral replacement  

## 2021-05-01 NOTE — Assessment & Plan Note (Addendum)
Age and sex appropriate education and counseling updated with regular exercise and diet Referrals for preventative services - none needed Immunizations addressed - declines covid booster Smoking counseling  - none needed Evidence for depression or other mood disorder - mild depression - declines tx Most recent labs reviewed. I have personally reviewed and have noted: 1) the patient's medical and social history 2) The patient's current medications and supplements 3) The patient's height, weight, and BMI have been recorded in the chart

## 2021-05-01 NOTE — Assessment & Plan Note (Signed)
Uncontrolled at least mild to mod, declines tx such as SSRI or counseling at this time

## 2021-05-01 NOTE — Assessment & Plan Note (Signed)
Lab Results  Component Value Date   HGBA1C 5.9 04/26/2021   Stable, pt to continue current medical treatment diet

## 2021-05-10 ENCOUNTER — Other Ambulatory Visit: Payer: Self-pay | Admitting: Internal Medicine

## 2021-05-10 DIAGNOSIS — H35342 Macular cyst, hole, or pseudohole, left eye: Secondary | ICD-10-CM | POA: Diagnosis not present

## 2021-05-10 DIAGNOSIS — H18413 Arcus senilis, bilateral: Secondary | ICD-10-CM | POA: Diagnosis not present

## 2021-05-10 DIAGNOSIS — H25013 Cortical age-related cataract, bilateral: Secondary | ICD-10-CM | POA: Diagnosis not present

## 2021-05-10 DIAGNOSIS — H2512 Age-related nuclear cataract, left eye: Secondary | ICD-10-CM | POA: Diagnosis not present

## 2021-05-10 DIAGNOSIS — H2513 Age-related nuclear cataract, bilateral: Secondary | ICD-10-CM | POA: Diagnosis not present

## 2021-05-10 DIAGNOSIS — H25043 Posterior subcapsular polar age-related cataract, bilateral: Secondary | ICD-10-CM | POA: Diagnosis not present

## 2021-05-15 ENCOUNTER — Other Ambulatory Visit: Payer: Self-pay | Admitting: Orthopaedic Surgery

## 2021-05-17 ENCOUNTER — Other Ambulatory Visit: Payer: Self-pay

## 2021-05-17 ENCOUNTER — Other Ambulatory Visit: Payer: Self-pay | Admitting: Internal Medicine

## 2021-05-17 ENCOUNTER — Ambulatory Visit: Payer: Medicare Other

## 2021-05-18 ENCOUNTER — Other Ambulatory Visit: Payer: Self-pay | Admitting: Internal Medicine

## 2021-05-18 NOTE — Telephone Encounter (Signed)
Please refill as per office routine med refill policy (all routine meds to be refilled for 3 mo or monthly (per pt preference) up to one year from last visit, then month to month grace period for 3 mo, then further med refills will have to be denied) ? ?

## 2021-05-25 ENCOUNTER — Ambulatory Visit (INDEPENDENT_AMBULATORY_CARE_PROVIDER_SITE_OTHER): Payer: Medicare Other

## 2021-05-25 ENCOUNTER — Other Ambulatory Visit: Payer: Self-pay | Admitting: Internal Medicine

## 2021-05-25 DIAGNOSIS — Z0000 Encounter for general adult medical examination without abnormal findings: Secondary | ICD-10-CM | POA: Diagnosis not present

## 2021-05-25 DIAGNOSIS — Z1231 Encounter for screening mammogram for malignant neoplasm of breast: Secondary | ICD-10-CM

## 2021-05-25 NOTE — Progress Notes (Signed)
I connected with Lauren [name] today by telephone and verified that I am speaking with the correct person using two identifiers. Location patient: home Location provider: work Persons participating in the virtual visit: patient, provider.   I discussed the limitations, risks, security and privacy concerns of performing an evaluation and management service by telephone and the availability of in person appointments. I also discussed with the patient that there may be a patient responsible charge related to this service. The patient expressed understanding and verbally consented to this telephonic visit.    Interactive audio and video telecommunications were attempted between this provider and patient, however failed, due to patient having technical difficulties OR patient did not have access to video capability.  We continued and completed visit with audio only.  Some vital signs may be absent or patient reported.   Time Spent with patient on telephone encounter: 40 minutes  Subjective:   Lauren [name] is a 76 y.o. female who presents for Medicare Annual (Subsequent) preventive examination.  Review of Systems     Cardiac Risk Factors include: advanced age (>63men, >17 women);family history of premature cardiovascular disease;hypertension     Objective:    There were no vitals filed for this visit. There is no height or weight on file to calculate BMI.  Advanced Directives 05/25/2021 04/09/2020 03/22/2019 03/10/2019 05/15/2017 09/17/2015 06/10/2015  Does Patient Have a Medical Advance Directive? Yes Yes Yes No No No No  Type of Advance Directive - Living will;Out of facility DNR (pink MOST or yellow form) Out of facility DNR (pink MOST or yellow form) - - - -  Does patient want to make changes to medical advance directive? No - Patient declined No - Patient declined - - - - -  Would patient like information on creating a medical advance directive? - - No - Patient declined No -  Patient declined No - Patient declined - No - patient declined information    Current Medications (verified) Outpatient Encounter Medications as of 05/25/2021  Medication Sig   amLODipine (NORVASC) 5 MG tablet TAKE 1 TABLET BY MOUTH  DAILY   levothyroxine (SYNTHROID) 50 MCG tablet TAKE 1 TABLET BY MOUTH  DAILY   ALPRAZolam (XANAX) 1 MG tablet TAKE 1/2 TO 1 TABLET BY MOUTH TWICE DAILY AS NEEDED   aspirin EC 325 MG tablet Take 325 mg by mouth daily. (Patient not taking: Reported on 04/26/2021)   escitalopram (LEXAPRO) 10 MG tablet TAKE 1 TABLET BY MOUTH  DAILY   hydrochlorothiazide (MICROZIDE) 12.5 MG capsule TAKE 1 CAPSULE BY MOUTH  DAILY   ibuprofen (ADVIL) 800 MG tablet TAKE 1 TABLET(800 MG) BY MOUTH TWICE DAILY AS NEEDED   RESTASIS 0.05 % ophthalmic emulsion 1 drop 2 (two) times daily. (Patient not taking: Reported on 04/26/2021)   TYRVAYA 0.03 MG/ACT SOLN Place into both nostrils.   zolpidem (AMBIEN) 5 MG tablet TAKE 1 TABLET BY MOUTH AT  BEDTIME AS NEEDED FOR SLEEP   No facility-administered encounter medications on file as of 05/25/2021.    Allergies (verified) Celebrex [celecoxib]   History: Past Medical History:  Diagnosis Date   Anxiety    Arthritis    Carotid stenosis 10/08/2013   Cervical disc disease    Depression    Diverticulitis    Diverticulosis    Frequent headaches    Hypertension    does not now per pt.   Hypothyroidism 10/26/2015   Impaired glucose tolerance 10/08/2013   normal HgA1C   Neck pain  Psoriasis    Past Surgical History:  Procedure Laterality Date   BREAST BIOPSY Left 2011   benign    CERVICAL FUSION     approx 1980   CHOLECYSTECTOMY     COLONOSCOPY     POSTERIOR CERVICAL FUSION/FORAMINOTOMY N/A 11/05/2014   Procedure: Cervical four to Cervical seven  Posterior Cervical Fusion with lateral mass fixation;  Surgeon: Eustace Moore, MD;  Location: [facility];  Service: Neurosurgery;  Laterality: N/A;   C4 - C7 Posterior Cervical Fusion with  lateral mass fixation   TOTAL ABDOMINAL HYSTERECTOMY  1998   Fibroid   UPPER GASTROINTESTINAL ENDOSCOPY     Family History  Problem Relation Age of Onset   Heart disease Mother    Diabetes Mother    Cervical cancer Mother    Heart attack Mother    Lung cancer Father    Alcoholism Father    Colon cancer Father    Diabetes Brother    Throat cancer Brother    Cancer Other        lung cancer   Heart disease Other    Hypertension Other    Arthritis Other    Alcohol abuse Other    Rectal cancer Neg Hx    Stomach cancer Neg Hx    Social History   Socioeconomic History   Marital status: Married    Spouse name: Not on file   Number of children: 2   Years of education: 12   Highest education level: Not on file  Occupational History   Occupation: retired  Tobacco Use   Smoking status: Never   Smokeless tobacco: Never  Vaping Use   Vaping Use: Never used  Substance and Sexual Activity   Alcohol use: Yes    Alcohol/week: 0.0 standard drinks    Comment: 1 a month   Drug use: No   Sexual activity: Yes    Partners: Male    Birth control/protection: Surgical    Comment: Hyst  Other Topics Concern   Not on file  Social History Narrative   Lives at home with husband and son.   Right-handed.   2 cups caffeine daily.   Social Determinants of Health   Financial Resource Strain: Low Risk    Difficulty of Paying Living Expenses: Not hard at all  Food Insecurity: No Food Insecurity   Worried About Charity fundraiser in the Last Year: Never true   [place] in the Last Year: Never true  Transportation Needs: No Transportation Needs   Lack of Transportation (Medical): No   Lack of Transportation (Non-Medical): No  Physical Activity: Inactive   Days of Exercise per Week: 0 days   Minutes of Exercise per Session: 0 min  Stress: No Stress Concern Present   Feeling of Stress : Not at all  Social Connections: Moderately Isolated   Frequency of Communication with Friends  and Family: More than three times a week   Frequency of Social Gatherings with Friends and Family: Never   Attends Religious Services: Never   Marine scientist or Organizations: No   Attends Music therapist: Never   Marital Status: Married    Tobacco Counseling Counseling given: Not Answered   Clinical Intake:  Pre-visit preparation completed: Yes  Pain : No/denies pain     Nutritional Risks: None Diabetes: No  How often do you need to have someone help you when you read instructions, pamphlets, or other written materials from your  doctor or pharmacy?: 1 - Never What is the last grade level you completed in school?: High School Graduate  Diabetic? No  Interpreter Needed?: No  Information entered by :: Lisette Abu, LPN   Activities of Daily Living In your present state of health, do you have any difficulty performing the following activities: 05/25/2021 04/26/2021  Hearing? N N  Vision? N N  Difficulty concentrating or making decisions? N N  Walking or climbing stairs? N N  Dressing or bathing? N N  Doing errands, shopping? N N  Preparing Food and eating ? N -  Using the Toilet? N -  In the past six months, have you accidently leaked urine? N -  Do you have problems with loss of bowel control? N -  Managing your Medications? N -  Managing your Finances? N -  Housekeeping or managing your Housekeeping? N -  Some recent data might be hidden    Patient Care Team: Biagio Borg, MD as PCP - General (Internal Medicine) Clydell Hakim, MD (Inactive) as Consulting Physician (Anesthesiology) [facility] as Consulting Physician (Optometry)  Indicate any recent [place] you may have received from other than [facility] providers in the past year (date may be approximate).     Assessment:   This is a routine wellness examination for Muna.  Hearing/Vision screen Hearing Screening - Comments:: Patient denied any hearing  difficulty.   No hearing aids.  Vision Screening - Comments:: Patient wears corrective glasses/contacts.  Eye exam done annually by: [facility]  Dietary issues and exercise activities discussed: Current Exercise Habits: The patient does not participate in regular exercise at present   Goals Addressed               This Visit's Progress     Patient Stated (pt-stated)        My goal is to lose 15 pounds by watching my diet and being physically active.      Depression Screen PHQ 2/9 Scores 05/25/2021 04/26/2021 04/26/2021 04/20/2020 04/09/2020 12/23/2018 10/25/2017  PHQ - 2 Score 1 1 0 4 0 1 1  PHQ- 9 Score - - - 10 - - -    Fall Risk Fall Risk  05/25/2021 04/26/2021 04/26/2021 04/09/2020 12/23/2018  Falls in the past year? 0 0 0 0 0  Number falls in past yr: 0 0 0 0 -  Injury with Fall? 0 0 0 0 -  Comment - - - - -  Risk for fall due to : No Fall Risks - - No Fall Risks -  Follow up - - - Falls evaluation completed -    FALL RISK PREVENTION PERTAINING TO THE HOME:  Any stairs in or around the home? No  If so, are there any without handrails? No  Home free of loose throw rugs in walkways, pet beds, electrical cords, etc? Yes  Adequate lighting in your home to reduce risk of falls? Yes   ASSISTIVE DEVICES UTILIZED TO PREVENT FALLS:  Life alert? No  Use of a cane, walker or w/c? No  Grab bars in the bathroom? Yes  Shower chair or bench in shower? No  Elevated toilet seat or a handicapped toilet? Yes   TIMED UP AND GO:  Was the test performed? No .  Length of time to ambulate 10 feet: n/a sec.   Gait steady and fast without use of assistive device  Cognitive Function: Normal cognitive status assessed by direct observation by this Nurse Health  Advisor. No abnormalities found.           Immunizations Immunization History  Administered Date(s) Administered   Fluad Quad(high Dose 65+) 02/07/2019   Influenza, High Dose Seasonal PF 02/26/2020   Influenza-Unspecified  02/20/2015, 02/26/2016, 03/22/2018, 03/14/2021   PFIZER(Purple Top)SARS-COV-2 Vaccination 06/28/2019, 07/22/2019, 02/16/2020, 02/28/2021   Pneumococcal Conjugate-13 10/08/2013   Pneumococcal Polysaccharide-23 10/14/2014   Tdap 05/22/2016   Zoster Recombinat (Shingrix) 07/04/2017, 10/31/2017   Zoster, Live 10/15/2014    TDAP status: Up to date  Flu Vaccine status: Up to date  Pneumococcal vaccine status: Up to date  Covid-19 vaccine status: Completed vaccines  Qualifies for Shingles Vaccine? Yes   Zostavax completed Yes   Shingrix Completed?: Yes  Screening Tests Health Maintenance  Topic Date Due   COVID-19 Vaccine (5 - Booster for Pfizer series) 04/25/2021   MAMMOGRAM  06/23/2021   TETANUS/TDAP  05/22/2026   Pneumonia Vaccine 61+ Years old  Completed   INFLUENZA VACCINE  Completed   DEXA SCAN  Completed   Hepatitis C Screening  Completed   Zoster Vaccines- Shingrix  Completed   HPV VACCINES  Aged Out   COLONOSCOPY (Pts 45-76yrs Insurance coverage will need to be confirmed)  [place] Maintenance Due  Topic Date Due   COVID-19 Vaccine (5 - Booster for [place] series) 04/25/2021    Colorectal cancer screening: No longer required.   Mammogram status: Completed 06/23/2020. Repeat every year  Bone Density status: Completed 04/23/2017. Results reflect: Bone density results: OSTEOPENIA. Repeat every 2-3 years.  Lung Cancer Screening: (Low Dose CT Chest recommended if Age 43-80 years, 30 pack-year currently smoking OR have quit w/in 15years.) does not qualify.   Lung Cancer Screening Referral: no  Additional Screening:  Hepatitis C Screening: does qualify; Completed yes  Vision Screening: Recommended annual ophthalmology exams for early detection of glaucoma and other disorders of the eye. Is the patient up to date with their annual eye exam?  Yes  Who is the provider or what is the name of the office in which the patient attends annual  eye exams? [facility] If pt is not established with a provider, would they like to be referred to a provider to establish care? No .   Dental Screening: Recommended annual dental exams for proper oral hygiene  Community Resource Referral / Chronic Care Management: CRR required this visit?  No   CCM required this visit?  No      Plan:     I have personally reviewed and noted the following in the patients chart:   Medical and social history Use of alcohol, tobacco or illicit drugs  Current medications and supplements including opioid prescriptions.  Functional ability and status Nutritional status Physical activity Advanced directives List of other physicians Hospitalizations, surgeries, and ER visits in previous 12 months Vitals Screenings to include cognitive, depression, and falls Referrals and appointments  In addition, I have reviewed and discussed with patient certain preventive protocols, quality metrics, and best practice recommendations. A written personalized care plan for preventive services as well as general preventive health recommendations were provided to patient.     Sheral Flow, LPN   07/24/3612   Nurse Notes:  Patient is cogitatively intact. There were no vitals filed for this visit. There is no height or weight on file to calculate BMI. Patient stated that she has no issues with gait or balance; does not use any assistive devices. Medications reviewed with patient; no opioid  use noted. Hearing Screening - Comments:: Patient denied any hearing difficulty.   No hearing aids.  Vision Screening - Comments:: Patient wears corrective glasses/contacts.  Eye exam done annually by: [facility]

## 2021-05-30 DIAGNOSIS — H2512 Age-related nuclear cataract, left eye: Secondary | ICD-10-CM | POA: Diagnosis not present

## 2021-05-30 DIAGNOSIS — H3581 Retinal edema: Secondary | ICD-10-CM | POA: Diagnosis not present

## 2021-05-30 DIAGNOSIS — H35372 Puckering of macula, left eye: Secondary | ICD-10-CM | POA: Diagnosis not present

## 2021-05-31 DIAGNOSIS — H35372 Puckering of macula, left eye: Secondary | ICD-10-CM | POA: Diagnosis not present

## 2021-06-07 DIAGNOSIS — H35372 Puckering of macula, left eye: Secondary | ICD-10-CM | POA: Diagnosis not present

## 2021-06-28 DIAGNOSIS — H2511 Age-related nuclear cataract, right eye: Secondary | ICD-10-CM | POA: Diagnosis not present

## 2021-07-13 ENCOUNTER — Ambulatory Visit
Admission: RE | Admit: 2021-07-13 | Discharge: 2021-07-13 | Disposition: A | Discharge status: Home / Self Care | Payer: Medicare Other | Source: Ambulatory Visit | Attending: Internal Medicine | Admitting: Internal Medicine

## 2021-07-13 ENCOUNTER — Other Ambulatory Visit: Payer: Self-pay

## 2021-07-13 DIAGNOSIS — Z1231 Encounter for screening mammogram for malignant neoplasm of breast: Secondary | ICD-10-CM | POA: Diagnosis not present

## 2021-07-14 ENCOUNTER — Other Ambulatory Visit: Payer: Self-pay | Admitting: Orthopaedic Surgery

## 2021-07-14 ENCOUNTER — Other Ambulatory Visit: Payer: Self-pay | Admitting: Internal Medicine

## 2021-07-14 DIAGNOSIS — R928 Other abnormal and inconclusive findings on diagnostic imaging of breast: Secondary | ICD-10-CM

## 2021-07-16 ENCOUNTER — Emergency Department (HOSPITAL_COMMUNITY): Payer: Medicare Other

## 2021-07-16 ENCOUNTER — Other Ambulatory Visit: Payer: Self-pay

## 2021-07-16 ENCOUNTER — Emergency Department (HOSPITAL_COMMUNITY)
Admission: EM | Admit: 2021-07-16 | Discharge: 2021-07-16 | Disposition: A | Discharge status: Home / Self Care | Payer: Medicare Other | Attending: Emergency Medicine | Admitting: Emergency Medicine

## 2021-07-16 ENCOUNTER — Encounter (HOSPITAL_COMMUNITY): Payer: Self-pay | Admitting: Emergency Medicine

## 2021-07-16 DIAGNOSIS — Z79899 Other long term (current) drug therapy: Secondary | ICD-10-CM | POA: Insufficient documentation

## 2021-07-16 DIAGNOSIS — Z7982 Long term (current) use of aspirin: Secondary | ICD-10-CM | POA: Diagnosis not present

## 2021-07-16 DIAGNOSIS — R519 Headache, unspecified: Secondary | ICD-10-CM | POA: Diagnosis not present

## 2021-07-16 DIAGNOSIS — G44209 Tension-type headache, unspecified, not intractable: Secondary | ICD-10-CM | POA: Insufficient documentation

## 2021-07-16 DIAGNOSIS — I10 Essential (primary) hypertension: Secondary | ICD-10-CM | POA: Diagnosis not present

## 2021-07-16 LAB — COMPREHENSIVE METABOLIC PANEL
ALT: 16 U/L (ref 0–44)
AST: 19 U/L (ref 15–41)
Albumin: 3.7 g/dL (ref 3.5–5.0)
Alkaline Phosphatase: 70 U/L (ref 38–126)
Anion gap: 9 (ref 5–15)
BUN: 15 mg/dL (ref 8–23)
CO2: 26 mmol/L (ref 22–32)
Calcium: 9.4 mg/dL (ref 8.9–10.3)
Chloride: 104 mmol/L (ref 98–111)
Creatinine, Ser: 0.82 mg/dL (ref 0.44–1.00)
GFR, Estimated: >60 mL/min (ref >60)
Glucose, Bld: 126 mg/dL — ABNORMAL HIGH (ref 70–99)
Potassium: 3.7 mmol/L (ref 3.5–5.1)
Sodium: 139 mmol/L (ref 135–145)
Total Bilirubin: 0.3 mg/dL (ref 0.3–1.2)
Total Protein: 6.7 g/dL (ref 6.5–8.1)

## 2021-07-16 LAB — CBC
HCT: 39.7 % (ref 36.0–46.0)
Hemoglobin: 12.9 g/dL (ref 12.0–15.0)
MCH: 32.3 pg (ref 26.0–34.0)
MCHC: 32.5 g/dL (ref 30.0–36.0)
MCV: 99.3 fL (ref 80.0–100.0)
Platelets: 209 10*3/uL (ref 150–400)
RBC: 4 MIL/uL (ref 3.87–5.11)
RDW: 13.2 % (ref 11.5–15.5)
WBC: 6.7 10*3/uL (ref 4.0–10.5)
nRBC: 0 % (ref 0.0–0.2)

## 2021-07-16 LAB — DIFFERENTIAL
Abs Immature Granulocytes: 0.03 10*3/uL (ref 0.00–0.07)
Basophils Absolute: 0.1 10*3/uL (ref 0.0–0.1)
Basophils Relative: 1 %
Eosinophils Absolute: 0.4 10*3/uL (ref 0.0–0.5)
Eosinophils Relative: 7 %
Immature Granulocytes: 1 %
Lymphocytes Relative: 29 %
Lymphs Abs: 2 10*3/uL (ref 0.7–4.0)
Monocytes Absolute: 0.5 10*3/uL (ref 0.1–1.0)
Monocytes Relative: 7 %
Neutro Abs: 3.7 10*3/uL (ref 1.7–7.7)
Neutrophils Relative %: 55 %

## 2021-07-16 LAB — PROTIME-INR
INR: 0.9 (ref 0.8–1.2)
Prothrombin Time: 12.3 seconds (ref 11.4–15.2)

## 2021-07-16 LAB — APTT: aPTT: 26 seconds (ref 24–36)

## 2021-07-16 MED ORDER — PROCHLORPERAZINE EDISYLATE 10 MG/2ML IJ SOLN
10.0000 mg | Freq: Once | INTRAMUSCULAR | Status: AC
  Administered 2021-07-16: 10 mg via INTRAVENOUS
  Filled 2021-07-16: qty 2

## 2021-07-16 MED ORDER — DIPHENHYDRAMINE HCL 50 MG/ML IJ SOLN
12.5000 mg | Freq: Once | INTRAMUSCULAR | Status: AC
  Administered 2021-07-16: 12.5 mg via INTRAVENOUS
  Filled 2021-07-16: qty 1

## 2021-07-16 MED ORDER — KETOROLAC TROMETHAMINE 15 MG/ML IJ SOLN
15.0000 mg | Freq: Once | INTRAMUSCULAR | Status: AC
  Administered 2021-07-16: 15 mg via INTRAVENOUS
  Filled 2021-07-16: qty 1

## 2021-07-16 MED ORDER — SODIUM CHLORIDE 0.9% FLUSH: 3.0000 mL | Freq: Once | INTRAVENOUS | Status: DC

## 2021-07-16 MED ORDER — TETRACAINE HCL 0.5 % OP SOLN
1.0000 [drp] | Freq: Once | OPHTHALMIC | Status: AC
  Administered 2021-07-16: 1 [drp] via OPHTHALMIC
  Filled 2021-07-16: qty 4

## 2021-07-16 NOTE — ED Provider Notes (Signed)
[facility] EMERGENCY DEPARTMENT Provider Note   CSN: [id number] Arrival date & time: 07/16/21  1836     History  Chief Complaint  Patient presents with   Headache    Lauren [name] is a 76 y.o. female.  Presenting to the emergency department with concern for headache.  Has had headache for about 6 weeks.  Comes and goes throughout the day without any significant pattern.  Will occur at all times a day.  Not necessarily worse in morning or evening.  No obvious triggers.  Has had headaches previously but this does seem to be the worst headache she is ever had.  Currently moderate.  She at times has pain in her eyes but has not had any blurry vision.  No pain on the sides of her head, primarily frontal, aching.  Has tried BC, Motrin without relief.  States that a couple weeks ago she went to her eye doctor and they did a formal eye exam and she was told her eyes were fine.  Has not had any difficulty with swallowing, speech, vision, numbness, weakness.  HPI     Home Medications Prior to Admission medications   Medication Sig Start Date End Date Taking? Authorizing Provider  amLODipine (NORVASC) 5 MG tablet TAKE 1 TABLET BY MOUTH  DAILY 05/20/21   Biagio Borg, MD  levothyroxine (SYNTHROID) 50 MCG tablet TAKE 1 TABLET BY MOUTH  DAILY 05/20/21   Biagio Borg, MD  ALPRAZolam Duanne Moron) 1 MG tablet TAKE 1/2 TO 1 TABLET BY MOUTH TWICE DAILY AS NEEDED 02/28/21   Biagio Borg, MD  aspirin EC 325 MG tablet Take 325 mg by mouth daily. Patient not taking: Reported on 04/26/2021    [provider]  escitalopram (LEXAPRO) 10 MG tablet TAKE 1 TABLET BY MOUTH  DAILY 05/18/21   Biagio Borg, MD  hydrochlorothiazide (MICROZIDE) 12.5 MG capsule TAKE 1 CAPSULE BY MOUTH  DAILY 02/25/21   Biagio Borg, MD  ibuprofen (ADVIL) 800 MG tablet TAKE 1 TABLET(800 MG) BY MOUTH TWICE DAILY AS NEEDED 07/14/21   Marybelle Killings, MD  RESTASIS 0.05 % ophthalmic emulsion 1 drop 2 (two) times  daily. Patient not taking: Reported on 04/26/2021 04/18/19   [provider]  TYRVAYA 0.03 MG/ACT SOLN Place into both nostrils. 04/08/21   [provider]  zolpidem (AMBIEN) 5 MG tablet TAKE 1 TABLET BY MOUTH AT  BEDTIME AS NEEDED FOR SLEEP 05/10/21   Biagio Borg, MD      Allergies    Celebrex [celecoxib]    Review of Systems   Review of Systems  Constitutional:  Negative for chills and fever.  HENT:  Negative for ear pain and sore throat.   Eyes:  Positive for pain. Negative for visual disturbance.  Respiratory:  Negative for cough and shortness of breath.   Cardiovascular:  Negative for chest pain and palpitations.  Gastrointestinal:  Negative for abdominal pain and vomiting.  Genitourinary:  Negative for dysuria and hematuria.  Musculoskeletal:  Negative for arthralgias, back pain and joint swelling.  Skin:  Negative for color change and rash.  Neurological:  Positive for headaches. Negative for seizures and syncope.  All other systems reviewed and are negative.  Physical Exam Updated Vital Signs BP (!) 145/65    Pulse 65    Temp 97.7 F (36.5 C) (Oral)    Resp 16    LMP 05/22/1996    SpO2 95%  Physical Exam Vitals and nursing  note reviewed.  Constitutional:      General: She is not in acute distress.    Appearance: She is well-developed.  HENT:     Head: Normocephalic and atraumatic.     Comments: No tenderness over the temporal regions bilaterally Eyes:     General: Lids are normal.        Right eye: No foreign body or discharge.        Left eye: No foreign body or discharge.     Intraocular pressure: Right eye pressure is 17 mmHg. Left eye pressure is 14 mmHg. Measurements were taken using a handheld tonometer.    Extraocular Movements: Extraocular movements intact.     Right eye: Normal extraocular motion and no nystagmus.     Left eye: Normal extraocular motion and no nystagmus.     Conjunctiva/sclera: Conjunctivae normal.  Cardiovascular:      Rate and Rhythm: Normal rate and regular rhythm.     Heart sounds: No murmur heard. Pulmonary:     Effort: Pulmonary effort is normal. No respiratory distress.     Breath sounds: Normal breath sounds.  Abdominal:     Palpations: Abdomen is soft.     Tenderness: There is no abdominal tenderness.  Musculoskeletal:        General: No swelling.     Cervical back: Neck supple.  Skin:    General: Skin is warm and dry.     Capillary Refill: Capillary refill takes less than 2 seconds.  Neurological:     Comments: AAOx3 CN 2-12 intact, speech clear visual fields intact 5/5 strength in b/l UE and LE Sensation to light touch intact in b/l UE and LE Normal FNF Normal gait  Psychiatric:        Mood and Affect: Mood normal.    ED Results / Procedures / Treatments   Labs (all labs ordered are listed, but only abnormal results are displayed) Labs Reviewed  COMPREHENSIVE METABOLIC PANEL - Abnormal; Notable for the following components:      Result Value   Glucose, Bld 126 (*)    All other components within normal limits  PROTIME-INR  APTT  CBC  DIFFERENTIAL    EKG None  Radiology CT HEAD WO CONTRAST  Result Date: 07/16/2021 CLINICAL DATA:  Headache EXAM: CT HEAD WITHOUT CONTRAST TECHNIQUE: Contiguous axial images were obtained from the base of the skull through the vertex without intravenous contrast. RADIATION DOSE REDUCTION: This exam was performed according to the departmental dose-optimization program which includes automated exposure control, adjustment of the mA and/or kV according to patient size and/or use of iterative reconstruction technique. COMPARISON:  08/30/2014 FINDINGS: Brain: No acute intracranial abnormality. Specifically, no hemorrhage, hydrocephalus, mass lesion, acute infarction, or significant intracranial injury. Vascular: No hyperdense vessel or unexpected calcification. Skull: No acute calvarial abnormality. Sinuses/Orbits: No acute findings Other: None  IMPRESSION: No acute intracranial abnormality. Electronically Signed   By: Rolm Baptise M.D.   On: 07/16/2021 19:38    Procedures Procedures    Medications Ordered in ED Medications  sodium chloride flush (NS) 0.9 % injection 3 mL (3 mLs Intravenous Not Given 07/16/21 2032)  tetracaine (PONTOCAINE) 0.5 % ophthalmic solution 1 drop (has no administration in time range)  diphenhydrAMINE (BENADRYL) injection 12.5 mg (12.5 mg Intravenous Given 07/16/21 2029)  prochlorperazine (COMPAZINE) injection 10 mg (10 mg Intravenous Given 07/16/21 2027)  ketorolac (TORADOL) 15 MG/ML injection 15 mg (15 mg Intravenous Given 07/16/21 2028)    ED Course/ Medical Decision Making/ A&P  Medical Decision Making Amount and/or Complexity of Data Reviewed Labs: ordered. Radiology: ordered.  Risk Prescription drug management.   76 year old lady presenting to the emergency department with concern for bad headache over the past 6 weeks.  On exam she is well-appearing in no distress and has a normal neurologic exam.  CT head negative for acute process.  Intraocular pressure normal in both eyes, therefore not consistent with glaucoma.  No tenderness to palpation over temporal regions, doubt GCA.  Based on symptomatology, suspect most likely tension type headache.  Provided symptomatic control, patient had complete resolution of symptoms.  Has appointment with primary doctor on Monday.  Advised following up with them.  Updated husband at bedside throughout stay.         Final Clinical Impression(s) / ED Diagnoses Final diagnoses:  Tension headache    Rx / DC Orders ED Discharge Orders     None         Lucrezia Starch, MD 07/16/21 2111

## 2021-07-16 NOTE — Discharge Instructions (Signed)
With your primary care office as previously scheduled on Monday.  If you have uncontrolled headache, vomiting, fever, numbness, weakness, speech change or vision change, come back to ER for reassessment.

## 2021-07-16 NOTE — ED Notes (Addendum)
Tetracaine drops and eye box at pt bedside for provider use

## 2021-07-16 NOTE — ED Triage Notes (Signed)
Pt reports worst headache of her life x 6 weeks.  States she has not been seen for the headache.  Taking BC, ASA, and Ibuprofen without any relief.  "Pain over 10".  Also reports elevated BP and light sensitivity.  Denies numbness/weakness.  No arm drift.

## 2021-07-16 NOTE — ED Notes (Signed)
E-signature pad unavailable at time of pt discharge. This RN discussed discharge materials with pt and answered all pt questions. Pt stated understanding of discharge material. ? ?

## 2021-07-19 ENCOUNTER — Other Ambulatory Visit: Payer: Self-pay

## 2021-07-19 ENCOUNTER — Ambulatory Visit (INDEPENDENT_AMBULATORY_CARE_PROVIDER_SITE_OTHER): Payer: Medicare Other | Admitting: Internal Medicine

## 2021-07-19 ENCOUNTER — Encounter: Payer: Self-pay | Admitting: Internal Medicine

## 2021-07-19 DIAGNOSIS — I10 Essential (primary) hypertension: Secondary | ICD-10-CM | POA: Diagnosis not present

## 2021-07-19 DIAGNOSIS — R739 Hyperglycemia, unspecified: Secondary | ICD-10-CM

## 2021-07-19 DIAGNOSIS — R519 Headache, unspecified: Secondary | ICD-10-CM | POA: Diagnosis not present

## 2021-07-19 DIAGNOSIS — F5101 Primary insomnia: Secondary | ICD-10-CM | POA: Diagnosis not present

## 2021-07-19 MED ORDER — BUTALBITAL-APAP-CAFFEINE 50-325-40 MG PO TABS: 1.0000 | ORAL_TABLET | Freq: Four times a day (QID) | ORAL | 0 refills | Status: DC | PRN

## 2021-07-19 NOTE — Assessment & Plan Note (Signed)
Lab Results  Component Value Date   HGBA1C 5.9 04/26/2021   Stable, pt to continue current medical treatment  - diet

## 2021-07-19 NOTE — Assessment & Plan Note (Signed)
Mild to mod, encouraged ok to take the ambien qhs prn,  to f/u any worsening symptoms or concerns

## 2021-07-19 NOTE — Progress Notes (Signed)
Patient ID: Lauren [name], female   DOB: [birth year], 76 y.o.   MRN: [id number]        Chief Complaint: follow up ED visit with headache       HPI:  Lauren [name] is a 76 y.o. female here with c/o 6 wks onset moderate recurring bifrontal mostly right frontal HA, dull, without radiation, vision change, n/v, photophobia and no other focal neuro s/s. No other overt sinus /uri symptoms.  Denies more stressor influence over baseline.  BP severe elevated at ED, HA temporariily improved at ED now recurred today.  BP improved to baseline, and < 140/90 at home.  Has recent worsening insomnia, hesitates to restart [place] but willing after discussion today.   Pt denies polydipsia, polyuria.   Pt denies fever, wt loss, night sweats, loss of appetite, or other constitutional symptoms   CT head neg for acute in ED       Wt Readings from Last 3 Encounters:  07/19/21 148 lb (67.1 kg)  04/26/21 148 lb (67.1 kg)  08/18/20 155 lb (70.3 kg)   BP Readings from Last 3 Encounters:  07/19/21 118/62  07/16/21 (!) 150/74  04/26/21 114/60         Past Medical History:  Diagnosis Date   Anxiety    Arthritis    Carotid stenosis 10/08/2013   Cervical disc disease    Depression    Diverticulitis    Diverticulosis    Frequent headaches    Hypertension    does not now per pt.   Hypothyroidism 10/26/2015   Impaired glucose tolerance 10/08/2013   normal HgA1C   Neck pain    Psoriasis    Past Surgical History:  Procedure Laterality Date   BREAST BIOPSY Left 2011   benign    CERVICAL FUSION     approx 1980   CHOLECYSTECTOMY     COLONOSCOPY     POSTERIOR CERVICAL FUSION/FORAMINOTOMY N/A 11/05/2014   Procedure: Cervical four to Cervical seven  Posterior Cervical Fusion with lateral mass fixation;  Surgeon: Eustace Moore, MD;  Location: [facility];  Service: Neurosurgery;  Laterality: N/A;   C4 - C7 Posterior Cervical Fusion with lateral mass fixation   TOTAL ABDOMINAL HYSTERECTOMY  1998   Fibroid   UPPER  GASTROINTESTINAL ENDOSCOPY      reports that she has never smoked. She has never used smokeless tobacco. She reports current alcohol use. She reports that she does not use drugs. family history includes Alcohol abuse in an other family member; Alcoholism in her father; Arthritis in an other family member; Cancer in an other family member; Cervical cancer in her mother; Colon cancer in her father; Diabetes in her brother and mother; Heart attack in her mother; Heart disease in her mother and another family member; Hypertension in an other family member; Lung cancer in her father; Throat cancer in her brother. Allergies  Allergen Reactions   Celebrex [Celecoxib] Itching and Rash   Current Outpatient Medications on File Prior to Visit  Medication Sig Dispense Refill   ALPRAZolam (XANAX) 1 MG tablet TAKE 1/2 TO 1 TABLET BY MOUTH TWICE DAILY AS NEEDED 60 tablet 0   amLODipine (NORVASC) 5 MG tablet TAKE 1 TABLET BY MOUTH  DAILY (Patient not taking: Reported on 07/19/2021) 90 tablet 3   aspirin EC 325 MG tablet Take 325 mg by mouth daily.     Cholecalciferol (D3 ADULT PO) Take by mouth.     cyanocobalamin 100 MCG tablet Take 100  mcg by mouth daily.     hydrochlorothiazide (MICROZIDE) 12.5 MG capsule TAKE 1 CAPSULE BY MOUTH  DAILY 90 capsule 3   levothyroxine (SYNTHROID) 50 MCG tablet TAKE 1 TABLET BY MOUTH  DAILY 90 tablet 3   RESTASIS 0.05 % ophthalmic emulsion 1 drop 2 (two) times daily.     zolpidem (AMBIEN) 5 MG tablet TAKE 1 TABLET BY MOUTH AT  BEDTIME AS NEEDED FOR SLEEP 90 tablet 1   escitalopram (LEXAPRO) 10 MG tablet TAKE 1 TABLET BY MOUTH  DAILY (Patient not taking: Reported on 07/19/2021) 90 tablet 3   ibuprofen (ADVIL) 800 MG tablet TAKE 1 TABLET(800 MG) BY MOUTH TWICE DAILY AS NEEDED (Patient not taking: Reported on 07/19/2021) 60 tablet 1   TYRVAYA 0.03 MG/ACT SOLN Place into both nostrils. (Patient not taking: Reported on 07/19/2021)     No current facility-administered medications on file  prior to visit.        ROS:  All others reviewed and negative.  Objective        PE:  BP 118/62 (BP Location: Right Arm, Patient Position: Sitting, Cuff Size: Large)    Pulse 71    Temp 98.5 F (36.9 C) (Oral)    Ht 5\' 4"  (1.626 m)    Wt 148 lb (67.1 kg)    LMP 05/22/1996    SpO2 97%    BMI 25.40 kg/m                 Constitutional: Pt appears in NAD               HENT: Head: NCAT.                Right Ear: External ear normal.                 Left Ear: External ear normal.                Eyes: . Pupils are equal, round, and reactive to light. Conjunctivae and EOM are normal               Nose: without d/c or deformity               Neck: Neck supple. Gross normal ROM               Cardiovascular: Normal rate and regular rhythm.                 Pulmonary/Chest: Effort normal and breath sounds without rales or wheezing.                               Neurological: Pt is alert. At baseline orientation, motor grossly intact               Skin: Skin is warm. No rashes, no other new lesions, LE edema - none               Psychiatric: Pt behavior is normal without agitation   Micro: none  Cardiac tracings I have personally interpreted today:  none  Pertinent Radiological findings (summarize): none   Lab Results  Component Value Date   WBC 6.7 07/16/2021   HGB 12.9 07/16/2021   HCT 39.7 07/16/2021   PLT 209 07/16/2021   GLUCOSE 126 (H) 07/16/2021   CHOL 155 04/26/2021   TRIG 240.0 (H) 04/26/2021   HDL 29.70 (L) 04/26/2021   LDLDIRECT 75.0 04/26/2021  LDLCALC 90 10/26/2017   ALT 16 07/16/2021   AST 19 07/16/2021   NA 139 07/16/2021   K 3.7 07/16/2021   CL 104 07/16/2021   CREATININE 0.82 07/16/2021   BUN 15 07/16/2021   CO2 26 07/16/2021   TSH 3.81 04/26/2021   INR 0.9 07/16/2021   HGBA1C 5.9 04/26/2021   Assessment/Plan:  Lauren [name] is a 76 y.o. White or Caucasian [1] female with  has a past medical history of Anxiety, Arthritis, Carotid stenosis (10/08/2013),  Cervical disc disease, Depression, Diverticulitis, Diverticulosis, Frequent headaches, Hypertension, Hypothyroidism (10/26/2015), Impaired glucose tolerance (10/08/2013), Neck pain, and Psoriasis.  Headache  mod, etiology unclear, for fioricet prn,  Head MRI, consider neuro referral, to f/u any worsening symptoms or concerns  Essential hypertension BP Readings from Last 3 Encounters:  07/19/21 118/62  07/16/21 (!) 150/74  04/26/21 114/60   Stable, pt to continue medical treatment norvasc, hct   Hyperglycemia Lab Results  Component Value Date   HGBA1C 5.9 04/26/2021   Stable, pt to continue current medical treatment  - diet   Insomnia Mild to mod, encouraged ok to take the ambien qhs prn,  to f/u any worsening symptoms or concerns  Followup: Return if symptoms worsen or fail to improve.  Cathlean Cower, MD 07/19/2021 9:23 PM [facility]

## 2021-07-19 NOTE — Assessment & Plan Note (Signed)
mod, etiology unclear, for fioricet prn,  Head MRI, consider neuro referral, to f/u any worsening symptoms or concerns

## 2021-07-19 NOTE — Patient Instructions (Addendum)
Please take all new medication as prescribed - the fioricet as needed for pain  You will be contacted regarding the referral for: MRI brain  Please continue all other medications as before, and refills have been done if requested.  Please have the pharmacy call with any other refills you may need.  Please keep your appointments with your specialists as you may have planned

## 2021-07-19 NOTE — Assessment & Plan Note (Signed)
BP Readings from Last 3 Encounters:  07/19/21 118/62  07/16/21 (!) 150/74  04/26/21 114/60   Stable, pt to continue medical treatment norvasc, hct

## 2021-07-21 ENCOUNTER — Other Ambulatory Visit: Payer: Medicare Other

## 2021-07-25 ENCOUNTER — Encounter (HOSPITAL_COMMUNITY): Payer: Self-pay

## 2021-07-25 ENCOUNTER — Ambulatory Visit (INDEPENDENT_AMBULATORY_CARE_PROVIDER_SITE_OTHER): Payer: Medicare Other

## 2021-07-25 ENCOUNTER — Other Ambulatory Visit: Payer: Self-pay

## 2021-07-25 ENCOUNTER — Ambulatory Visit (HOSPITAL_COMMUNITY)
Admission: EM | Admit: 2021-07-25 | Discharge: 2021-07-25 | Disposition: A | Discharge status: Home / Self Care | Payer: Medicare Other | Attending: Family Medicine | Admitting: Family Medicine

## 2021-07-25 ENCOUNTER — Other Ambulatory Visit: Payer: Medicare Other

## 2021-07-25 DIAGNOSIS — S2241XA Multiple fractures of ribs, right side, initial encounter for closed fracture: Secondary | ICD-10-CM | POA: Diagnosis not present

## 2021-07-25 DIAGNOSIS — S2231XA Fracture of one rib, right side, initial encounter for closed fracture: Secondary | ICD-10-CM | POA: Diagnosis not present

## 2021-07-25 DIAGNOSIS — Z9889 Other specified postprocedural states: Secondary | ICD-10-CM | POA: Diagnosis not present

## 2021-07-25 MED ORDER — KETOROLAC TROMETHAMINE 30 MG/ML IJ SOLN
INTRAMUSCULAR | Status: AC
  Filled 2021-07-25: qty 1

## 2021-07-25 MED ORDER — ACETAMINOPHEN-CODEINE #3 300-30 MG PO TABS: 1.0000 | ORAL_TABLET | Freq: Four times a day (QID) | ORAL | 0 refills | Status: DC | PRN

## 2021-07-25 MED ORDER — KETOROLAC TROMETHAMINE 30 MG/ML IJ SOLN
30.0000 mg | Freq: Once | INTRAMUSCULAR | Status: AC
  Administered 2021-07-25: 30 mg via INTRAMUSCULAR

## 2021-07-25 NOTE — ED Provider Notes (Signed)
[name]    CSN: [id number] Arrival date & time: 07/25/21  1406      History   Chief Complaint Chief Complaint  Patient presents with   Fall    HPI Lauren [name] is a 76 y.o. female.    Fall  Here with a history of right rib pain since she fell on March 1 when getting out of the bed.  She fell onto her nightstand and a metal lamp.  No fever or chills or cough.  It does hurt worse to move or to breathe deeply.  Chart shows a history of Celebrex allergy.  She has been able to take ibuprofen without problem in the past  Past Medical History:  Diagnosis Date   Anxiety    Arthritis    Carotid stenosis 10/08/2013   Cervical disc disease    Depression    Diverticulitis    Diverticulosis    Frequent headaches    Hypertension    does not now per pt.   Hypothyroidism 10/26/2015   Impaired glucose tolerance 10/08/2013   normal HgA1C   Neck pain    Psoriasis     Patient Active Problem List   Diagnosis Date Noted   Headache 07/19/2021   HLD (hyperlipidemia) 05/01/2021   Unilateral primary osteoarthritis, left knee 06/23/2020   Chondromalacia patellae, left knee 06/01/2020   Synovitis of left knee 01/14/2020   Hyperglycemia 12/23/2018   Hematuria 12/23/2018   Vitamin D deficiency 10/25/2017   Rash 05/24/2017   Obesity 05/24/2017   Hypothyroidism 10/26/2015   Peripheral edema 10/22/2015   Cervical dystonia 07/14/2015   Weakness generalized 11/16/2014   Insomnia 11/16/2014   S/P cervical spinal fusion 11/05/2014   Preop exam for internal medicine 10/14/2014   Essential hypertension 10/14/2014   Fracture, cervical vertebra ([place]) 08/30/2014   Encounter for well adult exam with abnormal findings 10/08/2013   Carotid stenosis 10/08/2013   Depression    Diverticulitis    Anxiety    Psoriasis    Cervical disc disease    Diverticulitis of colon (without mention of hemorrhage)(562.11) 05/10/2013    Past Surgical History:  Procedure Laterality Date    BREAST BIOPSY Left 2011   benign    CERVICAL FUSION     approx 1980   CHOLECYSTECTOMY     COLONOSCOPY     POSTERIOR CERVICAL FUSION/FORAMINOTOMY N/A 11/05/2014   Procedure: Cervical four to Cervical seven  Posterior Cervical Fusion with lateral mass fixation;  Surgeon: Eustace Moore, MD;  Location: [facility];  Service: Neurosurgery;  Laterality: N/A;   C4 - C7 Posterior Cervical Fusion with lateral mass fixation   TOTAL ABDOMINAL HYSTERECTOMY  1998   Fibroid   UPPER GASTROINTESTINAL ENDOSCOPY      OB History     Gravida  2   Para  2   Term  2   Preterm  0   AB  0   Living  2      SAB  0   IAB  0   Ectopic  0   Multiple  0   Live Births  2            Home Medications    Prior to Admission medications   Medication Sig Start Date End Date Taking? Authorizing Provider  acetaminophen-codeine (TYLENOL #3) 300-30 MG tablet Take 1-2 tablets by mouth every 6 (six) hours as needed for moderate pain. 07/25/21  Yes Barrett Henle, MD  amLODipine (NORVASC) 5 MG  tablet TAKE 1 TABLET BY MOUTH  DAILY Patient not taking: Reported on 07/19/2021 05/20/21   Biagio Borg, MD  ALPRAZolam Duanne Moron) 1 MG tablet TAKE 1/2 TO 1 TABLET BY MOUTH TWICE DAILY AS NEEDED 02/28/21   Biagio Borg, MD  aspirin EC 325 MG tablet Take 325 mg by mouth daily.    [provider]  butalbital-acetaminophen-caffeine (FIORICET) [phone] MG tablet Take 1 tablet by mouth every 6 (six) hours as needed for headache. 07/19/21   Biagio Borg, MD  Cholecalciferol (D3 ADULT PO) Take by mouth.    [provider]  cyanocobalamin 100 MCG tablet Take 100 mcg by mouth daily.    [provider]  escitalopram (LEXAPRO) 10 MG tablet TAKE 1 TABLET BY MOUTH  DAILY Patient not taking: Reported on 07/19/2021 05/18/21   Biagio Borg, MD  hydrochlorothiazide (MICROZIDE) 12.5 MG capsule TAKE 1 CAPSULE BY MOUTH  DAILY 02/25/21   Biagio Borg, MD  levothyroxine (SYNTHROID) 50 MCG tablet TAKE 1  TABLET BY MOUTH  DAILY 05/20/21   Biagio Borg, MD  RESTASIS 0.05 % ophthalmic emulsion 1 drop 2 (two) times daily. 04/18/19   [provider]  TYRVAYA 0.03 MG/ACT SOLN Place into both nostrils. Patient not taking: Reported on 07/19/2021 04/08/21   [provider]  zolpidem (AMBIEN) 5 MG tablet TAKE 1 TABLET BY MOUTH AT  BEDTIME AS NEEDED FOR SLEEP 05/10/21   Biagio Borg, MD    Family History Family History  Problem Relation Age of Onset   Heart disease Mother    Diabetes Mother    Cervical cancer Mother    Heart attack Mother    Lung cancer Father    Alcoholism Father    Colon cancer Father    Diabetes Brother    Throat cancer Brother    Cancer Other        lung cancer   Heart disease Other    Hypertension Other    Arthritis Other    Alcohol abuse Other    Rectal cancer Neg Hx    Stomach cancer Neg Hx     Social History Social History   Tobacco Use   Smoking status: Never   Smokeless tobacco: Never  Vaping Use   Vaping Use: Never used  Substance Use Topics   Alcohol use: Yes    Alcohol/week: 0.0 standard drinks    Comment: 1 a month   Drug use: No     Allergies   Celebrex [celecoxib]   Review of Systems Review of Systems   Physical Exam Triage Vital Signs ED Triage Vitals  Enc Vitals Group     BP 07/25/21 1448 (!) 141/74     Pulse Rate 07/25/21 1448 90     Resp 07/25/21 1448 18     Temp 07/25/21 1448 98.6 F (37 C)     Temp Source 07/25/21 1448 Oral     SpO2 07/25/21 1448 96 %     Weight --      Height --      Head Circumference --      Peak Flow --      Pain Score 07/25/21 1449 10     Pain Loc --      Pain Edu? --      Excl. in [place]? --    No data found.  Updated Vital Signs BP (!) 141/74 (BP Location: Left Arm)    Pulse 90    Temp 98.6 F (37  C) (Oral)    Resp 18    LMP 05/22/1996    SpO2 96%   Visual Acuity Right Eye Distance:   Left Eye Distance:   Bilateral Distance:    Right Eye Near:   Left Eye Near:     Bilateral Near:     Physical Exam Vitals reviewed.  Constitutional:      General: She is not in acute distress.    Appearance: She is not ill-appearing, toxic-appearing or diaphoretic.  Cardiovascular:     Rate and Rhythm: Normal rate and regular rhythm.     Heart sounds: No murmur heard. Pulmonary:     Effort: Pulmonary effort is normal.     Breath sounds: Normal breath sounds.     Comments: She has ecchymosis around the right anterior and lateral lower rib cage.  She is tender there and up into her right axilla and under her right breast. Chest:     Chest wall: Tenderness present.  Neurological:     General: No focal deficit present.     Mental Status: She is alert and oriented to person, place, and time.  Psychiatric:        Behavior: Behavior normal.     UC Treatments / Results  Labs (all labs ordered are listed, but only abnormal results are displayed) Labs Reviewed - No data to display  EKG   Radiology DG Ribs Unilateral W/Chest Right  Result Date: 07/25/2021 CLINICAL DATA:  Recent fall, pain EXAM: RIGHT RIBS AND CHEST - 3+ VIEW COMPARISON:  Chest radiograph done on 03/10/2019 FINDINGS: Cardiac size is within normal limits. There are no signs of pulmonary edema or focal pulmonary consolidation. Left cardiophrenic angle is indistinct. There is no significant pleural effusion or pneumothorax. Postsurgical changes are noted in the cervical spine. There is cortical irregularity in the anterior end of right seventh rib seen only in the oblique projection. IMPRESSION: There is undisplaced fracture in the anterior end of right seventh rib. There is no focal pulmonary contusion. There is no pleural effusion or pneumothorax. Electronically Signed   By: Elmer Picker M.D.   On: 07/25/2021 16:08    Procedures Procedures (including critical care time)  Medications Ordered in UC Medications  ketorolac (TORADOL) 30 MG/ML injection 30 mg (30 mg Intramuscular Given 07/25/21 1517)     Initial Impression / Assessment and Plan / UC Course  I have reviewed the triage vital signs and the nursing notes.  Pertinent labs & imaging results that were available during my care of the patient were reviewed by me and considered in my medical decision making (see chart for details).     X-ray shows a fractured seventh rib, nondisplaced.  She has some tramadol at home that actually does not do much for when she is taking it for other issues in the past.  She also does not want to take hydrocodone.  So Tylenol 3 sent for pain relief. Final Clinical Impressions(s) / UC Diagnoses   Final diagnoses:  Closed fracture of one rib of right side, initial encounter     Discharge Instructions      Your x-ray showed a fracture of the seventh rib  Tylenol with codeine #3--1 every 4 hours as needed for pain.  It is best to take this with something on your stomach.  Call your primary care office and make an appointment for about 1 week from now to follow-up     ED Prescriptions     Medication Sig Dispense Auth.  Provider   acetaminophen-codeine (TYLENOL #3) 300-30 MG tablet Take 1-2 tablets by mouth every 6 (six) hours as needed for moderate pain. 15 tablet Jennie Hannay, Gwenlyn Perking, MD      I have reviewed the PDMP during this encounter.   Barrett Henle, MD 07/25/21 [phone]

## 2021-07-25 NOTE — Discharge Instructions (Addendum)
Your x-ray showed a fracture of the seventh rib ? ?Tylenol with codeine #3--1 every 4 hours as needed for pain.  It is best to take this with something on your stomach. ? ?Call your primary care office and make an appointment for about 1 week from now to follow-up ?

## 2021-07-25 NOTE — ED Triage Notes (Signed)
Pt states fell last Wednesday night getting out of bed and lost her balance and fell on her night stand. C/o pain with bruising to rt side/rib area. Pt c/o pain to rt breast also. States taking BC, ASA, and Ibuprofen '800mg'$  with no relief. Denies LOC. ?

## 2021-07-31 ENCOUNTER — Ambulatory Visit
Admission: RE | Admit: 2021-07-31 | Discharge: 2021-07-31 | Disposition: A | Discharge status: Home / Self Care | Payer: Medicare Other | Source: Ambulatory Visit | Attending: Internal Medicine | Admitting: Internal Medicine

## 2021-07-31 DIAGNOSIS — R519 Headache, unspecified: Secondary | ICD-10-CM

## 2021-08-01 ENCOUNTER — Telehealth: Payer: Self-pay

## 2021-08-01 DIAGNOSIS — R519 Headache, unspecified: Secondary | ICD-10-CM

## 2021-08-01 NOTE — Telephone Encounter (Signed)
I can refer to neurology if she likes,  thanks ?

## 2021-08-01 NOTE — Telephone Encounter (Signed)
Pt called in to get results of MRI. I advised the pt of Dr. Jenny Reichmann result note The test results show that your current treatment is OK, as the MRI is negative for any significant new problem ?  ?There is no other need for change of treatment or further evaluation based on these results, at this time. Pt is wanting to know since the MRI results are NEG, she is wondering the recommendation for her headaches and elevated BP.   ? ?Pt states that butalbital-acetaminophen-caffeine (FIORICET) 50-325-40 MG tablet is not helping at all for her headaches. ? ?Pt is requesting a CB at [phone] ? ? ? ?

## 2021-08-02 NOTE — Telephone Encounter (Signed)
TY

## 2021-08-02 NOTE — Telephone Encounter (Signed)
Ok this is done 

## 2021-08-02 NOTE — Telephone Encounter (Signed)
Pt called this morning for update.  ? ?Pt states that she would like the referral to neurology. ? ?FYI ?

## 2021-08-05 ENCOUNTER — Ambulatory Visit
Admission: RE | Admit: 2021-08-05 | Discharge: 2021-08-05 | Disposition: A | Discharge status: Home / Self Care | Payer: Medicare Other | Source: Ambulatory Visit | Attending: Internal Medicine | Admitting: Internal Medicine

## 2021-08-05 ENCOUNTER — Ambulatory Visit: Payer: Medicare Other

## 2021-08-05 DIAGNOSIS — R922 Inconclusive mammogram: Secondary | ICD-10-CM | POA: Diagnosis not present

## 2021-08-05 DIAGNOSIS — R928 Other abnormal and inconclusive findings on diagnostic imaging of breast: Secondary | ICD-10-CM

## 2021-08-15 ENCOUNTER — Encounter: Payer: Self-pay | Admitting: Neurology

## 2021-08-24 ENCOUNTER — Ambulatory Visit: Payer: Medicare Other | Admitting: Internal Medicine

## 2021-08-30 ENCOUNTER — Ambulatory Visit (INDEPENDENT_AMBULATORY_CARE_PROVIDER_SITE_OTHER): Payer: Medicare Other | Admitting: Internal Medicine

## 2021-08-30 ENCOUNTER — Encounter: Payer: Self-pay | Admitting: Internal Medicine

## 2021-08-30 VITALS — BP 124/66 | HR 72 | Temp 98.4°F | Ht 64.0 in | Wt 150.4 lb

## 2021-08-30 DIAGNOSIS — R739 Hyperglycemia, unspecified: Secondary | ICD-10-CM

## 2021-08-30 DIAGNOSIS — I6523 Occlusion and stenosis of bilateral carotid arteries: Secondary | ICD-10-CM | POA: Diagnosis not present

## 2021-08-30 DIAGNOSIS — R296 Repeated falls: Secondary | ICD-10-CM

## 2021-08-30 DIAGNOSIS — F419 Anxiety disorder, unspecified: Secondary | ICD-10-CM

## 2021-08-30 DIAGNOSIS — I10 Essential (primary) hypertension: Secondary | ICD-10-CM | POA: Diagnosis not present

## 2021-08-30 MED ORDER — ALPRAZOLAM 1 MG PO TABS: ORAL_TABLET | ORAL | 0 refills | Status: DC

## 2021-08-30 NOTE — Patient Instructions (Signed)
Please continue all other medications as before, and refills have been done if requested. ? ?Please have the pharmacy call with any other refills you may need. ? ?Please continue your efforts at being more active, low cholesterol diet, and weight control. ? ?Please keep your appointments with your specialists as you may have planned - neurology ? ?You will be contacted regarding the referral for: cartotid artery testing, and cardiac event monitor testing ? ? ? ? ?

## 2021-08-30 NOTE — Progress Notes (Signed)
Patient ID: Lauren [name], female   DOB: 11/25/1945, 76 y.o.   MRN: [id number] ? ? ? ?    Chief Complaint: follow up recent fall x 2 on mar 6 and apr 4 ? ?     HPI:  Lauren [name] is a 76 y.o. female here with c/o recurreg HA, falling with the first with right rib fx, pain now better and Pt denies chest pain, increased sob or doe, wheezing, orthopnea, PND, increased LE swelling, palpitations, dizziness or syncope.  The first episode occurred at nigh with onset right calf cramp while lying, tried to stand, lost balance and fell to right and struck metal lamp.  The second fall occurred in kitchen , standing, just fussing with something taken down off the wall, had no prodrome but justs went down though no LOC and wsa aware she was happening  Did not hit head, but struck left upper arm and left knee with some bruising now better.  Witnessed by husband, no siezure  Seen at [facility] with first fall, not wih second.  Initial Head CT neg, f/u MRI brain mar 13 neg.  Fioricet no help with recent headaches.  Has appt with Dr Tomi Likens neuro may 72.  Last echo with normal EF and gr 1 diast dysfxn in 2015.  Last Carotid duplex 2015 with < 39% bilateral stenosis.  ?      ?Wt Readings from Last 3 Encounters:  ?08/30/21 150 lb 6.4 oz (68.2 kg)  ?07/19/21 148 lb (67.1 kg)  ?04/26/21 148 lb (67.1 kg)  ? ?BP Readings from Last 3 Encounters:  ?08/30/21 124/66  ?07/25/21 (!) 141/74  ?07/19/21 118/62  ? ?      ?Past Medical History:  ?Diagnosis Date  ? Anxiety   ? Arthritis   ? Carotid stenosis 10/08/2013  ? Cervical disc disease   ? Depression   ? Diverticulitis   ? Diverticulosis   ? Frequent headaches   ? Hypertension   ? does not now per pt.  ? Hypothyroidism 10/26/2015  ? Impaired glucose tolerance 10/08/2013  ? normal HgA1C  ? Neck pain   ? Psoriasis   ? ?Past Surgical History:  ?Procedure Laterality Date  ? BREAST BIOPSY Left 2011  ? benign   ? CERVICAL FUSION    ? approx 1980  ? CHOLECYSTECTOMY    ? COLONOSCOPY    ? POSTERIOR CERVICAL  FUSION/FORAMINOTOMY N/A 11/05/2014  ? Procedure: Cervical four to Cervical seven  Posterior Cervical Fusion with lateral mass fixation;  Surgeon: Eustace Moore, MD;  Location: [facility];  Service: Neurosurgery;  Laterality: N/A;   C4 - C7 Posterior Cervical Fusion with lateral mass fixation  ? TOTAL ABDOMINAL HYSTERECTOMY  1998  ? Fibroid  ? UPPER GASTROINTESTINAL ENDOSCOPY    ? ? reports that she has never smoked. She has never used smokeless tobacco. She reports current alcohol use. She reports that she does not use drugs. ?family history includes Alcohol abuse in an other family member; Alcoholism in her father; Arthritis in an other family member; Cancer in an other family member; Cervical cancer in her mother; Colon cancer in her father; Diabetes in her brother and mother; Heart attack in her mother; Heart disease in her mother and another family member; Hypertension in an other family member; Lung cancer in her father; Throat cancer in her brother. ?Allergies  ?Allergen Reactions  ? Celebrex [Celecoxib] Itching and Rash  ? ?Current Outpatient Medications on File Prior to Visit  ?Medication  Sig Dispense Refill  ? acetaminophen-codeine (TYLENOL #3) 300-30 MG tablet Take 1-2 tablets by mouth every 6 (six) hours as needed for moderate pain. 15 tablet 0  ? amLODipine (NORVASC) 5 MG tablet TAKE 1 TABLET BY MOUTH  DAILY 90 tablet 3  ? aspirin EC 325 MG tablet Take 325 mg by mouth daily.    ? butalbital-acetaminophen-caffeine (FIORICET) 50-325-40 MG tablet Take 1 tablet by mouth every 6 (six) hours as needed for headache. 30 tablet 0  ? Cholecalciferol (D3 ADULT PO) Take by mouth.    ? cyanocobalamin 100 MCG tablet Take 100 mcg by mouth daily.    ? hydrochlorothiazide (MICROZIDE) 12.5 MG capsule TAKE 1 CAPSULE BY MOUTH  DAILY 90 capsule 3  ? levothyroxine (SYNTHROID) 50 MCG tablet TAKE 1 TABLET BY MOUTH  DAILY 90 tablet 3  ? RESTASIS 0.05 % ophthalmic emulsion 1 drop 2 (two) times daily.    ? zolpidem (AMBIEN) 5 MG  tablet TAKE 1 TABLET BY MOUTH AT  BEDTIME AS NEEDED FOR SLEEP 90 tablet 1  ? escitalopram (LEXAPRO) 10 MG tablet TAKE 1 TABLET BY MOUTH  DAILY (Patient not taking: Reported on 07/19/2021) 90 tablet 3  ? TYRVAYA 0.03 MG/ACT SOLN Place into both nostrils. (Patient not taking: Reported on 08/30/2021)    ? ?No current facility-administered medications on file prior to visit.  ? ?     ROS:  All others reviewed and negative. ? ?Objective  ? ?     PE:  BP 124/66 (BP Location: Right Arm, Patient Position: Sitting, Cuff Size: Normal)   Pulse 72   Temp 98.4 ?F (36.9 ?C) (Oral)   Ht '5\' 4"'$  (1.626 m)   Wt 150 lb 6.4 oz (68.2 kg)   LMP 05/22/1996   SpO2 97%   BMI 25.82 kg/m?  ? ?              Constitutional: Pt appears in NAD ?              HENT: Head: NCAT.  ?              Right Ear: External ear normal.   ?              Left Ear: External ear normal.  ?              Eyes: . Pupils are equal, round, and reactive to light. Conjunctivae and EOM are normal ?              Nose: without d/c or deformity ?              Neck: Neck supple. Gross normal ROM ?              Cardiovascular: Normal rate and regular rhythm.   ?              Pulmonary/Chest: Effort normal and breath sounds without rales or wheezing.  ?              Abd:  Soft, NT, ND, + BS, no organomegaly ?              Neurological: Pt is alert. At baseline orientation, motor grossly intact ?              Skin: Skin is warm. No rashes, no other new lesions, LE edema - none ?              Psychiatric: Pt behavior is normal without  agitation  ? ?Micro: none ? ?Cardiac tracings I have personally interpreted today:  none ? ?Pertinent Radiological findings (summarize): none  ? ?Lab Results  ?Component Value Date  ? WBC 6.7 07/16/2021  ? HGB 12.9 07/16/2021  ? HCT 39.7 07/16/2021  ? PLT 209 07/16/2021  ? GLUCOSE 126 (H) 07/16/2021  ? CHOL 155 04/26/2021  ? TRIG 240.0 (H) 04/26/2021  ? HDL 29.70 (L) 04/26/2021  ? LDLDIRECT 75.0 04/26/2021  ? [place] 90 10/26/2017  ? ALT 16  07/16/2021  ? AST 19 07/16/2021  ? NA 139 07/16/2021  ? K 3.7 07/16/2021  ? CL 104 07/16/2021  ? CREATININE 0.82 07/16/2021  ? BUN 15 07/16/2021  ? CO2 26 07/16/2021  ? TSH 3.81 04/26/2021  ? INR 0.9 07/16/2021  ? HGBA1C 5.9 04/26/2021  ? ?Assessment/Plan:  ?Lauren [name] is a 77 y.o. White or Caucasian [1] female with  has a past medical history of Anxiety, Arthritis, Carotid stenosis (10/08/2013), Cervical disc disease, Depression, Diverticulitis, Diverticulosis, Frequent headaches, Hypertension, Hypothyroidism (10/26/2015), Impaired glucose tolerance (10/08/2013), Neck pain, and Psoriasis. ? ?Carotid stenosis ?For f/u carotid studies,  to f/u any worsening symptoms or concerns ? ?Recurrent falls ?? Drop attack vs other - for f/u neurology as planned, also card event monitor r/o arrythmia ? ?Anxiety ?Overall stable to mild only, for xanax refill prn ? ?Essential hypertension ?BP Readings from Last 3 Encounters:  ?08/30/21 124/66  ?07/25/21 (!) 141/74  ?07/19/21 118/62  ? ?Stable, pt to continue medical treatment norvasc, hct ? ? ?Hyperglycemia ?Lab Results  ?Component Value Date  ? HGBA1C 5.9 04/26/2021  ? ?Stable, pt to continue current medical treatment   - diet ? ?Followup: Return if symptoms worsen or fail to improve. ? ?Cathlean Cower, MD 09/04/2021 10:08 PM ?[facility] ?[place] ?Internal Medicine ?

## 2021-08-31 DIAGNOSIS — H2511 Age-related nuclear cataract, right eye: Secondary | ICD-10-CM | POA: Diagnosis not present

## 2021-09-04 ENCOUNTER — Encounter: Payer: Self-pay | Admitting: Internal Medicine

## 2021-09-04 DIAGNOSIS — R296 Repeated falls: Secondary | ICD-10-CM | POA: Insufficient documentation

## 2021-09-04 NOTE — Assessment & Plan Note (Signed)
BP Readings from Last 3 Encounters:  ?08/30/21 124/66  ?07/25/21 (!) 141/74  ?07/19/21 118/62  ? ?Stable, pt to continue medical treatment norvasc, hct ? ?

## 2021-09-04 NOTE — Assessment & Plan Note (Signed)
Lab Results  ?Component Value Date  ? HGBA1C 5.9 04/26/2021  ? ?Stable, pt to continue current medical treatment   - diet ? ?

## 2021-09-04 NOTE — Assessment & Plan Note (Signed)
Overall stable to mild only, for xanax refill prn ?

## 2021-09-04 NOTE — Assessment & Plan Note (Signed)
For f/u carotid studies,  to f/u any worsening symptoms or concerns ?

## 2021-09-04 NOTE — Assessment & Plan Note (Signed)
?   Drop attack vs other - for f/u neurology as planned, also card event monitor r/o arrythmia ?

## 2021-09-12 ENCOUNTER — Other Ambulatory Visit: Payer: Self-pay | Admitting: Orthopaedic Surgery

## 2021-09-14 ENCOUNTER — Ambulatory Visit (HOSPITAL_COMMUNITY)
Admission: RE | Admit: 2021-09-14 | Discharge: 2021-09-14 | Disposition: A | Discharge status: Home / Self Care | Payer: Medicare Other | Source: Ambulatory Visit | Attending: Cardiovascular Disease | Admitting: Cardiovascular Disease

## 2021-09-14 ENCOUNTER — Encounter: Payer: Self-pay | Admitting: Internal Medicine

## 2021-09-14 DIAGNOSIS — I6523 Occlusion and stenosis of bilateral carotid arteries: Secondary | ICD-10-CM | POA: Diagnosis not present

## 2021-09-14 DIAGNOSIS — R296 Repeated falls: Secondary | ICD-10-CM | POA: Insufficient documentation

## 2021-10-03 ENCOUNTER — Ambulatory Visit: Payer: Medicare Other | Admitting: Neurology

## 2021-10-04 DIAGNOSIS — Z0100 Encounter for examination of eyes and vision without abnormal findings: Secondary | ICD-10-CM | POA: Diagnosis not present

## 2021-10-18 DIAGNOSIS — Z0100 Encounter for examination of eyes and vision without abnormal findings: Secondary | ICD-10-CM | POA: Diagnosis not present

## 2021-10-22 ENCOUNTER — Emergency Department (HOSPITAL_COMMUNITY): Payer: Medicare Other

## 2021-10-22 ENCOUNTER — Emergency Department (HOSPITAL_COMMUNITY)
Admission: EM | Admit: 2021-10-22 | Discharge: 2021-10-22 | Disposition: A | Discharge status: Home / Self Care | Payer: Medicare Other | Attending: Emergency Medicine | Admitting: Emergency Medicine

## 2021-10-22 ENCOUNTER — Other Ambulatory Visit: Payer: Self-pay

## 2021-10-22 ENCOUNTER — Encounter (HOSPITAL_COMMUNITY): Payer: Self-pay

## 2021-10-22 DIAGNOSIS — Z7982 Long term (current) use of aspirin: Secondary | ICD-10-CM | POA: Insufficient documentation

## 2021-10-22 DIAGNOSIS — I672 Cerebral atherosclerosis: Secondary | ICD-10-CM | POA: Diagnosis not present

## 2021-10-22 DIAGNOSIS — W11XXXA Fall on and from ladder, initial encounter: Secondary | ICD-10-CM | POA: Insufficient documentation

## 2021-10-22 DIAGNOSIS — S0990XA Unspecified injury of head, initial encounter: Secondary | ICD-10-CM

## 2021-10-22 DIAGNOSIS — S0003XA Contusion of scalp, initial encounter: Secondary | ICD-10-CM | POA: Diagnosis not present

## 2021-10-22 NOTE — Discharge Instructions (Signed)
Your CT scan of your head was normal.  Please follow-up with your family doctor in the office.  There is a very rare possibility that you have bleeding inside the skull after this normal head CT.  Please return to the Emergency Department for worsening headaches or confusion or nausea and vomiting.

## 2021-10-22 NOTE — ED Triage Notes (Signed)
Pt came in POV for a fall on full dose ASA. Pt was on a step stool taking down a valance and fell to the floor smacking head on floor.  No LOC.  No deficits. Pt has good size goose egg to posterior head.

## 2021-10-22 NOTE — Progress Notes (Signed)
Orthopedic Tech Progress Note Patient Details:  MARLISHA VANWYK 06/06/1945 [id number] Level 2 Trauma  Patient ID: [name], female   DOB: [birth year], 76 y.o.   MRN: [id number]  Jearld Lesch 10/22/2021, 1:13 PM

## 2021-10-22 NOTE — ED Provider Notes (Signed)
[facility] EMERGENCY DEPARTMENT Provider Note   CSN: [id number] Arrival date & time: 10/22/21  1241     History  Chief Complaint  Patient presents with   Head Injury    Lauren [name] is a 76 y.o. female.  76 yo F with a chief complaints of a fall.  The patient was on a ladder trying to get a screw out of the windowsill and she fell backwards and struck her head.  She denies confusion denies vomiting.  She is on a full dose aspirin.  She denies pain to her neck or back or chest or abdomen or pelvis.  She has been able to ambulate without issue.  She is most concerned that she might have broken her skull or have bleeding into her head.       Home Medications Prior to Admission medications   Medication Sig Start Date End Date Taking? Authorizing Provider  acetaminophen-codeine (TYLENOL #3) 300-30 MG tablet Take 1-2 tablets by mouth every 6 (six) hours as needed for moderate pain. 07/25/21   Barrett Henle, MD  ALPRAZolam Duanne Moron) 1 MG tablet TAKE 1/2 TO 1 TABLET BY MOUTH TWICE DAILY AS NEEDED 08/30/21   Biagio Borg, MD  amLODipine (NORVASC) 5 MG tablet TAKE 1 TABLET BY MOUTH  DAILY 05/20/21   Biagio Borg, MD  aspirin EC 325 MG tablet Take 325 mg by mouth daily.    [provider]  butalbital-acetaminophen-caffeine (FIORICET) [id number] MG tablet Take 1 tablet by mouth every 6 (six) hours as needed for headache. 07/19/21   Biagio Borg, MD  Cholecalciferol (D3 ADULT PO) Take by mouth.    [provider]  cyanocobalamin 100 MCG tablet Take 100 mcg by mouth daily.    [provider]  escitalopram (LEXAPRO) 10 MG tablet TAKE 1 TABLET BY MOUTH  DAILY Patient not taking: Reported on 07/19/2021 05/18/21   Biagio Borg, MD  hydrochlorothiazide (MICROZIDE) 12.5 MG capsule TAKE 1 CAPSULE BY MOUTH  DAILY 02/25/21   Biagio Borg, MD  ibuprofen (ADVIL) 800 MG tablet TAKE 1 TABLET(800 MG) BY MOUTH TWICE DAILY AS NEEDED 09/12/21   Marybelle Killings, MD   levothyroxine (SYNTHROID) 50 MCG tablet TAKE 1 TABLET BY MOUTH  DAILY 05/20/21   Biagio Borg, MD  RESTASIS 0.05 % ophthalmic emulsion 1 drop 2 (two) times daily. 04/18/19   [provider]  TYRVAYA 0.03 MG/ACT SOLN Place into both nostrils. Patient not taking: Reported on 08/30/2021 04/08/21   [provider]  zolpidem (AMBIEN) 5 MG tablet TAKE 1 TABLET BY MOUTH AT  BEDTIME AS NEEDED FOR SLEEP 05/10/21   Biagio Borg, MD      Allergies    Celebrex [celecoxib]    Review of Systems   Review of Systems  Physical Exam Updated Vital Signs BP (!) 128/59 (BP Location: Right Arm)   Pulse 76   Temp 98.3 F (36.8 C) (Oral)   Resp 16   Ht '5\' 4"'$  (1.626 m)   Wt 68 kg   LMP 05/22/1996   SpO2 95%   BMI 25.75 kg/m  Physical Exam Vitals and nursing note reviewed.  Constitutional:      General: She is not in acute distress.    Appearance: She is well-developed. She is not diaphoretic.  HENT:     Head: Normocephalic.     Comments: Occipital hematoma.  No midline C-spine tenderness able to rotate her head 45 degrees in either direction  without pain. Eyes:     Pupils: Pupils are equal, round, and reactive to light.  Cardiovascular:     Rate and Rhythm: Normal rate and regular rhythm.     Heart sounds: No murmur heard.   No friction rub. No gallop.  Pulmonary:     Effort: Pulmonary effort is normal.     Breath sounds: No wheezing or rales.  Abdominal:     General: There is no distension.     Palpations: Abdomen is soft.     Tenderness: There is no abdominal tenderness.  Musculoskeletal:        General: No tenderness.     Cervical back: Normal range of motion and neck supple.     Comments: Palpated from head to toe without any other noted areas of bony tenderness.  Skin:    General: Skin is warm and dry.  Neurological:     Mental Status: She is alert and oriented to person, place, and time.  Psychiatric:        Behavior: Behavior normal.    ED Results /  Procedures / Treatments   Labs (all labs ordered are listed, but only abnormal results are displayed) Labs Reviewed - No data to display  EKG None  Radiology CT Head Wo Contrast  Result Date: 10/22/2021 CLINICAL DATA:  Fall with trauma to the head EXAM: CT HEAD WITHOUT CONTRAST TECHNIQUE: Contiguous axial images were obtained from the base of the skull through the vertex without intravenous contrast. RADIATION DOSE REDUCTION: This exam was performed according to the departmental dose-optimization program which includes automated exposure control, adjustment of the mA and/or kV according to patient size and/or use of iterative reconstruction technique. COMPARISON:  MRI 07/31/2021.  CT 07/16/2021. FINDINGS: Brain: No change. No acute or traumatic finding. The brainstem and cerebellum are normal. Mild chronic small-vessel ischemic change affects the cerebral hemispheric white matter. No sign of acute infarction, mass lesion, hemorrhage, hydrocephalus or extra-axial collection. Vascular: There is atherosclerotic calcification of the major vessels at the base of the brain. Skull: Negative Sinuses/Orbits: Clear/normal Other: None IMPRESSION: No acute or traumatic finding. Mild age related volume loss. Mild chronic small-vessel ischemic change of the white matter. Electronically Signed   By: Nelson Chimes M.D.   On: 10/22/2021 13:35    Procedures Procedures    Medications Ordered in ED Medications - No data to display  ED Course/ Medical Decision Making/ A&P                           Medical Decision Making Amount and/or Complexity of Data Reviewed Radiology: ordered.   76 yo F with a chief complaints of fall.  The patient was on a ladder trying to get a screw out of the windowsill and she lost her balance and fell and struck the back of her head.  She denies any other significant injury in the fall.  Has no neck pain.  We will obtain a CT scan of the head.  As the patient is on a full dose  aspirin did not had head trauma in our hospital system this is a level 2 trauma and so will activate.  CT of the head independently turbid by me negative for skull fracture or intracranial hemorrhage.  We will discharge the patient home.  PCP follow-up.  1:42 PM:  I have discussed the diagnosis/risks/treatment options with the patient.  Evaluation and diagnostic testing in the emergency department does not suggest an  emergent condition requiring admission or immediate intervention beyond what has been performed at this time.  They will follow up with  PCP. We also discussed returning to the ED immediately if new or worsening sx occur. We discussed the sx which are most concerning (e.g., sudden worsening pain, fever, inability to tolerate by mouth) that necessitate immediate return. Medications administered to the patient during their visit and any new prescriptions provided to the patient are listed below.  Medications given during this visit Medications - No data to display   The patient appears reasonably screen and/or stabilized for discharge and I doubt any other medical condition or other [facility] requiring further screening, evaluation, or treatment in the ED at this time prior to discharge.          Final Clinical Impression(s) / ED Diagnoses Final diagnoses:  Closed head injury, initial encounter  Hematoma of occipital region of scalp    Rx / DC Orders ED Discharge Orders     None         Deno Etienne, DO 10/22/21 1342

## 2021-10-25 ENCOUNTER — Telehealth: Payer: Self-pay | Admitting: Internal Medicine

## 2021-10-25 NOTE — Telephone Encounter (Signed)
PT visits today originally thinking she had made an appointment for today with Dr.John. She stated she had made it on MyChart but wasn't sure if it had gone through or not.   PT had been dealing with a dry cough (she said this had happened about 15 years ago too her as well). I suggested we could get her in for Thursday and she had refused. PT states that all the medications she was currently taking were not working and she would like to speak with a nurse or Dr.John about this matter (potentially getting a prescription written).  CB: [phone]

## 2021-10-25 NOTE — Telephone Encounter (Signed)
Advised patient that she would need to come in for an appointment. Dr. Jenny Reichmann is unable to prescribe medication without patient being seen. Patient has an appointment 10/27/21

## 2021-10-27 ENCOUNTER — Ambulatory Visit (INDEPENDENT_AMBULATORY_CARE_PROVIDER_SITE_OTHER): Payer: Medicare Other

## 2021-10-27 ENCOUNTER — Ambulatory Visit (INDEPENDENT_AMBULATORY_CARE_PROVIDER_SITE_OTHER): Payer: Medicare Other | Admitting: Internal Medicine

## 2021-10-27 ENCOUNTER — Encounter: Payer: Self-pay | Admitting: Internal Medicine

## 2021-10-27 DIAGNOSIS — E039 Hypothyroidism, unspecified: Secondary | ICD-10-CM | POA: Diagnosis not present

## 2021-10-27 DIAGNOSIS — R739 Hyperglycemia, unspecified: Secondary | ICD-10-CM | POA: Diagnosis not present

## 2021-10-27 DIAGNOSIS — R059 Cough, unspecified: Secondary | ICD-10-CM | POA: Diagnosis not present

## 2021-10-27 DIAGNOSIS — I10 Essential (primary) hypertension: Secondary | ICD-10-CM

## 2021-10-27 MED ORDER — HYDROCODONE BIT-HOMATROP MBR 5-1.5 MG/5ML PO SOLN: 5.0000 mL | Freq: Four times a day (QID) | ORAL | 0 refills | Status: AC | PRN

## 2021-10-27 NOTE — Progress Notes (Unsigned)
Patient ID: [name], female   DOB: [birth year], 76 y.o.   MRN: [id number]        Chief Complaint: follow up persistent dry cough       HPI:  Lauren [name] is a 76 y.o. female here somewhat indignant after being asked to present for evaluation for persistent dry cough x 4 wks, has tried multiple otc cough preps without improvement, cannot sleep well at night.  Denies worsening reflux, abd pain, dysphagia, n/v, bowel change or blood.  Denies significant or worsening sinus symptoms or congestion.  Pt denies chest pain, increased sob or doe, wheezing, orthopnea, PND, increased LE swelling, palpitations, dizziness or syncope.  Pt states has had 2 similar transient episodes over the years in the past lasting weeks for unclear reason,  has not had recent cxr.   Pt denies polydipsia, polyuria, or new focal neuro s/s.  Pt denies fever, night sweats, loss of appetite, or other constitutional symptoms though has lost several lbs recently       Wt Readings from Last 3 Encounters:  10/27/21 146 lb 12.8 oz (66.6 kg)  10/22/21 150 lb (68 kg)  08/30/21 150 lb 6.4 oz (68.2 kg)   BP Readings from Last 3 Encounters:  10/27/21 118/62  10/22/21 (!) 130/59  08/30/21 124/66         Past Medical History:  Diagnosis Date   Anxiety    Arthritis    Carotid stenosis 10/08/2013   Cervical disc disease    Depression    Diverticulitis    Diverticulosis    Frequent headaches    Hypertension    does not now per pt.   Hypothyroidism 10/26/2015   Impaired glucose tolerance 10/08/2013   normal HgA1C   Neck pain    Psoriasis    Past Surgical History:  Procedure Laterality Date   BREAST BIOPSY Left 2011   benign    CERVICAL FUSION     approx 1980   CHOLECYSTECTOMY     COLONOSCOPY     POSTERIOR CERVICAL FUSION/FORAMINOTOMY N/A 11/05/2014   Procedure: Cervical four to Cervical seven  Posterior Cervical Fusion with lateral mass fixation;  Surgeon: Eustace Moore, MD;  Location: [facility];  Service:  Neurosurgery;  Laterality: N/A;   C4 - C7 Posterior Cervical Fusion with lateral mass fixation   TOTAL ABDOMINAL HYSTERECTOMY  1998   Fibroid   UPPER GASTROINTESTINAL ENDOSCOPY      reports that she has never smoked. She has never used smokeless tobacco. She reports current alcohol use. She reports that she does not use drugs. family history includes Alcohol abuse in an other family member; Alcoholism in her father; Arthritis in an other family member; Cancer in an other family member; Cervical cancer in her mother; Colon cancer in her father; Diabetes in her brother and mother; Heart attack in her mother; Heart disease in her mother and another family member; Hypertension in an other family member; Lung cancer in her father; Throat cancer in her brother. Allergies  Allergen Reactions   Celebrex [Celecoxib] Itching and Rash   Current Outpatient Medications on File Prior to Visit  Medication Sig Dispense Refill   acetaminophen-codeine (TYLENOL #3) 300-30 MG tablet Take 1-2 tablets by mouth every 6 (six) hours as needed for moderate pain. 15 tablet 0   ALPRAZolam (XANAX) 1 MG tablet TAKE 1/2 TO 1 TABLET BY MOUTH TWICE DAILY AS NEEDED 60 tablet 0   amLODipine (NORVASC) 5 MG tablet TAKE 1 TABLET BY  MOUTH  DAILY 90 tablet 3   aspirin EC 325 MG tablet Take 325 mg by mouth daily.     Cholecalciferol (D3 ADULT PO) Take by mouth.     cyanocobalamin 100 MCG tablet Take 100 mcg by mouth daily.     escitalopram (LEXAPRO) 10 MG tablet TAKE 1 TABLET BY MOUTH  DAILY 90 tablet 3   hydrochlorothiazide (MICROZIDE) 12.5 MG capsule TAKE 1 CAPSULE BY MOUTH  DAILY 90 capsule 3   levothyroxine (SYNTHROID) 50 MCG tablet TAKE 1 TABLET BY MOUTH  DAILY 90 tablet 3   RESTASIS 0.05 % ophthalmic emulsion 1 drop 2 (two) times daily.     zolpidem (AMBIEN) 5 MG tablet TAKE 1 TABLET BY MOUTH AT  BEDTIME AS NEEDED FOR SLEEP 90 tablet 1   butalbital-acetaminophen-caffeine (FIORICET) 50-325-40 MG tablet Take 1 tablet by mouth  every 6 (six) hours as needed for headache. (Patient not taking: Reported on 10/27/2021) 30 tablet 0   ibuprofen (ADVIL) 800 MG tablet TAKE 1 TABLET(800 MG) BY MOUTH TWICE DAILY AS NEEDED (Patient not taking: Reported on 10/27/2021) 60 tablet 1   TYRVAYA 0.03 MG/ACT SOLN Place into both nostrils. (Patient not taking: Reported on 08/30/2021)     No current facility-administered medications on file prior to visit.        ROS:  All others reviewed and negative.  Objective        PE:  BP 118/62 (BP Location: Right Arm, Patient Position: Sitting, Cuff Size: Normal)   Pulse 79   Temp 98.3 F (36.8 C) (Oral)   Ht '5\' 4"'$  (1.626 m)   Wt 146 lb 12.8 oz (66.6 kg)   LMP 05/22/1996   SpO2 97%   BMI 25.20 kg/m                 Constitutional: Pt appears in NAD               HENT: Head: NCAT.                Right Ear: External ear normal.                 Left Ear: External ear normal.                Eyes: . Pupils are equal, round, and reactive to light. Conjunctivae and EOM are normal               Nose: without d/c or deformity               Neck: Neck supple. Gross normal ROM               Cardiovascular: Normal rate and regular rhythm.                 Pulmonary/Chest: Effort normal and breath sounds without rales or wheezing.                Abd:  Soft, NT, ND, + BS, no organomegaly               Neurological: Pt is alert. At baseline orientation, motor grossly intact               Skin: Skin is warm. No rashes, no other new lesions, LE edema - none               Psychiatric: Pt behavior is normal without agitation   Micro: none  Cardiac tracings I have personally interpreted today:  none  Pertinent Radiological findings (summarize): none   Lab Results  Component Value Date   WBC 6.7 07/16/2021   HGB 12.9 07/16/2021   HCT 39.7 07/16/2021   PLT 209 07/16/2021   GLUCOSE 126 (H) 07/16/2021   CHOL 155 04/26/2021   TRIG 240.0 (H) 04/26/2021   HDL 29.70 (L) 04/26/2021   LDLDIRECT 75.0  04/26/2021   LDLCALC 90 10/26/2017   ALT 16 07/16/2021   AST 19 07/16/2021   NA 139 07/16/2021   K 3.7 07/16/2021   CL 104 07/16/2021   CREATININE 0.82 07/16/2021   BUN 15 07/16/2021   CO2 26 07/16/2021   TSH 3.81 04/26/2021   INR 0.9 07/16/2021   HGBA1C 5.9 04/26/2021   Assessment/Plan:  LOUCILE POSNER is a 76 y.o. White or Caucasian [1] female with  has a past medical history of Anxiety, Arthritis, Carotid stenosis (10/08/2013), Cervical disc disease, Depression, Diverticulitis, Diverticulosis, Frequent headaches, Hypertension, Hypothyroidism (10/26/2015), Impaired glucose tolerance (10/08/2013), Neck pain, and Psoriasis.  Cough Etiology unclear, exam benign, for cxr, hycodan prn, and consider pulm referral, declines ent or PPI trial or inhaler prn  Hyperglycemia Lab Results  Component Value Date   HGBA1C 5.9 04/26/2021   Stable, pt to continue current medical treatment  - diet, wt control, excercise   Essential hypertension BP Readings from Last 3 Encounters:  10/27/21 118/62  10/22/21 (!) 130/59  08/30/21 124/66   Stable, pt to continue medical treatment norvasc 5 qd, hct 12.5 qd   Hypothyroidism Lab Results  Component Value Date   TSH 3.81 04/26/2021   Stable, pt to continue levothyroxine 50 mcg qd  Followup: Return if symptoms worsen or fail to improve.  Cathlean Cower, MD 10/29/2021 4:33 PM [facility]

## 2021-10-27 NOTE — Patient Instructions (Signed)
Please take all new medication as prescribed  - the cough medicine  Please continue all other medications as before, and refills have been done if requested.  Please have the pharmacy call with any other refills you may need.  Please keep your appointments with your specialists as you may have planned  Please go to the XRAY Department in the first floor for the x-ray testing  You will be contacted by phone if any changes need to be made immediately.  Otherwise, you will receive a letter about your results with an explanation, but please check with MyChart first.  Please remember to sign up for MyChart if you have not done so, as this will be important to you in the future with finding out test results, communicating by private email, and scheduling acute appointments online when needed.

## 2021-10-29 ENCOUNTER — Encounter: Payer: Self-pay | Admitting: Internal Medicine

## 2021-10-29 NOTE — Assessment & Plan Note (Signed)
Lab Results  Component Value Date   TSH 3.81 04/26/2021   Stable, pt to continue levothyroxine 50 mcg qd

## 2021-10-29 NOTE — Assessment & Plan Note (Addendum)
Etiology unclear, exam benign, for cxr, hycodan prn, and consider pulm referral, declines ent or PPI trial or inhaler prn

## 2021-10-29 NOTE — Assessment & Plan Note (Signed)
BP Readings from Last 3 Encounters:  10/27/21 118/62  10/22/21 (!) 130/59  08/30/21 124/66   Stable, pt to continue medical treatment norvasc 5 qd, hct 12.5 qd

## 2021-10-29 NOTE — Assessment & Plan Note (Signed)
Lab Results  Component Value Date   HGBA1C 5.9 04/26/2021   Stable, pt to continue current medical treatment  - diet, wt control, excercise

## 2021-11-05 ENCOUNTER — Other Ambulatory Visit: Payer: Self-pay | Admitting: Internal Medicine

## 2021-11-05 NOTE — Telephone Encounter (Signed)
Please refill as per office routine med refill policy (all routine meds to be refilled for 3 mo or monthly (per pt preference) up to one year from last visit, then month to month grace period for 3 mo, then further med refills will have to be denied) ? ?

## 2021-11-07 ENCOUNTER — Telehealth: Payer: Self-pay | Admitting: Internal Medicine

## 2021-11-07 MED ORDER — HYDROCODONE BIT-HOMATROP MBR 5-1.5 MG/5ML PO SOLN: 5.0000 mL | Freq: Four times a day (QID) | ORAL | 0 refills | Status: AC | PRN

## 2021-11-07 NOTE — Telephone Encounter (Signed)
Done erx 

## 2021-11-07 NOTE — Telephone Encounter (Signed)
Pt.states needs med.refill for cough also requesting call back from nurse to discuss X-ray results.  Pt was not able to give name of cough med.

## 2021-11-07 NOTE — Telephone Encounter (Signed)
Discussed results with patient. Patient requesting refill of Hydrocodone cough syrup

## 2021-11-08 NOTE — Telephone Encounter (Signed)
Patient notified

## 2021-11-11 ENCOUNTER — Other Ambulatory Visit: Payer: Self-pay | Admitting: Orthopaedic Surgery

## 2021-11-15 ENCOUNTER — Telehealth: Payer: Self-pay

## 2021-11-15 NOTE — Telephone Encounter (Signed)
Sorry, I dont see this flare up on her chart from June 26, and of course tramadol can  make constipation worse, so since we cant be sure about what she has, we should hold off

## 2021-11-15 NOTE — Telephone Encounter (Signed)
Pt is calling request a refill on: traMADol (ULTRAM) 50 MG tablet  D/C as of 07/21/19 For Diverticulitis flare up 11/14/21  Pharmacy: [id number] - [place], [place] - [address] AT [facility] OF [address]   LOV 10/27/21

## 2021-11-17 ENCOUNTER — Ambulatory Visit: Payer: Medicare Other | Admitting: Internal Medicine

## 2021-12-01 ENCOUNTER — Telehealth: Payer: Self-pay | Admitting: Internal Medicine

## 2021-12-01 NOTE — Telephone Encounter (Signed)
Caller & Relationship to patient: Lauren [name]   Call back number: [phone]  Date of last office visit: 10/27/21  Date of next office visit: 05/02/22  Medication(s) to be refilled:  zolpidem (AMBIEN) 5 MG tablet    Preferred Pharmacy:  [facility] ) - [place], [place] Phone:  [phone]  Fax:  [phone]

## 2021-12-02 ENCOUNTER — Telehealth: Payer: Self-pay

## 2021-12-02 ENCOUNTER — Telehealth: Payer: Self-pay | Admitting: *Deleted

## 2021-12-02 MED ORDER — ZOLPIDEM TARTRATE 5 MG PO TABS: 5.0000 mg | ORAL_TABLET | Freq: Every evening | ORAL | 1 refills | Status: DC | PRN

## 2021-12-02 NOTE — Telephone Encounter (Signed)
Ok to contact pt  We did the refill of ambien to mail in pharmacy  We are also still hoping she will start the cardiac monitor, as we are informed she has not yet started this.   thanks

## 2021-12-02 NOTE — Telephone Encounter (Signed)
Audrea Muscat from [facility] called to make the provider aware that a cardiac event monitor was ordered for the patient a while back and that monitored has not been activated by the patient.

## 2021-12-02 NOTE — Telephone Encounter (Signed)
Received notification from [facility] that patient never activated the Cardiac Event Monitor that was ordered in April.   Called Dr Jeneen Rinks John's office and spoke with Don'Quashia CMA to make them aware.

## 2021-12-23 ENCOUNTER — Encounter: Payer: Self-pay | Admitting: Internal Medicine

## 2021-12-23 ENCOUNTER — Ambulatory Visit (INDEPENDENT_AMBULATORY_CARE_PROVIDER_SITE_OTHER): Payer: Medicare Other | Admitting: Internal Medicine

## 2021-12-23 VITALS — BP 126/72 | HR 82 | Temp 98.2°F | Ht 64.0 in | Wt 141.0 lb

## 2021-12-23 DIAGNOSIS — F419 Anxiety disorder, unspecified: Secondary | ICD-10-CM

## 2021-12-23 DIAGNOSIS — M542 Cervicalgia: Secondary | ICD-10-CM | POA: Diagnosis not present

## 2021-12-23 DIAGNOSIS — I10 Essential (primary) hypertension: Secondary | ICD-10-CM

## 2021-12-23 DIAGNOSIS — F5101 Primary insomnia: Secondary | ICD-10-CM

## 2021-12-23 DIAGNOSIS — E559 Vitamin D deficiency, unspecified: Secondary | ICD-10-CM

## 2021-12-23 DIAGNOSIS — J029 Acute pharyngitis, unspecified: Secondary | ICD-10-CM

## 2021-12-23 DIAGNOSIS — R221 Localized swelling, mass and lump, neck: Secondary | ICD-10-CM

## 2021-12-23 LAB — BASIC METABOLIC PANEL
BUN: 15 mg/dL (ref 6–23)
CO2: 32 mEq/L (ref 19–32)
Calcium: 9.6 mg/dL (ref 8.4–10.5)
Chloride: 101 mEq/L (ref 96–112)
Creatinine, Ser: 0.8 mg/dL (ref 0.40–1.20)
GFR: 71.37 mL/min (ref >60.00)
Glucose, Bld: 91 mg/dL (ref 70–99)
Potassium: 3.7 mEq/L (ref 3.5–5.1)
Sodium: 140 mEq/L (ref 135–145)

## 2021-12-23 MED ORDER — TEMAZEPAM 15 MG PO CAPS: ORAL_CAPSULE | ORAL | 1 refills | Status: DC

## 2021-12-23 NOTE — Progress Notes (Signed)
Patient ID: Lauren [name], female   DOB: 10-07-1945, 76 y.o.   MRN: [id number]

## 2021-12-25 ENCOUNTER — Encounter: Payer: Self-pay | Admitting: Internal Medicine

## 2021-12-25 NOTE — Assessment & Plan Note (Signed)
Pt declines need for change in tx or grief counseling at this time

## 2021-12-25 NOTE — Assessment & Plan Note (Signed)
Last vitamin D Lab Results  Component Value Date   VD25OH 49.05 04/26/2021   Stable, cont oral replacement

## 2021-12-25 NOTE — Assessment & Plan Note (Signed)
BP Readings from Last 3 Encounters:  12/23/21 126/72  10/27/21 118/62  10/22/21 (!) 130/59   Stable, pt to continue medical treatment norvasc 5 mg, hct 12.5 mg

## 2021-12-25 NOTE — Assessment & Plan Note (Signed)
No palpable tender area but "deep and pt distraught over this - for CT as above

## 2021-12-25 NOTE — Patient Instructions (Signed)
Please take all new medication as prescribed - the temazepam  You will be contacted regarding the referral for: ENT and CT scan  Please continue all other medications as before, and refills have been done if requested.  Please have the pharmacy call with any other refills you may need.  Please continue your efforts at being more active, low cholesterol diet, and weight control.  Please keep your appointments with your specialists as you may have planned  Please go to the LAB at the blood drawing area for the tests to be done - just the kidney testing today

## 2021-12-25 NOTE — Assessment & Plan Note (Addendum)
Subjective with pain to the mid center anterior neck deep to palpation, etiology unclear, pt tearful and mentions several times her brother who died of throat Cancer - ok for CT neck with renal check prior , refer ENT per pt reqeust

## 2021-12-25 NOTE — Assessment & Plan Note (Signed)
[place] for change ambient to temazepam 15-30 mg qhs prn,  to f/u any worsening symptoms or concerns

## 2021-12-25 NOTE — Progress Notes (Signed)
Patient ID: Lauren [name], female   DOB: 04-May-1946, 76 y.o.   MRN: [id number]        Chief Complaint: follow up with anterior neck pain and knot sensation, insomnia, htn, anxiety       HPI:  Lauren [name] is a 76 y.o. female here with hx of anxiety, tense demeanor, recent hx of brother died with throat cancer, now with 1 wk worsening anterior neck pain, deep, mod to occasionally severe subjectively with a knot sensation, but non tender to touch the anterior neck.  Denies worsening swallow solids or liquid, or dyspnea and Pt denies chest pain, increased sob or doe, wheezing, orthopnea, PND, increased LE swelling, palpitations, dizziness or syncope.   Pt denies polydipsia, polyuria, or new focal neuro s/s.    Pt denies fever, wt loss, night sweats, loss of appetite, or other constitutional symptoms  Tearful today and has to steady and compose herself to answer history questions.  Denies worsening depressive symptoms, suicidal ideation, or panic; but does have worsening insomnia with getting to sleep and staying asleep most nights, ambien just not working well anymore.        Wt Readings from Last 3 Encounters:  12/23/21 141 lb (64 kg)  10/27/21 146 lb 12.8 oz (66.6 kg)  10/22/21 150 lb (68 kg)   BP Readings from Last 3 Encounters:  12/23/21 126/72  10/27/21 118/62  10/22/21 (!) 130/59         Past Medical History:  Diagnosis Date   Anxiety    Arthritis    Carotid stenosis 10/08/2013   Cervical disc disease    Depression    Diverticulitis    Diverticulosis    Frequent headaches    Hypertension    does not now per pt.   Hypothyroidism 10/26/2015   Impaired glucose tolerance 10/08/2013   normal HgA1C   Neck pain    Psoriasis    Past Surgical History:  Procedure Laterality Date   BREAST BIOPSY Left 2011   benign    CERVICAL FUSION     approx 1980   CHOLECYSTECTOMY     COLONOSCOPY     POSTERIOR CERVICAL FUSION/FORAMINOTOMY N/A 11/05/2014   Procedure: Cervical four to Cervical  seven  Posterior Cervical Fusion with lateral mass fixation;  Surgeon: Eustace Moore, MD;  Location: [facility];  Service: Neurosurgery;  Laterality: N/A;   C4 - C7 Posterior Cervical Fusion with lateral mass fixation   TOTAL ABDOMINAL HYSTERECTOMY  1998   Fibroid   UPPER GASTROINTESTINAL ENDOSCOPY      reports that she has never smoked. She has never used smokeless tobacco. She reports current alcohol use. She reports that she does not use drugs. family history includes Alcohol abuse in an other family member; Alcoholism in her father; Arthritis in an other family member; Cancer in an other family member; Cervical cancer in her mother; Colon cancer in her father; Diabetes in her brother and mother; Heart attack in her mother; Heart disease in her mother and another family member; Hypertension in an other family member; Lung cancer in her father; Throat cancer in her brother. Allergies  Allergen Reactions   Celebrex [Celecoxib] Itching and Rash   Current Outpatient Medications on File Prior to Visit  Medication Sig Dispense Refill   acetaminophen-codeine (TYLENOL #3) 300-30 MG tablet Take 1-2 tablets by mouth every 6 (six) hours as needed for moderate pain. 15 tablet 0   ALPRAZolam (XANAX) 1 MG tablet TAKE 1/2 TO 1 TABLET  BY MOUTH TWICE DAILY AS NEEDED 60 tablet 0   amLODipine (NORVASC) 5 MG tablet TAKE 1 TABLET BY MOUTH  DAILY 90 tablet 3   aspirin EC 325 MG tablet Take 325 mg by mouth daily.     butalbital-acetaminophen-caffeine (FIORICET) 50-325-40 MG tablet Take 1 tablet by mouth every 6 (six) hours as needed for headache. 30 tablet 0   Cholecalciferol (D3 ADULT PO) Take by mouth.     cyanocobalamin 100 MCG tablet Take 100 mcg by mouth daily.     escitalopram (LEXAPRO) 10 MG tablet TAKE 1 TABLET BY MOUTH  DAILY 90 tablet 3   hydrochlorothiazide (MICROZIDE) 12.5 MG capsule TAKE 1 CAPSULE BY MOUTH  DAILY 90 capsule 2   ibuprofen (ADVIL) 800 MG tablet TAKE 1 TABLET(800 MG) BY MOUTH TWICE  DAILY AS NEEDED 60 tablet 1   levothyroxine (SYNTHROID) 50 MCG tablet TAKE 1 TABLET BY MOUTH  DAILY 90 tablet 3   RESTASIS 0.05 % ophthalmic emulsion 1 drop 2 (two) times daily.     TYRVAYA 0.03 MG/ACT SOLN Place into both nostrils.     zolpidem (AMBIEN) 5 MG tablet Take 1 tablet (5 mg total) by mouth at bedtime as needed. for sleep 90 tablet 1   No current facility-administered medications on file prior to visit.        ROS:  All others reviewed and negative.  Objective        PE:  BP 126/72 (BP Location: Right Arm, Patient Position: Sitting, Cuff Size: Normal)   Pulse 82   Temp 98.2 F (36.8 C) (Oral)   Ht '5\' 4"'$  (1.626 m)   Wt 141 lb (64 kg)   LMP 05/22/1996   SpO2 97%   BMI 24.20 kg/m                 Constitutional: Pt appears in NAD               HENT: Head: NCAT.                Right Ear: External ear normal.                 Left Ear: External ear normal.                Eyes: . Pupils are equal, round, and reactive to light. Conjunctivae and EOM are normal               Nose: without d/c or deformity               Neck: Neck supple. Gross normal ROM, non tender , no mass               Cardiovascular: Normal rate and regular rhythm.                 Pulmonary/Chest: Effort normal and breath sounds without rales or wheezing.                               Neurological: Pt is alert. At baseline orientation, motor grossly intact               Skin: Skin is warm. No rashes, no other new lesions, LE edema - none               Psychiatric: Pt behavior is normal without agitation , tearful, tense, nervous  Micro: none  Cardiac tracings I have personally  interpreted today:  none  Pertinent Radiological findings (summarize): none   Lab Results  Component Value Date   WBC 6.7 07/16/2021   HGB 12.9 07/16/2021   HCT 39.7 07/16/2021   PLT 209 07/16/2021   GLUCOSE 91 12/23/2021   CHOL 155 04/26/2021   TRIG 240.0 (H) 04/26/2021   HDL 29.70 (L) 04/26/2021   LDLDIRECT 75.0  04/26/2021   LDLCALC 90 10/26/2017   ALT 16 07/16/2021   AST 19 07/16/2021   NA 140 12/23/2021   K 3.7 12/23/2021   CL 101 12/23/2021   CREATININE 0.80 12/23/2021   BUN 15 12/23/2021   CO2 32 12/23/2021   TSH 3.81 04/26/2021   INR 0.9 07/16/2021   HGBA1C 5.9 04/26/2021   Assessment/Plan:  Lauren [name] is a 76 y.o. White or Caucasian [1] female with  has a past medical history of Anxiety, Arthritis, Carotid stenosis (10/08/2013), Cervical disc disease, Depression, Diverticulitis, Diverticulosis, Frequent headaches, Hypertension, Hypothyroidism (10/26/2015), Impaired glucose tolerance (10/08/2013), Neck pain, and Psoriasis.  Vitamin D deficiency Last vitamin D Lab Results  Component Value Date   VD25OH 49.05 04/26/2021   Stable, cont oral replacement   Neck mass Subjective with pain to the mid center anterior neck deep to palpation, etiology unclear, pt tearful and mentions several times her brother who died of throat Cancer - ok for CT neck with renal check prior , refer ENT per pt reqeust  Anterior neck pain No palpable tender area but "deep and pt distraught over this - for CT as above  Anxiety Pt declines need for change in tx or grief counseling at this time  Essential hypertension BP Readings from Last 3 Encounters:  12/23/21 126/72  10/27/21 118/62  10/22/21 (!) 130/59   Stable, pt to continue medical treatment norvasc 5 mg, hct 12.5 mg   Insomnia Ok for change ambient to temazepam 15-30 mg qhs prn,  to f/u any worsening symptoms or concerns Followup: Return if symptoms worsen or fail to improve.  Cathlean Cower, MD 12/25/2021 6:22 PM [facility]

## 2021-12-26 ENCOUNTER — Telehealth: Payer: Self-pay | Admitting: Internal Medicine

## 2021-12-26 NOTE — Telephone Encounter (Signed)
Pt is requesting temazepam (RESTORIL) 15 MG capsule. She said it was originally sent to [facility] but it is going to cost her $38 so she would like it sent to  [facility] - [place], [place] Phone:  [phone]  Fax:  [phone]

## 2021-12-26 NOTE — Telephone Encounter (Signed)
Notified patient that we are unable to send the controlled medication to a mail order pharmacy. Patient verbalizes understanding

## 2022-01-06 ENCOUNTER — Ambulatory Visit: Payer: Medicare Other | Admitting: Gastroenterology

## 2022-01-06 ENCOUNTER — Ambulatory Visit (INDEPENDENT_AMBULATORY_CARE_PROVIDER_SITE_OTHER): Payer: Medicare Other | Admitting: Podiatry

## 2022-01-06 ENCOUNTER — Encounter: Payer: Self-pay | Admitting: Podiatry

## 2022-01-06 DIAGNOSIS — M21621 Bunionette of right foot: Secondary | ICD-10-CM

## 2022-01-06 DIAGNOSIS — M7751 Other enthesopathy of right foot: Secondary | ICD-10-CM | POA: Diagnosis not present

## 2022-01-06 MED ORDER — TRIAMCINOLONE ACETONIDE 10 MG/ML IJ SUSP
10.0000 mg | Freq: Once | INTRAMUSCULAR | Status: AC
  Administered 2022-01-06: 10 mg

## 2022-01-08 NOTE — Progress Notes (Signed)
Subjective:   Patient ID: Lauren [name], female   DOB: 76 y.o.   MRN: [id number]   HPI Patient is developed a lot of inflammation and pain around the outside of her right foot fifth metatarsal over the last couple months and does not remember injury.  States it feels like she is walking on the bone and there is fluid and she does not smoke likes to be active   Review of Systems  All other systems reviewed and are negative.       Objective:  Physical Exam Vitals and nursing note reviewed.  Constitutional:      Appearance: She is well-developed.  Pulmonary:     Effort: Pulmonary effort is normal.  Musculoskeletal:        General: Normal range of motion.  Skin:    General: Skin is warm.  Neurological:     Mental Status: She is alert.     Neurovascular status intact muscle strength adequate range of motion within normal limits with patient found to have a prominent fifth metatarsal head right with plantar inflammation fluid buildup around the head that is painful when pressed and makes walking difficult.  Patient has good digital perfusion well oriented x3     Assessment:  Acute inflammation with tailor's bunion deformity and capsulitis fifth MPJ right     Plan:  H&P condition reviewed recommended trying conservative with consideration for head resection if symptoms do not reduce.  I did sterile prep injected the plantar capsule 3 mg dexamethasone Kenalog 5 mg Xylocaine applied reverse dancers pad to take pressure off the joint surface and instructed on rigid bottom shoes.  Reappoint as symptoms indicate  X-rays were negative for signs of fracture did indicate enlargement around the area

## 2022-01-10 ENCOUNTER — Other Ambulatory Visit: Payer: Self-pay | Admitting: Orthopaedic Surgery

## 2022-01-17 DIAGNOSIS — R053 Chronic cough: Secondary | ICD-10-CM | POA: Diagnosis not present

## 2022-01-18 ENCOUNTER — Inpatient Hospital Stay: Admission: RE | Admit: 2022-01-18 | Payer: Medicare Other | Source: Ambulatory Visit

## 2022-01-19 ENCOUNTER — Encounter: Payer: Self-pay | Admitting: Internal Medicine

## 2022-01-19 ENCOUNTER — Ambulatory Visit
Admission: RE | Admit: 2022-01-19 | Discharge: 2022-01-19 | Disposition: A | Discharge status: Home / Self Care | Payer: Medicare Other | Source: Ambulatory Visit | Attending: Internal Medicine | Admitting: Internal Medicine

## 2022-01-19 DIAGNOSIS — R490 Dysphonia: Secondary | ICD-10-CM | POA: Diagnosis not present

## 2022-01-19 DIAGNOSIS — I6523 Occlusion and stenosis of bilateral carotid arteries: Secondary | ICD-10-CM | POA: Diagnosis not present

## 2022-01-19 DIAGNOSIS — E041 Nontoxic single thyroid nodule: Secondary | ICD-10-CM | POA: Diagnosis not present

## 2022-01-19 DIAGNOSIS — K110 Atrophy of salivary gland: Secondary | ICD-10-CM | POA: Diagnosis not present

## 2022-01-19 DIAGNOSIS — J029 Acute pharyngitis, unspecified: Secondary | ICD-10-CM

## 2022-01-19 MED ORDER — IOPAMIDOL (ISOVUE-300) INJECTION 61%
80.0000 mL | Freq: Once | INTRAVENOUS | Status: AC | PRN
  Administered 2022-01-19: 80 mL via INTRAVENOUS

## 2022-01-24 ENCOUNTER — Ambulatory Visit
Admission: RE | Admit: 2022-01-24 | Discharge: 2022-01-24 | Disposition: A | Discharge status: Home / Self Care | Payer: Medicare Other | Source: Ambulatory Visit | Attending: Otolaryngology | Admitting: Otolaryngology

## 2022-01-24 ENCOUNTER — Other Ambulatory Visit: Payer: Self-pay | Admitting: Otolaryngology

## 2022-01-24 DIAGNOSIS — E041 Nontoxic single thyroid nodule: Secondary | ICD-10-CM

## 2022-01-24 DIAGNOSIS — E042 Nontoxic multinodular goiter: Secondary | ICD-10-CM | POA: Diagnosis not present

## 2022-01-25 ENCOUNTER — Ambulatory Visit: Payer: Medicare Other | Admitting: Internal Medicine

## 2022-02-01 ENCOUNTER — Other Ambulatory Visit: Payer: Self-pay | Admitting: Internal Medicine

## 2022-02-02 ENCOUNTER — Encounter: Payer: Self-pay | Admitting: Podiatry

## 2022-02-02 ENCOUNTER — Ambulatory Visit (INDEPENDENT_AMBULATORY_CARE_PROVIDER_SITE_OTHER): Payer: Medicare Other | Admitting: Podiatry

## 2022-02-02 DIAGNOSIS — M21621 Bunionette of right foot: Secondary | ICD-10-CM

## 2022-02-02 NOTE — Telephone Encounter (Signed)
Please refill as per office routine med refill policy (all routine meds to be refilled for 3 mo or monthly (per pt preference) up to one year from last visit, then month to month grace period for 3 mo, then further med refills will have to be denied) ? ?

## 2022-02-03 NOTE — Progress Notes (Signed)
Subjective:   Patient ID: [name], female   DOB: 76 y.o.   MRN: [id number]   HPI Patient presents stating that this has not improved and it is very sore and she feels like she is walking on a abnormal bone and medication and padding did not make a big difference and she would like to get this corrected   ROS      Objective:  Physical Exam  Neuro vascular status intact with patient found to have prominence of the fifth metatarsal head right with inflammation pain associated with it and difficulty walking on the outside of her right foot     Assessment:  Tailor's bunion deformity right that is painful when pressed with diminishment of the fat pad and no callus to trim     Plan:  Reviewed condition at great length.  I do think fifth metatarsal head resection may be her best alternative and I explained this to her reviewing what would be required.  Patient wants to undergo this and understands surgery and I allowed her to read consent form going over alternative treatments complications.  Patient is scheduled for outpatient surgery due to the fact we will be removing the bone I did apply her fracture walker today that was fitted properly and I want her to wear it prior to the procedure to get used to it and to reduce some of the inflammation before surgery.  All questions answered as understands total recovery can take upwards of 6 months

## 2022-02-07 ENCOUNTER — Telehealth: Payer: Self-pay | Admitting: Urology

## 2022-02-07 NOTE — Telephone Encounter (Signed)
DOS - 02/14/22  METATARSAL HEAD RESECTION 5TH RIGHT --- [address]  [facility] EFFECTIVE DATE - 05/22/21  PLAN DEDUCTIBLE - $0.00 OUT OF POCKET - $3,600.00 W/ $2,599.66 REMAINING COINSURANCE - 0% COPAY - $295.00   PER [employer] WEBSITE FOR CPT CODE 08569 Notification or Prior Authorization is not required for the requested services  Decision ID #:[id number]

## 2022-02-13 MED ORDER — HYDROCODONE-ACETAMINOPHEN 10-325 MG PO TABS: 1.0000 | ORAL_TABLET | Freq: Three times a day (TID) | ORAL | 0 refills | Status: AC | PRN

## 2022-02-13 NOTE — Addendum Note (Signed)
Addended by: Wallene Huh on: 02/13/2022 04:39 PM   Modules accepted: Orders

## 2022-02-14 ENCOUNTER — Encounter: Payer: Self-pay | Admitting: Podiatry

## 2022-02-14 DIAGNOSIS — M2011 Hallux valgus (acquired), right foot: Secondary | ICD-10-CM | POA: Diagnosis not present

## 2022-02-14 DIAGNOSIS — M21621 Bunionette of right foot: Secondary | ICD-10-CM | POA: Diagnosis not present

## 2022-02-15 ENCOUNTER — Telehealth: Payer: Self-pay | Admitting: Podiatry

## 2022-02-15 NOTE — Telephone Encounter (Signed)
Tell her to take her boot off and ace wrap. She should not have that kind of pain. Please call back asap

## 2022-02-15 NOTE — Telephone Encounter (Signed)
Called patient back and had her take off the ace bandage and the boot, she is having a lot of swelling and pain.  She took 4 Ibuprofen last night with the Tylenol with Codeine and took 4 of them today with no relief, how is she to walk around if she doesn't have the boot on?  She said she need better instructions.  Please advise

## 2022-02-15 NOTE — Telephone Encounter (Signed)
Pharmacy - [facility] on [address] and [address]  Patient needs something for pain stronger than what you gave her , she has tried everything you told her to do and her pain level is above 10.

## 2022-02-16 NOTE — Telephone Encounter (Signed)
Thank you Caryl Pina. She is more than welcome to give our on call provider a call this weekend if the pain is intolerable.

## 2022-02-16 NOTE — Telephone Encounter (Signed)
Spoke to the patient and she stated that she slept better last night after she took the ace bandage off due to it being too tight. Patient states that she has been wearing the boot this morning and wrapped her foot back up with the ace bandage but not as tight as it was before. Patient is doing much better and has been taking the Ibuprofen alternating with the tylenol every 4 to 6 hrs as well. Patient will see [place] on Monday.

## 2022-02-20 ENCOUNTER — Ambulatory Visit (INDEPENDENT_AMBULATORY_CARE_PROVIDER_SITE_OTHER): Payer: Medicare Other

## 2022-02-20 ENCOUNTER — Ambulatory Visit (INDEPENDENT_AMBULATORY_CARE_PROVIDER_SITE_OTHER): Payer: Medicare Other | Admitting: Podiatry

## 2022-02-20 ENCOUNTER — Encounter: Payer: Self-pay | Admitting: Podiatry

## 2022-02-20 DIAGNOSIS — M21621 Bunionette of right foot: Secondary | ICD-10-CM | POA: Diagnosis not present

## 2022-02-21 NOTE — Progress Notes (Signed)
Subjective:   Patient ID: Lauren [name], female   DOB: 76 y.o.   MRN: [id number]   HPI Patient states doing very well with surgery very pleased at this time   ROS      Objective:  Physical Exam  Neurovascular status intact negative Bevelyn Buckles' sign noted wound edges coapted well excellent alignment noted     Assessment:  Doing well post metatarsal head resection fifth right      Plan:  Reapplied sterile dressing instructed on continued elevation compression reappoint to recheck again as needed  X-rays indicate that there is satisfactory resection head of fifth metatarsal head right

## 2022-02-23 ENCOUNTER — Telehealth: Payer: Self-pay | Admitting: *Deleted

## 2022-02-23 NOTE — Telephone Encounter (Signed)
Patient is calling because her right foot is still painful , surgical area.Ibuprofen,tylenol, relaxing the foot is not helping.  Please advise

## 2022-02-27 NOTE — Telephone Encounter (Signed)
Normal to still have pain. If not inmproving can come back in

## 2022-02-28 NOTE — Telephone Encounter (Signed)
Patient has been notified of physician's instructions if not improving to call back to schedule appointment.

## 2022-03-02 ENCOUNTER — Ambulatory Visit (INDEPENDENT_AMBULATORY_CARE_PROVIDER_SITE_OTHER): Payer: Medicare Other

## 2022-03-02 ENCOUNTER — Ambulatory Visit (INDEPENDENT_AMBULATORY_CARE_PROVIDER_SITE_OTHER): Payer: Medicare Other | Admitting: Podiatry

## 2022-03-02 DIAGNOSIS — M79671 Pain in right foot: Secondary | ICD-10-CM

## 2022-03-02 DIAGNOSIS — M21621 Bunionette of right foot: Secondary | ICD-10-CM | POA: Diagnosis not present

## 2022-03-02 MED ORDER — DOXYCYCLINE HYCLATE 100 MG PO TABS: 100.0000 mg | ORAL_TABLET | Freq: Two times a day (BID) | ORAL | 1 refills | Status: DC

## 2022-03-03 NOTE — Progress Notes (Signed)
Subjective:   Patient ID: Lauren [name], female   DOB: 76 y.o.   MRN: [id number]   HPI Patient states she is having a lot of pain at the operative site on the right foot.  When I had seen her to weeks ago she seemed to be doing better but it has become sore again over this last week   ROS      Objective:  Physical Exam  Neurovascular status found to be intact negative Lauren [name]' sign was noted.  Incision site is healing well there is no drainage there is no erythema there is no indications of any pathology within the incision site itself.  The area around where the bone was resected is swollen and it appears to be inflamed but I did not note any proximal edema erythema drainage noted and patient has no systemic signs of infection     Assessment:  Difficult to say whether there may be a subtle infective process or an inflammatory process present with no overt signs of infection drainage erythema     Plan:  H&P x-rays reviewed discussed with Lauren [name].  At this point I am going to place her on an antibiotic doxycycline twice a day and I want her to soak and use elevation and I am going to give it a week and reevaluate.  If any changes were to occur she is to contact us immediately either locally or systemically  X-ray looks good I did not see any other changes except for some swelling around the area versus previous x-ray with bone resection adequate

## 2022-03-05 ENCOUNTER — Other Ambulatory Visit (INDEPENDENT_AMBULATORY_CARE_PROVIDER_SITE_OTHER): Payer: Medicare Other | Admitting: Podiatry

## 2022-03-05 ENCOUNTER — Encounter: Payer: Self-pay | Admitting: Podiatry

## 2022-03-05 DIAGNOSIS — Z9889 Other specified postprocedural states: Secondary | ICD-10-CM

## 2022-03-05 DIAGNOSIS — M21621 Bunionette of right foot: Secondary | ICD-10-CM

## 2022-03-05 MED ORDER — OXYCODONE-ACETAMINOPHEN 5-325 MG PO TABS: 1.0000 | ORAL_TABLET | ORAL | 0 refills | Status: AC | PRN

## 2022-03-05 NOTE — Progress Notes (Signed)
Patient called she is having pain and swelling at surgical site s/p tailors bunionectomy with 5th met head resection DOS 9/26. She is upset at her postoperative progress and that she is still having pain at the surgical site. She denies having any opening of the incision, drainage or significant redness at the site. Does report swelling of the 5th toe on the right foot. Taking tylenol and ibuprofen without relief. She reports no one is taking her pain seriously and she is frustrated with her postop course. Was apologetic and stated I am sorry she is having pain and will send her stronger pain medication. Rx for percocet 5-325 x 20 tablets take every 4 hours as needed for severe pain. I also encouraged her to call the office tomorrow am to get an appointment ahead of currently scheduled follow up. She states she will do this. I also discussed if she is concerned for infection including having wound dehiscence, drainage or if she develops nausea vomiting fever to report to the emergency department for further evaluation. Recommend she finish any oral antibiotics previously prescribed.

## 2022-03-06 ENCOUNTER — Encounter (HOSPITAL_COMMUNITY): Payer: Self-pay

## 2022-03-06 ENCOUNTER — Other Ambulatory Visit: Payer: Self-pay

## 2022-03-06 ENCOUNTER — Inpatient Hospital Stay (HOSPITAL_COMMUNITY)
Admission: EM | Admit: 2022-03-06 | Discharge: 2022-03-09 | DRG: 857 | Disposition: A | Discharge status: Home / Self Care | Payer: Medicare Other | Attending: Internal Medicine | Admitting: Internal Medicine

## 2022-03-06 ENCOUNTER — Emergency Department (HOSPITAL_COMMUNITY): Payer: Medicare Other

## 2022-03-06 ENCOUNTER — Telehealth: Payer: Self-pay | Admitting: Podiatry

## 2022-03-06 DIAGNOSIS — T8143XA Infection following a procedure, organ and space surgical site, initial encounter: Secondary | ICD-10-CM | POA: Diagnosis not present

## 2022-03-06 DIAGNOSIS — Z808 Family history of malignant neoplasm of other organs or systems: Secondary | ICD-10-CM | POA: Diagnosis not present

## 2022-03-06 DIAGNOSIS — I10 Essential (primary) hypertension: Secondary | ICD-10-CM | POA: Diagnosis present

## 2022-03-06 DIAGNOSIS — E876 Hypokalemia: Secondary | ICD-10-CM | POA: Diagnosis present

## 2022-03-06 DIAGNOSIS — F419 Anxiety disorder, unspecified: Secondary | ICD-10-CM | POA: Diagnosis present

## 2022-03-06 DIAGNOSIS — R609 Edema, unspecified: Secondary | ICD-10-CM | POA: Diagnosis not present

## 2022-03-06 DIAGNOSIS — E861 Hypovolemia: Secondary | ICD-10-CM | POA: Diagnosis present

## 2022-03-06 DIAGNOSIS — I6529 Occlusion and stenosis of unspecified carotid artery: Secondary | ICD-10-CM | POA: Diagnosis not present

## 2022-03-06 DIAGNOSIS — T502X5A Adverse effect of carbonic-anhydrase inhibitors, benzothiadiazides and other diuretics, initial encounter: Secondary | ICD-10-CM | POA: Diagnosis present

## 2022-03-06 DIAGNOSIS — R600 Localized edema: Secondary | ICD-10-CM | POA: Diagnosis not present

## 2022-03-06 DIAGNOSIS — E039 Hypothyroidism, unspecified: Secondary | ICD-10-CM | POA: Diagnosis present

## 2022-03-06 DIAGNOSIS — M60073 Infective myositis, right foot: Secondary | ICD-10-CM | POA: Diagnosis not present

## 2022-03-06 DIAGNOSIS — M86671 Other chronic osteomyelitis, right ankle and foot: Secondary | ICD-10-CM | POA: Diagnosis not present

## 2022-03-06 DIAGNOSIS — Z8049 Family history of malignant neoplasm of other genital organs: Secondary | ICD-10-CM

## 2022-03-06 DIAGNOSIS — L02611 Cutaneous abscess of right foot: Secondary | ICD-10-CM | POA: Diagnosis not present

## 2022-03-06 DIAGNOSIS — Z79899 Other long term (current) drug therapy: Secondary | ICD-10-CM

## 2022-03-06 DIAGNOSIS — Z8249 Family history of ischemic heart disease and other diseases of the circulatory system: Secondary | ICD-10-CM | POA: Diagnosis not present

## 2022-03-06 DIAGNOSIS — M869 Osteomyelitis, unspecified: Principal | ICD-10-CM

## 2022-03-06 DIAGNOSIS — M86171 Other acute osteomyelitis, right ankle and foot: Secondary | ICD-10-CM | POA: Diagnosis not present

## 2022-03-06 DIAGNOSIS — Z90710 Acquired absence of both cervix and uterus: Secondary | ICD-10-CM | POA: Diagnosis not present

## 2022-03-06 DIAGNOSIS — L409 Psoriasis, unspecified: Secondary | ICD-10-CM | POA: Diagnosis present

## 2022-03-06 DIAGNOSIS — Z886 Allergy status to analgesic agent status: Secondary | ICD-10-CM

## 2022-03-06 DIAGNOSIS — Z800 Family history of malignant neoplasm of digestive organs: Secondary | ICD-10-CM

## 2022-03-06 DIAGNOSIS — M7989 Other specified soft tissue disorders: Secondary | ICD-10-CM | POA: Diagnosis not present

## 2022-03-06 DIAGNOSIS — Y838 Other surgical procedures as the cause of abnormal reaction of the patient, or of later complication, without mention of misadventure at the time of the procedure: Secondary | ICD-10-CM | POA: Diagnosis present

## 2022-03-06 DIAGNOSIS — F32A Depression, unspecified: Secondary | ICD-10-CM | POA: Diagnosis present

## 2022-03-06 DIAGNOSIS — Z7982 Long term (current) use of aspirin: Secondary | ICD-10-CM

## 2022-03-06 DIAGNOSIS — Z66 Do not resuscitate: Secondary | ICD-10-CM | POA: Diagnosis present

## 2022-03-06 DIAGNOSIS — M79671 Pain in right foot: Secondary | ICD-10-CM | POA: Diagnosis not present

## 2022-03-06 DIAGNOSIS — K219 Gastro-esophageal reflux disease without esophagitis: Secondary | ICD-10-CM | POA: Diagnosis present

## 2022-03-06 DIAGNOSIS — E785 Hyperlipidemia, unspecified: Secondary | ICD-10-CM | POA: Diagnosis present

## 2022-03-06 DIAGNOSIS — L03115 Cellulitis of right lower limb: Secondary | ICD-10-CM | POA: Diagnosis not present

## 2022-03-06 DIAGNOSIS — F418 Other specified anxiety disorders: Secondary | ICD-10-CM | POA: Diagnosis not present

## 2022-03-06 DIAGNOSIS — G4700 Insomnia, unspecified: Secondary | ICD-10-CM | POA: Diagnosis present

## 2022-03-06 DIAGNOSIS — Z801 Family history of malignant neoplasm of trachea, bronchus and lung: Secondary | ICD-10-CM | POA: Diagnosis not present

## 2022-03-06 DIAGNOSIS — Z833 Family history of diabetes mellitus: Secondary | ICD-10-CM | POA: Diagnosis not present

## 2022-03-06 MED ORDER — OXYCODONE-ACETAMINOPHEN 5-325 MG PO TABS
1.0000 | ORAL_TABLET | Freq: Once | ORAL | Status: AC
  Administered 2022-03-06: 1 via ORAL
  Filled 2022-03-06: qty 1

## 2022-03-06 NOTE — ED Provider Triage Note (Signed)
Emergency Medicine Provider Triage Evaluation Note  Lauren [name] , a 76 y.o. female  was evaluated in triage.  Pt complains of right foot pain. Had bunion surgery to the 5th metatarsal. States that since today has had significantly worsening pain. Has redness to the foot that she is concerned about. Denies fevers, drainage.   Review of Systems  Positive: See above Negative:   Physical Exam  BP (!) 158/70   Pulse 76   Temp 98.2 F (36.8 C) (Oral)   Resp 16   Ht '5\' 4"'$  (1.626 m)   Wt 64 kg   LMP 05/22/1996   SpO2 97%   BMI 24.20 kg/m  Gen:   Awake, no distress   Resp:  Normal effort  MSK:   Moves extremities without difficulty  Other:  Right foot with redness to the lateral aspect of the foot. Incision site looks well healing  Medical Decision Making  Medically screening exam initiated at 7:48 PM.  Appropriate orders placed.  Lauren [name] was informed that the remainder of the evaluation will be completed by another provider, this initial triage assessment does not replace that evaluation, and the importance of remaining in the ED until their evaluation is complete.     Mickie Hillier, PA-C 03/06/22 1949

## 2022-03-06 NOTE — ED Triage Notes (Signed)
Pt reports right foot surgery on 9/26 and states her foot is red and swollen. Denies fever/chills. Small skin tear to pad of foot but no purulent drainage or open wounds noted.

## 2022-03-06 NOTE — Telephone Encounter (Signed)
Spoke to patient on the telephone via after-hours answering service.  She called to inform me that she is going to the emergency department at [facility] for evaluation due to worsening redness with swelling and likely infection of the surgical site to the foot.  If patient is admitted for worsening infection we will plan to round on her tomorrow, 03/07/2022.  Once admitted please consult podiatry/podiatrist on-call.   Edrick Kins, DPM [facility]  Dr. Edrick Kins, DPM    [address], [place] [address]                Office [phone]  Fax [phone]

## 2022-03-07 ENCOUNTER — Emergency Department (HOSPITAL_COMMUNITY): Payer: Medicare Other

## 2022-03-07 ENCOUNTER — Telehealth: Payer: Self-pay | Admitting: Podiatry

## 2022-03-07 ENCOUNTER — Encounter (HOSPITAL_COMMUNITY): Payer: Self-pay | Admitting: Family Medicine

## 2022-03-07 ENCOUNTER — Inpatient Hospital Stay (HOSPITAL_COMMUNITY): Payer: Medicare Other | Admitting: Anesthesiology

## 2022-03-07 ENCOUNTER — Other Ambulatory Visit: Payer: Self-pay

## 2022-03-07 ENCOUNTER — Encounter (HOSPITAL_COMMUNITY)
Admission: EM | Disposition: A | Discharge status: Home / Self Care | Payer: Self-pay | Source: Home / Self Care | Attending: Internal Medicine

## 2022-03-07 ENCOUNTER — Inpatient Hospital Stay (HOSPITAL_COMMUNITY): Payer: Medicare Other

## 2022-03-07 DIAGNOSIS — L03115 Cellulitis of right lower limb: Secondary | ICD-10-CM | POA: Diagnosis present

## 2022-03-07 DIAGNOSIS — E039 Hypothyroidism, unspecified: Secondary | ICD-10-CM | POA: Diagnosis not present

## 2022-03-07 DIAGNOSIS — M86171 Other acute osteomyelitis, right ankle and foot: Secondary | ICD-10-CM | POA: Diagnosis present

## 2022-03-07 DIAGNOSIS — E861 Hypovolemia: Secondary | ICD-10-CM | POA: Diagnosis present

## 2022-03-07 DIAGNOSIS — Z808 Family history of malignant neoplasm of other organs or systems: Secondary | ICD-10-CM | POA: Diagnosis not present

## 2022-03-07 DIAGNOSIS — Z90710 Acquired absence of both cervix and uterus: Secondary | ICD-10-CM | POA: Diagnosis not present

## 2022-03-07 DIAGNOSIS — M86671 Other chronic osteomyelitis, right ankle and foot: Secondary | ICD-10-CM | POA: Diagnosis not present

## 2022-03-07 DIAGNOSIS — Z801 Family history of malignant neoplasm of trachea, bronchus and lung: Secondary | ICD-10-CM | POA: Diagnosis not present

## 2022-03-07 DIAGNOSIS — M869 Osteomyelitis, unspecified: Secondary | ICD-10-CM | POA: Diagnosis not present

## 2022-03-07 DIAGNOSIS — M60073 Infective myositis, right foot: Secondary | ICD-10-CM | POA: Diagnosis present

## 2022-03-07 DIAGNOSIS — F419 Anxiety disorder, unspecified: Secondary | ICD-10-CM | POA: Diagnosis present

## 2022-03-07 DIAGNOSIS — I6529 Occlusion and stenosis of unspecified carotid artery: Secondary | ICD-10-CM | POA: Diagnosis present

## 2022-03-07 DIAGNOSIS — I10 Essential (primary) hypertension: Secondary | ICD-10-CM

## 2022-03-07 DIAGNOSIS — Z833 Family history of diabetes mellitus: Secondary | ICD-10-CM | POA: Diagnosis not present

## 2022-03-07 DIAGNOSIS — F32A Depression, unspecified: Secondary | ICD-10-CM | POA: Diagnosis present

## 2022-03-07 DIAGNOSIS — E876 Hypokalemia: Secondary | ICD-10-CM | POA: Diagnosis present

## 2022-03-07 DIAGNOSIS — F418 Other specified anxiety disorders: Secondary | ICD-10-CM

## 2022-03-07 DIAGNOSIS — Z800 Family history of malignant neoplasm of digestive organs: Secondary | ICD-10-CM | POA: Diagnosis not present

## 2022-03-07 DIAGNOSIS — G4700 Insomnia, unspecified: Secondary | ICD-10-CM | POA: Diagnosis present

## 2022-03-07 DIAGNOSIS — Z66 Do not resuscitate: Secondary | ICD-10-CM | POA: Diagnosis present

## 2022-03-07 DIAGNOSIS — L02611 Cutaneous abscess of right foot: Secondary | ICD-10-CM | POA: Diagnosis present

## 2022-03-07 DIAGNOSIS — Y838 Other surgical procedures as the cause of abnormal reaction of the patient, or of later complication, without mention of misadventure at the time of the procedure: Secondary | ICD-10-CM | POA: Diagnosis present

## 2022-03-07 DIAGNOSIS — Z8049 Family history of malignant neoplasm of other genital organs: Secondary | ICD-10-CM | POA: Diagnosis not present

## 2022-03-07 DIAGNOSIS — T8143XA Infection following a procedure, organ and space surgical site, initial encounter: Secondary | ICD-10-CM | POA: Diagnosis present

## 2022-03-07 DIAGNOSIS — T502X5A Adverse effect of carbonic-anhydrase inhibitors, benzothiadiazides and other diuretics, initial encounter: Secondary | ICD-10-CM | POA: Diagnosis present

## 2022-03-07 DIAGNOSIS — Z79899 Other long term (current) drug therapy: Secondary | ICD-10-CM | POA: Diagnosis not present

## 2022-03-07 DIAGNOSIS — Z8249 Family history of ischemic heart disease and other diseases of the circulatory system: Secondary | ICD-10-CM | POA: Diagnosis not present

## 2022-03-07 DIAGNOSIS — K219 Gastro-esophageal reflux disease without esophagitis: Secondary | ICD-10-CM | POA: Diagnosis present

## 2022-03-07 HISTORY — PX: I & D EXTREMITY: SHX5045

## 2022-03-07 LAB — COMPREHENSIVE METABOLIC PANEL
ALT: 16 U/L (ref 0–44)
AST: 18 U/L (ref 15–41)
Albumin: 4.2 g/dL (ref 3.5–5.0)
Alkaline Phosphatase: 78 U/L (ref 38–126)
Anion gap: 12 (ref 5–15)
BUN: 12 mg/dL (ref 8–23)
CO2: 26 mmol/L (ref 22–32)
Calcium: 9.8 mg/dL (ref 8.9–10.3)
Chloride: 104 mmol/L (ref 98–111)
Creatinine, Ser: 0.67 mg/dL (ref 0.44–1.00)
GFR, Estimated: >60 mL/min (ref >60)
Glucose, Bld: 88 mg/dL (ref 70–99)
Potassium: 3.1 mmol/L — ABNORMAL LOW (ref 3.5–5.1)
Sodium: 142 mmol/L (ref 135–145)
Total Bilirubin: 1 mg/dL (ref 0.3–1.2)
Total Protein: 7.3 g/dL (ref 6.5–8.1)

## 2022-03-07 LAB — CBC WITH DIFFERENTIAL/PLATELET
Abs Immature Granulocytes: 0.03 10*3/uL (ref 0.00–0.07)
Basophils Absolute: 0 10*3/uL (ref 0.0–0.1)
Basophils Relative: 1 %
Eosinophils Absolute: 0.1 10*3/uL (ref 0.0–0.5)
Eosinophils Relative: 1 %
HCT: 40.2 % (ref 36.0–46.0)
Hemoglobin: 13.5 g/dL (ref 12.0–15.0)
Immature Granulocytes: 0 %
Lymphocytes Relative: 24 %
Lymphs Abs: 1.7 10*3/uL (ref 0.7–4.0)
MCH: 32 pg (ref 26.0–34.0)
MCHC: 33.6 g/dL (ref 30.0–36.0)
MCV: 95.3 fL (ref 80.0–100.0)
Monocytes Absolute: 0.5 10*3/uL (ref 0.1–1.0)
Monocytes Relative: 7 %
Neutro Abs: 4.8 10*3/uL (ref 1.7–7.7)
Neutrophils Relative %: 67 %
Platelets: 246 10*3/uL (ref 150–400)
RBC: 4.22 MIL/uL (ref 3.87–5.11)
RDW: 13.5 % (ref 11.5–15.5)
WBC: 7.2 10*3/uL (ref 4.0–10.5)
nRBC: 0 % (ref 0.0–0.2)

## 2022-03-07 LAB — C-REACTIVE PROTEIN: CRP: 0.6 mg/dL (ref <1.0)

## 2022-03-07 LAB — TYPE AND SCREEN
ABO/RH(D): O POS
Antibody Screen: NEGATIVE

## 2022-03-07 LAB — SEDIMENTATION RATE: Sed Rate: 6 mm/hr (ref 0–22)

## 2022-03-07 LAB — MAGNESIUM: Magnesium: 2.2 mg/dL (ref 1.7–2.4)

## 2022-03-07 SURGERY — IRRIGATION AND DEBRIDEMENT EXTREMITY
Anesthesia: Monitor Anesthesia Care | Laterality: Right

## 2022-03-07 MED ORDER — LACTATED RINGERS IV SOLN: INTRAVENOUS | Status: DC

## 2022-03-07 MED ORDER — CHLORHEXIDINE GLUCONATE 0.12 % MT SOLN: 15.0000 mL | Freq: Once | OROMUCOSAL | Status: AC

## 2022-03-07 MED ORDER — LIDOCAINE HCL (PF) 1 % IJ SOLN
INTRAMUSCULAR | Status: AC
  Filled 2022-03-07: qty 30

## 2022-03-07 MED ORDER — ACETAMINOPHEN 325 MG PO TABS: 650.0000 mg | ORAL_TABLET | Freq: Four times a day (QID) | ORAL | Status: DC | PRN

## 2022-03-07 MED ORDER — CEFAZOLIN SODIUM 1 G IJ SOLR
INTRAMUSCULAR | Status: AC
  Filled 2022-03-07: qty 20

## 2022-03-07 MED ORDER — HYDROCHLOROTHIAZIDE 12.5 MG PO CAPS
12.5000 mg | ORAL_CAPSULE | Freq: Every day | ORAL | Status: DC
  Filled 2022-03-07: qty 1

## 2022-03-07 MED ORDER — FENTANYL CITRATE (PF) 100 MCG/2ML IJ SOLN: 25.0000 ug | INTRAMUSCULAR | Status: DC | PRN

## 2022-03-07 MED ORDER — ALPRAZOLAM 0.5 MG PO TABS: 0.5000 mg | ORAL_TABLET | Freq: Two times a day (BID) | ORAL | Status: DC | PRN

## 2022-03-07 MED ORDER — PHENYLEPHRINE 80 MCG/ML (10ML) SYRINGE FOR IV PUSH (FOR BLOOD PRESSURE SUPPORT)
PREFILLED_SYRINGE | INTRAVENOUS | Status: DC | PRN
  Administered 2022-03-07 (×2): 80 ug via INTRAVENOUS

## 2022-03-07 MED ORDER — MORPHINE SULFATE (PF) 2 MG/ML IV SOLN
1.0000 mg | INTRAVENOUS | Status: DC | PRN
  Administered 2022-03-07 – 2022-03-08 (×5): 1 mg via INTRAVENOUS
  Filled 2022-03-07 (×7): qty 1

## 2022-03-07 MED ORDER — AMLODIPINE BESYLATE 5 MG PO TABS
5.0000 mg | ORAL_TABLET | Freq: Every day | ORAL | Status: DC
  Administered 2022-03-08 – 2022-03-09 (×2): 5 mg via ORAL
  Filled 2022-03-07 (×2): qty 1

## 2022-03-07 MED ORDER — PHENYLEPHRINE 80 MCG/ML (10ML) SYRINGE FOR IV PUSH (FOR BLOOD PRESSURE SUPPORT)
PREFILLED_SYRINGE | INTRAVENOUS | Status: AC
  Filled 2022-03-07: qty 10

## 2022-03-07 MED ORDER — LIDOCAINE HCL 2 % IJ SOLN
INTRAMUSCULAR | Status: DC | PRN
  Administered 2022-03-07 (×2): 5 mL

## 2022-03-07 MED ORDER — ONDANSETRON HCL 4 MG/2ML IJ SOLN: 4.0000 mg | Freq: Once | INTRAMUSCULAR | Status: DC | PRN

## 2022-03-07 MED ORDER — AMISULPRIDE (ANTIEMETIC) 5 MG/2ML IV SOLN: 10.0000 mg | Freq: Once | INTRAVENOUS | Status: DC | PRN

## 2022-03-07 MED ORDER — ONDANSETRON HCL 4 MG PO TABS: 4.0000 mg | ORAL_TABLET | Freq: Four times a day (QID) | ORAL | Status: DC | PRN

## 2022-03-07 MED ORDER — CHLORHEXIDINE GLUCONATE CLOTH 2 % EX PADS: 6.0000 | MEDICATED_PAD | Freq: Once | CUTANEOUS | Status: DC

## 2022-03-07 MED ORDER — ACETAMINOPHEN 10 MG/ML IV SOLN: 1000.0000 mg | Freq: Once | INTRAVENOUS | Status: DC | PRN

## 2022-03-07 MED ORDER — LEVOTHYROXINE SODIUM 50 MCG PO TABS
50.0000 ug | ORAL_TABLET | Freq: Every day | ORAL | Status: DC
  Administered 2022-03-08 – 2022-03-09 (×2): 50 ug via ORAL
  Filled 2022-03-07 (×2): qty 1

## 2022-03-07 MED ORDER — BUPIVACAINE HCL (PF) 0.5 % IJ SOLN
INTRAMUSCULAR | Status: AC
  Filled 2022-03-07: qty 30

## 2022-03-07 MED ORDER — OXYCODONE-ACETAMINOPHEN 5-325 MG PO TABS
1.0000 | ORAL_TABLET | Freq: Once | ORAL | Status: AC
  Administered 2022-03-07: 1 via ORAL
  Filled 2022-03-07: qty 1

## 2022-03-07 MED ORDER — BUPIVACAINE HCL (PF) 0.5 % IJ SOLN
INTRAMUSCULAR | Status: DC | PRN
  Administered 2022-03-07 (×2): 5 mL

## 2022-03-07 MED ORDER — SODIUM CHLORIDE 0.9 % IV SOLN
2.0000 g | Freq: Two times a day (BID) | INTRAVENOUS | Status: DC
  Administered 2022-03-07 – 2022-03-08 (×2): 2 g via INTRAVENOUS
  Filled 2022-03-07 (×2): qty 12.5

## 2022-03-07 MED ORDER — PROPOFOL 10 MG/ML IV BOLUS
INTRAVENOUS | Status: DC | PRN
  Administered 2022-03-07: 40 mg via INTRAVENOUS

## 2022-03-07 MED ORDER — ORAL CARE MOUTH RINSE: 15.0000 mL | Freq: Once | OROMUCOSAL | Status: AC

## 2022-03-07 MED ORDER — FENTANYL CITRATE (PF) 250 MCG/5ML IJ SOLN
INTRAMUSCULAR | Status: AC
  Filled 2022-03-07: qty 5

## 2022-03-07 MED ORDER — CHLORHEXIDINE GLUCONATE 0.12 % MT SOLN
OROMUCOSAL | Status: AC
  Filled 2022-03-07: qty 15

## 2022-03-07 MED ORDER — POTASSIUM CHLORIDE 10 MEQ/100ML IV SOLN
10.0000 meq | INTRAVENOUS | Status: DC
  Filled 2022-03-07 (×2): qty 100

## 2022-03-07 MED ORDER — ACETAMINOPHEN 650 MG RE SUPP: 650.0000 mg | Freq: Four times a day (QID) | RECTAL | Status: DC | PRN

## 2022-03-07 MED ORDER — LIDOCAINE 2% (20 MG/ML) 5 ML SYRINGE
INTRAMUSCULAR | Status: DC | PRN
  Administered 2022-03-07: 40 mg via INTRAVENOUS

## 2022-03-07 MED ORDER — FENTANYL CITRATE (PF) 250 MCG/5ML IJ SOLN
INTRAMUSCULAR | Status: DC | PRN
  Administered 2022-03-07 (×3): 25 ug via INTRAVENOUS

## 2022-03-07 MED ORDER — VANCOMYCIN HCL IN DEXTROSE 1-5 GM/200ML-% IV SOLN
1000.0000 mg | INTRAVENOUS | Status: DC
  Administered 2022-03-07: 1000 mg via INTRAVENOUS
  Filled 2022-03-07 (×2): qty 200

## 2022-03-07 MED ORDER — PROPOFOL 500 MG/50ML IV EMUL
INTRAVENOUS | Status: DC | PRN
  Administered 2022-03-07: 60 ug/kg/min via INTRAVENOUS

## 2022-03-07 MED ORDER — TEMAZEPAM 15 MG PO CAPS
15.0000 mg | ORAL_CAPSULE | Freq: Every evening | ORAL | Status: DC | PRN
  Administered 2022-03-07: 15 mg via ORAL
  Filled 2022-03-07: qty 1

## 2022-03-07 MED ORDER — CEFAZOLIN SODIUM-DEXTROSE 2-3 GM-%(50ML) IV SOLR
INTRAVENOUS | Status: DC | PRN
  Administered 2022-03-07: 2 g via INTRAVENOUS

## 2022-03-07 MED ORDER — SODIUM CHLORIDE 0.9 % IR SOLN
Status: DC | PRN
  Administered 2022-03-07: 1000 mL

## 2022-03-07 MED ORDER — LIDOCAINE 2% (20 MG/ML) 5 ML SYRINGE
INTRAMUSCULAR | Status: AC
  Filled 2022-03-07: qty 5

## 2022-03-07 MED ORDER — CHLORHEXIDINE GLUCONATE 0.12 % MT SOLN
OROMUCOSAL | Status: AC
  Administered 2022-03-07: 15 mL via OROMUCOSAL
  Filled 2022-03-07: qty 15

## 2022-03-07 MED ORDER — FAMOTIDINE 20 MG PO TABS
40.0000 mg | ORAL_TABLET | Freq: Every day | ORAL | Status: DC
  Administered 2022-03-07 – 2022-03-09 (×3): 40 mg via ORAL
  Filled 2022-03-07 (×3): qty 2

## 2022-03-07 MED ORDER — CYCLOSPORINE 0.05 % OP EMUL
1.0000 [drp] | Freq: Two times a day (BID) | OPHTHALMIC | Status: DC
  Administered 2022-03-08 – 2022-03-09 (×3): 1 [drp] via OPHTHALMIC
  Filled 2022-03-07 (×5): qty 30

## 2022-03-07 MED ORDER — POTASSIUM CHLORIDE 10 MEQ/100ML IV SOLN
10.0000 meq | INTRAVENOUS | Status: AC
  Administered 2022-03-07 (×2): 10 meq via INTRAVENOUS
  Filled 2022-03-07: qty 100

## 2022-03-07 MED ORDER — ONDANSETRON HCL 4 MG/2ML IJ SOLN: 4.0000 mg | Freq: Four times a day (QID) | INTRAMUSCULAR | Status: DC | PRN

## 2022-03-07 SURGICAL SUPPLY — 57 items
APL PRP STRL LF DISP 70% ISPRP (MISCELLANEOUS) ×1
BAG COUNTER SPONGE SURGICOUNT (BAG) ×1 IMPLANT
BAG SPNG CNTER NS LX DISP (BAG) ×1
BLADE SAW SGTL 83.5X18.5 (BLADE) ×1 IMPLANT
BNDG CMPR 75X41 PLY ABS (GAUZE/BANDAGES/DRESSINGS) ×1
BNDG CMPR 9X4 STRL LF SNTH (GAUZE/BANDAGES/DRESSINGS)
BNDG COHESIVE 6X5 TAN STRL LF (GAUZE/BANDAGES/DRESSINGS) IMPLANT
BNDG ELASTIC 3X5.8 VLCR STR LF (GAUZE/BANDAGES/DRESSINGS) IMPLANT
BNDG ELASTIC 4X5.8 VLCR STR LF (GAUZE/BANDAGES/DRESSINGS) IMPLANT
BNDG ESMARK 4X9 LF (GAUZE/BANDAGES/DRESSINGS) ×1 IMPLANT
BNDG GAUZE DERMACEA FLUFF 4 (GAUZE/BANDAGES/DRESSINGS) ×2 IMPLANT
BNDG GZE DERMACEA 4 6PLY (GAUZE/BANDAGES/DRESSINGS) ×2
BNDG STRETCH 4X75 NS LF (GAUZE/BANDAGES/DRESSINGS) IMPLANT
BOWL SMART MIX CTS (DISPOSABLE) IMPLANT
CHLORAPREP W/TINT 26 (MISCELLANEOUS) ×1 IMPLANT
COVER SURGICAL LIGHT HANDLE (MISCELLANEOUS) ×1 IMPLANT
CUFF TOURN SGL QUICK 18X4 (TOURNIQUET CUFF) ×1 IMPLANT
CUFF TOURN SGL QUICK 34 (TOURNIQUET CUFF)
CUFF TRNQT CYL 34X4.125X (TOURNIQUET CUFF) ×1 IMPLANT
DRAPE U-SHAPE 47X51 STRL (DRAPES) ×1 IMPLANT
DRSG ADAPTIC 3X8 NADH LF (GAUZE/BANDAGES/DRESSINGS) IMPLANT
ELECT CAUTERY BLADE 6.4 (BLADE) ×1 IMPLANT
ELECT REM PT RETURN 9FT ADLT (ELECTROSURGICAL)
ELECTRODE REM PT RTRN 9FT ADLT (ELECTROSURGICAL) IMPLANT
GAUZE PAD ABD 8X10 STRL (GAUZE/BANDAGES/DRESSINGS) ×1 IMPLANT
GAUZE SPONGE 4X4 12PLY STRL (GAUZE/BANDAGES/DRESSINGS) ×1 IMPLANT
GAUZE XEROFORM 1X8 LF (GAUZE/BANDAGES/DRESSINGS) ×1 IMPLANT
GLOVE BIO SURGEON STRL SZ8 (GLOVE) ×1 IMPLANT
GLOVE BIOGEL PI IND STRL 8 (GLOVE) ×1 IMPLANT
GOWN STRL REUS W/ TWL LRG LVL3 (GOWN DISPOSABLE) ×2 IMPLANT
GOWN STRL REUS W/TWL LRG LVL3 (GOWN DISPOSABLE) ×2
HANDPIECE INTERPULSE COAX TIP (DISPOSABLE)
KIT BASIN OR (CUSTOM PROCEDURE TRAY) ×1 IMPLANT
KIT TURNOVER KIT B (KITS) ×1 IMPLANT
MANIFOLD NEPTUNE II (INSTRUMENTS) ×1 IMPLANT
NDL HYPO 25GX1X1/2 BEV (NEEDLE) ×1 IMPLANT
NEEDLE HYPO 25GX1X1/2 BEV (NEEDLE) ×1 IMPLANT
NS IRRIG 1000ML POUR BTL (IV SOLUTION) ×1 IMPLANT
PACK ORTHO EXTREMITY (CUSTOM PROCEDURE TRAY) ×1 IMPLANT
PAD ABD 8X10 STRL (GAUZE/BANDAGES/DRESSINGS) IMPLANT
PAD ARMBOARD 7.5X6 YLW CONV (MISCELLANEOUS) ×2 IMPLANT
PAD CAST 4YDX4 CTTN HI CHSV (CAST SUPPLIES) ×1 IMPLANT
PADDING CAST COTTON 4X4 STRL (CAST SUPPLIES)
PADDING CAST COTTON 6X4 STRL (CAST SUPPLIES) ×1 IMPLANT
SET HNDPC FAN SPRY TIP SCT (DISPOSABLE) IMPLANT
SOL PREP POV-IOD 4OZ 10% (MISCELLANEOUS) ×2 IMPLANT
STAPLER VISISTAT 35W (STAPLE) ×1 IMPLANT
STOCKINETTE IMPERVIOUS 9X36 MD (GAUZE/BANDAGES/DRESSINGS) ×1 IMPLANT
SUT MON AB 4-0 PS1 27 (SUTURE) IMPLANT
SUT PROLENE 3 0 PS 2 (SUTURE) ×1 IMPLANT
SWAB CULTURE LIQ STUART DBL (MISCELLANEOUS) ×1 IMPLANT
SWAB CULTURE LIQUID MINI MALE (MISCELLANEOUS) ×1 IMPLANT
SYR CONTROL 10ML LL (SYRINGE) ×1 IMPLANT
TOWEL GREEN STERILE (TOWEL DISPOSABLE) ×1 IMPLANT
TOWEL GREEN STERILE FF (TOWEL DISPOSABLE) ×1 IMPLANT
TUBE CONNECTING 12X1/4 (SUCTIONS) ×1 IMPLANT
YANKAUER SUCT BULB TIP NO VENT (SUCTIONS) ×1 IMPLANT

## 2022-03-07 NOTE — Consult Note (Signed)
Reason for Consult:Right foot infection, osteomyelitis  Referring Physician: Carlisle Cater, PA-C  Lauren [name] is an 76 y.o. female.  HPI: 76 year old female presented to the emergency room with concerns for worsening right foot pain, swelling or redness.  She recently underwent fifth metatarsal head resection with Dr. Paulla Dolly on February 10, 2022.  She was last seen in the office on February 28, 2022.  At time and she was started on doxycycline.  She states that the symptoms of worsening show therefore she presented to the emergency room for further evaluation.  Past Medical History:  Diagnosis Date   Anxiety    Arthritis    Carotid stenosis 10/08/2013   Cervical disc disease    Depression    Diverticulitis    Diverticulosis    Frequent headaches    Hypertension    does not now per pt.   Hypothyroidism 10/26/2015   Impaired glucose tolerance 10/08/2013   normal HgA1C   Neck pain    Psoriasis     Past Surgical History:  Procedure Laterality Date   BREAST BIOPSY Left 2011   benign    CERVICAL FUSION     approx 1980   CHOLECYSTECTOMY     COLONOSCOPY     POSTERIOR CERVICAL FUSION/FORAMINOTOMY N/A 11/05/2014   Procedure: Cervical four to Cervical seven  Posterior Cervical Fusion with lateral mass fixation;  Surgeon: Eustace Moore, MD;  Location: [place];  Service: Neurosurgery;  Laterality: N/A;   C4 - C7 Posterior Cervical Fusion with lateral mass fixation   TOTAL ABDOMINAL HYSTERECTOMY  1998   Fibroid   UPPER GASTROINTESTINAL ENDOSCOPY      Family History  Problem Relation Age of Onset   Heart disease Mother    Diabetes Mother    Cervical cancer Mother    Heart attack Mother    Lung cancer Father    Alcoholism Father    Colon cancer Father    Diabetes Brother    Throat cancer Brother    Cancer Other        lung cancer   Heart disease Other    Hypertension Other    Arthritis Other    Alcohol abuse Other    Rectal cancer Neg Hx    Stomach cancer Neg Hx      Social History:  reports that she has never smoked. She has never used smokeless tobacco. She reports current alcohol use. She reports that she does not use drugs.  Allergies:  Allergies  Allergen Reactions   Celebrex [Celecoxib] Itching and Rash    Medications: I have reviewed the patient's current medications.  No results found for this or any previous visit (from the past 48 hour(s)).  MR FOOT RIGHT WO CONTRAST  Result Date: 03/07/2022 CLINICAL DATA:  Pain, swelling and redness involving the fifth metatarsal. Recent amputation of the fifth metatarsal head. EXAM: MRI OF THE RIGHT FOREFOOT WITHOUT CONTRAST TECHNIQUE: Multiplanar, multisequence MR imaging of the right foot was performed. No intravenous contrast was administered. COMPARISON:  Radiographs 03/06/2022 FINDINGS: Abnormal T1 and T2 signal intensity involving the amputation site. There is surrounding soft tissue swelling/edema/fluid. Suspect small abscess along the plantar aspect. Findings are highly suspicious for osteomyelitis. There is also a small focus of signal abnormality/marrow edema involving the proximal phalanx of the fifth toe which could be an early site of osteomyelitis. The other bony structures are intact. There is diffuse cellulitis and myofasciitis without definite findings for pyomyositis. IMPRESSION: 1. MR findings consistent with osteomyelitis  involving the fifth metatarsal resection site. Surrounding fluid collections worrisome for small abscesses. 2. Suspect small focus of early osteomyelitis involving the proximal phalanx of the fifth toe also. 3. Diffuse cellulitis and myofasciitis. These results will be called to the ordering clinician or representative by the Radiologist Assistant, and communication documented in the PACS or [employer]. Electronically Signed   By: Marijo Sanes M.D.   On: 03/07/2022 08:13   DG Foot Complete Right  Result Date: 03/06/2022 CLINICAL DATA:  Postoperative pain and  redness of fifth metatarsal. EXAM: RIGHT FOOT COMPLETE - 3+ VIEW COMPARISON:  Right foot radiographs 03/02/2022 FINDINGS: Redemonstration of amputation of the distal shaft and head of fifth metatarsal. There is moderate regional soft tissue swelling, similar to 03/02/2022. The amputation site appears sharp. Slightly decreased bone mineralization compared to 03/02/2022 areas secondary to postoperative edema. Mild second through fifth interphalangeal joint space narrowing. No acute fracture dislocation. IMPRESSION: Status post amputation of the distal shaft and head of the fifth metatarsal with moderate regional soft tissue swelling, similar to 03/02/2022. Slightly decreased bone mineralization at the distal amputation site, however no definitive cortical erosion is seen at this time. Please note that recent postoperative state and radiograph modality limit evaluation for acute osteomyelitis. If there is persistent clinical concern for acute osteomyelitis, note is made that MRI would be more sensitive/specific. Electronically Signed   By: Yvonne Kendall M.D.   On: 03/06/2022 21:00   DG Foot 2 Views Right  Result Date: 03/06/2022 Please see detailed radiograph report in office note.   Review of Systems Blood pressure (!) 174/74, pulse 77, temperature 97.7 F (36.5 C), temperature source Oral, resp. rate 18, height '5\' 4"'$  (1.626 m), weight 64 kg, last menstrual period 05/22/1996, SpO2 98 %. Physical Exam General: AAO x3, NAD  Dermatological: Incision coapted without any dehiscence.  There is edema noted along the MPJ as well as the toe of the right fifth.  There is mild erythema.  No fluctuation or crepitation.       Vascular: Dorsalis Pedis artery and Posterior Tibial artery pedal pulses are 2/4 bilateral with immedate capillary fill time. There is no pain with calf compression, swelling, warmth, erythema.   Neruologic: Grossly intact via light touch bilateral.   Musculoskeletal: Tenderness to the  right fifth MPJ  Assessment/Plan: Status post right foot surgery with concern for abscess, osteomyelitis  I reviewed the MRI with the patient.  Given her symptoms as well as MRI findings discussed with her I&D, bone biopsy.  I discussed the procedure as well as postoperative course.  We discussed the risks including spread of infection, bleeding, scarring, delayed healing.  We also did discuss the possibility amputation of the toe if the bone is infected.  Patient was upset by this.  She does agree to proceed with surgery later today.  I called to schedule her with the OR.  Continue NPO.  Order to hold antibiotics until after biopsy, cultures.  Admit to hospitalist.  Trula Slade 03/07/2022, 12:55 PM

## 2022-03-07 NOTE — Assessment & Plan Note (Addendum)
Monitor on telemetry overnight  Likely secondary to hctz  Check magnesium Replete and trend

## 2022-03-07 NOTE — H&P (View-Only) (Signed)
Reason for Consult:Right foot infection, osteomyelitis  Referring Physician: Carlisle Cater, PA-C  Lauren [name] is an 76 y.o. female.  HPI: 76 year old female presented to the emergency room with concerns for worsening right foot pain, swelling or redness.  She recently underwent fifth metatarsal head resection with Dr. Paulla Dolly on February 10, 2022.  She was last seen in the office on February 28, 2022.  At time and she was started on doxycycline.  She states that the symptoms of worsening show therefore she presented to the emergency room for further evaluation.  Past Medical History:  Diagnosis Date   Anxiety    Arthritis    Carotid stenosis 10/08/2013   Cervical disc disease    Depression    Diverticulitis    Diverticulosis    Frequent headaches    Hypertension    does not now per pt.   Hypothyroidism 10/26/2015   Impaired glucose tolerance 10/08/2013   normal HgA1C   Neck pain    Psoriasis     Past Surgical History:  Procedure Laterality Date   BREAST BIOPSY Left 2011   benign    CERVICAL FUSION     approx 1980   CHOLECYSTECTOMY     COLONOSCOPY     POSTERIOR CERVICAL FUSION/FORAMINOTOMY N/A 11/05/2014   Procedure: Cervical four to Cervical seven  Posterior Cervical Fusion with lateral mass fixation;  Surgeon: Eustace Moore, MD;  Location: [place] [facility];  Service: Neurosurgery;  Laterality: N/A;   C4 - C7 Posterior Cervical Fusion with lateral mass fixation   TOTAL ABDOMINAL HYSTERECTOMY  1998   Fibroid   UPPER GASTROINTESTINAL ENDOSCOPY      Family History  Problem Relation Age of Onset   Heart disease Mother    Diabetes Mother    Cervical cancer Mother    Heart attack Mother    Lung cancer Father    Alcoholism Father    Colon cancer Father    Diabetes Brother    Throat cancer Brother    Cancer Other        lung cancer   Heart disease Other    Hypertension Other    Arthritis Other    Alcohol abuse Other    Rectal cancer Neg Hx    Stomach cancer Neg Hx      Social History:  reports that she has never smoked. She has never used smokeless tobacco. She reports current alcohol use. She reports that she does not use drugs.  Allergies:  Allergies  Allergen Reactions   Celebrex [Celecoxib] Itching and Rash    Medications: I have reviewed the patient's current medications.  No results found for this or any previous visit (from the past 48 hour(s)).  MR FOOT RIGHT WO CONTRAST  Result Date: 03/07/2022 CLINICAL DATA:  Pain, swelling and redness involving the fifth metatarsal. Recent amputation of the fifth metatarsal head. EXAM: MRI OF THE RIGHT FOREFOOT WITHOUT CONTRAST TECHNIQUE: Multiplanar, multisequence MR imaging of the right foot was performed. No intravenous contrast was administered. COMPARISON:  Radiographs 03/06/2022 FINDINGS: Abnormal T1 and T2 signal intensity involving the amputation site. There is surrounding soft tissue swelling/edema/fluid. Suspect small abscess along the plantar aspect. Findings are highly suspicious for osteomyelitis. There is also a small focus of signal abnormality/marrow edema involving the proximal phalanx of the fifth toe which could be an early site of osteomyelitis. The other bony structures are intact. There is diffuse cellulitis and myofasciitis without definite findings for pyomyositis. IMPRESSION: 1. MR findings consistent with osteomyelitis  involving the fifth metatarsal resection site. Surrounding fluid collections worrisome for small abscesses. 2. Suspect small focus of early osteomyelitis involving the proximal phalanx of the fifth toe also. 3. Diffuse cellulitis and myofasciitis. These results will be called to the ordering clinician or representative by the Radiologist Assistant, and communication documented in the PACS or [employer]. Electronically Signed   By: Marijo Sanes M.D.   On: 03/07/2022 08:13   DG Foot Complete Right  Result Date: 03/06/2022 CLINICAL DATA:  Postoperative pain and  redness of fifth metatarsal. EXAM: RIGHT FOOT COMPLETE - 3+ VIEW COMPARISON:  Right foot radiographs 03/02/2022 FINDINGS: Redemonstration of amputation of the distal shaft and head of fifth metatarsal. There is moderate regional soft tissue swelling, similar to 03/02/2022. The amputation site appears sharp. Slightly decreased bone mineralization compared to 03/02/2022 areas secondary to postoperative edema. Mild second through fifth interphalangeal joint space narrowing. No acute fracture dislocation. IMPRESSION: Status post amputation of the distal shaft and head of the fifth metatarsal with moderate regional soft tissue swelling, similar to 03/02/2022. Slightly decreased bone mineralization at the distal amputation site, however no definitive cortical erosion is seen at this time. Please note that recent postoperative state and radiograph modality limit evaluation for acute osteomyelitis. If there is persistent clinical concern for acute osteomyelitis, note is made that MRI would be more sensitive/specific. Electronically Signed   By: Yvonne Kendall M.D.   On: 03/06/2022 21:00   DG Foot 2 Views Right  Result Date: 03/06/2022 Please see detailed radiograph report in office note.   Review of Systems Blood pressure (!) 174/74, pulse 77, temperature 97.7 F (36.5 C), temperature source Oral, resp. rate 18, height '5\' 4"'$  (1.626 m), weight 64 kg, last menstrual period 05/22/1996, SpO2 98 %. Physical Exam General: AAO x3, NAD  Dermatological: Incision coapted without any dehiscence.  There is edema noted along the MPJ as well as the toe of the right fifth.  There is mild erythema.  No fluctuation or crepitation.       Vascular: Dorsalis Pedis artery and Posterior Tibial artery pedal pulses are 2/4 bilateral with immedate capillary fill time. There is no pain with calf compression, swelling, warmth, erythema.   Neruologic: Grossly intact via light touch bilateral.   Musculoskeletal: Tenderness to the  right fifth MPJ  Assessment/Plan: Status post right foot surgery with concern for abscess, osteomyelitis  I reviewed the MRI with the patient.  Given her symptoms as well as MRI findings discussed with her I&D, bone biopsy.  I discussed the procedure as well as postoperative course.  We discussed the risks including spread of infection, bleeding, scarring, delayed healing.  We also did discuss the possibility amputation of the toe if the bone is infected.  Patient was upset by this.  She does agree to proceed with surgery later today.  I called to schedule her with the OR.  Continue NPO.  Order to hold antibiotics until after biopsy, cultures.  Admit to hospitalist.  Trula Slade 03/07/2022, 12:55 PM

## 2022-03-07 NOTE — ED Notes (Signed)
Taken to short stay bay 37 by ED tech.

## 2022-03-07 NOTE — Transfer of Care (Signed)
Immediate Anesthesia Transfer of Care Note  Patient: Lauren [name]  Procedure(s) Performed: IRRIGATION AND DEBRIDEMENT OF FOOT WITH BONE BIOPSY (Right)  Patient Location: PACU  Anesthesia Type:MAC  Level of Consciousness: awake and alert   Airway & Oxygen Therapy: Patient Spontanous Breathing  Post-op Assessment: Report given to RN and Post -op Vital signs reviewed and stable  Post vital signs: Reviewed and stable  Last Vitals:  Vitals Value Taken Time  BP 140/68 03/07/22 1712  Temp    Pulse 79 03/07/22 1715  Resp 11 03/07/22 1715  SpO2 95 % 03/07/22 1715  Vitals shown include unvalidated device data.  Last Pain:  Vitals:   03/07/22 1543  TempSrc:   PainSc: 0-No pain         Complications: No notable events documented.

## 2022-03-07 NOTE — Anesthesia Procedure Notes (Signed)
Procedure Name: MAC Date/Time: 03/07/2022 4:25 PM  Performed by: Lorie Phenix, CRNAPre-anesthesia Checklist: Patient identified, Emergency Drugs available, Suction available and Patient being monitored Patient Re-evaluated:Patient Re-evaluated prior to induction Oxygen Delivery Method: Nasal cannula Placement Confirmation: positive ETCO2 Dental Injury: Teeth and Oropharynx as per pre-operative assessment

## 2022-03-07 NOTE — H&P (Signed)
History and Physical    Patient: Lauren [name] HTD:[id number] DOB: 1945-06-30 DOA: 03/06/2022 DOS: the patient was seen and examined on 03/07/2022 PCP: Biagio Borg, MD  Patient coming from: Home - lives with her husband, ambulates independently.    Chief Complaint: worsening right foot pain/redness/swelling   HPI: Lauren [name] is a 76 y.o. female with medical history significant of carotid stenosis, HTN, hypothyroidism, psoriasis, depression/anxiety, GERD, insomnia who presented to ED with complaints of worsening right foot pain, edema and erythema. She underwent fifth metatarsal head resection on 02/10/2022 with podiatry, Dr. Paulla Dolly. Seen in office on 10/12 and started on doxycycline. Was instructed to come to ED if symptoms of foot got worse.  She has seen podiatry twice regarding the pain in her foot. She tells me yesterday afternoon the pain and redness got worse so she came to ED.    She has been feeling good. Denies any fever/chills, vision changes/headaches, chest pain or palpitations, shortness of breath or cough, abdominal pain, N/V/D, dysuria or leg swelling.   She does not smoke, drinks alcohol socially.   ER Course:  vitals: afebrile, bp: 158/70, HR: 76, RR: 16, oxygen: 97%RA Pertinent labs: potassium: 3.1, normal wBC, normal CRP. BC obtained.  Rigth foot xray:  Status post amputation of the distal shaft and head of the fifth metatarsal with moderate regional soft tissue swelling, similar to 03/02/2022. Slightly decreased bone mineralization at the distal amputation site, however no definitive cortical erosion is seen at this time. Right foot MRI: OM involving the fifth metatarsal resection site. Surrounding fluid collections worrisome for small abscesses. Suspect small focus of early OM involving the proximal phalanx of the fifth toe also. Diffuse cellulitis and myofascitis.  In ED: BC obtained. Podiatry consulted.     Review of Systems: As mentioned in the history  of present illness. All other systems reviewed and are negative. Past Medical History:  Diagnosis Date   Anxiety    Arthritis    Carotid stenosis 10/08/2013   Cervical disc disease    Depression    Diverticulitis    Diverticulosis    Frequent headaches    Hypertension    does not now per pt.   Hypothyroidism 10/26/2015   Impaired glucose tolerance 10/08/2013   normal HgA1C   Neck pain    Psoriasis    Past Surgical History:  Procedure Laterality Date   BREAST BIOPSY Left 2011   benign    CERVICAL FUSION     approx 1980   CHOLECYSTECTOMY     COLONOSCOPY     POSTERIOR CERVICAL FUSION/FORAMINOTOMY N/A 11/05/2014   Procedure: Cervical four to Cervical seven  Posterior Cervical Fusion with lateral mass fixation;  Surgeon: Eustace Moore, MD;  Location: [facility];  Service: Neurosurgery;  Laterality: N/A;   C4 - C7 Posterior Cervical Fusion with lateral mass fixation   TOTAL ABDOMINAL HYSTERECTOMY  1998   Fibroid   UPPER GASTROINTESTINAL ENDOSCOPY     Social History:  reports that she has never smoked. She has never used smokeless tobacco. She reports current alcohol use. She reports that she does not use drugs.  Allergies  Allergen Reactions   Celebrex [Celecoxib] Itching and Rash    Family History  Problem Relation Age of Onset   Heart disease Mother    Diabetes Mother    Cervical cancer Mother    Heart attack Mother    Lung cancer Father    Alcoholism Father    Colon cancer Father  Diabetes Brother    Throat cancer Brother    Cancer Other        lung cancer   Heart disease Other    Hypertension Other    Arthritis Other    Alcohol abuse Other    Rectal cancer Neg Hx    Stomach cancer Neg Hx     Prior to Admission medications   Medication Sig Start Date End Date Taking? Authorizing Provider  acetaminophen-codeine (TYLENOL #3) 300-30 MG tablet Take 1-2 tablets by mouth every 6 (six) hours as needed for moderate pain. 07/25/21   Barrett Henle, MD  ALPRAZolam  Duanne Moron) 1 MG tablet TAKE 1/2 TO 1 TABLET BY MOUTH TWICE DAILY AS NEEDED 08/30/21   Biagio Borg, MD  amLODipine (NORVASC) 5 MG tablet TAKE 1 TABLET BY MOUTH DAILY 02/06/22   Biagio Borg, MD  aspirin EC 325 MG tablet Take 325 mg by mouth daily.    [provider]  butalbital-acetaminophen-caffeine (FIORICET) [phone] MG tablet Take 1 tablet by mouth every 6 (six) hours as needed for headache. 07/19/21   Biagio Borg, MD  Cholecalciferol (D3 ADULT PO) Take by mouth.    [provider]  cyanocobalamin 100 MCG tablet Take 100 mcg by mouth daily.    [provider]  doxycycline (VIBRA-TABS) 100 MG tablet Take 1 tablet (100 mg total) by mouth 2 (two) times daily. 03/02/22   Wallene Huh, DPM  escitalopram (LEXAPRO) 10 MG tablet TAKE 1 TABLET BY MOUTH  DAILY 05/18/21   Biagio Borg, MD  hydrochlorothiazide (MICROZIDE) 12.5 MG capsule TAKE 1 CAPSULE BY MOUTH  DAILY 11/08/21   Biagio Borg, MD  ibuprofen (ADVIL) 800 MG tablet TAKE 1 TABLET(800 MG) BY MOUTH TWICE DAILY AS NEEDED 01/10/22   Marybelle Killings, MD  levothyroxine (SYNTHROID) 50 MCG tablet TAKE 1 TABLET BY MOUTH DAILY 02/06/22   Biagio Borg, MD  oxyCODONE-acetaminophen (PERCOCET/ROXICET) 5-325 MG tablet Take 1 tablet by mouth every 4 (four) hours as needed for up to 5 days for severe pain. 03/05/22 03/10/22  Standiford, Nena Alexander, DPM  RESTASIS 0.05 % ophthalmic emulsion 1 drop 2 (two) times daily. 04/18/19   [provider]  temazepam (RESTORIL) 15 MG capsule 1-2 tab by mouth at bedtime as needed 12/23/21   Biagio Borg, MD  TYRVAYA 0.03 MG/ACT SOLN Place into both nostrils. 04/08/21   [provider]  zolpidem (AMBIEN) 5 MG tablet Take 1 tablet (5 mg total) by mouth at bedtime as needed. for sleep 12/02/21   Biagio Borg, MD    Physical Exam: Vitals:   03/07/22 2202 03/07/22 0944 03/07/22 1257 03/07/22 1441  BP: (!) 157/72 (!) 174/74 (!) 164/82 (!) 168/87  Pulse: 73 77 84 85  Resp: _0 Temp: 98.1 F (36.7 C) 97.7 F (36.5 C) 97.7 F (36.5 C) 98 F (36.7 C)  TempSrc: Oral Oral Axillary Oral  SpO2: 99% 98% 98% 99%  Weight:      Height:       General:  Appears calm and comfortable and is in NAD Eyes:  PERRL, EOMI, normal lids, iris ENT:  grossly normal hearing, lips & tongue, mmm; appropriate dentition Neck:  no LAD, masses or thyromegaly; no carotid bruits Cardiovascular:  RRR, no m/r/g. No LE edema.  Respiratory:   CTA bilaterally with no wheezes/rales/rhonchi.  Normal respiratory effort. Abdomen:  soft, NT, ND, NABS Back:   normal alignment, no CVAT Skin:  no rash or induration seen on limited exam Musculoskeletal:  grossly normal tone BUE/BLE, good ROM, no bony abnormality Lower extremity:  right foot: erythema/warmth and mild edema to lateral 5th metatarsal/plantar aspect of 4th and 5th metatarsal. TTP. No drainage.  Limited foot exam with no ulcerations.  2+ distal pulses.     Psychiatric:  grossly normal mood and affect, speech fluent and appropriate, AOx3 Neurologic:  CN 2-12 grossly intact, moves all extremities in coordinated fashion, sensation intact   Radiological Exams on Admission: Independently reviewed - see discussion in A/P where applicable  MR FOOT RIGHT WO CONTRAST  Result Date: 03/07/2022 CLINICAL DATA:  Pain, swelling and redness involving the fifth metatarsal. Recent amputation of the fifth metatarsal head. EXAM: MRI OF THE RIGHT FOREFOOT WITHOUT CONTRAST TECHNIQUE: Multiplanar, multisequence MR imaging of the right foot was performed. No intravenous contrast was administered. COMPARISON:  Radiographs 03/06/2022 FINDINGS: Abnormal T1 and T2 signal intensity involving the amputation site. There is surrounding soft tissue swelling/edema/fluid. Suspect small abscess along the plantar aspect. Findings are highly suspicious for osteomyelitis. There is also a small focus of signal abnormality/marrow edema involving the proximal phalanx of the  fifth toe which could be an early site of osteomyelitis. The other bony structures are intact. There is diffuse cellulitis and myofasciitis without definite findings for pyomyositis. IMPRESSION: 1. MR findings consistent with osteomyelitis involving the fifth metatarsal resection site. Surrounding fluid collections worrisome for small abscesses. 2. Suspect small focus of early osteomyelitis involving the proximal phalanx of the fifth toe also. 3. Diffuse cellulitis and myofasciitis. These results will be called to the ordering clinician or representative by the Radiologist Assistant, and communication documented in the PACS or [employer]. Electronically Signed   By: Marijo Sanes M.D.   On: 03/07/2022 08:13   DG Foot Complete Right  Result Date: 03/06/2022 CLINICAL DATA:  Postoperative pain and redness of fifth metatarsal. EXAM: RIGHT FOOT COMPLETE - 3+ VIEW COMPARISON:  Right foot radiographs 03/02/2022 FINDINGS: Redemonstration of amputation of the distal shaft and head of fifth metatarsal. There is moderate regional soft tissue swelling, similar to 03/02/2022. The amputation site appears sharp. Slightly decreased bone mineralization compared to 03/02/2022 areas secondary to postoperative edema. Mild second through fifth interphalangeal joint space narrowing. No acute fracture dislocation. IMPRESSION: Status post amputation of the distal shaft and head of the fifth metatarsal with moderate regional soft tissue swelling, similar to 03/02/2022. Slightly decreased bone mineralization at the distal amputation site, however no definitive cortical erosion is seen at this time. Please note that recent postoperative state and radiograph modality limit evaluation for acute osteomyelitis. If there is persistent clinical concern for acute osteomyelitis, note is made that MRI would be more sensitive/specific. Electronically Signed   By: Yvonne Kendall M.D.   On: 03/06/2022 21:00   DG Foot 2 Views Right  Result  Date: 03/06/2022 Please see detailed radiograph report in office note.   EKG: Independently reviewed.  NSR with rate 83; nonspecific ST changes with no evidence of acute ischemia   Labs on Admission: I have personally reviewed the available labs and imaging studies at the time of the admission.  Pertinent labs:   Potassium: 3.1   Assessment and Plan: Principal Problem:   Acute osteomyelitis of metatarsal bone of right foot (HCC) Active Problems:   Hypokalemia   Essential hypertension   depression and anxiety    Insomnia   Hypothyroidism   GERD (gastroesophageal reflux disease)   Carotid stenosis   Anxiety  Assessment and Plan: * Acute osteomyelitis of metatarsal bone of right foot [facility]) 76 year old female with history of 5th metatarsal head resection on 02/10/22 presenting with worsening pain, erythema and edema over incision site found to have acute OM of the 5th metatarsal resection site and suspect small focus of early OM involving the proximal phalanx of the 5th toe also. Also surrounding fluid collections worrisome for small abscesses.  -admit to telemetry -NPO -podiatry consulted -no leukocytosis, fever or SIRS/sepsis criteria. CRP wnl, ESR pending -BC obtained -plan for OR today for I&D with bone biopsies.  -hold on antibiotics until biopsies obtained  -SCD/TED hose -weight bearing per podiatry   Hypokalemia Monitor on telemetry overnight  Likely secondary to hctz  Check magnesium Replete and trend   Essential hypertension Continue norvasc 63m daily and hctz 12.521mdaily  hctz likely contributing to her hypokalemia Could stop and increase norvasc to 1060maily   depression and anxiety  No longer on SSRI Xanax prn   Insomnia Recently changed from ambient to restoril prn, continue restoril   Hypothyroidism TSH wnl 04/2021 Continue home synthroid, 90m22maily   GERD (gastroesophageal reflux disease) Continue pepcid   Carotid stenosis Hold ASA for  surgery     Advance Care Planning:   Code Status: DNR   Consults: podiatry   DVT Prophylaxis: SCDs  Family Communication: none   Severity of Illness: The appropriate patient status for this patient is INPATIENT. Inpatient status is judged to be reasonable and necessary in order to provide the required intensity of service to ensure the patient's safety. The patient's presenting symptoms, physical exam findings, and initial radiographic and laboratory data in the context of their chronic comorbidities is felt to place them at high risk for further clinical deterioration. Furthermore, it is not anticipated that the patient will be medically stable for discharge from the hospital within 2 midnights of admission.   * I certify that at the point of admission it is my clinical judgment that the patient will require inpatient hospital care spanning beyond 2 midnights from the point of admission due to high intensity of service, high risk for further deterioration and high frequency of surveillance required.*  Author: AlliOrma Flaming 03/07/2022 3:20 PM  For on call review [email or web address]

## 2022-03-07 NOTE — ED Provider Notes (Signed)
[facility] EMERGENCY DEPARTMENT Provider Note   CSN: [id number] Arrival date & time: 03/06/22  1859     History  Chief Complaint  Patient presents with   Post-op Problem   Foot Pain    Lauren [name] is a 76 y.o. female.  Patient with history of hypertension, anxiety, hypothyroidism -- is status post fifth metatarsal head resection on right, by Dr. Paulla Dolly of podiatry, end of September 2023.  Patient had initial improvement but has had worsening pain over the past 1 to 2 weeks.  No drainage.  Area has been red over the surgical site.  No fevers, nausea, vomiting.  Pain has not been improved with measures at home.  She was started on doxycycline after being seen by her podiatrist on 03/02/2022.  Pain worsened over the past couple of days.  She presents today for further evaluation.  Unfortunately, extended ED wait times prior to patient being seen.  MRI has been performed.       Home Medications Prior to Admission medications   Medication Sig Start Date End Date Taking? Authorizing Provider  acetaminophen-codeine (TYLENOL #3) 300-30 MG tablet Take 1-2 tablets by mouth every 6 (six) hours as needed for moderate pain. 07/25/21   Barrett Henle, MD  ALPRAZolam Duanne Moron) 1 MG tablet TAKE 1/2 TO 1 TABLET BY MOUTH TWICE DAILY AS NEEDED 08/30/21   Biagio Borg, MD  amLODipine (NORVASC) 5 MG tablet TAKE 1 TABLET BY MOUTH DAILY 02/06/22   Biagio Borg, MD  aspirin EC 325 MG tablet Take 325 mg by mouth daily.    [provider]  butalbital-acetaminophen-caffeine (FIORICET) [phone] MG tablet Take 1 tablet by mouth every 6 (six) hours as needed for headache. 07/19/21   Biagio Borg, MD  Cholecalciferol (D3 ADULT PO) Take by mouth.    [provider]  cyanocobalamin 100 MCG tablet Take 100 mcg by mouth daily.    [provider]  doxycycline (VIBRA-TABS) 100 MG tablet Take 1 tablet (100 mg total) by mouth 2 (two) times daily. 03/02/22   Wallene Huh, DPM  escitalopram (LEXAPRO) 10 MG tablet TAKE 1 TABLET BY MOUTH  DAILY 05/18/21   Biagio Borg, MD  hydrochlorothiazide (MICROZIDE) 12.5 MG capsule TAKE 1 CAPSULE BY MOUTH  DAILY 11/08/21   Biagio Borg, MD  ibuprofen (ADVIL) 800 MG tablet TAKE 1 TABLET(800 MG) BY MOUTH TWICE DAILY AS NEEDED 01/10/22   Marybelle Killings, MD  levothyroxine (SYNTHROID) 50 MCG tablet TAKE 1 TABLET BY MOUTH DAILY 02/06/22   Biagio Borg, MD  oxyCODONE-acetaminophen (PERCOCET/ROXICET) 5-325 MG tablet Take 1 tablet by mouth every 4 (four) hours as needed for up to 5 days for severe pain. 03/05/22 03/10/22  Standiford, Nena Alexander, DPM  RESTASIS 0.05 % ophthalmic emulsion 1 drop 2 (two) times daily. 04/18/19   [provider]  temazepam (RESTORIL) 15 MG capsule 1-2 tab by mouth at bedtime as needed 12/23/21   Biagio Borg, MD  TYRVAYA 0.03 MG/ACT SOLN Place into both nostrils. 04/08/21   [provider]  zolpidem (AMBIEN) 5 MG tablet Take 1 tablet (5 mg total) by mouth at bedtime as needed. for sleep 12/02/21   Biagio Borg, MD      Allergies    Celebrex [celecoxib]    Review of Systems   Review of Systems  Physical Exam Updated Vital Signs BP (!) 174/74   Pulse 77   Temp 97.7 F (36.5 C) (Oral)  Resp 18   Ht '5\' 4"'$  (1.626 m)   Wt 64 kg   LMP 05/22/1996   SpO2 98%   BMI 24.20 kg/m  Physical Exam Vitals and nursing note reviewed.  Constitutional:      General: She is not in acute distress.    Appearance: She is well-developed.  HENT:     Head: Normocephalic and atraumatic.     Right Ear: External ear normal.     Left Ear: External ear normal.     Nose: Nose normal.  Eyes:     Conjunctiva/sclera: Conjunctivae normal.  Cardiovascular:     Rate and Rhythm: Normal rate and regular rhythm.     Pulses:          Dorsalis pedis pulses are 2+ on the right side and 2+ on the left side.     Heart sounds: No murmur heard. Pulmonary:     Effort: No respiratory distress.     Breath  sounds: No wheezing, rhonchi or rales.  Abdominal:     Palpations: Abdomen is soft.     Tenderness: There is no abdominal tenderness. There is no guarding or rebound.  Musculoskeletal:     Cervical back: Normal range of motion and neck supple.     Right lower leg: No edema.     Left lower leg: No edema.  Skin:    General: Skin is warm and dry.     Findings: No rash.     Comments: Patient with tenderness to palpation over the distal portion of the metatarsal.  Mild overlying erythema without drainage.  Also tender to the plantar aspect of the foot over this area.  Neurological:     General: No focal deficit present.     Mental Status: She is alert. Mental status is at baseline.     Motor: No weakness.  Psychiatric:        Mood and Affect: Mood normal.     ED Results / Procedures / Treatments   Labs (all labs ordered are listed, but only abnormal results are displayed) Labs Reviewed  COMPREHENSIVE METABOLIC PANEL - Abnormal; Notable for the following components:      Result Value   Potassium 3.1 (*)    All other components within normal limits  CULTURE, BLOOD (ROUTINE X 2)  CULTURE, BLOOD (ROUTINE X 2)  CBC WITH DIFFERENTIAL/PLATELET  SEDIMENTATION RATE  C-REACTIVE PROTEIN  TYPE AND SCREEN    EKG None  Radiology MR FOOT RIGHT WO CONTRAST  Result Date: 03/07/2022 CLINICAL DATA:  Pain, swelling and redness involving the fifth metatarsal. Recent amputation of the fifth metatarsal head. EXAM: MRI OF THE RIGHT FOREFOOT WITHOUT CONTRAST TECHNIQUE: Multiplanar, multisequence MR imaging of the right foot was performed. No intravenous contrast was administered. COMPARISON:  Radiographs 03/06/2022 FINDINGS: Abnormal T1 and T2 signal intensity involving the amputation site. There is surrounding soft tissue swelling/edema/fluid. Suspect small abscess along the plantar aspect. Findings are highly suspicious for osteomyelitis. There is also a small focus of signal abnormality/marrow edema  involving the proximal phalanx of the fifth toe which could be an early site of osteomyelitis. The other bony structures are intact. There is diffuse cellulitis and myofasciitis without definite findings for pyomyositis. IMPRESSION: 1. MR findings consistent with osteomyelitis involving the fifth metatarsal resection site. Surrounding fluid collections worrisome for small abscesses. 2. Suspect small focus of early osteomyelitis involving the proximal phalanx of the fifth toe also. 3. Diffuse cellulitis and myofasciitis. These results will be called to the  ordering clinician or representative by the Radiologist Assistant, and communication documented in the PACS or [employer]. Electronically Signed   By: Marijo Sanes M.D.   On: 03/07/2022 08:13   DG Foot Complete Right  Result Date: 03/06/2022 CLINICAL DATA:  Postoperative pain and redness of fifth metatarsal. EXAM: RIGHT FOOT COMPLETE - 3+ VIEW COMPARISON:  Right foot radiographs 03/02/2022 FINDINGS: Redemonstration of amputation of the distal shaft and head of fifth metatarsal. There is moderate regional soft tissue swelling, similar to 03/02/2022. The amputation site appears sharp. Slightly decreased bone mineralization compared to 03/02/2022 areas secondary to postoperative edema. Mild second through fifth interphalangeal joint space narrowing. No acute fracture dislocation. IMPRESSION: Status post amputation of the distal shaft and head of the fifth metatarsal with moderate regional soft tissue swelling, similar to 03/02/2022. Slightly decreased bone mineralization at the distal amputation site, however no definitive cortical erosion is seen at this time. Please note that recent postoperative state and radiograph modality limit evaluation for acute osteomyelitis. If there is persistent clinical concern for acute osteomyelitis, note is made that MRI would be more sensitive/specific. Electronically Signed   By: Yvonne Kendall M.D.   On: 03/06/2022 21:00    DG Foot 2 Views Right  Result Date: 03/06/2022 Please see detailed radiograph report in office note.   Procedures Procedures    Medications Ordered in ED Medications  chlorhexidine (PERIDEX) 0.12 % solution (  Not Given 03/07/22 1351)  oxyCODONE-acetaminophen (PERCOCET/ROXICET) 5-325 MG per tablet 1 tablet (1 tablet Oral Given 03/06/22 2018)  oxyCODONE-acetaminophen (PERCOCET/ROXICET) 5-325 MG per tablet 1 tablet (1 tablet Oral Given 03/07/22 1251)    ED Course/ Medical Decision Making/ A&P    Patient seen and examined. History obtained directly from patient.  I also reviewed patient's previous podiatry notes.  Work-up including labs, imaging, EKG ordered in triage, if performed, were reviewed.    Labs/EKG: CBC, BMP, CRP and sed rate were ordered.  Imaging: Independently visualized and interpreted.  This included: X-ray and MRI of the right foot, agree with history of fluid collection, concern for small abscess, concern for osteomyelitis.  Medications/Fluids: Will need IV antibiotics.  Most recent vital signs reviewed and are as follows: BP (!) 174/74   Pulse 77   Temp 97.7 F (36.5 C) (Oral)   Resp 18   Ht '5\' 4"'$  (1.626 m)   Wt 64 kg   LMP 05/22/1996   SpO2 98%   BMI 24.20 kg/m   Initial impression: Right foot postoperative infection, will consult with podiatry.  12:11 PM Reassessment performed. Patient appears comfortable.  Tearful about the possibility of needing a procedure.  Labs personally reviewed and interpreted including: Awaiting results.  Most current vital signs reviewed and are as follows: BP (!) 174/74   Pulse 77   Temp 97.7 F (36.5 C) (Oral)   Resp 18   Ht '5\' 4"'$  (1.626 m)   Wt 64 kg   LMP 05/22/1996   SpO2 98%   BMI 24.20 kg/m   Plan: I have consulted with Dr. Jacqualyn Posey of podiatry.  He would be here to see the patient.  Requests holding off on antibiotics at this time in order to obtain cultures during possible procedure if needed.  2:16  PM Reassessment performed. Patient appears stable.   Labs personally reviewed and interpreted including: CBC unremarkable; CMP hypokalemia 3.1 otherwise unremarkable.  Most current vital signs reviewed and are as follows: BP (!) 164/82 (BP Location: Right Arm)   Pulse 84   Temp  97.7 F (36.5 C) (Axillary)   Resp 15   Ht '5\' 4"'$  (1.626 m)   Wt 64 kg   LMP 05/22/1996   SpO2 98%   BMI 24.20 kg/m   Plan: Podiatry has seen patient.  Plan for I&D and bone biopsy, hopefully later this afternoon.  We will maintain n.p.o. status.  Plan for hospitalist admit at request of podiatry.  2:43 PM Consulted with Dr. Rogers Blocker [facility] who will see patient.                           Medical Decision Making Amount and/or Complexity of Data Reviewed Labs: ordered.  Risk Prescription drug management. Decision regarding hospitalization.   Patient with worsening foot pain after recent surgical procedure.  She has been on oral antibiotics, however concern for osteomyelitis and fluid collection on MRI today.  She does not appear to be septic.  No clinical signs of DVT.  No history of PAD and she has great pedal pulses today.        Final Clinical Impression(s) / ED Diagnoses Final diagnoses:  Osteomyelitis of right foot, unspecified type ([place])  Abscess of right foot    Rx / DC Orders ED Discharge Orders     None         Carlisle Cater, PA-C 03/07/22 1443    Kommor, Debe Coder, MD 03/07/22 1519

## 2022-03-07 NOTE — Assessment & Plan Note (Addendum)
Recently changed from ambient to restoril prn, continue restoril

## 2022-03-07 NOTE — Anesthesia Preprocedure Evaluation (Addendum)
Anesthesia Evaluation  Patient identified by MRN, date of birth, ID band Patient awake    Reviewed: Allergy & Precautions, NPO status , Patient's Chart, lab work & pertinent test results  Airway Mallampati: II  TM Distance: >3 FB Neck ROM: Full    Dental no notable dental hx.    Pulmonary neg pulmonary ROS   Pulmonary exam normal        Cardiovascular hypertension, Pt. on medications Normal cardiovascular exam     Neuro/Psych  Headaches PSYCHIATRIC DISORDERS Anxiety Depression       GI/Hepatic negative GI ROS, Neg liver ROS,,,  Endo/Other  Hypothyroidism    Renal/GU negative Renal ROS     Musculoskeletal  (+) Arthritis ,    Abdominal   Peds  Hematology negative hematology ROS (+)   Anesthesia Other Findings osteomyelitis  Reproductive/Obstetrics                             Anesthesia Physical Anesthesia Plan  ASA: 2  Anesthesia Plan: MAC   Post-op Pain Management:    Induction: Intravenous  PONV Risk Score and Plan: 2 and Ondansetron, Dexamethasone, Propofol infusion and Treatment may vary due to age or medical condition  Airway Management Planned: Simple Face Mask  Additional Equipment:   Intra-op Plan:   Post-operative Plan:   Informed Consent: I have reviewed the patients History and Physical, chart, labs and discussed the procedure including the risks, benefits and alternatives for the proposed anesthesia with the patient or authorized representative who has indicated his/her understanding and acceptance.   Patient has DNR.  Discussed DNR with patient and Suspend DNR.   Dental advisory given  Plan Discussed with: CRNA  Anesthesia Plan Comments:         Anesthesia Quick Evaluation

## 2022-03-07 NOTE — Op Note (Signed)
PATIENT:  Lauren [name]  76 y.o. female   PRE-OPERATIVE DIAGNOSIS:  osteomyelitis, abscess right foot   POST-OPERATIVE DIAGNOSIS:  osteomyelitis, abscess right foot   PROCEDURE:  Procedure(s): IRRIGATION AND DEBRIDEMENT OF FOOT WITH BONE BIOPSY (Right)   SURGEON:  Surgeon(s) and Role:    * Trula Slade, DPM - Primary   PHYSICIAN ASSISTANT:    ASSISTANTS: none    ANESTHESIA:   local and MAC   EBL:  minimal    BLOOD ADMINISTERED:none   DRAINS: none    LOCAL MEDICATIONS USED:  MARCAINE   , BUPIVICAINE , and Amount: 20 ml   SPECIMEN:  Source of Specimen:  wound culture, bone path/micro  5th toe, bone/path 5th metatarsal   DISPOSITION OF SPECIMEN:  PATHOLOGY   COUNTS:  YES   TOURNIQUET:  * No tourniquets in log *   DICTATION: .Dragon Dictation   PLAN OF CARE: Admit to inpatient    PATIENT DISPOSITION:  PACU - hemodynamically stable.   Delay start of Pharmacological VTE agent (>24hrs) due to surgical blood loss or risk of bleeding: no   Indications for surgery: 76 year old female presents for worsening pain, swelling, redness status post right fifth metatarsal head resection.  MRI was performed which was concerning for osteomyelitis as well as Cysts.  Despite having normal blood work MRI was concerning today for discussed with her surgical debridement, bone culture.  Discussed the procedure as well as postoperative course.  Alternatives risks and complications were discussed.  No promises or guarantees were given second the procedure and all questions were answered the best my ability.  Please do in detail: Patient's with verbally and visually identified by myself and nursing staff and the anesthesia staff preoperatively.  She was then transferred to the operating room via stretcher and placed in the open table in supine position.  After adequate plane of anesthesia was obtained timeout was performed a mixture of 10 mL of lidocaine, Marcaine plain was infiltrated the  region block fashion.  The right lower extremities and scrubbed prepped and draped normal sterile fashion.  A secondary timeout was performed.  Incision was made through the prior incision and remove the sutures that were intact.  Incision was then deepened with a 15 with scalpel to the level of the MPJ just lateral to the extensor tendon.  If the previous metatarsal head resection was identified.  There is no purulence noted.  I utilized a hemostat to debride and explore the wound and no purulence was noted.  I did take a wound culture.  Soft tissue structures then freed from the metatarsal distally as well as the proximal phalanx.  I then utilized a rongeur to resect bone from the fifth metatarsal and fifth digit and this was sent to both pathology as well as microbiology.  At this time incision was copiously irrigated with saline hemostasis achieved.  Incision was closed with the subcutaneous use with Monocryl and skin was then closed with 3-0 Prolene.  Xeroform is applied followed by dry sterile dressing.  She will woken from anesthesia and found to tolerate the procedure well any complications.  She was then transferred to PACU vital signs stable vascular status intact.  Postop course: Pain as tolerated surgical shoe.  Await cultures.

## 2022-03-07 NOTE — Assessment & Plan Note (Signed)
Continue norvasc '5mg'$  daily and hctz 12.'5mg'$  daily  hctz likely contributing to her hypokalemia Could stop and increase norvasc to '10mg'$  daily

## 2022-03-07 NOTE — Anesthesia Postprocedure Evaluation (Signed)
Anesthesia Post Note  Patient: Lauren [name]  Procedure(s) Performed: IRRIGATION AND DEBRIDEMENT OF FOOT WITH BONE BIOPSY (Right)     Patient location during evaluation: PACU Anesthesia Type: MAC Level of consciousness: awake Pain management: pain level controlled Vital Signs Assessment: post-procedure vital signs reviewed and stable Respiratory status: spontaneous breathing, nonlabored ventilation, respiratory function stable and patient connected to nasal cannula oxygen Cardiovascular status: stable and blood pressure returned to baseline Postop Assessment: no apparent nausea or vomiting Anesthetic complications: no   No notable events documented.  Last Vitals:  Vitals:   03/07/22 1745 03/07/22 1800  BP: (!) 153/73 (!) 155/73  Pulse: 73 74  Resp: 11 14  Temp:    SpO2: 97% 96%    Last Pain:  Vitals:   03/07/22 1800  TempSrc:   PainSc: 0-No pain        RLE Motor Response: Purposeful movement;Responds to commands (03/07/22 1800) RLE Sensation: Full sensation (03/07/22 1800)      Citlaly Camplin P Collier Bohnet

## 2022-03-07 NOTE — Interval H&P Note (Signed)
History and Physical Interval Note:  03/07/2022 4:11 PM  Lauren [name]  has presented today for surgery, with the diagnosis of osteomylitis.  The various methods of treatment have been discussed with the patient and family. After consideration of risks, benefits and other options for treatment, the patient has consented to  Procedure(s): IRRIGATION AND DEBRIDEMENT OF FOOT WITH BONE BIOPSY (Right) as a surgical intervention.  The patient's history has been reviewed, patient examined, no change in status, stable for surgery.  I have reviewed the patient's chart and labs.  Questions were answered to the patient's satisfaction.     Trula Slade

## 2022-03-07 NOTE — Telephone Encounter (Signed)
Received a call from [facility] @ [facility] reqarding this pt. If you could please call them back and speak to [place] at the ED. The number is [phone]

## 2022-03-07 NOTE — Assessment & Plan Note (Signed)
No longer on SSRI Xanax prn

## 2022-03-07 NOTE — Progress Notes (Signed)
Pharmacy Antibiotic Note  Lauren [name] is a 76 y.o. female admitted on 03/06/2022 with  osteo .  Pharmacy has been consulted for osteomyelitis dosing.  S/p I&D with cultures sent. Empiric vanc/cefepime  Plan: Cefepime 2g IV q12 Vanc 1g IV q24>>AUC 465, scr 0.8 Level as needed   Height: '5\' 4"'$  (162.6 cm) Weight: 64.4 kg (142 lb) IBW/kg (Calculated) : 54.7  Temp (24hrs), Avg:97.9 F (36.6 C), Min:97.4 F (36.3 C), Max:98.2 F (36.8 C)  Recent Labs  Lab 03/07/22 1243  WBC 7.2  CREATININE 0.67    Estimated Creatinine Clearance: 50.9 mL/min (by C-G formula based on SCr of 0.67 mg/dL).    Allergies  Allergen Reactions   Celebrex [Celecoxib] Itching and Rash    Antimicrobials this admission: 10/17 vanc>> 10/17 cefepime>>  Dose adjustments this admission:   Microbiology results: 10/17 blood>> 10/17 wound/bone>>  Onnie Boer, PharmD, [place], AAHIVP, CPP Infectious Disease Pharmacist 03/07/2022 7:24 PM

## 2022-03-07 NOTE — Assessment & Plan Note (Signed)
77 year old female with history of 5th metatarsal head resection on 02/10/22 presenting with worsening pain, erythema and edema over incision site found to have acute OM of the 5th metatarsal resection site and suspect small focus of early OM involving the proximal phalanx of the 5th toe also. Also surrounding fluid collections worrisome for small abscesses.  -admit to telemetry -NPO -podiatry consulted -no leukocytosis, fever or SIRS/sepsis criteria. CRP wnl, ESR pending -BC obtained -plan for OR today for I&D with bone biopsies.  -hold on antibiotics until biopsies obtained  -SCD/TED hose -weight bearing per podiatry

## 2022-03-07 NOTE — Assessment & Plan Note (Signed)
Continue pepcid

## 2022-03-07 NOTE — Assessment & Plan Note (Signed)
Hold ASA for surgery

## 2022-03-07 NOTE — Brief Op Note (Signed)
03/07/2022  5:07 PM  PATIENT:  Lauren [name]  76 y.o. female  PRE-OPERATIVE DIAGNOSIS:  osteomyelitis, abscess right foot  POST-OPERATIVE DIAGNOSIS:  osteomyelitis, abscess right foot  PROCEDURE:  Procedure(s): IRRIGATION AND DEBRIDEMENT OF FOOT WITH BONE BIOPSY (Right)  SURGEON:  Surgeon(s) and Role:    * Trula Slade, DPM - Primary  PHYSICIAN ASSISTANT:   ASSISTANTS: none   ANESTHESIA:   local and MAC  EBL:  minimal   BLOOD ADMINISTERED:none  DRAINS: none   LOCAL MEDICATIONS USED:  MARCAINE   , BUPIVICAINE , and Amount: 20 ml  SPECIMEN:  Source of Specimen:  wound culture, bone path/micro  5th toe, bone/path 5th metatarsal  DISPOSITION OF SPECIMEN:  PATHOLOGY  COUNTS:  YES  TOURNIQUET:  * No tourniquets in log *  DICTATION: .Dragon Dictation  PLAN OF CARE: Admit to inpatient   PATIENT DISPOSITION:  PACU - hemodynamically stable.   Delay start of Pharmacological VTE agent (>24hrs) due to surgical blood loss or risk of bleeding: no   Intraoperative findings: No purulence noted. Wound culture obtained as well as path and micro from both the 5th metatarsal and 5th toe.

## 2022-03-07 NOTE — Telephone Encounter (Unsigned)
Spoke with patient yesterday concerning her pain surgical site right foot. She stated it seems to be feeling better this morning and she is wearing a shoe. She has had a lot of throbbing in the foot over the weekend. I advised her to finish her antibiotic and keep her appointment for Thursday. If pain were to worsen again I want her to contact us and come in earlier or if there is any systemic signs of infection to go to the emergency room and they will contact our Doctor on call

## 2022-03-07 NOTE — Assessment & Plan Note (Signed)
TSH wnl 04/2021 Continue home synthroid, 26mg daily

## 2022-03-08 ENCOUNTER — Encounter (HOSPITAL_COMMUNITY): Payer: Self-pay | Admitting: Podiatry

## 2022-03-08 DIAGNOSIS — E876 Hypokalemia: Secondary | ICD-10-CM | POA: Diagnosis not present

## 2022-03-08 DIAGNOSIS — M86171 Other acute osteomyelitis, right ankle and foot: Secondary | ICD-10-CM | POA: Diagnosis not present

## 2022-03-08 DIAGNOSIS — I10 Essential (primary) hypertension: Secondary | ICD-10-CM

## 2022-03-08 DIAGNOSIS — L02611 Cutaneous abscess of right foot: Secondary | ICD-10-CM | POA: Diagnosis not present

## 2022-03-08 LAB — BASIC METABOLIC PANEL
Anion gap: 10 (ref 5–15)
BUN: 9 mg/dL (ref 8–23)
CO2: 29 mmol/L (ref 22–32)
Calcium: 9.5 mg/dL (ref 8.9–10.3)
Chloride: 103 mmol/L (ref 98–111)
Creatinine, Ser: 0.71 mg/dL (ref 0.44–1.00)
GFR, Estimated: >60 mL/min (ref >60)
Glucose, Bld: 104 mg/dL — ABNORMAL HIGH (ref 70–99)
Potassium: 3.5 mmol/L (ref 3.5–5.1)
Sodium: 142 mmol/L (ref 135–145)

## 2022-03-08 LAB — CBC
HCT: 34.9 % — ABNORMAL LOW (ref 36.0–46.0)
Hemoglobin: 11.8 g/dL — ABNORMAL LOW (ref 12.0–15.0)
MCH: 31.8 pg (ref 26.0–34.0)
MCHC: 33.8 g/dL (ref 30.0–36.0)
MCV: 94.1 fL (ref 80.0–100.0)
Platelets: 211 10*3/uL (ref 150–400)
RBC: 3.71 MIL/uL — ABNORMAL LOW (ref 3.87–5.11)
RDW: 13.6 % (ref 11.5–15.5)
WBC: 6.3 10*3/uL (ref 4.0–10.5)
nRBC: 0 % (ref 0.0–0.2)

## 2022-03-08 LAB — ABO/RH: ABO/RH(D): O POS

## 2022-03-08 MED ORDER — DOXYCYCLINE HYCLATE 100 MG PO TABS
100.0000 mg | ORAL_TABLET | Freq: Two times a day (BID) | ORAL | Status: DC
  Administered 2022-03-08 – 2022-03-09 (×3): 100 mg via ORAL
  Filled 2022-03-08 (×3): qty 1

## 2022-03-08 MED ORDER — HYDROCHLOROTHIAZIDE 12.5 MG PO TABS
12.5000 mg | ORAL_TABLET | Freq: Every day | ORAL | Status: DC
  Administered 2022-03-08 – 2022-03-09 (×2): 12.5 mg via ORAL
  Filled 2022-03-08 (×2): qty 1

## 2022-03-08 MED ORDER — AMOXICILLIN-POT CLAVULANATE 875-125 MG PO TABS
1.0000 | ORAL_TABLET | Freq: Two times a day (BID) | ORAL | Status: DC
  Administered 2022-03-08 – 2022-03-09 (×3): 1 via ORAL
  Filled 2022-03-08 (×3): qty 1

## 2022-03-08 MED ORDER — MORPHINE SULFATE (PF) 4 MG/ML IV SOLN
4.0000 mg | INTRAVENOUS | Status: DC | PRN
  Administered 2022-03-08: 4 mg via INTRAVENOUS
  Filled 2022-03-08: qty 1

## 2022-03-08 MED ORDER — POTASSIUM CHLORIDE 20 MEQ PO PACK
40.0000 meq | PACK | Freq: Two times a day (BID) | ORAL | Status: AC
  Administered 2022-03-08 (×2): 40 meq via ORAL
  Filled 2022-03-08 (×2): qty 2

## 2022-03-08 NOTE — Progress Notes (Signed)
  Subjective:  Patient ID: Lauren [name], female    DOB: [birth year],  MRN: [id number]  A 76 y.o. female presents with concern for right foot abscess osteomyelitis now status post postop day 1 irrigation debridement with bone biopsy.  Patient states she is having pain in the foot.  She has not been able to get rest.  No nausea fever chills vomiting. Objective:   Vitals:   03/08/22 0938 03/08/22 1455  BP: (!) 152/70 (!) 144/64  Pulse:  95  Resp:    Temp:  98.6 F (37 C)  SpO2:  98%   General AA&O x3. Normal mood and affect.  Vascular Dorsalis pedis and posterior tibial pulses 2/4 bilat. Brisk capillary refill to all digits. Pedal hair present.  Neurologic Epicritic sensation grossly intact.  Dermatologic No clinical signs of infection noted no redness noted skin incisions healing well.  Orthopedic: MMT 5/5 in dorsiflexion, plantarflexion, inversion, and eversion. Normal joint ROM without pain or crepitus.     Assessment & Plan:  Patient was evaluated and treated and all questions answered.  Right foot abscess/osteomyelitis status post postop day 1 irrigation and debridement with bone biopsy -All questions and concerns were discussed with the patient in extensive detail -Pain control per primary team -She is okay to be discharged from podiatric standpoint.  She will be weightbearing as tolerated in surgical shoe. -Infectious disease recommending recommending doxycycline and Augmentin for 2 weeks and will follow-up in clinic for cultures and pathology -No dressing change until follow-up -Follow-up with Dr. Jacqualyn Posey 1 week from discharge Culture growth: No growth so far  Felipa Furnace, DPM  Accessible via secure chat for questions or concerns.

## 2022-03-08 NOTE — Progress Notes (Signed)
[facility] PROGRESS NOTE    Progress Note  Lauren [name]  IHW:[id number] DOB: June 11, 1945 DOA: 03/06/2022 PCP: Biagio Borg, MD     Brief Narrative:   Lauren [name] is an 76 y.o. female past medical history of carotid stenosis, essential hypertension and psoriasis comes in complaining of right foot pain edema and erythema, she underwent 1/5 metatarsal resection on 02/10/2022 seen in the office on 11/30/2021 started on Doxy she relates her symptoms got worst she came into the ED   Assessment/Plan:   Acute osteomyelitis and abscess of metatarsal bone of right foot (HCC) No leukocytosis or fever CRP and ESR are unremarkable. Podiatrist has been consulted. Blood cultures were obtained. Antibiotics were not started until biopsy obtained. She is status post I&D Started empirically on IV cefepime and IV vancomycin  Hypokalemia In the setting of hypovolemia and hydrochlorothiazide. Repleted orally, this morning is still 3.5 continue oral repletion magnesium is 2.2.  Essential hypertension Continue Norvasc and hydrochlorothiazide.   Depression and anxiety  Continue Xanax as needed.  Insomnia Continue Restoril.  Hypothyroidism Continue Synthroid.   DVT prophylaxis: lovenxo Family Communication:none Status is: Inpatient Remains inpatient appropriate because: Right foot cellulitis    Code Status:     Code Status Orders  (From admission, onward)           Start     Ordered   03/07/22 1507  Do not attempt resuscitation (DNR)  Continuous       Question Answer Comment  In the event of cardiac or respiratory ARREST Do not call a "code blue"   In the event of cardiac or respiratory ARREST Do not perform Intubation, CPR, defibrillation or ACLS   In the event of cardiac or respiratory ARREST Use medication by any route, position, wound care, and other measures to relive pain and suffering. May use oxygen, suction and manual treatment of airway obstruction as  needed for comfort.      03/07/22 1507           Code Status History     Date Active Date Inactive Code Status Order ID Comments User Context   11/05/2014 1727 11/08/2014 2122 Full Code [id number]  Eustace Moore, MD Inpatient   08/30/2014 1821 08/31/2014 1843 Full Code [id number]  Eustace Moore, MD Inpatient         IV Access:   Peripheral IV   Procedures and diagnostic studies:   DG Foot Complete Right  Result Date: 03/07/2022 CLINICAL DATA:  Postoperative check EXAM: RIGHT FOOT COMPLETE - 3+ VIEW COMPARISON:  Right foot radiographs 03/06/2022, MRI right forefoot 03/07/2022 FINDINGS: New further resection of a portion of the distal shaft of the 5th metatarsal. No definite resection of a portion of the proximal phalanx of the fifth toe where there was mild lateral proximal marrow edema on recent MRI earlier today. There is moderate soft tissue swelling within the distal lateral forefoot. Mild postoperative subcutaneous air. IMPRESSION: New further resection of a portion of the distal shaft of the 5th metatarsal, the region concerning for osteomyelitis on recent MRI. Electronically Signed   By: Yvonne Kendall M.D.   On: 03/07/2022 17:58   MR FOOT RIGHT WO CONTRAST  Result Date: 03/07/2022 CLINICAL DATA:  Pain, swelling and redness involving the fifth metatarsal. Recent amputation of the fifth metatarsal head. EXAM: MRI OF THE RIGHT FOREFOOT WITHOUT CONTRAST TECHNIQUE: Multiplanar, multisequence MR imaging of the right foot was performed. No intravenous contrast was administered. COMPARISON:  Radiographs 03/06/2022 FINDINGS:  Abnormal T1 and T2 signal intensity involving the amputation site. There is surrounding soft tissue swelling/edema/fluid. Suspect small abscess along the plantar aspect. Findings are highly suspicious for osteomyelitis. There is also a small focus of signal abnormality/marrow edema involving the proximal phalanx of the fifth toe which could be an early site of  osteomyelitis. The other bony structures are intact. There is diffuse cellulitis and myofasciitis without definite findings for pyomyositis. IMPRESSION: 1. MR findings consistent with osteomyelitis involving the fifth metatarsal resection site. Surrounding fluid collections worrisome for small abscesses. 2. Suspect small focus of early osteomyelitis involving the proximal phalanx of the fifth toe also. 3. Diffuse cellulitis and myofasciitis. These results will be called to the ordering clinician or representative by the Radiologist Assistant, and communication documented in the PACS or [employer]. Electronically Signed   By: Marijo Sanes M.D.   On: 03/07/2022 08:13   DG Foot Complete Right  Result Date: 03/06/2022 CLINICAL DATA:  Postoperative pain and redness of fifth metatarsal. EXAM: RIGHT FOOT COMPLETE - 3+ VIEW COMPARISON:  Right foot radiographs 03/02/2022 FINDINGS: Redemonstration of amputation of the distal shaft and head of fifth metatarsal. There is moderate regional soft tissue swelling, similar to 03/02/2022. The amputation site appears sharp. Slightly decreased bone mineralization compared to 03/02/2022 areas secondary to postoperative edema. Mild second through fifth interphalangeal joint space narrowing. No acute fracture dislocation. IMPRESSION: Status post amputation of the distal shaft and head of the fifth metatarsal with moderate regional soft tissue swelling, similar to 03/02/2022. Slightly decreased bone mineralization at the distal amputation site, however no definitive cortical erosion is seen at this time. Please note that recent postoperative state and radiograph modality limit evaluation for acute osteomyelitis. If there is persistent clinical concern for acute osteomyelitis, note is made that MRI would be more sensitive/specific. Electronically Signed   By: Yvonne Kendall M.D.   On: 03/06/2022 21:00     Medical Consultants:   None.   Subjective:    Lauren [name]  relates her pain is controlled.  Objective:    Vitals:   03/07/22 2045 03/08/22 0344 03/08/22 0822 03/08/22 0938  BP: (!) 153/68 126/60 (!) 131/59 (!) 152/70  Pulse:  73 80   Resp: $Remo'16 14 17   'LCtYO$ Temp: 98.2 F (36.8 C) 98.6 F (37 C) 98.4 F (36.9 C)   TempSrc:  Oral Oral   SpO2: 97% 93% 98%   Weight:      Height:       SpO2: 98 %   Intake/Output Summary (Last 24 hours) at 03/08/2022 1236 Last data filed at 03/07/2022 1705 Gross per 24 hour  Intake 500 ml  Output 10 ml  Net 490 ml   Filed Weights   03/06/22 1942 03/07/22 1522  Weight: 64 kg 64.4 kg    Exam: General exam: In no acute distress. Respiratory system: Good air movement and clear to auscultation. Cardiovascular system: S1 & S2 heard, RRR. No JVD. Gastrointestinal system: Abdomen is nondistended, soft and nontender.  Extremities: No pedal edema. Skin: No rashes, lesions or ulcers Psychiatry: Judgement and insight appear normal. Mood & affect appropriate.    Data Reviewed:    Labs: Basic Metabolic Panel: Recent Labs  Lab 03/07/22 1243 03/08/22 0329  NA 142 142  K 3.1* 3.5  CL 104 103  CO2 26 29  GLUCOSE 88 104*  BUN 12 9  CREATININE 0.67 0.71  CALCIUM 9.8 9.5  MG 2.2  --    GFR Estimated Creatinine Clearance: 50.9  mL/min (by C-G formula based on SCr of 0.71 mg/dL). Liver Function Tests: Recent Labs  Lab 03/07/22 1243  AST 18  ALT 16  ALKPHOS 78  BILITOT 1.0  PROT 7.3  ALBUMIN 4.2   No results for input(s): "LIPASE", "AMYLASE" in the last 168 hours. No results for input(s): "AMMONIA" in the last 168 hours. Coagulation profile No results for input(s): "INR", "PROTIME" in the last 168 hours. COVID-19 Labs  Recent Labs    03/07/22 1243  CRP 0.6    Lab Results  Component Value Date   SARSCOV2NAA RESULT:  NEGATIVE 05/08/2019    CBC: Recent Labs  Lab 03/07/22 1243 03/08/22 0329  WBC 7.2 6.3  NEUTROABS 4.8  --   HGB 13.5 11.8*  HCT 40.2 34.9*  MCV 95.3 94.1  PLT 246  211   Cardiac Enzymes: No results for input(s): "CKTOTAL", "CKMB", "CKMBINDEX", "TROPONINI" in the last 168 hours. BNP (last 3 results) No results for input(s): "PROBNP" in the last 8760 hours. CBG: No results for input(s): "GLUCAP" in the last 168 hours. D-Dimer: No results for input(s): "DDIMER" in the last 72 hours. Hgb A1c: No results for input(s): "HGBA1C" in the last 72 hours. Lipid Profile: No results for input(s): "CHOL", "HDL", "LDLCALC", "TRIG", "CHOLHDL", "LDLDIRECT" in the last 72 hours. Thyroid function studies: No results for input(s): "TSH", "T4TOTAL", "T3FREE", "THYROIDAB" in the last 72 hours.  Invalid input(s): "FREET3" Anemia work up: No results for input(s): "VITAMINB12", "FOLATE", "FERRITIN", "TIBC", "IRON", "RETICCTPCT" in the last 72 hours. Sepsis Labs: Recent Labs  Lab 03/07/22 1243 03/08/22 0329  WBC 7.2 6.3   Microbiology Recent Results (from the past 240 hour(s))  Blood culture (routine x 2)     Status: None (Preliminary result)   Collection Time: 03/07/22 11:37 AM   Specimen: BLOOD  Result Value Ref Range Status   Specimen Description BLOOD RIGHT ANTECUBITAL  Final   Special Requests   Final    BOTTLES DRAWN AEROBIC AND ANAEROBIC Blood Culture adequate volume   Culture   Final    NO GROWTH < 24 HOURS Performed at [facility], [address], [place], [place] [address]    Report Status PENDING  Incomplete  Blood culture (routine x 2)     Status: None (Preliminary result)   Collection Time: 03/07/22 11:42 AM   Specimen: BLOOD  Result Value Ref Range Status   Specimen Description BLOOD LEFT ANTECUBITAL  Final   Special Requests   Final    BOTTLES DRAWN AEROBIC AND ANAEROBIC Blood Culture results may not be optimal due to an excessive volume of blood received in culture bottles   Culture   Final    NO GROWTH < 24 HOURS Performed at [facility], [place] [address], [place], [place] [address]    Report Status PENDING  Incomplete   Aerobic/Anaerobic Culture w Gram Stain (surgical/deep wound)     Status: None (Preliminary result)   Collection Time: 03/07/22  4:46 PM   Specimen: Wound  Result Value Ref Range Status   Specimen Description WOUND RIGHT FOOT  Final   Special Requests SAMPLE A  Final   Gram Stain PENDING  Incomplete   Culture   Final    NO GROWTH < 24 HOURS Performed at [facility], [place] [address] [place], [place] [address]    Report Status PENDING  Incomplete  Aerobic/Anaerobic Culture w Gram Stain (surgical/deep wound)     Status: None (Preliminary result)   Collection  Time: 03/07/22  4:48 PM   Specimen: Bone; Tissue  Result Value Ref Range Status   Specimen Description BONE  Final   Special Requests FROM 5TH METATARSAL SAMPLE B  Final   Gram Stain PENDING  Incomplete   Culture   Final    NO GROWTH < 24 HOURS Performed at [facility], [place] [address], [place], [place] [address]    Report Status PENDING  Incomplete  Aerobic/Anaerobic Culture w Gram Stain (surgical/deep wound)     Status: None (Preliminary result)   Collection Time: 03/07/22  4:53 PM   Specimen: Bone; Tissue  Result Value Ref Range Status   Specimen Description BONE  Final   Special Requests RT 5TH TOE SAMPLE C  Final   Gram Stain PENDING  Incomplete   Culture   Final    NO GROWTH < 24 HOURS Performed at [facility], [place] [address], [place], [place] [address]    Report Status PENDING  Incomplete  Aerobic/Anaerobic Culture w Gram Stain (surgical/deep wound)     Status: None (Preliminary result)   Collection Time: 03/07/22  4:54 PM   Specimen: Bone; Tissue  Result Value Ref Range Status   Specimen Description BONE  Final   Special Requests RT 5TH TOE SAMPLE D  Final   Gram Stain PENDING  Incomplete   Culture   Final    NO GROWTH < 24 HOURS Performed at [facility], [place] [address], [place], [place] [address]    Report Status PENDING  Incomplete     Medications:    amLODipine  5 mg Oral  Daily   cycloSPORINE  1 drop Both Eyes BID   famotidine  40 mg Oral Daily   hydrochlorothiazide  12.5 mg Oral Daily   levothyroxine  50 mcg Oral Q0600   Continuous Infusions:  ceFEPime (MAXIPIME) IV 2 g (03/08/22 0847)   vancomycin 1,000 mg (03/07/22 2044)      LOS: 1 day   Charlynne Cousins  [facility]  03/08/2022, 12:36 PM

## 2022-03-09 ENCOUNTER — Other Ambulatory Visit (HOSPITAL_COMMUNITY): Payer: Self-pay

## 2022-03-09 ENCOUNTER — Encounter: Payer: Medicare Other | Admitting: Podiatry

## 2022-03-09 DIAGNOSIS — F419 Anxiety disorder, unspecified: Secondary | ICD-10-CM

## 2022-03-09 DIAGNOSIS — L02611 Cutaneous abscess of right foot: Secondary | ICD-10-CM | POA: Diagnosis not present

## 2022-03-09 DIAGNOSIS — M86171 Other acute osteomyelitis, right ankle and foot: Secondary | ICD-10-CM | POA: Diagnosis not present

## 2022-03-09 LAB — BASIC METABOLIC PANEL
Anion gap: 8 (ref 5–15)
BUN: 14 mg/dL (ref 8–23)
CO2: 27 mmol/L (ref 22–32)
Calcium: 9 mg/dL (ref 8.9–10.3)
Chloride: 105 mmol/L (ref 98–111)
Creatinine, Ser: 0.8 mg/dL (ref 0.44–1.00)
GFR, Estimated: >60 mL/min (ref >60)
Glucose, Bld: 102 mg/dL — ABNORMAL HIGH (ref 70–99)
Potassium: 4 mmol/L (ref 3.5–5.1)
Sodium: 140 mmol/L (ref 135–145)

## 2022-03-09 LAB — SURGICAL PATHOLOGY

## 2022-03-09 MED ORDER — DOXYCYCLINE HYCLATE 100 MG PO TABS
100.0000 mg | ORAL_TABLET | Freq: Two times a day (BID) | ORAL | 0 refills | Status: DC
  Filled 2022-03-09 (×2): qty 28, 14d supply, fill #0

## 2022-03-09 MED ORDER — AMOXICILLIN-POT CLAVULANATE 875-125 MG PO TABS
1.0000 | ORAL_TABLET | Freq: Two times a day (BID) | ORAL | 0 refills | Status: DC
  Filled 2022-03-09: qty 28, 14d supply, fill #0

## 2022-03-09 NOTE — Discharge Summary (Addendum)
Physician Discharge Summary  Lauren [name] LZJ:[id number] DOB: 1945/12/09 DOA: 03/06/2022  PCP: Biagio Borg, MD  Admit date: 03/06/2022 Discharge date: 03/09/2022  Admitted From: Home Disposition:  Home  Recommendations for Outpatient Follow-up:  Follow up with podiatrist in 1-2 weeks Please obtain BMP/CBC in one week   Home Health:No Equipment/Devices:None Discharge Condition:Stable CODE STATUS:Full Diet recommendation: Heart Healthy  Brief/Interim Summary:  76 y.o. female past medical history of carotid stenosis, essential hypertension and psoriasis comes in complaining of right foot pain edema and erythema, she underwent 1/5 metatarsal resection on 02/10/2022 seen in the office on 11/30/2021 started on Doxy she relates her symptoms got worst she came into the ED    Discharge Diagnoses:  Principal Problem:   Acute osteomyelitis of metatarsal bone of right foot ([employer]) Active Problems:   Hypokalemia   Essential hypertension   depression and anxiety    Insomnia   Hypothyroidism   GERD (gastroesophageal reflux disease)   Carotid stenosis   Anxiety  Acute osteomyelitis and abscess of the metatarsal bone of the right foot: She had no leukocytosis afebrile CRP and were unremarkable. Podiatry was consulted and due to imaging results they recommended surgical intervention blood cultures and surgical cultures were sent. Podiatrist discussed with ID who recommended Augmentin and Doxy for 2 weeks and follow-up with ID as an outpatient.  Hypokalemia: Replete orally now resolved likely due to hydrochlorothiazide.  Essential hypertension: No change made to her medication.  Depression and anxiety: Continue Xanax as needed.  Insomnia: Continue Restoril.  Discharge Instructions  Discharge Instructions     Diet - low sodium heart healthy   Complete by: As directed    Increase activity slowly   Complete by: As directed    No wound care   Complete by: As directed        Allergies as of 03/09/2022       Reactions   Celebrex [celecoxib] Itching, Rash        Medication List     TAKE these medications    acetaminophen-codeine 300-30 MG tablet Commonly known as: TYLENOL #3 Take 1-2 tablets by mouth every 6 (six) hours as needed for moderate pain.   ALPRAZolam 1 MG tablet Commonly known as: XANAX TAKE 1/2 TO 1 TABLET BY MOUTH TWICE DAILY AS NEEDED What changed:  how much to take how to take this when to take this reasons to take this additional instructions   amLODipine 5 MG tablet Commonly known as: NORVASC TAKE 1 TABLET BY MOUTH DAILY   amoxicillin-clavulanate 875-125 MG tablet Commonly known as: AUGMENTIN Take 1 tablet by mouth every 12 (twelve) hours for 14 days.   aspirin EC 325 MG tablet Take 325 mg by mouth daily.   cyanocobalamin 100 MCG tablet Take 100 mcg by mouth daily.   D3 ADULT PO Take 1 tablet by mouth daily.   doxycycline 100 MG tablet Commonly known as: VIBRA-TABS Take 1 tablet (100 mg total) by mouth every 12 (twelve) hours for 14 days. What changed: when to take this   famotidine 40 MG tablet Commonly known as: PEPCID Take 40 mg by mouth daily.   hydrochlorothiazide 12.5 MG capsule Commonly known as: MICROZIDE TAKE 1 CAPSULE BY MOUTH  DAILY   ibuprofen 800 MG tablet Commonly known as: ADVIL TAKE 1 TABLET(800 MG) BY MOUTH TWICE DAILY AS NEEDED What changed: See the new instructions.   levothyroxine 50 MCG tablet Commonly known as: SYNTHROID TAKE 1 TABLET BY MOUTH DAILY   oxyCODONE-acetaminophen 5-325  MG tablet Commonly known as: PERCOCET/ROXICET Take 1 tablet by mouth every 4 (four) hours as needed for up to 5 days for severe pain.   Restasis 0.05 % ophthalmic emulsion Generic drug: cycloSPORINE Place 1 drop into both eyes 2 (two) times daily.   temazepam 15 MG capsule Commonly known as: RESTORIL 1-2 tab by mouth at bedtime as needed What changed:  how much to take how to take this when  to take this reasons to take this additional instructions   zolpidem 5 MG tablet Commonly known as: AMBIEN Take 1 tablet (5 mg total) by mouth at bedtime as needed. for sleep        Follow-up Information     Biagio Borg, MD Follow up.   Specialties: Internal Medicine, Radiology Contact information: [place] [address] [phone]                Allergies  Allergen Reactions   Celebrex [Celecoxib] Itching and Rash    Consultations: Orthopedic surgery   Procedures/Studies: DG Foot Complete Right  Result Date: 03/07/2022 CLINICAL DATA:  Postoperative check EXAM: RIGHT FOOT COMPLETE - 3+ VIEW COMPARISON:  Right foot radiographs 03/06/2022, MRI right forefoot 03/07/2022 FINDINGS: New further resection of a portion of the distal shaft of the 5th metatarsal. No definite resection of a portion of the proximal phalanx of the fifth toe where there was mild lateral proximal marrow edema on recent MRI earlier today. There is moderate soft tissue swelling within the distal lateral forefoot. Mild postoperative subcutaneous air. IMPRESSION: New further resection of a portion of the distal shaft of the 5th metatarsal, the region concerning for osteomyelitis on recent MRI. Electronically Signed   By: Yvonne Kendall M.D.   On: 03/07/2022 17:58   MR FOOT RIGHT WO CONTRAST  Result Date: 03/07/2022 CLINICAL DATA:  Pain, swelling and redness involving the fifth metatarsal. Recent amputation of the fifth metatarsal head. EXAM: MRI OF THE RIGHT FOREFOOT WITHOUT CONTRAST TECHNIQUE: Multiplanar, multisequence MR imaging of the right foot was performed. No intravenous contrast was administered. COMPARISON:  Radiographs 03/06/2022 FINDINGS: Abnormal T1 and T2 signal intensity involving the amputation site. There is surrounding soft tissue swelling/edema/fluid. Suspect small abscess along the plantar aspect. Findings are highly suspicious for osteomyelitis. There is also a  small focus of signal abnormality/marrow edema involving the proximal phalanx of the fifth toe which could be an early site of osteomyelitis. The other bony structures are intact. There is diffuse cellulitis and myofasciitis without definite findings for pyomyositis. IMPRESSION: 1. MR findings consistent with osteomyelitis involving the fifth metatarsal resection site. Surrounding fluid collections worrisome for small abscesses. 2. Suspect small focus of early osteomyelitis involving the proximal phalanx of the fifth toe also. 3. Diffuse cellulitis and myofasciitis. These results will be called to the ordering clinician or representative by the Radiologist Assistant, and communication documented in the PACS or [employer]. Electronically Signed   By: Marijo Sanes M.D.   On: 03/07/2022 08:13   DG Foot Complete Right  Result Date: 03/06/2022 CLINICAL DATA:  Postoperative pain and redness of fifth metatarsal. EXAM: RIGHT FOOT COMPLETE - 3+ VIEW COMPARISON:  Right foot radiographs 03/02/2022 FINDINGS: Redemonstration of amputation of the distal shaft and head of fifth metatarsal. There is moderate regional soft tissue swelling, similar to 03/02/2022. The amputation site appears sharp. Slightly decreased bone mineralization compared to 03/02/2022 areas secondary to postoperative edema. Mild second through fifth interphalangeal joint space narrowing. No acute fracture dislocation. IMPRESSION: Status  post amputation of the distal shaft and head of the fifth metatarsal with moderate regional soft tissue swelling, similar to 03/02/2022. Slightly decreased bone mineralization at the distal amputation site, however no definitive cortical erosion is seen at this time. Please note that recent postoperative state and radiograph modality limit evaluation for acute osteomyelitis. If there is persistent clinical concern for acute osteomyelitis, note is made that MRI would be more sensitive/specific. Electronically Signed    By: Yvonne Kendall M.D.   On: 03/06/2022 21:00   DG Foot 2 Views Right  Result Date: 03/06/2022 Please see detailed radiograph report in office note.  (Echo, Carotid, EGD, Colonoscopy, ERCP)    Subjective: No complaints  Discharge Exam: Vitals:   03/09/22 0518 03/09/22 0840  BP: 123/60 130/60  Pulse: 79 82  Resp: 15 16  Temp:  98.3 F (36.8 C)  SpO2: 94% 95%   Vitals:   03/08/22 1455 03/08/22 2008 03/09/22 0518 03/09/22 0840  BP: (!) 144/64 (!) 144/70 123/60 130/60  Pulse: 95 93 79 82  Resp:  '16 15 16  '$ Temp: 98.6 F (37 C) 98.6 F (37 C)  98.3 F (36.8 C)  TempSrc: Oral Oral  Oral  SpO2: 98% 98% 94% 95%  Weight:      Height:        General: Pt is alert, awake, not in acute distress Cardiovascular: RRR, S1/S2 +, no rubs, no gallops Respiratory: CTA bilaterally, no wheezing, no rhonchi Abdominal: Soft, NT, ND, bowel sounds + Extremities: no edema, no cyanosis    The results of significant diagnostics from this hospitalization (including imaging, microbiology, ancillary and laboratory) are listed below for reference.     Microbiology: Recent Results (from the past 240 hour(s))  Blood culture (routine x 2)     Status: None (Preliminary result)   Collection Time: 03/07/22 11:37 AM   Specimen: BLOOD  Result Value Ref Range Status   Specimen Description BLOOD RIGHT ANTECUBITAL  Final   Special Requests   Final    BOTTLES DRAWN AEROBIC AND ANAEROBIC Blood Culture adequate volume   Culture   Final    NO GROWTH 2 DAYS Performed at [facility], [address], [place], [place] [address]    Report Status PENDING  Incomplete  Blood culture (routine x 2)     Status: None (Preliminary result)   Collection Time: 03/07/22 11:42 AM   Specimen: BLOOD  Result Value Ref Range Status   Specimen Description BLOOD LEFT ANTECUBITAL  Final   Special Requests   Final    BOTTLES DRAWN AEROBIC AND ANAEROBIC Blood Culture results may not be optimal due to an excessive  volume of blood received in culture bottles   Culture   Final    NO GROWTH 2 DAYS Performed at [facility], [place] [address] [place], [place] [address]    Report Status PENDING  Incomplete  Aerobic/Anaerobic Culture w Gram Stain (surgical/deep wound)     Status: None (Preliminary result)   Collection Time: 03/07/22  4:46 PM   Specimen: Wound  Result Value Ref Range Status   Specimen Description WOUND RIGHT FOOT  Final   Special Requests SAMPLE A  Final   Gram Stain PENDING  Incomplete   Culture   Final    NO GROWTH 2 DAYS Performed at [facility], [address], [place], [place] [address]    Report Status PENDING  Incomplete  Aerobic/Anaerobic Culture w Gram Stain (surgical/deep wound)  Status: None (Preliminary result)   Collection Time: 03/07/22  4:48 PM   Specimen: Bone; Tissue  Result Value Ref Range Status   Specimen Description BONE  Final   Special Requests FROM 5TH METATARSAL SAMPLE B  Final   Gram Stain PENDING  Incomplete   Culture   Final    NO GROWTH 2 DAYS Performed at [facility], [address] [place], [place] [address]    Report Status PENDING  Incomplete  Aerobic/Anaerobic Culture w Gram Stain (surgical/deep wound)     Status: None (Preliminary result)   Collection Time: 03/07/22  4:53 PM   Specimen: Bone; Tissue  Result Value Ref Range Status   Specimen Description BONE  Final   Special Requests RT 5TH TOE SAMPLE C  Final   Gram Stain PENDING  Incomplete   Culture   Final    NO GROWTH 2 DAYS Performed at [facility], [place] [address] [place], [place] [address]    Report Status PENDING  Incomplete  Aerobic/Anaerobic Culture w Gram Stain (surgical/deep wound)     Status: None (Preliminary result)   Collection Time: 03/07/22  4:54 PM   Specimen: Bone; Tissue  Result Value Ref Range Status   Specimen Description BONE  Final   Special Requests RT 5TH TOE SAMPLE D  Final   Gram Stain PENDING  Incomplete   Culture   Final     NO GROWTH 2 DAYS Performed at [facility], [place] [address], [place], [place] [address]    Report Status PENDING  Incomplete     Labs: BNP (last 3 results) No results for input(s): "BNP" in the last 8760 hours. Basic Metabolic Panel: Recent Labs  Lab 03/07/22 1243 03/08/22 0329 03/09/22 0311  NA 142 142 140  K 3.1* 3.5 4.0  CL 104 103 105  CO2 '26 29 27  '$ GLUCOSE 88 104* 102*  BUN '12 9 14  '$ CREATININE 0.67 0.71 0.80  CALCIUM 9.8 9.5 9.0  MG 2.2  --   --    Liver Function Tests: Recent Labs  Lab 03/07/22 1243  AST 18  ALT 16  ALKPHOS 78  BILITOT 1.0  PROT 7.3  ALBUMIN 4.2   No results for input(s): "LIPASE", "AMYLASE" in the last 168 hours. No results for input(s): "AMMONIA" in the last 168 hours. CBC: Recent Labs  Lab 03/07/22 1243 03/08/22 0329  WBC 7.2 6.3  NEUTROABS 4.8  --   HGB 13.5 11.8*  HCT 40.2 34.9*  MCV 95.3 94.1  PLT 246 211   Cardiac Enzymes: No results for input(s): "CKTOTAL", "CKMB", "CKMBINDEX", "TROPONINI" in the last 168 hours. BNP: Invalid input(s): "POCBNP" CBG: No results for input(s): "GLUCAP" in the last 168 hours. D-Dimer No results for input(s): "DDIMER" in the last 72 hours. Hgb A1c No results for input(s): "HGBA1C" in the last 72 hours. Lipid Profile No results for input(s): "CHOL", "HDL", "LDLCALC", "TRIG", "CHOLHDL", "LDLDIRECT" in the last 72 hours. Thyroid function studies No results for input(s): "TSH", "T4TOTAL", "T3FREE", "THYROIDAB" in the last 72 hours.  Invalid input(s): "FREET3" Anemia work up No results for input(s): "VITAMINB12", "FOLATE", "FERRITIN", "TIBC", "IRON", "RETICCTPCT" in the last 72 hours. Urinalysis    Component Value Date/Time   COLORURINE YELLOW 04/26/2021 1105   APPEARANCEUR CLEAR 04/26/2021 1105   APPEARANCEUR Clear 10/26/2017 0724   LABSPEC 1.015 04/26/2021 1105   PHURINE 6.5 04/26/2021 1105   GLUCOSEU NEGATIVE 04/26/2021 1105   [place] 04/26/2021 1105  BILIRUBINUR  NEGATIVE 04/26/2021 1105   BILIRUBINUR Negative 10/26/2017 0724   KETONESUR NEGATIVE 04/26/2021 1105   PROTEINUR NEGATIVE 03/22/2019 1910   UROBILINOGEN 0.2 04/26/2021 1105   NITRITE NEGATIVE 04/26/2021 1105   LEUKOCYTESUR NEGATIVE 04/26/2021 1105   Sepsis Labs Recent Labs  Lab 03/07/22 1243 03/08/22 0329  WBC 7.2 6.3   Microbiology Recent Results (from the past 240 hour(s))  Blood culture (routine x 2)     Status: None (Preliminary result)   Collection Time: 03/07/22 11:37 AM   Specimen: BLOOD  Result Value Ref Range Status   Specimen Description BLOOD RIGHT ANTECUBITAL  Final   Special Requests   Final    BOTTLES DRAWN AEROBIC AND ANAEROBIC Blood Culture adequate volume   Culture   Final    NO GROWTH 2 DAYS Performed at [facility], [place] [address] [place], [place] [address]    Report Status PENDING  Incomplete  Blood culture (routine x 2)     Status: None (Preliminary result)   Collection Time: 03/07/22 11:42 AM   Specimen: BLOOD  Result Value Ref Range Status   Specimen Description BLOOD LEFT ANTECUBITAL  Final   Special Requests   Final    BOTTLES DRAWN AEROBIC AND ANAEROBIC Blood Culture results may not be optimal due to an excessive volume of blood received in culture bottles   Culture   Final    NO GROWTH 2 DAYS Performed at [facility], [place] [address], [place], [place] [address]    Report Status PENDING  Incomplete  Aerobic/Anaerobic Culture w Gram Stain (surgical/deep wound)     Status: None (Preliminary result)   Collection Time: 03/07/22  4:46 PM   Specimen: Wound  Result Value Ref Range Status   Specimen Description WOUND RIGHT FOOT  Final   Special Requests SAMPLE A  Final   Gram Stain PENDING  Incomplete   Culture   Final    NO GROWTH 2 DAYS Performed at [facility], [address] [place], [place] [address]    Report Status PENDING  Incomplete  Aerobic/Anaerobic Culture w Gram Stain (surgical/deep wound)     Status: None  (Preliminary result)   Collection Time: 03/07/22  4:48 PM   Specimen: Bone; Tissue  Result Value Ref Range Status   Specimen Description BONE  Final   Special Requests FROM 5TH METATARSAL SAMPLE B  Final   Gram Stain PENDING  Incomplete   Culture   Final    NO GROWTH 2 DAYS Performed at [facility], [place] [address] [place], [place] [address]    Report Status PENDING  Incomplete  Aerobic/Anaerobic Culture w Gram Stain (surgical/deep wound)     Status: None (Preliminary result)   Collection Time: 03/07/22  4:53 PM   Specimen: Bone; Tissue  Result Value Ref Range Status   Specimen Description BONE  Final   Special Requests RT 5TH TOE SAMPLE C  Final   Gram Stain PENDING  Incomplete   Culture   Final    NO GROWTH 2 DAYS Performed at [facility], [place] [address], [place], [place] [address]    Report Status PENDING  Incomplete  Aerobic/Anaerobic Culture w Gram Stain (surgical/deep wound)     Status: None (Preliminary result)   Collection Time: 03/07/22  4:54 PM   Specimen: Bone; Tissue  Result Value Ref Range Status   Specimen Description BONE  Final   Special Requests RT 5TH TOE SAMPLE D  Final   Gram Stain PENDING  Incomplete   Culture   Final    NO GROWTH 2 DAYS Performed at [facility], [place] [address], [place], [place] [address]    Report Status PENDING  Incomplete    SIGNED:   Charlynne Cousins, MD  [facility] 03/09/2022, 12:23 PM Pager   If 7PM-7AM, please contact night-coverage [email or web address] Password [email or web address]

## 2022-03-09 NOTE — Plan of Care (Signed)

## 2022-03-09 NOTE — Plan of Care (Signed)

## 2022-03-10 ENCOUNTER — Telehealth: Payer: Self-pay

## 2022-03-10 NOTE — Telephone Encounter (Signed)
Transition Care Management Follow-up Telephone Call Date of discharge and from where: [facility] 03/09/2022 How have you been since you were released from the hospital? good Any questions or concerns? Yes  Items Reviewed: Did the pt receive and understand the discharge instructions provided? Yes  Medications obtained and verified? Yes  Other? No  Any new allergies since your discharge? No  Dietary orders reviewed? Yes Do you have support at home? Yes   Home Care and Equipment/Supplies: Were home health services ordered? no If so, what is the name of the agency? N/N  Has the agency set up a time to come to the patient's home? not applicable Were any new equipment or medical supplies ordered?  No What is the name of the medical supply agency? N/A Were you able to get the supplies/equipment? not applicable Do you have any questions related to the use of the equipment or supplies? No  Functional Questionnaire: (I = Independent and D = Dependent) ADLs: I  Bathing/Dressing- I  Meal Prep- I  Eating- I  Maintaining continence- I  Transferring/Ambulation- I  Managing Meds- I  Follow up appointments reviewed:  PCP Hospital f/u appt confirmed? Yes  Scheduled to see Dr Jenny Reichmann on 03/13/2022 @ 2:40. [facility] f/u appt confirmed? Yes  Scheduled to see Dr Paulla Dolly on 03/16/2022 @ 9:15. Are transportation arrangements needed? No  If their condition worsens, is the pt aware to call PCP or go to the Emergency Dept.? Yes Was the patient provided with contact information for the PCP's office or ED? Yes Was to pt encouraged to call back with questions or concerns? Yes Juanda Crumble, LPN [place] Direct Dial [phone]

## 2022-03-11 ENCOUNTER — Other Ambulatory Visit: Payer: Self-pay | Admitting: Orthopaedic Surgery

## 2022-03-12 LAB — AEROBIC/ANAEROBIC CULTURE W GRAM STAIN (SURGICAL/DEEP WOUND)
Culture: NO GROWTH
Culture: NO GROWTH
Culture: NO GROWTH
Culture: NO GROWTH
Gram Stain: NONE SEEN
Gram Stain: NONE SEEN
Gram Stain: NONE SEEN
Gram Stain: NONE SEEN

## 2022-03-12 LAB — CULTURE, BLOOD (ROUTINE X 2)
Culture: NO GROWTH
Culture: NO GROWTH
Special Requests: ADEQUATE

## 2022-03-13 ENCOUNTER — Encounter: Payer: Self-pay | Admitting: Internal Medicine

## 2022-03-13 ENCOUNTER — Ambulatory Visit (INDEPENDENT_AMBULATORY_CARE_PROVIDER_SITE_OTHER): Payer: Medicare Other | Admitting: Internal Medicine

## 2022-03-13 DIAGNOSIS — M86171 Other acute osteomyelitis, right ankle and foot: Secondary | ICD-10-CM | POA: Diagnosis not present

## 2022-03-13 NOTE — Patient Instructions (Signed)
Please continue all other medications as before, and refills have been done if requested.  Please have the pharmacy call with any other refills you may need.  Please continue your efforts at being more active, low cholesterol diet, and weight control.  Please keep your appointments with your specialists as you may have planned - podiatry and ID

## 2022-03-13 NOTE — Progress Notes (Signed)
Patient ID: Lauren [name], female   DOB: February 12, 1946, 76 y.o.   MRN: [id number]        Chief Complaint: follow up post hospn oct 16 - oct 19       HPI:  Lauren [name] is a 76 y.o. female here with above, complaining of right foot pain edema and erythema, she underwent 1/5 metatarsal resection on 02/10/2022 seen in the office on 11/30/2021 started on Doxy she relates her symptoms got worst she came into the ED  She had no leukocytosis afebrile CRP and were unremarkable.  Podiatry was consulted and due to imaging results they recommended surgical intervention blood cultures and surgical cultures were sent.  Podiatrist discussed with ID who recommended Augmentin and Doxy for 2 weeks and follow-up with ID as an outpatient  Pt questions why as her cultures are negative.  Did finish dificid course.  Has no new complaints.  Right foot is in boot, and plans to f/u with podiatry  Transitional Care Management elements noted today: 1)  Date of D/C: as above 2)  Medication reconciliation:  done today at end visit 3)  Review of D/C summary or other information:  done today 4)  Review of need for f/u on pending diagnostic tests and treatments:  done today 5)  Review of need for Interaction with other providers who will assume or resume care of pt specific problems: done today 6)  Education of patient/family/guardian or caregiver: none needed  Wt Readings from Last 3 Encounters:  03/13/22 141 lb (64 kg)  03/07/22 142 lb (64.4 kg)  12/23/21 141 lb (64 kg)   BP Readings from Last 3 Encounters:  03/13/22 130/68  03/09/22 130/60  12/23/21 126/72         Past Medical History:  Diagnosis Date   Anxiety    Arthritis    Carotid stenosis 10/08/2013   Cervical disc disease    Depression    Diverticulitis    Diverticulosis    Frequent headaches    Hypertension    does not now per pt.   Hypothyroidism 10/26/2015   Impaired glucose tolerance 10/08/2013   normal HgA1C   Neck pain    Psoriasis    Past  Surgical History:  Procedure Laterality Date   BREAST BIOPSY Left 2011   benign    CERVICAL FUSION     approx 1980   CHOLECYSTECTOMY     COLONOSCOPY     I & D EXTREMITY Right 03/07/2022   Procedure: IRRIGATION AND DEBRIDEMENT OF FOOT WITH BONE BIOPSY;  Surgeon: Trula Slade, DPM;  Location: [facility];  Service: Podiatry;  Laterality: Right;   POSTERIOR CERVICAL FUSION/FORAMINOTOMY N/A 11/05/2014   Procedure: Cervical four to Cervical seven  Posterior Cervical Fusion with lateral mass fixation;  Surgeon: Eustace Moore, MD;  Location: [facility];  Service: Neurosurgery;  Laterality: N/A;   C4 - C7 Posterior Cervical Fusion with lateral mass fixation   TOTAL ABDOMINAL HYSTERECTOMY  1998   Fibroid   UPPER GASTROINTESTINAL ENDOSCOPY      reports that she has never smoked. She has never used smokeless tobacco. She reports current alcohol use. She reports that she does not use drugs. family history includes Alcohol abuse in an other family member; Alcoholism in her father; Arthritis in an other family member; Cancer in an other family member; Cervical cancer in her mother; Colon cancer in her father; Diabetes in her brother and mother; Heart attack in her mother; Heart disease in  her mother and another family member; Hypertension in an other family member; Lung cancer in her father; Throat cancer in her brother. Allergies  Allergen Reactions   Celebrex [Celecoxib] Itching and Rash   Current Outpatient Medications on File Prior to Visit  Medication Sig Dispense Refill   acetaminophen-codeine (TYLENOL #3) 300-30 MG tablet Take 1-2 tablets by mouth every 6 (six) hours as needed for moderate pain. 15 tablet 0   ALPRAZolam (XANAX) 1 MG tablet TAKE 1/2 TO 1 TABLET BY MOUTH TWICE DAILY AS NEEDED (Patient taking differently: Take 0.5-1 mg by mouth 2 (two) times daily as needed for anxiety.) 60 tablet 0   amLODipine (NORVASC) 5 MG tablet TAKE 1 TABLET BY MOUTH DAILY (Patient taking differently: Take 5  mg by mouth daily.) 100 tablet 2   amoxicillin-clavulanate (AUGMENTIN) 875-125 MG tablet Take 1 tablet by mouth every 12 (twelve) hours for 14 days. 28 tablet 0   aspirin EC 325 MG tablet Take 325 mg by mouth daily.     Cholecalciferol (D3 ADULT PO) Take 1 tablet by mouth daily.     cyanocobalamin 100 MCG tablet Take 100 mcg by mouth daily.     doxycycline (VIBRA-TABS) 100 MG tablet Take 1 tablet (100 mg total) by mouth every 12 (twelve) hours for 14 days. 28 tablet 0   famotidine (PEPCID) 40 MG tablet Take 40 mg by mouth daily.     hydrochlorothiazide (MICROZIDE) 12.5 MG capsule TAKE 1 CAPSULE BY MOUTH  DAILY (Patient taking differently: Take 12.5 mg by mouth daily.) 90 capsule 2   ibuprofen (ADVIL) 800 MG tablet TAKE 1 TABLET(800 MG) BY MOUTH TWICE DAILY AS NEEDED (Patient taking differently: Take 800 mg by mouth 2 (two) times daily as needed for moderate pain.) 60 tablet 1   levothyroxine (SYNTHROID) 50 MCG tablet TAKE 1 TABLET BY MOUTH DAILY (Patient taking differently: Take 50 mcg by mouth daily.) 100 tablet 2   RESTASIS 0.05 % ophthalmic emulsion Place 1 drop into both eyes 2 (two) times daily.     temazepam (RESTORIL) 15 MG capsule 1-2 tab by mouth at bedtime as needed (Patient taking differently: Take 15-30 mg by mouth at bedtime as needed for sleep.) 60 capsule 1   zolpidem (AMBIEN) 5 MG tablet Take 1 tablet (5 mg total) by mouth at bedtime as needed. for sleep 90 tablet 1   No current facility-administered medications on file prior to visit.        ROS:  All others reviewed and negative.  Objective        PE:  BP 130/68 (BP Location: Right Arm, Patient Position: Sitting, Cuff Size: Large)   Pulse 76   Temp 98.2 F (36.8 C) (Oral)   Ht '5\' 4"'$  (1.626 m)   Wt 141 lb (64 kg)   LMP 05/22/1996   SpO2 97%   BMI 24.20 kg/m                 Constitutional: Pt appears in NAD               HENT: Head: NCAT.                Right Ear: External ear normal.                 Left Ear:  External ear normal.                Eyes: . Pupils are equal, round, and reactive to light. Conjunctivae and EOM are  normal               Nose: without d/c or deformity               Neck: Neck supple. Gross normal ROM               Cardiovascular: Normal rate and regular rhythm.                 Pulmonary/Chest: Effort normal and breath sounds without rales or wheezing.                Abd:  Soft, NT, ND, + BS, no organomegaly               Neurological: Pt is alert. At baseline orientation, motor grossly intact               Skin: Skin is warm. No rashes, no other new lesions, LE edema - none               Psychiatric: Pt behavior is normal without agitation   Micro: none  Cardiac tracings I have personally interpreted today:  none  Pertinent Radiological findings (summarize): none   Lab Results  Component Value Date   WBC 6.3 03/08/2022   HGB 11.8 (L) 03/08/2022   HCT 34.9 (L) 03/08/2022   PLT 211 03/08/2022   GLUCOSE 102 (H) 03/09/2022   CHOL 155 04/26/2021   TRIG 240.0 (H) 04/26/2021   HDL 29.70 (L) 04/26/2021   LDLDIRECT 75.0 04/26/2021   LDLCALC 90 10/26/2017   ALT 16 03/07/2022   AST 18 03/07/2022   NA 140 03/09/2022   K 4.0 03/09/2022   CL 105 03/09/2022   CREATININE 0.80 03/09/2022   BUN 14 03/09/2022   CO2 27 03/09/2022   TSH 3.81 04/26/2021   INR 0.9 07/16/2021   HGBA1C 5.9 04/26/2021   Assessment/Plan:  Lauren [name] is a 76 y.o. White or Caucasian [1] female with  has a past medical history of Anxiety, Arthritis, Carotid stenosis (10/08/2013), Cervical disc disease, Depression, Diverticulitis, Diverticulosis, Frequent headaches, Hypertension, Hypothyroidism (10/26/2015), Impaired glucose tolerance (10/08/2013), Neck pain, and Psoriasis.  Acute osteomyelitis of metatarsal bone of right foot (HCC) Cultures negative, pt to f/u with ID and podiatry as planned, delcines further lab today  Followup: Return if symptoms worsen or fail to improve.  Cathlean Cower, MD  03/13/2022 7:15 PM [facility]

## 2022-03-13 NOTE — Assessment & Plan Note (Signed)
Cultures negative, pt to f/u with ID and podiatry as planned, delcines further lab today

## 2022-03-16 ENCOUNTER — Ambulatory Visit (INDEPENDENT_AMBULATORY_CARE_PROVIDER_SITE_OTHER): Payer: Medicare Other

## 2022-03-16 ENCOUNTER — Encounter: Payer: Self-pay | Admitting: Podiatry

## 2022-03-16 ENCOUNTER — Ambulatory Visit (INDEPENDENT_AMBULATORY_CARE_PROVIDER_SITE_OTHER): Payer: Medicare Other | Admitting: Podiatry

## 2022-03-16 ENCOUNTER — Telehealth: Payer: Self-pay | Admitting: Internal Medicine

## 2022-03-16 DIAGNOSIS — Z9889 Other specified postprocedural states: Secondary | ICD-10-CM | POA: Diagnosis not present

## 2022-03-16 NOTE — Telephone Encounter (Signed)
Patient would like for Dr. Jenny Reichmann to order her blood work.  Patient states that her paperwork said that she declined having blood work here and patient states that she did not.  Please call and let her know when the order are in.

## 2022-03-16 NOTE — Telephone Encounter (Signed)
Patient has been scheduled for tomorrow 03/17/22 at 7:30am

## 2022-03-17 ENCOUNTER — Encounter: Payer: Self-pay | Admitting: Podiatry

## 2022-03-17 ENCOUNTER — Ambulatory Visit (INDEPENDENT_AMBULATORY_CARE_PROVIDER_SITE_OTHER): Payer: Medicare Other | Admitting: Podiatry

## 2022-03-17 DIAGNOSIS — Z9889 Other specified postprocedural states: Secondary | ICD-10-CM

## 2022-03-17 NOTE — Progress Notes (Signed)
Subjective:   Patient ID: Lauren [name], female   DOB: 76 y.o.   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  Locally it looks excellent with no erythema edema drainage and incision that has healed completely currently     Assessment:  Patient appears to be doing much better with no erythema edema drainage and incision that is healed well     Plan:  Stitches are removed wound edges coapted well and she may begin doing careful soaks around the area but I do not want her putting anything on the incision itself.  She can continue to use compression I did explain that it is normal to be sore at this point and that may last for several months to complete resolution but she may slowly start to wear shoes that are comfortable for.  All questions answered call if any issues were to occur  X-rays indicate that there is satisfactory resection of bone everything appears to be in good position at this point

## 2022-03-17 NOTE — Progress Notes (Signed)
Subjective:   Patient ID: Lauren [name], female   DOB: 76 y.o.   MRN: [id number]   HPI Patient presents very upset because when she soaked her foot yesterday she noted some drainage.  Its not draining today its not tender but she was very emotional and concerned about this   ROS      Objective:  Physical Exam  Neurovascular status intact negative Bevelyn Buckles' sign noted 4 weeks after having foot surgery right.  The stitches removed yesterday I evaluated the incision today found to be well coapted with no gapping or drainage noted and it could be when she soaked it yesterday just created some stress.  There is mild edema consistent with 4 weeks after surgery there is no proximal erythema edema drainage noted and minimal tenderness with palpation.     Assessment:  From a physical standpoint everything appears to be well the patient is very nervous and very emotional and very concerned about her foot even though she was unable to articulate exactly what her concerns are except for the fact that she had a short period of drainage when she soaked yesterday and that ended     Plan:  I tried to discuss with her that she is only 4 weeks after surgery and it is normal to still have some low-grade discomfort with this and that the incision site looks well but I was unable to allay her fears.  I went ahead I applied Steri-Strips and a full sterile dressing to the area and she wants to did see another physician next week.  She will see Dr. Earleen Newport next Tuesday and again I tried to allay her fears to the best of my ability but was unfortunately unable to do this.  Encouraged her to call if any questions concerns should occur prior to that date

## 2022-03-20 ENCOUNTER — Other Ambulatory Visit: Payer: Self-pay | Admitting: Podiatry

## 2022-03-20 DIAGNOSIS — M21621 Bunionette of right foot: Secondary | ICD-10-CM

## 2022-03-20 NOTE — Telephone Encounter (Signed)
Future labs were ordered previously, patient coming in tomorrow for labs.

## 2022-03-21 ENCOUNTER — Inpatient Hospital Stay: Payer: Medicare Other | Admitting: Infectious Diseases

## 2022-03-21 ENCOUNTER — Other Ambulatory Visit (INDEPENDENT_AMBULATORY_CARE_PROVIDER_SITE_OTHER): Payer: Medicare Other

## 2022-03-21 DIAGNOSIS — E7800 Pure hypercholesterolemia, unspecified: Secondary | ICD-10-CM | POA: Diagnosis not present

## 2022-03-21 DIAGNOSIS — L814 Other melanin hyperpigmentation: Secondary | ICD-10-CM | POA: Diagnosis not present

## 2022-03-21 DIAGNOSIS — L821 Other seborrheic keratosis: Secondary | ICD-10-CM | POA: Diagnosis not present

## 2022-03-21 DIAGNOSIS — E559 Vitamin D deficiency, unspecified: Secondary | ICD-10-CM | POA: Diagnosis not present

## 2022-03-21 DIAGNOSIS — R739 Hyperglycemia, unspecified: Secondary | ICD-10-CM | POA: Diagnosis not present

## 2022-03-21 DIAGNOSIS — E538 Deficiency of other specified B group vitamins: Secondary | ICD-10-CM | POA: Diagnosis not present

## 2022-03-21 DIAGNOSIS — D485 Neoplasm of uncertain behavior of skin: Secondary | ICD-10-CM | POA: Diagnosis not present

## 2022-03-21 DIAGNOSIS — K130 Diseases of lips: Secondary | ICD-10-CM | POA: Diagnosis not present

## 2022-03-21 LAB — URINALYSIS, ROUTINE W REFLEX MICROSCOPIC
Bilirubin Urine: NEGATIVE
Hgb urine dipstick: NEGATIVE
Ketones, ur: NEGATIVE
Leukocytes,Ua: NEGATIVE
Nitrite: NEGATIVE
Specific Gravity, Urine: 1.02 (ref 1.000–1.030)
Total Protein, Urine: NEGATIVE
Urine Glucose: NEGATIVE
Urobilinogen, UA: 0.2 (ref 0.0–1.0)
pH: 6 (ref 5.0–8.0)

## 2022-03-21 LAB — CBC WITH DIFFERENTIAL/PLATELET
Basophils Absolute: 0 10*3/uL (ref 0.0–0.1)
Basophils Relative: 0.5 % (ref 0.0–3.0)
Eosinophils Absolute: 0 10*3/uL (ref 0.0–0.7)
Eosinophils Relative: 0.4 % (ref 0.0–5.0)
HCT: 37.2 % (ref 36.0–46.0)
Hemoglobin: 12.4 g/dL (ref 12.0–15.0)
Lymphocytes Relative: 22.5 % (ref 12.0–46.0)
Lymphs Abs: 1.7 10*3/uL (ref 0.7–4.0)
MCHC: 33.2 g/dL (ref 30.0–36.0)
MCV: 95.8 fl (ref 78.0–100.0)
Monocytes Absolute: 0.5 10*3/uL (ref 0.1–1.0)
Monocytes Relative: 6.5 % (ref 3.0–12.0)
Neutro Abs: 5.4 10*3/uL (ref 1.4–7.7)
Neutrophils Relative %: 70.1 % (ref 43.0–77.0)
Platelets: 255 10*3/uL (ref 150.0–400.0)
RBC: 3.88 Mil/uL (ref 3.87–5.11)
RDW: 14.1 % (ref 11.5–15.5)
WBC: 7.7 10*3/uL (ref 4.0–10.5)

## 2022-03-21 LAB — VITAMIN B12: Vitamin B-12: >1500 pg/mL — ABNORMAL HIGH (ref 211–911)

## 2022-03-21 LAB — LIPID PANEL
Cholesterol: 149 mg/dL (ref 0–200)
HDL: 39.1 mg/dL (ref >39.00)
LDL Cholesterol: 71 mg/dL (ref 0–99)
NonHDL: 110.19
Total CHOL/HDL Ratio: 4
Triglycerides: 196 mg/dL — ABNORMAL HIGH (ref 0.0–149.0)
VLDL: 39.2 mg/dL (ref 0.0–40.0)

## 2022-03-21 LAB — BASIC METABOLIC PANEL
BUN: 15 mg/dL (ref 6–23)
CO2: 29 mEq/L (ref 19–32)
Calcium: 9.5 mg/dL (ref 8.4–10.5)
Chloride: 102 mEq/L (ref 96–112)
Creatinine, Ser: 0.66 mg/dL (ref 0.40–1.20)
GFR: 84.82 mL/min (ref >60.00)
Glucose, Bld: 90 mg/dL (ref 70–99)
Potassium: 3.5 mEq/L (ref 3.5–5.1)
Sodium: 140 mEq/L (ref 135–145)

## 2022-03-21 LAB — HEPATIC FUNCTION PANEL
ALT: 13 U/L (ref 0–35)
AST: 18 U/L (ref 0–37)
Albumin: 4.2 g/dL (ref 3.5–5.2)
Alkaline Phosphatase: 73 U/L (ref 39–117)
Bilirubin, Direct: 0.1 mg/dL (ref 0.0–0.3)
Total Bilirubin: 0.5 mg/dL (ref 0.2–1.2)
Total Protein: 7.1 g/dL (ref 6.0–8.3)

## 2022-03-21 LAB — VITAMIN D 25 HYDROXY (VIT D DEFICIENCY, FRACTURES): VITD: 37.19 ng/mL (ref 30.00–100.00)

## 2022-03-21 LAB — HEMOGLOBIN A1C: Hgb A1c MFr Bld: 5.8 % (ref 4.6–6.5)

## 2022-03-21 LAB — TSH: TSH: 3.13 u[IU]/mL (ref 0.35–5.50)

## 2022-03-23 ENCOUNTER — Other Ambulatory Visit: Payer: Self-pay

## 2022-03-23 ENCOUNTER — Ambulatory Visit (INDEPENDENT_AMBULATORY_CARE_PROVIDER_SITE_OTHER): Payer: Medicare Other | Admitting: Infectious Diseases

## 2022-03-23 ENCOUNTER — Encounter: Payer: Self-pay | Admitting: Infectious Diseases

## 2022-03-23 ENCOUNTER — Inpatient Hospital Stay: Payer: Medicare Other | Admitting: Infectious Diseases

## 2022-03-23 VITALS — BP 125/85 | HR 77 | Resp 16 | Ht 64.0 in | Wt 137.0 lb

## 2022-03-23 DIAGNOSIS — M86171 Other acute osteomyelitis, right ankle and foot: Secondary | ICD-10-CM

## 2022-03-23 DIAGNOSIS — Z5181 Encounter for therapeutic drug level monitoring: Secondary | ICD-10-CM | POA: Diagnosis not present

## 2022-03-23 MED ORDER — AMOXICILLIN-POT CLAVULANATE 875-125 MG PO TABS: 1.0000 | ORAL_TABLET | Freq: Two times a day (BID) | ORAL | 0 refills | Status: AC

## 2022-03-23 MED ORDER — DOXYCYCLINE HYCLATE 100 MG PO TABS: 100.0000 mg | ORAL_TABLET | Freq: Two times a day (BID) | ORAL | 0 refills | Status: AC

## 2022-03-23 NOTE — Progress Notes (Addendum)
Patient Active Problem List   Diagnosis Date Noted   Acute osteomyelitis of metatarsal bone of right foot ([place]) 03/07/2022   Hypokalemia 03/07/2022   GERD (gastroesophageal reflux disease) 03/07/2022   Anterior neck pain 12/23/2021   Neck mass 12/23/2021   Cough 10/27/2021   Recurrent falls 09/04/2021   Headache 07/19/2021   Unilateral primary osteoarthritis, left knee 06/23/2020   Chondromalacia patellae, left knee 06/01/2020   Synovitis of left knee 01/14/2020   Hyperglycemia 12/23/2018   Hematuria 12/23/2018   Vitamin D deficiency 10/25/2017   Rash 05/24/2017   Obesity 05/24/2017   Hypothyroidism 10/26/2015   Peripheral edema 10/22/2015   Cervical dystonia 07/14/2015   Weakness generalized 11/16/2014   Insomnia 11/16/2014   S/P cervical spinal fusion 11/05/2014   Essential hypertension 10/14/2014   Fracture, cervical vertebra ([place]) 08/30/2014   Carotid stenosis 10/08/2013   depression and anxiety     Diverticulitis    Anxiety    Cervical disc disease    Diverticulitis of colon (without mention of hemorrhage)(562.11) 05/10/2013    Patient's Medications  New Prescriptions   No medications on file  Previous Medications   ACETAMINOPHEN-CODEINE (TYLENOL #3) 300-30 MG TABLET    Take 1-2 tablets by mouth every 6 (six) hours as needed for moderate pain.   ALPRAZOLAM (XANAX) 1 MG TABLET    TAKE 1/2 TO 1 TABLET BY MOUTH TWICE DAILY AS NEEDED   AMLODIPINE (NORVASC) 5 MG TABLET    TAKE 1 TABLET BY MOUTH DAILY   AMOXICILLIN-CLAVULANATE (AUGMENTIN) 875-125 MG TABLET    Take 1 tablet by mouth every 12 (twelve) hours for 14 days.   ASPIRIN EC 325 MG TABLET    Take 325 mg by mouth daily.   CHOLECALCIFEROL (D3 ADULT PO)    Take 1 tablet by mouth daily.   CYANOCOBALAMIN 100 MCG TABLET    Take 100 mcg by mouth daily.   DOXYCYCLINE (VIBRA-TABS) 100 MG TABLET    Take 1 tablet (100 mg total) by mouth every 12 (twelve) hours for 14 days.   FAMOTIDINE (PEPCID) 40 MG TABLET    Take 40 mg  by mouth daily.   HYDROCHLOROTHIAZIDE (MICROZIDE) 12.5 MG CAPSULE    TAKE 1 CAPSULE BY MOUTH  DAILY   IBUPROFEN (ADVIL) 800 MG TABLET    Take 1 tablet (800 mg total) by mouth 2 (two) times daily as needed for moderate pain.   LEVOTHYROXINE (SYNTHROID) 50 MCG TABLET    TAKE 1 TABLET BY MOUTH DAILY   RESTASIS 0.05 % OPHTHALMIC EMULSION    Place 1 drop into both eyes 2 (two) times daily.   TEMAZEPAM (RESTORIL) 15 MG CAPSULE    1-2 tab by mouth at bedtime as needed   ZOLPIDEM (AMBIEN) 5 MG TABLET    Take 1 tablet (5 mg total) by mouth at bedtime as needed. for sleep  Modified Medications   No medications on file  Discontinued Medications   No medications on file    Subjective: 76 Y O female with PMH as below including psoriasis including 5th metatarsal head resection on 02/10/2022 with podiatry, Dr. Paulla Dolly. She was initially doing well post operatively but 10/12 office visit had significant pain at the surgical site. She was started on doxycycline. Pain got worse over time and was admitted to the hospital 10/17- 10/19.  MRI rt foot 10/17 IMPRESSION: 1. MR findings consistent with osteomyelitis involving the fifth metatarsal resection site. Surrounding fluid collections worrisome for small abscesses. 2. Suspect small focus of  early osteomyelitis involving the proximal phalanx of the fifth toe also. 3. Diffuse cellulitis and myofasciitis.  10/17 s/o I and D and bone biopsy. OR notes with no concerns for remaining Osteomyelitis. Multiple cultures taken from rt 5th toe metatarsal no growth. Pathology of rt 5th metatarsal negative for osteomyelitis.   She was discharged on po doxycycline and po augmentin for 2 weeks which she has been taking until yesterday. Denies missing doses. Denies any fevers, chills. Denies nausea, vomiting and diarrhea. She had some oozing that started after stitches were removed last Thursday but no significant pain/tenderness/drainage. Seen by Podiatry Dr Paulla Dolly 10/27 where  wound was thought to be healing well with some oedema expected in post op state. However, patient is very concerned and states she wants to see another surgeon ( Dr Jacqualyn Posey) for second opinion.   Review of Systems: all systems reviewed with pertinent positives and negatives as listed above   Past Medical History:  Diagnosis Date   Anxiety    Arthritis    Carotid stenosis 10/08/2013   Cervical disc disease    Depression    Diverticulitis    Diverticulosis    Frequent headaches    Hypertension    does not now per pt.   Hypothyroidism 10/26/2015   Impaired glucose tolerance 10/08/2013   normal HgA1C   Neck pain    Psoriasis    Past Surgical History:  Procedure Laterality Date   BREAST BIOPSY Left 2011   benign    CERVICAL FUSION     approx 1980   CHOLECYSTECTOMY     COLONOSCOPY     I & D EXTREMITY Right 03/07/2022   Procedure: IRRIGATION AND DEBRIDEMENT OF FOOT WITH BONE BIOPSY;  Surgeon: Trula Slade, DPM;  Location: [facility];  Service: Podiatry;  Laterality: Right;   POSTERIOR CERVICAL FUSION/FORAMINOTOMY N/A 11/05/2014   Procedure: Cervical four to Cervical seven  Posterior Cervical Fusion with lateral mass fixation;  Surgeon: Eustace Moore, MD;  Location: [facility];  Service: Neurosurgery;  Laterality: N/A;   C4 - C7 Posterior Cervical Fusion with lateral mass fixation   TOTAL ABDOMINAL HYSTERECTOMY  1998   Fibroid   UPPER GASTROINTESTINAL ENDOSCOPY       Social History   Tobacco Use   Smoking status: Never   Smokeless tobacco: Never  Vaping Use   Vaping Use: Never used  Substance Use Topics   Alcohol use: Yes    Alcohol/week: 0.0 standard drinks of alcohol    Comment: 1 a month   Drug use: No    Family History  Problem Relation Age of Onset   Heart disease Mother    Diabetes Mother    Cervical cancer Mother    Heart attack Mother    Lung cancer Father    Alcoholism Father    Colon cancer Father    Diabetes Brother    Throat cancer Brother    Cancer  Other        lung cancer   Heart disease Other    Hypertension Other    Arthritis Other    Alcohol abuse Other    Rectal cancer Neg Hx    Stomach cancer Neg Hx     Allergies  Allergen Reactions   Celebrex [Celecoxib] Itching and Rash    Health Maintenance  Topic Date Due   COVID-19 Vaccine (6 - [place] series) 05/12/2021   INFLUENZA VACCINE  12/20/2021   Medicare Annual Wellness (AWV)  05/25/2022   MAMMOGRAM  07/13/2022   TETANUS/TDAP  05/22/2026   Pneumonia Vaccine 35+ Years old  Completed   DEXA SCAN  Completed   Hepatitis C Screening  Completed   Zoster Vaccines- Shingrix  Completed   HPV VACCINES  Aged Out   COLONOSCOPY (Pts 45-12yr Insurance coverage will need to be confirmed)  Discontinued    Objective: BP 125/85   Pulse 77   Resp 16   Ht '5\' 4"'$  (1.626 m)   Wt 137 lb (62.1 kg)   LMP 05/22/1996   SpO2 98%   BMI 23.52 kg/m    Physical Exam Constitutional:      Appearance: E HENT:     Head: Normocephalic and atraumatic.      Mouth: Mucous membranes are moist.  Eyes:    Conjunctiva/sclera: Conjunctivae normal.     Pupils:   Cardiovascular:     Rate and Rhythm: Normal rate and regular rhythm.     Heart sounds: N  Pulmonary:     Effort: Pulmonary effort is normal.     Breath sounds: Normal breath sounds.   Abdominal:     General: Non distended     Palpations: soft.   Musculoskeletal:        General: Normal range of motion.   RT foot lateral surgical site seems to be healing well with no active drainage, minimal tenderness. No fluctuance. DP palpable. DP and TP palpable   Skin:    General: Skin is warm and dry.     Comments:  Neurological:     General: grossly non focal     Mental Status: awake, alert and oriented to person, place, and time.   Psychiatric:        Mood and Affect: Mood normal.   Lab Results Lab Results  Component Value Date   WBC 7.7 03/21/2022   HGB 12.4 03/21/2022   HCT 37.2 03/21/2022   MCV 95.8 03/21/2022   PLT  255.0 03/21/2022    Lab Results  Component Value Date   CREATININE 0.66 03/21/2022   BUN 15 03/21/2022   NA 140 03/21/2022   K 3.5 03/21/2022   CL 102 03/21/2022   CO2 29 03/21/2022    Lab Results  Component Value Date   ALT 13 03/21/2022   AST 18 03/21/2022   ALKPHOS 73 03/21/2022   BILITOT 0.5 03/21/2022    Lab Results  Component Value Date   CHOL 149 03/21/2022   HDL 39.10 03/21/2022   LDLCALC 71 03/21/2022   LDLDIRECT 75.0 04/26/2021   TRIG 196.0 (H) 03/21/2022   CHOLHDL 4 03/21/2022   No results found for: "LABRPR", "RPRTITER" No results found for: "HIV1RNAQUANT", "HIV1RNAVL", "CD4TABS"   Microbiology Results for orders placed or performed during the hospital encounter of 03/06/22  Blood culture (routine x 2)     Status: None   Collection Time: 03/07/22 11:37 AM   Specimen: BLOOD  Result Value Ref Range Status   Specimen Description BLOOD RIGHT ANTECUBITAL  Final   Special Requests   Final    BOTTLES DRAWN AEROBIC AND ANAEROBIC Blood Culture adequate volume   Culture   Final    NO GROWTH 5 DAYS Performed at [facility] [id number] [place] [id number]   Report Status 03/12/2022 FINAL  Final  Blood culture (routine x 2)     Status: None   Collection Time: 03/07/22 11:42 AM   Specimen: BLOOD  Result Value Ref Range Status   Specimen Description BLOOD LEFT ANTECUBITAL  Final   Special Requests   Final    BOTTLES DRAWN AEROBIC AND ANAEROBIC Blood Culture results may not be optimal due to an excessive volume of blood received in culture bottles   Culture   Final    NO GROWTH 5 DAYS Performed at [facility], [place] [address] [place], [place] [address]    Report Status 03/12/2022 FINAL  Final  Aerobic/Anaerobic Culture w Gram Stain (surgical/deep wound)     Status: None   Collection Time: 03/07/22  4:46 PM   Specimen: Wound  Result Value Ref Range Status   Specimen Description WOUND RIGHT FOOT  Final   Special Requests SAMPLE A   Final   Gram Stain NO WBC SEEN NO ORGANISMS SEEN   Final   Culture   Final    No growth aerobically or anaerobically. Performed at [facility], [place] [address] [place], [place] [address]    Report Status 03/12/2022 FINAL  Final  Aerobic/Anaerobic Culture w Gram Stain (surgical/deep wound)     Status: None   Collection Time: 03/07/22  4:48 PM   Specimen: Bone; Tissue  Result Value Ref Range Status   Specimen Description BONE  Final   Special Requests FROM 5TH METATARSAL SAMPLE B  Final   Gram Stain NO WBC SEEN NO ORGANISMS SEEN   Final   Culture   Final    No growth aerobically or anaerobically. Performed at [facility], [place] [address] [place], [place] [address]    Report Status 03/12/2022 FINAL  Final  Aerobic/Anaerobic Culture w Gram Stain (surgical/deep wound)     Status: None   Collection Time: 03/07/22  4:53 PM   Specimen: Bone; Tissue  Result Value Ref Range Status   Specimen Description BONE  Final   Special Requests RT 5TH TOE SAMPLE C  Final   Gram Stain NO WBC SEEN NO ORGANISMS SEEN   Final   Culture   Final    No growth aerobically or anaerobically. Performed at [facility], [place] [address], [place], [place] [address]    Report Status 03/12/2022 FINAL  Final  Aerobic/Anaerobic Culture w Gram Stain (surgical/deep wound)     Status: None   Collection Time: 03/07/22  4:54 PM   Specimen: Bone; Tissue  Result Value Ref Range Status   Specimen Description BONE  Final   Special Requests RT 5TH TOE SAMPLE D  Final   Gram Stain NO WBC SEEN NO ORGANISMS SEEN   Final   Culture   Final    No growth aerobically or anaerobically. Performed at [facility], [place] [address] [place], [place] [address]    Report Status 03/12/2022 FINAL  Final   Pathology 03/07/22 FINAL MICROSCOPIC DIAGNOSIS:   A. BONE, RIGHT FOOT, 5TH METATARSAL, BIOPSY:  - Fragments of bone, cartilage and fibroconnective tissue with  degenerative changes   Imaging DG  Foot 2 Views Right  Result Date: 03/20/2022 Please see detailed radiograph report in office note.  DG Foot 2 Views Right  Result Date: 03/20/2022 Please see detailed radiograph report in office note.  DG Foot Complete Right  Result Date: 03/07/2022 CLINICAL DATA:  Postoperative check EXAM: RIGHT FOOT COMPLETE - 3+ VIEW COMPARISON:  Right foot radiographs 03/06/2022, MRI right forefoot 03/07/2022 FINDINGS: New further resection of a portion of the distal shaft of the 5th metatarsal. No definite resection of a portion of the proximal phalanx of the fifth toe where there was mild  lateral proximal marrow edema on recent MRI earlier today. There is moderate soft tissue swelling within the distal lateral forefoot. Mild postoperative subcutaneous air. IMPRESSION: New further resection of a portion of the distal shaft of the 5th metatarsal, the region concerning for osteomyelitis on recent MRI. Electronically Signed   By: Yvonne Kendall M.D.   On: 03/07/2022 17:58   MR FOOT RIGHT WO CONTRAST  Result Date: 03/07/2022 CLINICAL DATA:  Pain, swelling and redness involving the fifth metatarsal. Recent amputation of the fifth metatarsal head. EXAM: MRI OF THE RIGHT FOREFOOT WITHOUT CONTRAST TECHNIQUE: Multiplanar, multisequence MR imaging of the right foot was performed. No intravenous contrast was administered. COMPARISON:  Radiographs 03/06/2022 FINDINGS: Abnormal T1 and T2 signal intensity involving the amputation site. There is surrounding soft tissue swelling/edema/fluid. Suspect small abscess along the plantar aspect. Findings are highly suspicious for osteomyelitis. There is also a small focus of signal abnormality/marrow edema involving the proximal phalanx of the fifth toe which could be an early site of osteomyelitis. The other bony structures are intact. There is diffuse cellulitis and myofasciitis without definite findings for pyomyositis. IMPRESSION: 1. MR findings consistent with osteomyelitis  involving the fifth metatarsal resection site. Surrounding fluid collections worrisome for small abscesses. 2. Suspect small focus of early osteomyelitis involving the proximal phalanx of the fifth toe also. 3. Diffuse cellulitis and myofasciitis. These results will be called to the ordering clinician or representative by the Radiologist Assistant, and communication documented in the PACS or [employer]. Electronically Signed   By: Marijo Sanes M.D.   On: 03/07/2022 08:13   DG Foot Complete Right  Result Date: 03/06/2022 CLINICAL DATA:  Postoperative pain and redness of fifth metatarsal. EXAM: RIGHT FOOT COMPLETE - 3+ VIEW COMPARISON:  Right foot radiographs 03/02/2022 FINDINGS: Redemonstration of amputation of the distal shaft and head of fifth metatarsal. There is moderate regional soft tissue swelling, similar to 03/02/2022. The amputation site appears sharp. Slightly decreased bone mineralization compared to 03/02/2022 areas secondary to postoperative edema. Mild second through fifth interphalangeal joint space narrowing. No acute fracture dislocation. IMPRESSION: Status post amputation of the distal shaft and head of the fifth metatarsal with moderate regional soft tissue swelling, similar to 03/02/2022. Slightly decreased bone mineralization at the distal amputation site, however no definitive cortical erosion is seen at this time. Please note that recent postoperative state and radiograph modality limit evaluation for acute osteomyelitis. If there is persistent clinical concern for acute osteomyelitis, note is made that MRI would be more sensitive/specific. Electronically Signed   By: Yvonne Kendall M.D.   On: 03/06/2022 21:00   DG Foot 2 Views Right  Result Date: 03/06/2022 Please see detailed radiograph report in office note.   Assessment/Plan  # Osteomyelitis involving the fifth metatarsal resection site with surrounding small abscesses. # Early osteomyelitis involving the proximal  phalanx of the fifth toe  02/10/22 5th metatarsal head resection on   10/17 s/o I and D and bone biopsy. Or notes with no concerns for remaining Osteomyelitis. Multiple cultures taken from rt 5th toe metatarsal no growth. Pathology of rt 5th metatarsal negative for osteomyelitis. Of note she was taking doxycycline prior to her OR on 10/11.  Clinically surgical wound seems to be healing with recent oozing since last Thursday   Plan  Continue doxycycline and augmentin for 4 more weeks to complete 6 weeks for presumptive remaining  osteomyelitis even though cultures are negative and negative bone biopsy. Patient is very frustrated and tearful that she has already  undergone 2 surgeries and is dreaded even with thought if she would need another surgery.  Fu in 4 weeks  Fu with Podiatry as instructed   # Medication Monitoring - 10/31 CBC and BMP unremarkable  - labs today   I have personally spent more than 70 minutes involved in face-to-face and non-face-to-face activities for this patient on the day of the visit. Professional time spent includes the following activities: Preparing to see the patient (review of tests), Obtaining and/or reviewing separately obtained history (admission/discharge record), Performing a medically appropriate examination and/or evaluation , Ordering medications/tests/procedures, referring and communicating with other health care professionals, Documenting clinical information in the EMR, Independently interpreting results (not separately reported), Communicating results to the patient/family/caregiver, Counseling and educating the patient/family/caregiver and Care coordination (not separately reported).   Wilber Oliphant, [facility] 03/23/2022, 10:24 AM

## 2022-03-24 LAB — SEDIMENTATION RATE: Sed Rate: 25 mm/h (ref 0–30)

## 2022-03-24 LAB — C-REACTIVE PROTEIN: CRP: 2.3 mg/L (ref <8.0)

## 2022-03-25 ENCOUNTER — Ambulatory Visit (INDEPENDENT_AMBULATORY_CARE_PROVIDER_SITE_OTHER): Payer: Medicare Other | Admitting: Podiatry

## 2022-03-25 DIAGNOSIS — M21621 Bunionette of right foot: Secondary | ICD-10-CM

## 2022-03-25 DIAGNOSIS — Z9889 Other specified postprocedural states: Secondary | ICD-10-CM

## 2022-03-27 ENCOUNTER — Encounter: Payer: Self-pay | Admitting: Podiatry

## 2022-03-29 ENCOUNTER — Telehealth: Payer: Self-pay | Admitting: Podiatry

## 2022-03-29 NOTE — Telephone Encounter (Signed)
Pt upset stating that her foot is in pain and her skin is peeling again and swelling hasn't gone down. Pt stated if she needs to leave the practice to get help she will. Pt wants to know why is this happening again.   Please advise.

## 2022-03-29 NOTE — Progress Notes (Signed)
Subjective:  Chief Complaint  Patient presents with   Routine Post Op    DOS: 03/07/22 POV#2    Lauren [name] is a 76 y.o. is seen today in office s/p right fifth metatarsal head resection with Dr. Paulla Dolly.  This was complicated and she was admitted postoperatively for concern for osteomyelitis, infection.  Today is my first time seeing her post hospital discharge.  She underwent bone biopsy, debridement with myself.  She presents today for follow-up evaluation.  She states that she still having swelling and discomfort pointing to the fifth MPJ.  She has followed up with infectious disease.  Denies any fevers or chills.  Bone culture fifth toe- No growth Bone culture 5th metatarsal- No growth  Wound right foot- No growth Blood cultures- No growth Pathology- Fragments of bone, cartilage and fibroconnective tissue with  degenerative changes    Objective: General: No acute distress, AAOx3 -pleasant today DP/PT pulses palpable 2/4, CRT < 3 sec to all digits.  Protective sensation intact. Motor function intact.  Right foot: Incision is well coapted without any evidence of dehiscence and some scabbing is present along the incision but there is no signs of dehiscence and there is no drainage or pus.  There is still localized edema along the fifth MPJ mostly plantarly.  She is concerned that the area that she had pain prior to her initial surgery is still present.  There is no fluctuation or crepitation.  There is no malodor. No pain with calf compression, swelling, warmth, erythema.    Assessment and Plan:  Status post right foot fifth metatarsal head resection with Dr. Paulla Dolly and then I&D bone biopsy with myself during admission.   -Treatment options discussed including all alternatives, risks, and complications -Still concerned about the swelling on the fifth MPJ back in a similar inflammatory.  I added a metatarsal pad to help offload this area as a surgical shoe.  Compression, elevation.   Recommend ice daily as well.  Discussed this can take some time for the inflammation to resolve given her surgery as well as the second surgery.  If she continues to have symptoms consider repeat advanced imaging.  She initially had blood work on November 2 and her sed rate was 25 and CRP was 2.3.  Her white blood cell count was also normal at 7.7.  I do expect the swelling to improve as it is localized along the area of the surgery. -If she still has swelling discomfort may apply unna boot next appointment.  -Monitor for any clinical signs or symptoms of infection and directed to call the office immediately should any occur or go to the ER.  Return in about 2 weeks (around 04/08/2022).  Trula Slade DPM

## 2022-03-29 NOTE — Telephone Encounter (Signed)
Patient called again and said that the top of her foot is starting to peel again w/ dry skin and has not made any improvements since visit, please advise.

## 2022-03-29 NOTE — Telephone Encounter (Signed)
Called the patient giving physician's recommendations , verbalized understanding and has scheduled an appointment next week.

## 2022-03-30 ENCOUNTER — Other Ambulatory Visit: Payer: Self-pay | Admitting: Podiatry

## 2022-03-30 MED ORDER — OXYCODONE-ACETAMINOPHEN 5-325 MG PO TABS: 1.0000 | ORAL_TABLET | Freq: Four times a day (QID) | ORAL | 0 refills | Status: DC | PRN

## 2022-04-05 ENCOUNTER — Other Ambulatory Visit: Payer: Self-pay | Admitting: Internal Medicine

## 2022-04-07 ENCOUNTER — Ambulatory Visit (INDEPENDENT_AMBULATORY_CARE_PROVIDER_SITE_OTHER): Payer: Medicare Other | Admitting: Podiatry

## 2022-04-07 DIAGNOSIS — M21621 Bunionette of right foot: Secondary | ICD-10-CM

## 2022-04-07 DIAGNOSIS — Z9889 Other specified postprocedural states: Secondary | ICD-10-CM

## 2022-04-09 NOTE — Progress Notes (Signed)
Subjective: Chief Complaint  Patient presents with   Routine Post Op    DOS: 03/07/22 POV#3     Lauren [name] is a 76 y.o. is seen today in office s/p right fifth metatarsal head resection with Dr. Paulla Dolly.  This was complicated and she was admitted postoperatively for concern for osteomyelitis, infection.  She states that she is doing better and she is able to wear shoe for about 30 minutes and she has been wearing a slipper.  Swelling is also improved.  She does state that her pain is improving.  Denies any fever or chills.  She is on antibiotics per infectious disease.  Bone culture fifth toe- No growth Bone culture 5th metatarsal- No growth  Wound right foot- No growth Blood cultures- No growth Pathology- Fragments of bone, cartilage and fibroconnective tissue with  degenerative changes    Objective: General: No acute distress, AAOx3 -pleasant today DP/PT pulses palpable 2/4, CRT < 3 sec to all digits.  Protective sensation intact. Motor function intact.  Right foot: Incision is well coapted without any evidence of dehiscence and some scabbing is present along the incision.  There is no erythema or warmth.  Edema appears to have improved compared to last appointment.  No significant pain on exam today.  There is no fluctuation or crepitation.  No malodor. No pain with calf compression, swelling, warmth, erythema.    Assessment and Plan:  Status post right foot fifth metatarsal head resection with Dr. Paulla Dolly and then I&D bone biopsy with myself during admission.   -Treatment options discussed including all alternatives, risks, and complications -X-rays obtained reviewed.  There is some mild callus formation which is likely from the surgeries.  Discussed possibly of infection however clinically does not appear to have infection. -Continue transition to regular shoe as tolerated.  Continue ice, elevation.  Discussed compression. -Also discussed that she needs second metatarsal pad  inside of her shoe to help offload. -Monitor for any clinical signs or symptoms of infection and directed to call the office immediately should any occur or go to the ER.  Return in about 4 weeks (around 05/05/2022).  Trula Slade DPM

## 2022-04-11 DIAGNOSIS — K130 Diseases of lips: Secondary | ICD-10-CM | POA: Diagnosis not present

## 2022-04-15 ENCOUNTER — Other Ambulatory Visit: Payer: Self-pay | Admitting: Internal Medicine

## 2022-04-16 NOTE — Telephone Encounter (Signed)
Please refill as per office routine med refill policy (all routine meds to be refilled for 3 mo or monthly (per pt preference) up to one year from last visit, then month to month grace period for 3 mo, then further med refills will have to be denied) ? ?

## 2022-04-24 ENCOUNTER — Ambulatory Visit: Payer: Medicare Other | Admitting: Infectious Diseases

## 2022-04-24 ENCOUNTER — Other Ambulatory Visit: Payer: Self-pay | Admitting: Podiatry

## 2022-04-24 ENCOUNTER — Telehealth: Payer: Self-pay | Admitting: Podiatry

## 2022-04-24 MED ORDER — OXYCODONE-ACETAMINOPHEN 5-325 MG PO TABS: 1.0000 | ORAL_TABLET | Freq: Four times a day (QID) | ORAL | 0 refills | Status: DC | PRN

## 2022-04-24 NOTE — Telephone Encounter (Signed)
Pt calling for pain medication  [place], [place] [address] [place]   Please advise

## 2022-04-25 ENCOUNTER — Other Ambulatory Visit: Payer: Self-pay | Admitting: Internal Medicine

## 2022-04-26 ENCOUNTER — Ambulatory Visit (INDEPENDENT_AMBULATORY_CARE_PROVIDER_SITE_OTHER): Payer: Medicare Other | Admitting: Internal Medicine

## 2022-04-26 ENCOUNTER — Other Ambulatory Visit: Payer: Self-pay

## 2022-04-26 ENCOUNTER — Ambulatory Visit
Admission: RE | Admit: 2022-04-26 | Discharge: 2022-04-26 | Disposition: A | Discharge status: Home / Self Care | Payer: Medicare Other | Source: Ambulatory Visit | Attending: Internal Medicine | Admitting: Internal Medicine

## 2022-04-26 VITALS — BP 140/81 | HR 69 | Temp 97.5°F | Ht 64.0 in | Wt 134.0 lb

## 2022-04-26 DIAGNOSIS — M869 Osteomyelitis, unspecified: Secondary | ICD-10-CM

## 2022-04-26 DIAGNOSIS — M7989 Other specified soft tissue disorders: Secondary | ICD-10-CM | POA: Diagnosis not present

## 2022-04-26 NOTE — Progress Notes (Signed)
Patient Active Problem List   Diagnosis Date Noted   Medication monitoring encounter 03/23/2022   Acute osteomyelitis of metatarsal bone of right foot ([place]) 03/07/2022   Hypokalemia 03/07/2022   GERD (gastroesophageal reflux disease) 03/07/2022   Anterior neck pain 12/23/2021   Neck mass 12/23/2021   Cough 10/27/2021   Recurrent falls 09/04/2021   Headache 07/19/2021   Unilateral primary osteoarthritis, left knee 06/23/2020   Chondromalacia patellae, left knee 06/01/2020   Synovitis of left knee 01/14/2020   Hyperglycemia 12/23/2018   Hematuria 12/23/2018   Vitamin D deficiency 10/25/2017   Rash 05/24/2017   Obesity 05/24/2017   Hypothyroidism 10/26/2015   Peripheral edema 10/22/2015   Cervical dystonia 07/14/2015   Weakness generalized 11/16/2014   Insomnia 11/16/2014   S/P cervical spinal fusion 11/05/2014   Essential hypertension 10/14/2014   Fracture, cervical vertebra ([place]) 08/30/2014   Carotid stenosis 10/08/2013   depression and anxiety     Diverticulitis    Anxiety    Cervical disc disease    Diverticulitis of colon (without mention of hemorrhage)(562.11) 05/10/2013    Patient's Medications  New Prescriptions   No medications on file  Previous Medications   ACETAMINOPHEN-CODEINE (TYLENOL #3) 300-30 MG TABLET    Take 1-2 tablets by mouth every 6 (six) hours as needed for moderate pain.   ALPRAZOLAM (XANAX) 1 MG TABLET    TAKE 1/2 TO 1 TABLET BY MOUTH TWICE DAILY AS NEEDED   AMLODIPINE (NORVASC) 5 MG TABLET    TAKE 1 TABLET BY MOUTH DAILY   ASPIRIN EC 325 MG TABLET    Take 325 mg by mouth daily.   CHOLECALCIFEROL (D3 ADULT PO)    Take 1 tablet by mouth daily.   CYANOCOBALAMIN 100 MCG TABLET    Take 100 mcg by mouth daily.   FAMOTIDINE (PEPCID) 40 MG TABLET    Take 40 mg by mouth daily.   HYDROCHLOROTHIAZIDE (MICROZIDE) 12.5 MG CAPSULE    TAKE 1 CAPSULE BY MOUTH  DAILY   IBUPROFEN (ADVIL) 800 MG TABLET    Take 1 tablet (800 mg total) by mouth 2  (two) times daily as needed for moderate pain.   LEVOTHYROXINE (SYNTHROID) 50 MCG TABLET    TAKE 1 TABLET BY MOUTH DAILY   OXYCODONE-ACETAMINOPHEN (PERCOCET/ROXICET) 5-325 MG TABLET    Take 1 tablet by mouth every 6 (six) hours as needed for severe pain.   RESTASIS 0.05 % OPHTHALMIC EMULSION    Place 1 drop into both eyes 2 (two) times daily.   ZOLPIDEM (AMBIEN) 5 MG TABLET    Take 1 tablet (5 mg total) by mouth at bedtime as needed. for sleep  Modified Medications   No medications on file  Discontinued Medications   No medications on file    Subjective: 76 year old female with past medical history as below including psoriasis, fifth metatarsal head resection on 02/10/2022 with Dr. Paulla Dolly podiatry on 1012 postop visit there is significant pain at surgical site.  She was started on doxycycline.  Pain worsened and she was admitted to the hospital 10/17-19.  MRI on 10/17 showed osteomyelitis of the fifth metatarsal resection site, surrounding fluid collection worrisome for small abscess.  Small focus of early osteomyelitis involving the proximal phalanx of the fifth toe.  Diffuse cellulitis and mild fasciitis.  On 1017 underwent I&D and bone biopsy.  Or cultures with no growth, path fifth metatarsal negative for osteomyelitis.  She was discharged Doxy with Cipro and Augmentin  x 2 weeks.  Seen by podiatry on 11/2 Dr. Samara Snide and recommended treatment for another 4 weeks to complete 6 weeks of antibiotics for osteomyelitis.   Review of Systems: Review of Systems  All other systems reviewed and are negative.   Past Medical History:  Diagnosis Date   Anxiety    Arthritis    Carotid stenosis 10/08/2013   Cervical disc disease    Depression    Diverticulitis    Diverticulosis    Frequent headaches    Hypertension    does not now per pt.   Hypothyroidism 10/26/2015   Impaired glucose tolerance 10/08/2013   normal HgA1C   Neck pain    Psoriasis     Social History   Tobacco Use   Smoking  status: Never   Smokeless tobacco: Never  Vaping Use   Vaping Use: Never used  Substance Use Topics   Alcohol use: Yes    Alcohol/week: 0.0 standard drinks of alcohol    Comment: 1 a month   Drug use: No    Family History  Problem Relation Age of Onset   Heart disease Mother    Diabetes Mother    Cervical cancer Mother    Heart attack Mother    Lung cancer Father    Alcoholism Father    Colon cancer Father    Diabetes Brother    Throat cancer Brother    Cancer Other        lung cancer   Heart disease Other    Hypertension Other    Arthritis Other    Alcohol abuse Other    Rectal cancer Neg Hx    Stomach cancer Neg Hx     Allergies  Allergen Reactions   Celebrex [Celecoxib] Itching and Rash    Health Maintenance  Topic Date Due   INFLUENZA VACCINE  12/20/2021   COVID-19 Vaccine (7 - 2023-24 season) 01/20/2022   Medicare Annual Wellness (AWV)  05/25/2022   MAMMOGRAM  07/13/2022   DTaP/Tdap/Td (2 - Td or Tdap) 05/22/2026   Pneumonia Vaccine 13+ Years old  Completed   DEXA SCAN  Completed   Hepatitis C Screening  Completed   Zoster Vaccines- Shingrix  Completed   HPV VACCINES  Aged Out   COLONOSCOPY (Pts 45-39yr Insurance coverage will need to be confirmed)  Discontinued    Objective:  There were no vitals filed for this visit. There is no height or weight on file to calculate BMI.  Physical Exam Constitutional:      Appearance: Normal appearance.  HENT:     Head: Normocephalic and atraumatic.     Right Ear: Tympanic membrane normal.     Left Ear: Tympanic membrane normal.     Nose: Nose normal.     Mouth/Throat:     Mouth: Mucous membranes are moist.  Eyes:     Extraocular Movements: Extraocular movements intact.     Conjunctiva/sclera: Conjunctivae normal.     Pupils: Pupils are equal, round, and reactive to light.  Cardiovascular:     Rate and Rhythm: Normal rate and regular rhythm.     Heart sounds: No murmur heard.    No friction rub. No  gallop.  Pulmonary:     Effort: Pulmonary effort is normal.     Breath sounds: Normal breath sounds.  Abdominal:     General: Abdomen is flat.     Palpations: Abdomen is soft.  Musculoskeletal:        General: Normal range of  motion.  Skin:    General: Skin is warm and dry.  Neurological:     General: No focal deficit present.     Mental Status: She is alert and oriented to person, place, and time.  Psychiatric:        Mood and Affect: Mood normal.     Lab Results Lab Results  Component Value Date   WBC 7.7 03/21/2022   HGB 12.4 03/21/2022   HCT 37.2 03/21/2022   MCV 95.8 03/21/2022   PLT 255.0 03/21/2022    Lab Results  Component Value Date   CREATININE 0.66 03/21/2022   BUN 15 03/21/2022   NA 140 03/21/2022   K 3.5 03/21/2022   CL 102 03/21/2022   CO2 29 03/21/2022    Lab Results  Component Value Date   ALT 13 03/21/2022   AST 18 03/21/2022   ALKPHOS 73 03/21/2022   BILITOT 0.5 03/21/2022    Lab Results  Component Value Date   CHOL 149 03/21/2022   HDL 39.10 03/21/2022   LDLCALC 71 03/21/2022   LDLDIRECT 75.0 04/26/2021   TRIG 196.0 (H) 03/21/2022   CHOLHDL 4 03/21/2022   No results found for: "LABRPR", "RPRTITER" No results found for: "HIV1RNAQUANT", "HIV1RNAVL", "CD4TABS"   Assessment/Plan # Osteomyelitis involving the fifth metatarsal resection site with surrounding small abscesses. # Early osteomyelitis involving the proximal phalanx of the fifth toe  -Pt last dose of Augmetin was yeterday, took doxy today. She has completed about 7 weeks of antibiotics for OR on 10/17 as such will stop antibiotics. ESR and CRP are stable.  - Wounds appear to be healing well. She has some lateral foot shooting pain, I suspect it is nerve pain.  Will get Xray to look for any infection Plan: -Labs today as below -Stop doxy and Augmentin -Xray right foot -Continue to [facility] podiatry -Follow-up in one month to do labs off of antibiotics  # Medication Monitoring   11/2: esr  25 crp 2.3, 10/31 wbc 7.7, scr 0.66 Labs today: cbc,cmp. Esr, crp  I have personally spent 45 minutes involved in face-to-face and non-face-to-face activities for this patient on the day of the visit. Professional time spent includes the following activities: Preparing to see the patient (review of tests), Obtaining and/or reviewing separately obtained history (admission/discharge record), Performing a medically appropriate examination and/or evaluation , Ordering medications/tests/procedures, referring and communicating with other health care professionals, Documenting clinical information in the EMR, Independently interpreting results (not separately reported), Communicating results to the patient/family/caregiver, Counseling and educating the patient/family/caregiver and Care coordination (not separately reported).   Laurice Record, MD [place] [facility] 04/26/2022, 8:43 AM

## 2022-04-27 LAB — COMPLETE METABOLIC PANEL WITH GFR
AG Ratio: 1.8 (calc) (ref 1.0–2.5)
ALT: 12 U/L (ref 6–29)
AST: 15 U/L (ref 10–35)
Albumin: 4.2 g/dL (ref 3.6–5.1)
Alkaline phosphatase (APISO): 76 U/L (ref 37–153)
BUN: 18 mg/dL (ref 7–25)
CO2: 30 mmol/L (ref 20–32)
Calcium: 9.4 mg/dL (ref 8.6–10.4)
Chloride: 102 mmol/L (ref 98–110)
Creat: 0.71 mg/dL (ref 0.60–1.00)
Globulin: 2.3 g/dL (calc) (ref 1.9–3.7)
Glucose, Bld: 89 mg/dL (ref 65–99)
Potassium: 3.6 mmol/L (ref 3.5–5.3)
Sodium: 142 mmol/L (ref 135–146)
Total Bilirubin: 0.4 mg/dL (ref 0.2–1.2)
Total Protein: 6.5 g/dL (ref 6.1–8.1)
eGFR: 88 mL/min/{1.73_m2} (ref >=60)

## 2022-04-27 LAB — CBC WITH DIFFERENTIAL/PLATELET
Absolute Monocytes: 422 cells/uL (ref 200–950)
Basophils Absolute: 40 cells/uL (ref 0–200)
Basophils Relative: 0.6 %
Eosinophils Absolute: 80 cells/uL (ref 15–500)
Eosinophils Relative: 1.2 %
HCT: 37.7 % (ref 35.0–45.0)
Hemoglobin: 12.5 g/dL (ref 11.7–15.5)
Lymphs Abs: 1642 cells/uL (ref 850–3900)
MCH: 32.1 pg (ref 27.0–33.0)
MCHC: 33.2 g/dL (ref 32.0–36.0)
MCV: 96.9 fL (ref 80.0–100.0)
MPV: 9.8 fL (ref 7.5–12.5)
Monocytes Relative: 6.3 %
Neutro Abs: 4516 cells/uL (ref 1500–7800)
Neutrophils Relative %: 67.4 %
Platelets: 218 10*3/uL (ref 140–400)
RBC: 3.89 10*6/uL (ref 3.80–5.10)
RDW: 12.5 % (ref 11.0–15.0)
Total Lymphocyte: 24.5 %
WBC: 6.7 10*3/uL (ref 3.8–10.8)

## 2022-04-27 LAB — SEDIMENTATION RATE: Sed Rate: 19 mm/h (ref 0–30)

## 2022-04-27 LAB — C-REACTIVE PROTEIN: CRP: 6.5 mg/L (ref <8.0)

## 2022-05-01 ENCOUNTER — Telehealth: Payer: Self-pay | Admitting: Internal Medicine

## 2022-05-01 NOTE — Telephone Encounter (Signed)
Inbound call from patient requesting rx for diverticulitis says she is having a flare up. Please advise.

## 2022-05-02 ENCOUNTER — Ambulatory Visit (INDEPENDENT_AMBULATORY_CARE_PROVIDER_SITE_OTHER): Payer: Medicare Other | Admitting: Internal Medicine

## 2022-05-02 VITALS — BP 134/80 | HR 79 | Temp 97.8°F | Ht 64.0 in | Wt 136.0 lb

## 2022-05-02 DIAGNOSIS — R21 Rash and other nonspecific skin eruption: Secondary | ICD-10-CM

## 2022-05-02 DIAGNOSIS — E7800 Pure hypercholesterolemia, unspecified: Secondary | ICD-10-CM

## 2022-05-02 DIAGNOSIS — R739 Hyperglycemia, unspecified: Secondary | ICD-10-CM

## 2022-05-02 DIAGNOSIS — F32A Depression, unspecified: Secondary | ICD-10-CM

## 2022-05-02 DIAGNOSIS — R9431 Abnormal electrocardiogram [ECG] [EKG]: Secondary | ICD-10-CM

## 2022-05-02 DIAGNOSIS — M79671 Pain in right foot: Secondary | ICD-10-CM

## 2022-05-02 DIAGNOSIS — E039 Hypothyroidism, unspecified: Secondary | ICD-10-CM

## 2022-05-02 DIAGNOSIS — I10 Essential (primary) hypertension: Secondary | ICD-10-CM | POA: Diagnosis not present

## 2022-05-02 DIAGNOSIS — Z0001 Encounter for general adult medical examination with abnormal findings: Secondary | ICD-10-CM | POA: Diagnosis not present

## 2022-05-02 MED ORDER — CITALOPRAM HYDROBROMIDE 10 MG PO TABS: 10.0000 mg | ORAL_TABLET | Freq: Every day | ORAL | 3 refills | Status: DC

## 2022-05-02 MED ORDER — KETOCONAZOLE 2 % EX CREA: 1.0000 | TOPICAL_CREAM | Freq: Every day | CUTANEOUS | 1 refills | Status: DC

## 2022-05-02 NOTE — Telephone Encounter (Unsigned)
Left message for pt to call back  °

## 2022-05-02 NOTE — Patient Instructions (Signed)
Another option for the right foot would be Dr Doran Durand at [facility] at [place] [place] to stop the neosporin, and try the antifungal cream  Please take all new medication as prescribed - the citalopram 10 mg per day  Please continue all other medications as before, and refills have been done if requested.  Please have the pharmacy call with any other refills you may need.  Please continue your efforts at being more active, low cholesterol diet, and weight control.  You are otherwise up to date with prevention measures today.  Please keep your appointments with your specialists as you may have planned  You will be contacted regarding the referral for: Cardiac CT score  Please make an Appointment to return in 6 months, or sooner if needed

## 2022-05-02 NOTE — Progress Notes (Unsigned)
Patient ID: [name], female   DOB: 03-12-1946, 76 y.o.   MRN: [id number]         Chief Complaint:: wellness exam and Physical (Rash in creases of legs that is not going away, would like to be seen by a female provider)  , right foot pain persistent, htn, hyperglycemia, low thyroid,hld        HPI:  Lauren [name] is a 76 y.o. female here for wellness exam; declines covid booster, flu shot                        Also right foot pain persists, has appt for second opinion with Dr Lauren [name] soon.  Also with 1 mo bilateral inguinal area rash after prolonged antibx for right foot.  Itchy, mild discomfort.  Also with mild worsening depressive symptoms, but no suicidal ideation, or panic.  Willing for card CT score  Lost wt from 157 last yr. Pt denies chest pain, increased sob or doe, wheezing, orthopnea, PND, increased LE swelling, palpitations, dizziness or syncope.   Pt denies polydipsia, polyuria, or new focal neuro s/s.    Pt denies night sweats, loss of appetite, or other constitutional symptoms.  Denies hyper or hypo thyroid symptoms such as voice, skin or hair change.   Wt Readings from Last 3 Encounters:  05/02/22 136 lb (61.7 kg)  04/26/22 134 lb (60.8 kg)  03/23/22 137 lb (62.1 kg)   BP Readings from Last 3 Encounters:  05/02/22 134/80  04/26/22 (!) 140/81  03/23/22 125/85   Immunization History  Administered Date(s) Administered   Fluad Quad(high Dose 65+) 02/07/2019   Influenza, High Dose Seasonal PF 03/27/2018, 02/26/2020   Influenza-Unspecified 02/20/2015, 02/26/2016, 03/22/2018, 03/14/2021   PFIZER Comirnaty(Gray Top)Covid-19 Tri-Sucrose Vaccine 08/23/2020   PFIZER(Purple Top)SARS-COV-2 Vaccination 06/28/2019, 07/22/2019, 02/16/2020, 02/28/2021   Pfizer Covid-19 Vaccine Bivalent Booster 83yr & up 03/17/2021   Pneumococcal Conjugate-13 10/08/2013   Pneumococcal Polysaccharide-23 10/14/2014   Tdap 05/22/2016   Zoster Recombinat (Shingrix) 07/04/2017, 10/31/2017   Zoster,  Live 10/15/2014   Health Maintenance Due  Topic Date Due   INFLUENZA VACCINE  12/20/2021   COVID-19 Vaccine (7 - 2023-24 season) 01/20/2022   Medicare Annual Wellness (AWV)  05/25/2022      Past Medical History:  Diagnosis Date   Anxiety    Arthritis    Carotid stenosis 10/08/2013   Cervical disc disease    Depression    Diverticulitis    Diverticulosis    Frequent headaches    Hypertension    does not now per pt.   Hypothyroidism 10/26/2015   Impaired glucose tolerance 10/08/2013   normal HgA1C   Neck pain    Psoriasis    Past Surgical History:  Procedure Laterality Date   BREAST BIOPSY Left 2011   benign    CERVICAL FUSION     approx 1980   CHOLECYSTECTOMY     COLONOSCOPY     I & D EXTREMITY Right 03/07/2022   Procedure: IRRIGATION AND DEBRIDEMENT OF FOOT WITH BONE BIOPSY;  Surgeon: Lauren [name] DPM;  Location: [facility]  Service: Podiatry;  Laterality: Right;   POSTERIOR CERVICAL FUSION/FORAMINOTOMY N/A 11/05/2014   Procedure: Cervical four to Cervical seven  Posterior Cervical Fusion with lateral mass fixation;  Surgeon: DEustace Moore MD;  [facility];  Service: Neurosurgery;  Laterality: N/A;   C4 - C7 Posterior Cervical Fusion with lateral mass fixation   TOTAL ABDOMINAL HYSTERECTOMY  1998  Fibroid   UPPER GASTROINTESTINAL ENDOSCOPY      reports that she has never smoked. She has never used smokeless tobacco. She reports current alcohol use. She reports that she does not use drugs. family history includes Alcohol abuse in an other family member; Alcoholism in her father; Arthritis in an other family member; Cancer in an other family member; Cervical cancer in her mother; Colon cancer in her father; Diabetes in her brother and mother; Heart attack in her mother; Heart disease in her mother and another family member; Hypertension in an other family member; Lung cancer in her father; Throat cancer in her brother. Allergies  Allergen Reactions   Celebrex  [Celecoxib] Itching and Rash   Current Outpatient Medications on File Prior to Visit  Medication Sig Dispense Refill   acetaminophen-codeine (TYLENOL #3) 300-30 MG tablet Take 1-2 tablets by mouth every 6 (six) hours as needed for moderate pain. 15 tablet 0   ALPRAZolam (XANAX) 1 MG tablet TAKE 1/2 TO 1 TABLET BY MOUTH TWICE DAILY AS NEEDED 60 tablet 0   amLODipine (NORVASC) 5 MG tablet TAKE 1 TABLET BY MOUTH DAILY (Patient taking differently: Take 5 mg by mouth daily.) 100 tablet 2   aspirin EC 325 MG tablet Take 325 mg by mouth daily.     Cholecalciferol (D3 ADULT PO) Take 1 tablet by mouth daily.     cyanocobalamin 100 MCG tablet Take 100 mcg by mouth daily.     famotidine (PEPCID) 40 MG tablet Take 40 mg by mouth daily.     hydrochlorothiazide (MICROZIDE) 12.5 MG capsule TAKE 1 CAPSULE BY MOUTH  DAILY (Patient taking differently: Take 12.5 mg by mouth daily.) 90 capsule 2   ibuprofen (ADVIL) 800 MG tablet Take 1 tablet (800 mg total) by mouth 2 (two) times daily as needed for moderate pain. 60 tablet 4   levothyroxine (SYNTHROID) 50 MCG tablet TAKE 1 TABLET BY MOUTH DAILY (Patient taking differently: Take 50 mcg by mouth daily.) 100 tablet 2   mupirocin ointment (BACTROBAN) 2 % SMARTSIG:1 sparingly Topical Daily     nystatin-triamcinolone ointment (MYCOLOG) SMARTSIG:sparingly Topical Twice Daily     oxyCODONE-acetaminophen (PERCOCET/ROXICET) 5-325 MG tablet Take 1 tablet by mouth every 6 (six) hours as needed for severe pain. 10 tablet 0   RESTASIS 0.05 % ophthalmic emulsion Place 1 drop into both eyes 2 (two) times daily.     zolpidem (AMBIEN) 5 MG tablet Take 1 tablet (5 mg total) by mouth at bedtime as needed. for sleep 90 tablet 1   No current facility-administered medications on file prior to visit.        ROS:  All others reviewed and negative.  Objective        PE:  BP 134/80 (BP Location: Left Arm, Patient Position: Sitting, Cuff Size: Large)   Pulse 79   Temp 97.8 F (36.6  C) (Oral)   Ht '5\' 4"'$  (1.626 m)   Wt 136 lb (61.7 kg)   LMP 05/22/1996   SpO2 97%   BMI 23.34 kg/m                 Constitutional: Pt appears in NAD               HENT: Head: NCAT.                Right Ear: External ear normal.                 Left Ear: External ear normal.  Eyes: . Pupils are equal, round, and reactive to light. Conjunctivae and EOM are normal               Nose: without d/c or deformity               Neck: Neck supple. Gross normal ROM               Cardiovascular: Normal rate and regular rhythm.                 Pulmonary/Chest: Effort normal and breath sounds without rales or wheezing.                Abd:  Soft, NT, ND, + BS, no organomegaly               Neurological: Pt is alert. At baseline orientation, motor grossly intact               Skin: Skin is warm, LE edema - none               Psychiatric: Pt behavior is normal without agitation , depressed affect  Micro: none  Cardiac tracings I have personally interpreted today:  none  Pertinent Radiological findings (summarize): none   Lab Results  Component Value Date   WBC 6.7 04/26/2022   HGB 12.5 04/26/2022   HCT 37.7 04/26/2022   PLT 218 04/26/2022   GLUCOSE 89 04/26/2022   CHOL 149 03/21/2022   TRIG 196.0 (H) 03/21/2022   HDL 39.10 03/21/2022   LDLDIRECT 75.0 04/26/2021   LDLCALC 71 03/21/2022   ALT 12 04/26/2022   AST 15 04/26/2022   NA 142 04/26/2022   K 3.6 04/26/2022   CL 102 04/26/2022   CREATININE 0.71 04/26/2022   BUN 18 04/26/2022   CO2 30 04/26/2022   TSH 3.13 03/21/2022   INR 0.9 07/16/2021   HGBA1C 5.8 03/21/2022   Assessment/Plan:  Lauren [name] is a 76 y.o. White or Caucasian [1] female with  has a past medical history of Anxiety, Arthritis, Carotid stenosis (10/08/2013), Cervical disc disease, Depression, Diverticulitis, Diverticulosis, Frequent headaches, Hypertension, Hypothyroidism (10/26/2015), Impaired glucose tolerance (10/08/2013), Neck pain, and  Psoriasis.  Encounter for well adult exam with abnormal findings Age and sex appropriate education and counseling updated with regular exercise and diet Referrals for preventative services - none needed Immunizations addressed - declines covid booster, flu shot Smoking counseling  - none needed Evidence for depression or other mood disorder - mild situational wrosening post right foot pain, for celexa 10 mg qd, declines referral counseling Most recent labs reviewed. I have personally reviewed and have noted: 1) the patient's medical and social history 2) The patient's current medications and supplements 3) The patient's height, weight, and BMI have been recorded in the chart   depression and anxiety  Mild to mod, for celexa 10 mg qd to f/u any worsening symptoms or concerns   Essential hypertension BP Readings from Last 3 Encounters:  05/02/22 134/80  04/26/22 (!) 140/81  03/23/22 125/85   Stable, pt to continue medical treatment norvasc 5 mg qd, hct 12.5 mg qd    Hyperglycemia Lab Results  Component Value Date   HGBA1C 5.8 03/21/2022   Stable, pt to continue current medical treatment  - diet, wt control   Hypothyroidism Lab Results  Component Value Date   TSH 3.13 03/21/2022   Stable, pt to continue levothyroxine 50 mcg qd  Right foot pain D/w pt to consider self referral  Dr Doran Durand ortho if not improving  HLD (hyperlipidemia) Lab Results  Component Value Date   LDLCALC 71 03/21/2022   Uncontrolled, goal ldl < 70,, pt to continue current low chol diet, declines statin   Rash C/w fungal rash bilateral groin - for ketoconozole cr prn  Followup: Return in about 6 months (around 11/01/2022).  Lauren Cower, MD 05/03/2022 1:15 PM [facility]

## 2022-05-03 DIAGNOSIS — M79671 Pain in right foot: Secondary | ICD-10-CM | POA: Insufficient documentation

## 2022-05-03 DIAGNOSIS — E785 Hyperlipidemia, unspecified: Secondary | ICD-10-CM | POA: Insufficient documentation

## 2022-05-03 NOTE — Assessment & Plan Note (Signed)
Mild to mod, for celexa 10 mg qd to f/u any worsening symptoms or concerns

## 2022-05-03 NOTE — Assessment & Plan Note (Signed)
Age and sex appropriate education and counseling updated with regular exercise and diet Referrals for preventative services - none needed Immunizations addressed - declines covid booster, flu shot Smoking counseling  - none needed Evidence for depression or other mood disorder - mild situational wrosening post right foot pain, for celexa 10 mg qd, declines referral counseling Most recent labs reviewed. I have personally reviewed and have noted: 1) the patient's medical and social history 2) The patient's current medications and supplements 3) The patient's height, weight, and BMI have been recorded in the chart

## 2022-05-03 NOTE — Assessment & Plan Note (Signed)
BP Readings from Last 3 Encounters:  05/02/22 134/80  04/26/22 (!) 140/81  03/23/22 125/85   Stable, pt to continue medical treatment norvasc 5 mg qd, hct 12.5 mg qd

## 2022-05-03 NOTE — Assessment & Plan Note (Signed)
C/w fungal rash bilateral groin - for ketoconozole cr prn

## 2022-05-03 NOTE — Telephone Encounter (Unsigned)
Left message for pt to call back  °

## 2022-05-03 NOTE — Assessment & Plan Note (Signed)
D/w pt to consider self referral Dr Doran Durand ortho if not improving

## 2022-05-03 NOTE — Assessment & Plan Note (Signed)
Lab Results  Component Value Date   HGBA1C 5.8 03/21/2022   Stable, pt to continue current medical treatment  - diet, wt control

## 2022-05-03 NOTE — Assessment & Plan Note (Signed)
Lab Results  Component Value Date   LDLCALC 71 03/21/2022   Uncontrolled, goal ldl < 70,, pt to continue current low chol diet, declines statin

## 2022-05-03 NOTE — Assessment & Plan Note (Signed)
Lab Results  Component Value Date   TSH 3.13 03/21/2022   Stable, pt to continue levothyroxine 50 mcg qd

## 2022-05-04 MED ORDER — AMOXICILLIN-POT CLAVULANATE 875-125 MG PO TABS: 1.0000 | ORAL_TABLET | Freq: Two times a day (BID) | ORAL | 0 refills | Status: DC

## 2022-05-04 NOTE — Telephone Encounter (Signed)
Meds ordered this encounter  Medications   amoxicillin-clavulanate (AUGMENTIN) 875-125 MG tablet    Sig: Take 1 tablet by mouth 2 (two) times daily.    Dispense:  20 tablet    Refill:  0   I sent this to [facility]  Tell her to stay on a soft diet and if this does not relieve her problems to let me know.

## 2022-05-04 NOTE — Telephone Encounter (Signed)
Pt stated that she has been having left lower abdominal pain for a week now. Taking ibuprofen but not helping any. No other symptoms: Pt stated that she feels as it has in the past  with her diverticulitis flare up : Pt requesting prescription: Please advise

## 2022-05-05 ENCOUNTER — Ambulatory Visit (INDEPENDENT_AMBULATORY_CARE_PROVIDER_SITE_OTHER): Payer: Medicare Other

## 2022-05-05 ENCOUNTER — Ambulatory Visit: Payer: Medicare Other

## 2022-05-05 VITALS — BP 159/59

## 2022-05-05 DIAGNOSIS — G8918 Other acute postprocedural pain: Secondary | ICD-10-CM | POA: Diagnosis not present

## 2022-05-05 NOTE — Progress Notes (Addendum)
DOS: 03/07/22 POV#4 .  Patient states her right foot pain is still the same since the previous visit. Patient states foot pain starts at the ball of foot and travels to the front of her foot near her pinky toe. Patient states foot pain is constant especially when pressure is applied. Patient express concerns and wants to know her next plan for her foot , states she went to get a second opinion and they stated that her foot was infected. Xray's were taken at this visit.   --  Subjective: 76 year old female presents the office today with the above concerns.  Previous underwent fifth metatarsal head resection with Dr. Paulla Dolly.  Subsequently developed possible infection and treatment of the hospital I took her to surgery at night for I&D, biopsy.  She is in frustrated with her for it.  The point having depressed because of this.  She still having constant pain.  She is try to go back into regular shoe for which she is wearing today.  She describes sharp pain to the area.  She did go for second opinion yesterday and she was told the foot was still infected.  She has seen infectious disease and completed course of antibiotics.  After discussing yesterday it appears that no new imaging or blood work was ordered from what she tells me.   Objective: AAO x3, NAD DP/PT pulses palpable bilaterally, CRT less than 3 seconds Foot.  There is edema still present on the digit itself MPJ area but does appear to be improved compared to what it was last time I saw that there is no erythema or warmth associated this.  See pictures below.  There is a bony prominence noted on the plantar aspect this is where she gets a "callus", not able to identify significant callus tissue. No pain with calf compression, swelling, warmth, erythema         Assessment:   Plan: -All treatment options discussed with the patient including all alternatives, risks, complications.  -X-rays were obtained and reviewed.  3 views of the foot  were obtained.  Mottling noted along the previous surgery which I think is benign remodeling.  I discussed with her osteomyelitis but review of the blood work was normal.  Swelling is actually improved.  I think most of her symptoms are coming more still having a bony prominence/pressure.  I dispensed a gel metatarsal pad today.  She is in the coming next week and will have padding padding to wear shoes.  She is wearing a flat, flexible shoe today discussed better support. -Patient encouraged to call the office with any questions, concerns, change in symptoms.   Celesta Gentile, DPM

## 2022-05-05 NOTE — Telephone Encounter (Unsigned)
Pt was left a detailed message 2 days ago with Dr. Carlean Purl recommendations: Left message for pt to call back

## 2022-05-08 NOTE — Telephone Encounter (Unsigned)
Unable to reach pt by phone: Pharmacy called and questioned if pt has filled the prescription: Pharmacy stated that the pt picked up the medication on 05/06/2022

## 2022-05-10 ENCOUNTER — Telehealth: Payer: Self-pay | Admitting: Internal Medicine

## 2022-05-10 ENCOUNTER — Telehealth: Payer: Self-pay | Admitting: Podiatry

## 2022-05-10 ENCOUNTER — Ambulatory Visit (INDEPENDENT_AMBULATORY_CARE_PROVIDER_SITE_OTHER): Payer: Medicare Other | Admitting: Podiatry

## 2022-05-10 ENCOUNTER — Ambulatory Visit (HOSPITAL_COMMUNITY)
Admission: RE | Admit: 2022-05-10 | Discharge: 2022-05-10 | Disposition: A | Discharge status: Home / Self Care | Payer: Medicare Other | Source: Ambulatory Visit | Attending: Internal Medicine | Admitting: Internal Medicine

## 2022-05-10 ENCOUNTER — Other Ambulatory Visit: Payer: Medicare Other

## 2022-05-10 DIAGNOSIS — I10 Essential (primary) hypertension: Secondary | ICD-10-CM | POA: Insufficient documentation

## 2022-05-10 DIAGNOSIS — R739 Hyperglycemia, unspecified: Secondary | ICD-10-CM | POA: Insufficient documentation

## 2022-05-10 DIAGNOSIS — R9431 Abnormal electrocardiogram [ECG] [EKG]: Secondary | ICD-10-CM | POA: Insufficient documentation

## 2022-05-10 DIAGNOSIS — Z9889 Other specified postprocedural states: Secondary | ICD-10-CM

## 2022-05-10 DIAGNOSIS — E7800 Pure hypercholesterolemia, unspecified: Secondary | ICD-10-CM | POA: Insufficient documentation

## 2022-05-10 MED ORDER — CLOTRIMAZOLE-BETAMETHASONE 1-0.05 % EX CREA: 1.0000 | TOPICAL_CREAM | Freq: Every day | CUTANEOUS | 1 refills | Status: DC

## 2022-05-10 NOTE — Progress Notes (Signed)
Patient presented today for me to add a metatarsal pad to wear shoe.  She brought in a Sperry type shoe to wear as it was wide for her.  I have a metatarsal pad with offloading for the fifth MPJ.  She tried this on state that it took pressure off.  I also dispensed moleskin for her.  Today clinically the toe is less swollen but she still has pain along the area where she had a callus that prior to surgery.  She is very frustrated by this and reports that she is having "pain and suffering" from what "we did to her toe".  She did try get medical records today but apparently would not sign a release.  Discussed policy with her.  I discussed with patient I am trying to help her feel better and she states that she is not upset with me and gave me a hug on the way out. I want to see her back in 4 weeks or sooner if needed. If she is improving and doing well then I can see her back prn. She has followed up with ID. Although she is still having pain, I do not think clinically it looks infected today.

## 2022-05-10 NOTE — Progress Notes (Signed)
Patient was not seen by nurse today.

## 2022-05-10 NOTE — Telephone Encounter (Signed)
Ok for change to lotrisone cream asd - done erx  If not improved, pt should see GYN (we can refer if needed)

## 2022-05-10 NOTE — Telephone Encounter (Signed)
Patient called and said that tthe nizoral cream is not working.  Please send in something else.,  Patients number:  [phone]  Pharmacy:  [facility] on [address]

## 2022-05-10 NOTE — Telephone Encounter (Signed)
Patient prescribed Nizoral and states it is not helping and is requesting another cream, please advise

## 2022-05-10 NOTE — Telephone Encounter (Signed)
I ordered a compound cream from [facility] to help. They called her last week but she never heard back from them. Can you call them to see if they need anything else from me or see why she has not yet received this? Thanks!

## 2022-05-10 NOTE — Telephone Encounter (Signed)
Informed patient of Rx change sent to pharmacy via voicemail

## 2022-05-11 ENCOUNTER — Other Ambulatory Visit: Payer: Self-pay | Admitting: Internal Medicine

## 2022-05-11 ENCOUNTER — Encounter: Payer: Self-pay | Admitting: *Deleted

## 2022-05-11 MED ORDER — PRAVASTATIN SODIUM 20 MG PO TABS: 20.0000 mg | ORAL_TABLET | Freq: Every day | ORAL | 3 refills | Status: DC

## 2022-05-11 NOTE — Progress Notes (Signed)
Patient was enrolled 08/30/21 for [employer] to ship a 30 day cardiac event monitor to the patient. Patient never applied monitor.  Order will be cancelled.

## 2022-05-11 NOTE — Telephone Encounter (Signed)
Spoke with [employer] and they stated that they have been trying to call the patient since 05/05/22 but the patient is not answering their calls. I called the patient this morning and she said that she has been trying to call the pharmacy and she can't get anyone to answer the phone. I gave the patient the correct number to call them back and the pharmacy is going to try and contact the patient this morning as well.

## 2022-05-16 ENCOUNTER — Telehealth: Payer: Self-pay | Admitting: Internal Medicine

## 2022-05-16 DIAGNOSIS — N760 Acute vaginitis: Secondary | ICD-10-CM

## 2022-05-16 NOTE — Telephone Encounter (Signed)
Patient would like a referral to a ob/gyn for her vaginal problem that Dr. Jenny Reichmann prescribed cream - cream does not work - she would like a female doctor - no female.

## 2022-05-17 NOTE — Telephone Encounter (Signed)
Ok referral is done.

## 2022-05-23 NOTE — Telephone Encounter (Signed)
Notified MD place referral should received a call from Sent to [facility]. Pt states facility already called her.Marland KitchenJohny Chess

## 2022-05-24 ENCOUNTER — Telehealth: Payer: Self-pay | Admitting: Internal Medicine

## 2022-05-24 NOTE — Telephone Encounter (Signed)
Left message for patient to call back to schedule Medicare Annual Wellness Visit   Last AWV  05/25/21  Please schedule at anytime with [facility] Health Advisor if patient calls the office back.     Any questions, please call me at [phone] 

## 2022-05-29 ENCOUNTER — Ambulatory Visit (INDEPENDENT_AMBULATORY_CARE_PROVIDER_SITE_OTHER): Payer: Medicare Other | Admitting: Internal Medicine

## 2022-05-29 ENCOUNTER — Other Ambulatory Visit: Payer: Self-pay

## 2022-05-29 ENCOUNTER — Encounter: Payer: Self-pay | Admitting: Internal Medicine

## 2022-05-29 VITALS — BP 120/72 | HR 60 | Wt 134.6 lb

## 2022-05-29 DIAGNOSIS — M869 Osteomyelitis, unspecified: Secondary | ICD-10-CM | POA: Diagnosis not present

## 2022-05-29 NOTE — Progress Notes (Signed)
Patient: Lauren [name]  DOB: 02-23-1946 MRN: [id number] PCP: Biagio Borg, MD  ]    Patient Active Problem List   Diagnosis Date Noted   Right foot pain 05/03/2022   HLD (hyperlipidemia) 05/03/2022   Medication monitoring encounter 03/23/2022   Acute osteomyelitis of metatarsal bone of right foot ([place]) 03/07/2022   Hypokalemia 03/07/2022   GERD (gastroesophageal reflux disease) 03/07/2022   Anterior neck pain 12/23/2021   Neck mass 12/23/2021   Cough 10/27/2021   Recurrent falls 09/04/2021   Headache 07/19/2021   Unilateral primary osteoarthritis, left knee 06/23/2020   Chondromalacia patellae, left knee 06/01/2020   Synovitis of left knee 01/14/2020   Hyperglycemia 12/23/2018   Hematuria 12/23/2018   Vitamin D deficiency 10/25/2017   Rash 05/24/2017   Obesity 05/24/2017   Hypothyroidism 10/26/2015   Peripheral edema 10/22/2015   Cervical dystonia 07/14/2015   Weakness generalized 11/16/2014   Insomnia 11/16/2014   S/P cervical spinal fusion 11/05/2014   Essential hypertension 10/14/2014   Fracture, cervical vertebra ([place]) 08/30/2014   Encounter for well adult exam with abnormal findings 10/08/2013   Carotid stenosis 10/08/2013   depression and anxiety     Diverticulitis    Anxiety    Cervical disc disease    Diverticulitis of colon (without mention of hemorrhage)(562.11) 05/10/2013     Subjective:  77 year old female with past medical history as below including psoriasis, fifth metatarsal head resection on 02/10/2022 with Dr. Paulla Dolly podiatry on 1012 postop visit there is significant pain at surgical site. She was started on doxycycline. Pain worsened and she was admitted to the hospital 10/17-19. MRI on 10/17 showed osteomyelitis of the fifth metatarsal resection site, surrounding fluid collection worrisome for small abscess. Small focus of early osteomyelitis involving the proximal phalanx of the fifth toe. Diffuse cellulitis and mild fasciitis. On 1017  underwent I&D and bone biopsy. Or cultures with no growth, path fifth metatarsal negative for osteomyelitis. She was discharged Doxy with Cipro and Augmentin x 2 weeks. Seen by podiatry on 11/2 Dr. Samara Snide and recommended treatment for another 4 weeks to complete 6 weeks of antibiotics for osteomyelitis.   Today 05/29/22: pt reports foot feels better, pain has resolved. She is wearing Dr. Felicie Morn inserts which have helped. No concerns today.  Review of Systems  All other systems reviewed and are negative.   Past Medical History:  Diagnosis Date   Anxiety    Arthritis    Carotid stenosis 10/08/2013   Cervical disc disease    Depression    Diverticulitis    Diverticulosis    Frequent headaches    Hypertension    does not now per pt.   Hypothyroidism 10/26/2015   Impaired glucose tolerance 10/08/2013   normal HgA1C   Neck pain    Psoriasis     Outpatient Medications Prior to Visit  Medication Sig Dispense Refill   acetaminophen-codeine (TYLENOL #3) 300-30 MG tablet Take 1-2 tablets by mouth every 6 (six) hours as needed for moderate pain. 15 tablet 0   ALPRAZolam (XANAX) 1 MG tablet TAKE 1/2 TO 1 TABLET BY MOUTH TWICE DAILY AS NEEDED 60 tablet 0   amLODipine (NORVASC) 5 MG tablet TAKE 1 TABLET BY MOUTH DAILY (Patient taking differently: Take 5 mg by mouth daily.) 100 tablet 2   amoxicillin-clavulanate (AUGMENTIN) 875-125 MG tablet Take 1 tablet by mouth 2 (two) times daily. 20 tablet 0   aspirin EC 325 MG tablet Take 325 mg by mouth daily.  Cholecalciferol (D3 ADULT PO) Take 1 tablet by mouth daily.     citalopram (CELEXA) 10 MG tablet Take 1 tablet (10 mg total) by mouth daily. 90 tablet 3   clotrimazole-betamethasone (LOTRISONE) cream Apply 1 Application topically daily. 30 g 1   cyanocobalamin 100 MCG tablet Take 100 mcg by mouth daily.     famotidine (PEPCID) 40 MG tablet Take 40 mg by mouth daily.     hydrochlorothiazide (MICROZIDE) 12.5 MG capsule TAKE 1 CAPSULE BY MOUTH   DAILY (Patient taking differently: Take 12.5 mg by mouth daily.) 90 capsule 2   ibuprofen (ADVIL) 800 MG tablet Take 1 tablet (800 mg total) by mouth 2 (two) times daily as needed for moderate pain. 60 tablet 4   ketoconazole (NIZORAL) 2 % cream Apply 1 Application topically daily. 30 g 1   levothyroxine (SYNTHROID) 50 MCG tablet TAKE 1 TABLET BY MOUTH DAILY (Patient taking differently: Take 50 mcg by mouth daily.) 100 tablet 2   mupirocin ointment (BACTROBAN) 2 % SMARTSIG:1 sparingly Topical Daily     nystatin-triamcinolone ointment (MYCOLOG) SMARTSIG:sparingly Topical Twice Daily     oxyCODONE-acetaminophen (PERCOCET/ROXICET) 5-325 MG tablet Take 1 tablet by mouth every 6 (six) hours as needed for severe pain. 10 tablet 0   pravastatin (PRAVACHOL) 20 MG tablet Take 1 tablet (20 mg total) by mouth daily. 90 tablet 3   RESTASIS 0.05 % ophthalmic emulsion Place 1 drop into both eyes 2 (two) times daily.     zolpidem (AMBIEN) 5 MG tablet Take 1 tablet (5 mg total) by mouth at bedtime as needed. for sleep 90 tablet 1   No facility-administered medications prior to visit.     Allergies  Allergen Reactions   Celebrex [Celecoxib] Itching and Rash    Social History   Tobacco Use   Smoking status: Never   Smokeless tobacco: Never  Vaping Use   Vaping Use: Never used  Substance Use Topics   Alcohol use: Yes    Alcohol/week: 0.0 standard drinks of alcohol    Comment: 1 a month   Drug use: No    Family History  Problem Relation Age of Onset   Heart disease Mother    Diabetes Mother    Cervical cancer Mother    Heart attack Mother    Lung cancer Father    Alcoholism Father    Colon cancer Father    Diabetes Brother    Throat cancer Brother    Cancer Other        lung cancer   Heart disease Other    Hypertension Other    Arthritis Other    Alcohol abuse Other    Rectal cancer Neg Hx    Stomach cancer Neg Hx     Objective:  There were no vitals filed for this visit. There is  no height or weight on file to calculate BMI.  Physical Exam Constitutional:      Appearance: Normal appearance.  HENT:     Head: Normocephalic and atraumatic.     Right Ear: Tympanic membrane normal.     Left Ear: Tympanic membrane normal.     Nose: Nose normal.     Mouth/Throat:     Mouth: Mucous membranes are moist.  Eyes:     Extraocular Movements: Extraocular movements intact.     Conjunctiva/sclera: Conjunctivae normal.     Pupils: Pupils are equal, round, and reactive to light.  Cardiovascular:     Rate and Rhythm: Normal rate and regular rhythm.  Heart sounds: No murmur heard.    No friction rub. No gallop.  Pulmonary:     Effort: Pulmonary effort is normal.     Breath sounds: Normal breath sounds.  Abdominal:     General: Abdomen is flat.     Palpations: Abdomen is soft.  Musculoskeletal:        General: Normal range of motion.  Skin:    General: Skin is warm and dry.  Neurological:     General: No focal deficit present.     Mental Status: She is alert and oriented to person, place, and time.  Psychiatric:        Mood and Affect: Mood normal.     Lab Results: Lab Results  Component Value Date   WBC 6.7 04/26/2022   HGB 12.5 04/26/2022   HCT 37.7 04/26/2022   MCV 96.9 04/26/2022   PLT 218 04/26/2022    Lab Results  Component Value Date   CREATININE 0.71 04/26/2022   BUN 18 04/26/2022   NA 142 04/26/2022   K 3.6 04/26/2022   CL 102 04/26/2022   CO2 30 04/26/2022    Lab Results  Component Value Date   ALT 12 04/26/2022   AST 15 04/26/2022   ALKPHOS 73 03/21/2022   BILITOT 0.4 04/26/2022     Assessment & Plan:  # Osteomyelitis involving the fifth metatarsal resection site with surrounding small abscesses. # Early osteomyelitis involving the proximal phalanx of the fifth toe  -Completed Doxy and Augmentin x 7 weeks EOT around 12/7. She had some lateral foot shooting pain at 12/6 visit, which I felt was nerve pain. X ray 12/7 showed similar  appearance of fifth metatarsal partial resection compared to 11/17 noted there.  Cortical reaction and irregularity at the resection edge may be due to wound healing although progressive osteomyelitis cannot be excluded.  ESR and CRP stable on 12/6 visit and clinically wound appeared healed as such further imaging held. If there is clinical concern recommend MRI for further evaluation.. Seen by podiatry on 12/20 and recc pads and moleskin. She has started wearing Scholl's inserts which have resolved the pain. No concern clinically for ongoing infection at this visit.  Plan: -Labs today off of abx -Continue to f/u podiatry -Follow-up 3 months  I have personally spent 35 minutes involved in face-to-face and non-face-to-face activities for this patient on the day of the visit. Professional time spent includes the following activities: Preparing to see the patient (review of tests), Obtaining and/or reviewing separately obtained history (admission/discharge record), Performing a medically appropriate examination and/or evaluation , Ordering medications/tests/procedures, referring and communicating with other health care professionals, Documenting clinical information in the EMR, Independently interpreting results (not separately reported), Communicating results to the patient/family/caregiver, Counseling and educating the patient/family/caregiver and Care coordination (not separately reported).    Laurice Record, MD [facility]   05/29/22  6:08 AM

## 2022-05-30 LAB — CBC WITH DIFFERENTIAL/PLATELET
Absolute Monocytes: 488 cells/uL (ref 200–950)
Basophils Absolute: 49 cells/uL (ref 0–200)
Basophils Relative: 0.8 %
Eosinophils Absolute: 61 cells/uL (ref 15–500)
Eosinophils Relative: 1 %
HCT: 38.3 % (ref 35.0–45.0)
Hemoglobin: 13.1 g/dL (ref 11.7–15.5)
Lymphs Abs: 1324 cells/uL (ref 850–3900)
MCH: 32.2 pg (ref 27.0–33.0)
MCHC: 34.2 g/dL (ref 32.0–36.0)
MCV: 94.1 fL (ref 80.0–100.0)
MPV: 9.6 fL (ref 7.5–12.5)
Monocytes Relative: 8 %
Neutro Abs: 4179 cells/uL (ref 1500–7800)
Neutrophils Relative %: 68.5 %
Platelets: 230 10*3/uL (ref 140–400)
RBC: 4.07 10*6/uL (ref 3.80–5.10)
RDW: 11.8 % (ref 11.0–15.0)
Total Lymphocyte: 21.7 %
WBC: 6.1 10*3/uL (ref 3.8–10.8)

## 2022-05-30 LAB — COMPREHENSIVE METABOLIC PANEL
AG Ratio: 1.7 (calc) (ref 1.0–2.5)
ALT: 10 U/L (ref 6–29)
AST: 15 U/L (ref 10–35)
Albumin: 4.7 g/dL (ref 3.6–5.1)
Alkaline phosphatase (APISO): 87 U/L (ref 37–153)
BUN: 18 mg/dL (ref 7–25)
CO2: 32 mmol/L (ref 20–32)
Calcium: 10.1 mg/dL (ref 8.6–10.4)
Chloride: 99 mmol/L (ref 98–110)
Creat: 0.72 mg/dL (ref 0.60–1.00)
Globulin: 2.8 g/dL (calc) (ref 1.9–3.7)
Glucose, Bld: 85 mg/dL (ref 65–99)
Potassium: 3.3 mmol/L — ABNORMAL LOW (ref 3.5–5.3)
Sodium: 141 mmol/L (ref 135–146)
Total Bilirubin: 0.6 mg/dL (ref 0.2–1.2)
Total Protein: 7.5 g/dL (ref 6.1–8.1)

## 2022-05-30 LAB — SEDIMENTATION RATE: Sed Rate: 33 mm/h — ABNORMAL HIGH (ref 0–30)

## 2022-05-30 LAB — C-REACTIVE PROTEIN: CRP: 3.8 mg/L (ref <8.0)

## 2022-05-31 ENCOUNTER — Other Ambulatory Visit: Payer: Self-pay | Admitting: Internal Medicine

## 2022-05-31 DIAGNOSIS — Z1231 Encounter for screening mammogram for malignant neoplasm of breast: Secondary | ICD-10-CM

## 2022-06-07 ENCOUNTER — Other Ambulatory Visit: Payer: Self-pay

## 2022-06-12 DIAGNOSIS — M79672 Pain in left foot: Secondary | ICD-10-CM | POA: Diagnosis not present

## 2022-06-13 ENCOUNTER — Other Ambulatory Visit: Payer: Self-pay

## 2022-06-13 ENCOUNTER — Ambulatory Visit (INDEPENDENT_AMBULATORY_CARE_PROVIDER_SITE_OTHER): Payer: Medicare Other | Admitting: Obstetrics and Gynecology

## 2022-06-13 ENCOUNTER — Encounter: Payer: Self-pay | Admitting: Obstetrics and Gynecology

## 2022-06-13 VITALS — BP 138/58 | HR 66

## 2022-06-13 DIAGNOSIS — L539 Erythematous condition, unspecified: Secondary | ICD-10-CM

## 2022-06-13 NOTE — Progress Notes (Signed)
NEW GYNECOLOGY PATIENT Patient name: Lauren [name] MRN [id number]  Date of birth: 02-01-1946 Chief Complaint:   Rash     History:  Lauren [name] is a 77 y.o. G2P2002 being seen today for 6 weeks of bilateral groin redness. Came out of nowhere. No itching or bumps noticed. Ahs tried 2 different creams from primary without improvement. No changes in clothes, wash, detergents, etc.     Past Medical History:  Diagnosis Date   Anxiety    Arthritis    Carotid stenosis 10/08/2013   Cervical disc disease    Depression    Diverticulitis    Diverticulosis    Frequent headaches    Hypertension    does not now per pt.   Hypothyroidism 10/26/2015   Impaired glucose tolerance 10/08/2013   normal HgA1C   Neck pain    Psoriasis     Past Surgical History:  Procedure Laterality Date   BREAST BIOPSY Left 2011   benign    CERVICAL FUSION     approx 1980   CHOLECYSTECTOMY     COLONOSCOPY     I & D EXTREMITY Right 03/07/2022   Procedure: IRRIGATION AND DEBRIDEMENT OF FOOT WITH BONE BIOPSY;  Surgeon: Trula Slade, DPM;  Location: [facility];  Service: Podiatry;  Laterality: Right;   POSTERIOR CERVICAL FUSION/FORAMINOTOMY N/A 11/05/2014   Procedure: Cervical four to Cervical seven  Posterior Cervical Fusion with lateral mass fixation;  Surgeon: Eustace Moore, MD;  Location: [facility];  Service: Neurosurgery;  Laterality: N/A;   C4 - C7 Posterior Cervical Fusion with lateral mass fixation   TOTAL ABDOMINAL HYSTERECTOMY  1998   Fibroid   UPPER GASTROINTESTINAL ENDOSCOPY      Current Outpatient Medications on File Prior to Visit  Medication Sig Dispense Refill   clotrimazole-betamethasone (LOTRISONE) cream Apply 1 Application topically daily. 30 g 1   ketoconazole (NIZORAL) 2 % cream Apply 1 Application topically daily. 30 g 1   acetaminophen-codeine (TYLENOL #3) 300-30 MG tablet Take 1-2 tablets by mouth every 6 (six) hours as needed for moderate pain. 15 tablet 0   ALPRAZolam (XANAX)  1 MG tablet TAKE 1/2 TO 1 TABLET BY MOUTH TWICE DAILY AS NEEDED 60 tablet 0   amLODipine (NORVASC) 5 MG tablet TAKE 1 TABLET BY MOUTH DAILY (Patient taking differently: Take 5 mg by mouth daily.) 100 tablet 2   amoxicillin-clavulanate (AUGMENTIN) 875-125 MG tablet Take 1 tablet by mouth 2 (two) times daily. 20 tablet 0   aspirin EC 325 MG tablet Take 325 mg by mouth daily.     Cholecalciferol (D3 ADULT PO) Take 1 tablet by mouth daily.     citalopram (CELEXA) 10 MG tablet Take 1 tablet (10 mg total) by mouth daily. 90 tablet 3   cyanocobalamin 100 MCG tablet Take 100 mcg by mouth daily.     famotidine (PEPCID) 40 MG tablet Take 40 mg by mouth daily.     hydrochlorothiazide (MICROZIDE) 12.5 MG capsule TAKE 1 CAPSULE BY MOUTH  DAILY (Patient taking differently: Take 12.5 mg by mouth daily.) 90 capsule 2   ibuprofen (ADVIL) 800 MG tablet Take 1 tablet (800 mg total) by mouth 2 (two) times daily as needed for moderate pain. 60 tablet 4   levothyroxine (SYNTHROID) 50 MCG tablet TAKE 1 TABLET BY MOUTH DAILY (Patient taking differently: Take 50 mcg by mouth daily.) 100 tablet 2   mupirocin ointment (BACTROBAN) 2 % SMARTSIG:1 sparingly Topical Daily     nystatin-triamcinolone  ointment (MYCOLOG) SMARTSIG:sparingly Topical Twice Daily     oxyCODONE-acetaminophen (PERCOCET/ROXICET) 5-325 MG tablet Take 1 tablet by mouth every 6 (six) hours as needed for severe pain. (Patient not taking: Reported on 05/29/2022) 10 tablet 0   pravastatin (PRAVACHOL) 20 MG tablet Take 1 tablet (20 mg total) by mouth daily. 90 tablet 3   RESTASIS 0.05 % ophthalmic emulsion Place 1 drop into both eyes 2 (two) times daily.     zolpidem (AMBIEN) 5 MG tablet TAKE 1 TABLET BY MOUTH AT  BEDTIME AS NEEDED FOR SLEEP 90 tablet 1   No current facility-administered medications on file prior to visit.    Allergies  Allergen Reactions   Celebrex [Celecoxib] Itching and Rash    Social History:  reports that she has never smoked. She has  never used smokeless tobacco. She reports current alcohol use. She reports that she does not use drugs.  Family History  Problem Relation Age of Onset   Heart disease Mother    Diabetes Mother    Cervical cancer Mother    Heart attack Mother    Lung cancer Father    Alcoholism Father    Colon cancer Father    Diabetes Brother    Throat cancer Brother    Cancer Other        lung cancer   Heart disease Other    Hypertension Other    Arthritis Other    Alcohol abuse Other    Rectal cancer Neg Hx    Stomach cancer Neg Hx     The following portions of the patient's history were reviewed and updated as appropriate: allergies, current medications, past family history, past medical history, past social history, past surgical history and problem list.  Review of Systems Pertinent items noted in HPI and remainder of comprehensive ROS otherwise negative.  Physical Exam:  BP (!) 138/58   Pulse 66   LMP 05/22/1996  Physical Exam Vitals and nursing note reviewed. Exam conducted with a chaperone present.  Constitutional:      Appearance: Normal appearance.  Cardiovascular:     Rate and Rhythm: Normal rate.  Pulmonary:     Effort: Pulmonary effort is normal.     Breath sounds: Normal breath sounds.  Genitourinary:    General: Normal vulva.     Comments: Erythematous groin folds bilaterally, nontender, no satellite lesions, no plaques, no vesicles Neurological:     General: No focal deficit present.     Mental Status: She is alert and oriented to person, place, and time.  Psychiatric:        Mood and Affect: Mood normal.        Behavior: Behavior normal.        Thought Content: Thought content normal.        Judgment: Judgment normal.        Assessment and Plan:   1. Erythema Trial of nystatin on area and general vulvar. If no improvement with nystatin, referral to derm.    Routine preventative health maintenance measures emphasized. Please refer to After Visit Summary  for other counseling recommendations.   Follow-up: No follow-ups on file.      Darliss Cheney, MD Obstetrician & Gynecologist, [facility] [employer], [place]

## 2022-06-14 ENCOUNTER — Telehealth: Payer: Self-pay | Admitting: *Deleted

## 2022-06-14 MED ORDER — NYSTATIN 100000 UNIT/GM EX CREA: 1.0000 | TOPICAL_CREAM | Freq: Two times a day (BID) | CUTANEOUS | 1 refills | Status: DC

## 2022-06-14 NOTE — Telephone Encounter (Signed)
Pt left voicemail message stating that when she was seen in office yesterday, the doctor recommended that she use Nystatin. Upon arriving home she found that she is very low on the ointment and now requests a refill. Dr. Currie Paris approved Rx and it was e-prescribed as requested. Pt was called and informed.

## 2022-06-20 ENCOUNTER — Other Ambulatory Visit: Payer: Self-pay | Admitting: Internal Medicine

## 2022-06-21 NOTE — Telephone Encounter (Signed)
Please refill as per office routine med refill policy (all routine meds to be refilled for 3 mo or monthly (per pt preference) up to one year from last visit, then month to month grace period for 3 mo, then further med refills will have to be denied)

## 2022-06-22 ENCOUNTER — Ambulatory Visit: Payer: Medicare Other | Admitting: Internal Medicine

## 2022-07-20 ENCOUNTER — Ambulatory Visit
Admission: RE | Admit: 2022-07-20 | Discharge: 2022-07-20 | Disposition: A | Discharge status: Home / Self Care | Payer: Medicare Other | Source: Ambulatory Visit | Attending: Internal Medicine | Admitting: Internal Medicine

## 2022-07-20 DIAGNOSIS — Z1231 Encounter for screening mammogram for malignant neoplasm of breast: Secondary | ICD-10-CM

## 2022-07-25 ENCOUNTER — Ambulatory Visit (INDEPENDENT_AMBULATORY_CARE_PROVIDER_SITE_OTHER): Payer: Medicare Other | Admitting: Podiatry

## 2022-07-25 ENCOUNTER — Ambulatory Visit: Payer: Medicare Other

## 2022-07-25 DIAGNOSIS — R609 Edema, unspecified: Secondary | ICD-10-CM

## 2022-07-25 DIAGNOSIS — M21621 Bunionette of right foot: Secondary | ICD-10-CM

## 2022-07-25 MED ORDER — AMOXICILLIN-POT CLAVULANATE 875-125 MG PO TABS: 1.0000 | ORAL_TABLET | Freq: Two times a day (BID) | ORAL | 0 refills | Status: DC

## 2022-07-25 NOTE — Progress Notes (Signed)
Subjective: Chief Complaint  Patient presents with   Foot Problem    Right foot, warm to the touch, swelling, pain located iat the surgical site    77 year old female presents the office with above concerns.  She states that 45 minutes ago prior to her walking into the office she took her shoe off when she noticed purple discoloration to the foot.  Since then it has "cooled off".  Open that she has been doing well she gets intermittent discomfort but she has been using Dr. Felicie Morn inserts which helps.  Objective: AAO x3, NAD DP/PT pulses palpable bilaterally, CRT less than 3 seconds Incision well-healed from prior surgery.  There is mild edema present along the fifth MPJ area with no significant signs of cellulitis. There is no fluctuance or crepitation.  There is no malodor.  No open lesions.  No fluctuance or crepitation. No pain with calf compression, swelling, warmth, erythema    Assessment: Right foot 5th MTPJ pain  Plan: -All treatment options discussed with the patient including all alternatives, risks, complications.  -X-rays were obtained reviewed.  3 views of the right foot were obtained. -Patient encouraged to call the office with any questions, concerns, change in symptoms.  Remodeling of the resection margins of the foot metatarsal head as well as on the bone biopsy site.  No soft tissue edema. -Given her history we will order new blood work to check for infection.  Order CBC, sed rate, CRP.  As a precaution we will restart antibiotics.  Prescribed Augmentin.  Discussed ice, elevation. -Monitor for any clinical signs or symptoms of infection and directed to call the office immediately should any occur or go to the ER.  Return in about 3 weeks (around 08/15/2022).  Trula Slade DPM

## 2022-07-26 LAB — CBC WITH DIFFERENTIAL/PLATELET
Basophils Absolute: 0.1 10*3/uL (ref 0.0–0.2)
Basos: 1 %
EOS (ABSOLUTE): 0.1 10*3/uL (ref 0.0–0.4)
Eos: 1 %
Hematocrit: 36.6 % (ref 34.0–46.6)
Hemoglobin: 11.9 g/dL (ref 11.1–15.9)
Immature Grans (Abs): 0 10*3/uL (ref 0.0–0.1)
Immature Granulocytes: 0 %
Lymphocytes Absolute: 1.8 10*3/uL (ref 0.7–3.1)
Lymphs: 26 %
MCH: 30.7 pg (ref 26.6–33.0)
MCHC: 32.5 g/dL (ref 31.5–35.7)
MCV: 94 fL (ref 79–97)
Monocytes Absolute: 0.5 10*3/uL (ref 0.1–0.9)
Monocytes: 7 %
Neutrophils Absolute: 4.5 10*3/uL (ref 1.4–7.0)
Neutrophils: 65 %
Platelets: 222 10*3/uL (ref 150–450)
RBC: 3.88 x10E6/uL (ref 3.77–5.28)
RDW: 12.7 % (ref 11.7–15.4)
WBC: 7 10*3/uL (ref 3.4–10.8)

## 2022-07-26 LAB — C-REACTIVE PROTEIN: CRP: 2 mg/L (ref 0–10)

## 2022-07-26 LAB — SEDIMENTATION RATE: Sed Rate: 10 mm/hr (ref 0–40)

## 2022-07-27 ENCOUNTER — Encounter (HOSPITAL_COMMUNITY): Payer: Self-pay | Admitting: Emergency Medicine

## 2022-07-27 ENCOUNTER — Ambulatory Visit (HOSPITAL_COMMUNITY)
Admission: EM | Admit: 2022-07-27 | Discharge: 2022-07-27 | Disposition: A | Discharge status: Home / Self Care | Payer: Medicare Other | Attending: Internal Medicine | Admitting: Internal Medicine

## 2022-07-27 ENCOUNTER — Other Ambulatory Visit: Payer: Self-pay

## 2022-07-27 DIAGNOSIS — L600 Ingrowing nail: Secondary | ICD-10-CM | POA: Diagnosis not present

## 2022-07-27 NOTE — ED Triage Notes (Signed)
Patient has a right little finger that has had issues for 8 weeks.  Patient reports initially there was a blister at base of nail.  Now redness around nail , discolored nail, end of finger is red, reported pus like drainage.    Has neosporin, peroxide  Patient is taking an antibiotic secondary to right little toe possible infection

## 2022-07-27 NOTE — ED Provider Notes (Signed)
[facility]    CSN: [id number] Arrival date & time: 07/27/22  0856      History   Chief Complaint Chief Complaint  Patient presents with   finger infection    HPI Lauren [name] is a 77 y.o. female comes to the urgent care with painful swelling of the right little finger.  Patient's symptoms started several weeks ago.  Started off as a blister and has been worsening.  She is noticed some purulent discharge from the nail fold over the past few weeks.  She has been using hydrogen peroxide with no improvement in his symptoms.  Patient is currently on Augmentin after having a podiatry procedure.  No swelling of the finger.   HPI  Past Medical History:  Diagnosis Date   Anxiety    Arthritis    Carotid stenosis 10/08/2013   Cervical disc disease    Depression    Diverticulitis    Diverticulosis    Frequent headaches    Hypertension    does not now per pt.   Hypothyroidism 10/26/2015   Impaired glucose tolerance 10/08/2013   normal HgA1C   Neck pain    Psoriasis     Patient Active Problem List   Diagnosis Date Noted   Right foot pain 05/03/2022   HLD (hyperlipidemia) 05/03/2022   Medication monitoring encounter 03/23/2022   Acute osteomyelitis of metatarsal bone of right foot ([place]) 03/07/2022   Hypokalemia 03/07/2022   GERD (gastroesophageal reflux disease) 03/07/2022   Anterior neck pain 12/23/2021   Neck mass 12/23/2021   Cough 10/27/2021   Recurrent falls 09/04/2021   Headache 07/19/2021   Unilateral primary osteoarthritis, left knee 06/23/2020   Chondromalacia patellae, left knee 06/01/2020   Synovitis of left knee 01/14/2020   Hyperglycemia 12/23/2018   Hematuria 12/23/2018   Vitamin D deficiency 10/25/2017   Rash 05/24/2017   Obesity 05/24/2017   Hypothyroidism 10/26/2015   Peripheral edema 10/22/2015   Cervical dystonia 07/14/2015   Weakness generalized 11/16/2014   Insomnia 11/16/2014   S/P cervical spinal fusion 11/05/2014   Essential  hypertension 10/14/2014   Fracture, cervical vertebra ([place]) 08/30/2014   Encounter for well adult exam with abnormal findings 10/08/2013   Carotid stenosis 10/08/2013   depression and anxiety     Diverticulitis    Anxiety    Cervical disc disease    Diverticulitis of colon (without mention of hemorrhage)(562.11) 05/10/2013    Past Surgical History:  Procedure Laterality Date   BREAST BIOPSY Left 2011   benign    CERVICAL FUSION     approx 1980   CHOLECYSTECTOMY     COLONOSCOPY     I & D EXTREMITY Right 03/07/2022   Procedure: IRRIGATION AND DEBRIDEMENT OF FOOT WITH BONE BIOPSY;  Surgeon: Trula Slade, DPM;  Location: [facility];  Service: Podiatry;  Laterality: Right;   POSTERIOR CERVICAL FUSION/FORAMINOTOMY N/A 11/05/2014   Procedure: Cervical four to Cervical seven  Posterior Cervical Fusion with lateral mass fixation;  Surgeon: Eustace Moore, MD;  Location: [facility];  Service: Neurosurgery;  Laterality: N/A;   C4 - C7 Posterior Cervical Fusion with lateral mass fixation   TOTAL ABDOMINAL HYSTERECTOMY  1998   Fibroid   UPPER GASTROINTESTINAL ENDOSCOPY      OB History     Gravida  2   Para  2   Term  2   Preterm  0   AB  0   Living  2      SAB  0   IAB  0   Ectopic  0   Multiple  0   Live Births  2            Home Medications    Prior to Admission medications   Medication Sig Start Date End Date Taking? Authorizing Provider  acetaminophen-codeine (TYLENOL #3) 300-30 MG tablet Take 1-2 tablets by mouth every 6 (six) hours as needed for moderate pain. 07/25/21   Barrett Henle, MD  ALPRAZolam Duanne Moron) 1 MG tablet TAKE 1/2 TO 1 TABLET BY MOUTH TWICE DAILY AS NEEDED 04/25/22   Biagio Borg, MD  amLODipine (NORVASC) 5 MG tablet TAKE 1 TABLET BY MOUTH DAILY Patient taking differently: Take 5 mg by mouth daily. 02/06/22   Biagio Borg, MD  amoxicillin-clavulanate (AUGMENTIN) 875-125 MG tablet Take 1 tablet by mouth 2 (two) times daily. 05/04/22    Gatha Mayer, MD  amoxicillin-clavulanate (AUGMENTIN) 875-125 MG tablet Take 1 tablet by mouth 2 (two) times daily. 07/25/22   Trula Slade, DPM  aspirin EC 325 MG tablet Take 325 mg by mouth daily.    [provider]  Cholecalciferol (D3 ADULT PO) Take 1 tablet by mouth daily.    [provider]  citalopram (CELEXA) 10 MG tablet Take 1 tablet (10 mg total) by mouth daily. 05/02/22 05/02/23  Biagio Borg, MD  clotrimazole-betamethasone (LOTRISONE) cream Apply 1 Application topically daily. 05/10/22   Biagio Borg, MD  cyanocobalamin 100 MCG tablet Take 100 mcg by mouth daily.    [provider]  famotidine (PEPCID) 40 MG tablet Take 40 mg by mouth daily. 01/17/22   [provider]  hydrochlorothiazide (MICROZIDE) 12.5 MG capsule TAKE 1 CAPSULE BY MOUTH DAILY 06/21/22   Biagio Borg, MD  ibuprofen (ADVIL) 800 MG tablet Take 1 tablet (800 mg total) by mouth 2 (two) times daily as needed for moderate pain. 03/13/22   Marybelle Killings, MD  ketoconazole (NIZORAL) 2 % cream Apply 1 Application topically daily. 05/02/22   Biagio Borg, MD  levothyroxine (SYNTHROID) 50 MCG tablet TAKE 1 TABLET BY MOUTH DAILY Patient taking differently: Take 50 mcg by mouth daily. 02/06/22   Biagio Borg, MD  mupirocin ointment (BACTROBAN) 2 % SMARTSIG:1 sparingly Topical Daily 03/27/22   [provider]  nystatin cream (MYCOSTATIN) Apply 1 Application topically 2 (two) times daily. 06/14/22   Darliss Cheney, MD  nystatin-triamcinolone ointment Lilyan Gilford) SMARTSIG:sparingly Topical Twice Daily 03/24/22   [provider]  oxyCODONE-acetaminophen (PERCOCET/ROXICET) 5-325 MG tablet Take 1 tablet by mouth every 6 (six) hours as needed for severe pain. Patient not taking: Reported on 07/27/2022 04/24/22   Trula Slade, DPM  pravastatin (PRAVACHOL) 20 MG tablet Take 1 tablet (20 mg total) by mouth daily. 05/11/22   Biagio Borg, MD  RESTASIS 0.05 % ophthalmic  emulsion Place 1 drop into both eyes 2 (two) times daily. 04/18/19   [provider]  zolpidem (AMBIEN) 5 MG tablet TAKE 1 TABLET BY MOUTH AT  BEDTIME AS NEEDED FOR SLEEP 05/31/22   Biagio Borg, MD    Family History Family History  Problem Relation Age of Onset   Heart disease Mother    Diabetes Mother    Cervical cancer Mother    Heart attack Mother    Lung cancer Father    Alcoholism Father    Colon cancer Father    Diabetes Brother    Throat cancer Brother    Cancer Other  lung cancer   Heart disease Other    Hypertension Other    Arthritis Other    Alcohol abuse Other    Rectal cancer Neg Hx    Stomach cancer Neg Hx     Social History Social History   Tobacco Use   Smoking status: Never   Smokeless tobacco: Never  Vaping Use   Vaping Use: Never used  Substance Use Topics   Alcohol use: Not Currently   Drug use: No     Allergies   Celebrex [celecoxib]   Review of Systems Review of Systems  Skin:  Positive for color change and wound. Negative for rash.     Physical Exam Triage Vital Signs ED Triage Vitals  Enc Vitals Group     BP 07/27/22 1008 (!) 151/77     Pulse Rate 07/27/22 1008 61     Resp 07/27/22 1008 18     Temp 07/27/22 1008 97.8 F (36.6 C)     Temp Source 07/27/22 1008 Oral     SpO2 07/27/22 1008 97 %     Weight --      Height --      Head Circumference --      Peak Flow --      Pain Score 07/27/22 1006 0     Pain Loc --      Pain Edu? --      Excl. in [place]? --    No data found.  Updated Vital Signs BP (!) 151/77 (BP Location: Left Arm)   Pulse 61   Temp 97.8 F (36.6 C) (Oral)   Resp 18   LMP 05/22/1996   SpO2 97%   Visual Acuity Right Eye Distance:   Left Eye Distance:   Bilateral Distance:    Right Eye Near:   Left Eye Near:    Bilateral Near:     Physical Exam Vitals and nursing note reviewed.  Constitutional:      General: She is not in acute distress.    Appearance: She is not ill-appearing.   Cardiovascular:     Rate and Rhythm: Normal rate and regular rhythm.  Musculoskeletal:        General: Normal range of motion.  Skin:    Comments: Tender swelling of the lateral nail fold of the right little finger.  No fluctuance noted.  Erythema over the lateral nail fold.  No tenderness on palpation of the pulp of the little finger.  Neurological:     Mental Status: She is alert.      UC Treatments / Results  Labs (all labs ordered are listed, but only abnormal results are displayed) Labs Reviewed - No data to display  EKG   Radiology No results found.  Procedures Procedures (including critical care time)  Medications Ordered in UC Medications - No data to display  Initial Impression / Assessment and Plan / UC Course  I have reviewed the triage vital signs and the nursing notes.  Pertinent labs & imaging results that were available during my care of the patient were reviewed by me and considered in my medical decision making (see chart for details).     1.  Infected ingrown nail of the right little finger: Patient is advised to stop using hydrogen peroxide to clean the wound Warm salt water soaks as needed Patient advised to continue current antibiotics (Augmentin) Tylenol as needed for pain and/or fever Return precautions given. Final Clinical Impressions(s) / UC Diagnoses   Final diagnoses:  Ingrown nail of right little finger     Discharge Instructions      Please soak your finger and salt water 2-3 times a day Take Tylenol as needed for pain Continue Augmentin as prescribed by the podiatrist Return to urgent care if you have worsening symptoms.   ED Prescriptions   None    PDMP not reviewed this encounter.   Chase Picket, MD 07/27/22 1055

## 2022-07-27 NOTE — Discharge Instructions (Signed)
Please soak your finger and salt water 2-3 times a day Take Tylenol as needed for pain Continue Augmentin as prescribed by the podiatrist Return to urgent care if you have worsening symptoms.

## 2022-08-15 ENCOUNTER — Ambulatory Visit: Payer: Medicare Other | Admitting: Podiatry

## 2022-08-15 ENCOUNTER — Other Ambulatory Visit: Payer: Self-pay | Admitting: Internal Medicine

## 2022-08-29 ENCOUNTER — Other Ambulatory Visit: Payer: Self-pay

## 2022-08-29 ENCOUNTER — Ambulatory Visit (INDEPENDENT_AMBULATORY_CARE_PROVIDER_SITE_OTHER): Payer: Medicare Other | Admitting: Internal Medicine

## 2022-08-29 ENCOUNTER — Encounter: Payer: Self-pay | Admitting: Internal Medicine

## 2022-08-29 VITALS — BP 110/69 | HR 62 | Resp 16 | Ht 64.0 in | Wt 139.0 lb

## 2022-08-29 DIAGNOSIS — M869 Osteomyelitis, unspecified: Secondary | ICD-10-CM | POA: Diagnosis not present

## 2022-08-29 NOTE — Progress Notes (Signed)
Patient: Lauren [name]  DOB: August 29, 1945 MRN: [id number] PCP: Corwin Levins, MD    Patient Active Problem List   Diagnosis Date Noted   Right foot pain 05/03/2022   HLD (hyperlipidemia) 05/03/2022   Medication monitoring encounter 03/23/2022   Acute osteomyelitis of metatarsal bone of right foot 03/07/2022   Hypokalemia 03/07/2022   GERD (gastroesophageal reflux disease) 03/07/2022   Anterior neck pain 12/23/2021   Neck mass 12/23/2021   Cough 10/27/2021   Recurrent falls 09/04/2021   Headache 07/19/2021   Unilateral primary osteoarthritis, left knee 06/23/2020   Chondromalacia patellae, left knee 06/01/2020   Synovitis of left knee 01/14/2020   Hyperglycemia 12/23/2018   Hematuria 12/23/2018   Vitamin D deficiency 10/25/2017   Rash 05/24/2017   Obesity 05/24/2017   Hypothyroidism 10/26/2015   Peripheral edema 10/22/2015   Cervical dystonia 07/14/2015   Weakness generalized 11/16/2014   Insomnia 11/16/2014   S/P cervical spinal fusion 11/05/2014   Essential hypertension 10/14/2014   Fracture, cervical vertebra 08/30/2014   Encounter for well adult exam with abnormal findings 10/08/2013   Carotid stenosis 10/08/2013   depression and anxiety     Diverticulitis    Anxiety    Cervical disc disease    Diverticulitis of colon (without mention of hemorrhage)(562.11) 05/10/2013     Subjective:  Lauren [name] is a32 year old female with past medical history as below including psoriasis, fifth metatarsal head resection on 02/10/2022 with Dr. Charlsie Merles podiatry on 1012 postop visit there is significant pain at surgical site. She was started on doxycycline. Pain worsened and she was admitted to the hospital 10/17-19. MRI on 10/17 showed osteomyelitis of the fifth metatarsal resection site, surrounding fluid collection worrisome for small abscess. Small focus of early osteomyelitis involving the proximal phalanx of the fifth toe. Diffuse cellulitis and mild fasciitis. On 1017  underwent I&D and bone biopsy. Or cultures with no growth, path fifth metatarsal negative for osteomyelitis. She was discharged Doxy with Cipro and Augmentin x 2 weeks. Seen by podiatry on 11/2 Dr. Sonda Primes and recommended treatment for another 4 weeks to complete 6 weeks of antibiotics for osteomyelitis.   05/29/22: pt reports foot feels better, pain has resolved. She is wearing Dr. Margart Sickles inserts which have helped. No concerns today.  Today 08/29/22: No issues with her foot. She has long standing fatigue for which she will see PCP.  Review of Systems  All other systems reviewed and are negative.   Past Medical History:  Diagnosis Date   Anxiety    Arthritis    Carotid stenosis 10/08/2013   Cervical disc disease    Depression    Diverticulitis    Diverticulosis    Frequent headaches    Hypertension    does not now per pt.   Hypothyroidism 10/26/2015   Impaired glucose tolerance 10/08/2013   normal HgA1C   Neck pain    Psoriasis     Outpatient Medications Prior to Visit  Medication Sig Dispense Refill   acetaminophen-codeine (TYLENOL #3) 300-30 MG tablet Take 1-2 tablets by mouth every 6 (six) hours as needed for moderate pain. 15 tablet 0   ALPRAZolam (XANAX) 1 MG tablet TAKE 1/2 TO 1 TABLET BY MOUTH TWICE DAILY AS NEEDED 60 tablet 2   amLODipine (NORVASC) 5 MG tablet TAKE 1 TABLET BY MOUTH DAILY (Patient taking differently: Take 5 mg by mouth daily.) 100 tablet 2   amoxicillin-clavulanate (AUGMENTIN) 875-125 MG tablet Take 1 tablet by mouth 2 (two) times  daily. 20 tablet 0   amoxicillin-clavulanate (AUGMENTIN) 875-125 MG tablet Take 1 tablet by mouth 2 (two) times daily. 20 tablet 0   aspirin EC 325 MG tablet Take 325 mg by mouth daily.     Cholecalciferol (D3 ADULT PO) Take 1 tablet by mouth daily.     citalopram (CELEXA) 10 MG tablet Take 1 tablet (10 mg total) by mouth daily. 90 tablet 3   clotrimazole-betamethasone (LOTRISONE) cream Apply 1 Application topically daily. 30 g 1    cyanocobalamin 100 MCG tablet Take 100 mcg by mouth daily.     famotidine (PEPCID) 40 MG tablet Take 40 mg by mouth daily.     hydrochlorothiazide (MICROZIDE) 12.5 MG capsule TAKE 1 CAPSULE BY MOUTH DAILY 70 capsule 4   ibuprofen (ADVIL) 800 MG tablet Take 1 tablet (800 mg total) by mouth 2 (two) times daily as needed for moderate pain. 60 tablet 4   ketoconazole (NIZORAL) 2 % cream Apply 1 Application topically daily. 30 g 1   levothyroxine (SYNTHROID) 50 MCG tablet TAKE 1 TABLET BY MOUTH DAILY (Patient taking differently: Take 50 mcg by mouth daily.) 100 tablet 2   mupirocin ointment (BACTROBAN) 2 % SMARTSIG:1 sparingly Topical Daily     nystatin cream (MYCOSTATIN) Apply 1 Application topically 2 (two) times daily. 30 g 1   nystatin-triamcinolone ointment (MYCOLOG) SMARTSIG:sparingly Topical Twice Daily     oxyCODONE-acetaminophen (PERCOCET/ROXICET) 5-325 MG tablet Take 1 tablet by mouth every 6 (six) hours as needed for severe pain. (Patient not taking: Reported on 07/27/2022) 10 tablet 0   pravastatin (PRAVACHOL) 20 MG tablet Take 1 tablet (20 mg total) by mouth daily. 90 tablet 3   RESTASIS 0.05 % ophthalmic emulsion Place 1 drop into both eyes 2 (two) times daily.     zolpidem (AMBIEN) 5 MG tablet TAKE 1 TABLET BY MOUTH AT  BEDTIME AS NEEDED FOR SLEEP 90 tablet 1   No facility-administered medications prior to visit.     Allergies  Allergen Reactions   Celebrex [Celecoxib] Itching and Rash    Social History   Tobacco Use   Smoking status: Never   Smokeless tobacco: Never  Vaping Use   Vaping Use: Never used  Substance Use Topics   Alcohol use: Not Currently   Drug use: No    Family History  Problem Relation Age of Onset   Heart disease Mother    Diabetes Mother    Cervical cancer Mother    Heart attack Mother    Lung cancer Father    Alcoholism Father    Colon cancer Father    Diabetes Brother    Throat cancer Brother    Cancer Other        lung cancer   Heart  disease Other    Hypertension Other    Arthritis Other    Alcohol abuse Other    Rectal cancer Neg Hx    Stomach cancer Neg Hx     Objective:  There were no vitals filed for this visit. There is no height or weight on file to calculate BMI.  Physical Exam Constitutional:      Appearance: Normal appearance.  HENT:     Head: Normocephalic and atraumatic.     Right Ear: Tympanic membrane normal.     Left Ear: Tympanic membrane normal.     Nose: Nose normal.     Mouth/Throat:     Mouth: Mucous membranes are moist.  Eyes:     Extraocular Movements: Extraocular movements  intact.     Conjunctiva/sclera: Conjunctivae normal.     Pupils: Pupils are equal, round, and reactive to light.  Cardiovascular:     Rate and Rhythm: Normal rate and regular rhythm.     Heart sounds: No murmur heard.    No friction rub. No gallop.  Pulmonary:     Effort: Pulmonary effort is normal.     Breath sounds: Normal breath sounds.  Abdominal:     General: Abdomen is flat.     Palpations: Abdomen is soft.  Musculoskeletal:        General: Normal range of motion.  Skin:    General: Skin is warm and dry.  Neurological:     General: No focal deficit present.     Mental Status: She is alert and oriented to person, place, and time.  Psychiatric:        Mood and Affect: Mood normal.     Lab Results: Lab Results  Component Value Date   WBC 7.0 07/25/2022   HGB 11.9 07/25/2022   HCT 36.6 07/25/2022   MCV 94 07/25/2022   PLT 222 07/25/2022    Lab Results  Component Value Date   CREATININE 0.72 05/29/2022   BUN 18 05/29/2022   NA 141 05/29/2022   K 3.3 (L) 05/29/2022   CL 99 05/29/2022   CO2 32 05/29/2022    Lab Results  Component Value Date   ALT 10 05/29/2022   AST 15 05/29/2022   ALKPHOS 73 03/21/2022   BILITOT 0.6 05/29/2022     Assessment & Plan:  # Osteomyelitis involving the fifth metatarsal resection site with surrounding small abscesses. # Early osteomyelitis involving the  proximal phalanx of the fifth toe  -Completed Doxy and Augmentin x 7 weeks EOT around 12/7. She had some lateral foot shooting pain at 12/6 visit, which I felt was nerve pain. X ray 12/7 showed similar appearance of fifth metatarsal partial resection compared to 11/17 noted there.  Cortical reaction and irregularity at the resection edge may be due to wound healing although progressive osteomyelitis cannot be excluded.  ESR and CRP stable on 12/6 visit and clinically wound appeared healed as such further imaging held. Seen by podiatry on 3/5 and patient had noted foot was discolored after walking..  X-ray and ESR CRP were obtained.  ESR CRP stable.  X-ray showed remodeling of the resection margins of the metatarsal head on bone biopsy site, patient was restarted on Augmentin x10 days, last dose about 2 weeks ago. -She states once she switched her shoes the erythema resolved. I suspect redness was due to pressure. Wound is healed, F/U PRN  Danelle EarthlyMayanka Makynzi Eastland, MD [facility]   08/29/22  5:34 AM   I have personally spent 32 minutes involved in face-to-face and non-face-to-face activities for this patient on the day of the visit. Professional time spent includes the following activities: Preparing to see the patient (review of tests), Obtaining and/or reviewing separately obtained history (admission/discharge record), Performing a medically appropriate examination and/or evaluation , Ordering medications/tests/procedures, referring and communicating with other health care professionals, Documenting clinical information in the EMR, Independently interpreting results (not separately reported), Communicating results to the patient/family/caregiver, Counseling and educating the patient/family/caregiver and Care coordination (not separately reported).

## 2022-09-07 ENCOUNTER — Other Ambulatory Visit: Payer: Self-pay | Admitting: Internal Medicine

## 2022-09-07 ENCOUNTER — Ambulatory Visit (INDEPENDENT_AMBULATORY_CARE_PROVIDER_SITE_OTHER): Payer: Medicare Other

## 2022-09-07 ENCOUNTER — Encounter: Payer: Self-pay | Admitting: Internal Medicine

## 2022-09-07 ENCOUNTER — Ambulatory Visit (INDEPENDENT_AMBULATORY_CARE_PROVIDER_SITE_OTHER): Payer: Medicare Other | Admitting: Internal Medicine

## 2022-09-07 ENCOUNTER — Other Ambulatory Visit: Payer: Self-pay | Admitting: Orthopaedic Surgery

## 2022-09-07 VITALS — BP 122/80 | HR 65 | Temp 98.3°F | Ht 64.0 in | Wt 133.0 lb

## 2022-09-07 DIAGNOSIS — R079 Chest pain, unspecified: Secondary | ICD-10-CM | POA: Diagnosis not present

## 2022-09-07 DIAGNOSIS — R5383 Other fatigue: Secondary | ICD-10-CM | POA: Diagnosis not present

## 2022-09-07 DIAGNOSIS — I10 Essential (primary) hypertension: Secondary | ICD-10-CM | POA: Diagnosis not present

## 2022-09-07 DIAGNOSIS — R109 Unspecified abdominal pain: Secondary | ICD-10-CM

## 2022-09-07 DIAGNOSIS — R634 Abnormal weight loss: Secondary | ICD-10-CM | POA: Diagnosis not present

## 2022-09-07 DIAGNOSIS — R739 Hyperglycemia, unspecified: Secondary | ICD-10-CM | POA: Diagnosis not present

## 2022-09-07 DIAGNOSIS — E538 Deficiency of other specified B group vitamins: Secondary | ICD-10-CM | POA: Diagnosis not present

## 2022-09-07 DIAGNOSIS — E039 Hypothyroidism, unspecified: Secondary | ICD-10-CM | POA: Diagnosis not present

## 2022-09-07 DIAGNOSIS — E559 Vitamin D deficiency, unspecified: Secondary | ICD-10-CM | POA: Diagnosis not present

## 2022-09-07 DIAGNOSIS — E7800 Pure hypercholesterolemia, unspecified: Secondary | ICD-10-CM | POA: Diagnosis not present

## 2022-09-07 DIAGNOSIS — Z0001 Encounter for general adult medical examination with abnormal findings: Secondary | ICD-10-CM | POA: Diagnosis not present

## 2022-09-07 LAB — BASIC METABOLIC PANEL
BUN: 12 mg/dL (ref 6–23)
CO2: 34 mEq/L — ABNORMAL HIGH (ref 19–32)
Calcium: 9.6 mg/dL (ref 8.4–10.5)
Chloride: 100 mEq/L (ref 96–112)
Creatinine, Ser: 0.78 mg/dL (ref 0.40–1.20)
GFR: 73.2 mL/min (ref >60.00)
Glucose, Bld: 93 mg/dL (ref 70–99)
Potassium: 3.9 mEq/L (ref 3.5–5.1)
Sodium: 141 mEq/L (ref 135–145)

## 2022-09-07 LAB — CBC WITH DIFFERENTIAL/PLATELET
Basophils Absolute: 0 10*3/uL (ref 0.0–0.1)
Basophils Relative: 0.9 % (ref 0.0–3.0)
Eosinophils Absolute: 0.1 10*3/uL (ref 0.0–0.7)
Eosinophils Relative: 0.9 % (ref 0.0–5.0)
HCT: 37.7 % (ref 36.0–46.0)
Hemoglobin: 12.6 g/dL (ref 12.0–15.0)
Lymphocytes Relative: 25.8 % (ref 12.0–46.0)
Lymphs Abs: 1.4 10*3/uL (ref 0.7–4.0)
MCHC: 33.4 g/dL (ref 30.0–36.0)
MCV: 93.8 fl (ref 78.0–100.0)
Monocytes Absolute: 0.4 10*3/uL (ref 0.1–1.0)
Monocytes Relative: 8.2 % (ref 3.0–12.0)
Neutro Abs: 3.4 10*3/uL (ref 1.4–7.7)
Neutrophils Relative %: 64.2 % (ref 43.0–77.0)
Platelets: 250 10*3/uL (ref 150.0–400.0)
RBC: 4.02 Mil/uL (ref 3.87–5.11)
RDW: 14.3 % (ref 11.5–15.5)
WBC: 5.3 10*3/uL (ref 4.0–10.5)

## 2022-09-07 LAB — HEPATIC FUNCTION PANEL
ALT: 12 U/L (ref 0–35)
AST: 20 U/L (ref 0–37)
Albumin: 4.5 g/dL (ref 3.5–5.2)
Alkaline Phosphatase: 74 U/L (ref 39–117)
Bilirubin, Direct: 0.1 mg/dL (ref 0.0–0.3)
Total Bilirubin: 0.8 mg/dL (ref 0.2–1.2)
Total Protein: 7.3 g/dL (ref 6.0–8.3)

## 2022-09-07 LAB — URINALYSIS, ROUTINE W REFLEX MICROSCOPIC
Bilirubin Urine: NEGATIVE
Hgb urine dipstick: NEGATIVE
Ketones, ur: NEGATIVE
Nitrite: NEGATIVE
RBC / HPF: NONE SEEN (ref 0–?)
Specific Gravity, Urine: 1.02 (ref 1.000–1.030)
Total Protein, Urine: NEGATIVE
Urine Glucose: NEGATIVE
Urobilinogen, UA: 0.2 (ref 0.0–1.0)
pH: 6.5 (ref 5.0–8.0)

## 2022-09-07 LAB — VITAMIN D 25 HYDROXY (VIT D DEFICIENCY, FRACTURES): VITD: 42.69 ng/mL (ref 30.00–100.00)

## 2022-09-07 LAB — LIPID PANEL
Cholesterol: 139 mg/dL (ref 0–200)
HDL: 38.6 mg/dL — ABNORMAL LOW (ref >39.00)
LDL Cholesterol: 61 mg/dL (ref 0–99)
NonHDL: 100.15
Total CHOL/HDL Ratio: 4
Triglycerides: 195 mg/dL — ABNORMAL HIGH (ref 0.0–149.0)
VLDL: 39 mg/dL (ref 0.0–40.0)

## 2022-09-07 LAB — HEMOGLOBIN A1C: Hgb A1c MFr Bld: 5.8 % (ref 4.6–6.5)

## 2022-09-07 LAB — TSH: TSH: 4.78 u[IU]/mL (ref 0.35–5.50)

## 2022-09-07 LAB — VITAMIN B12: Vitamin B-12: 1387 pg/mL — ABNORMAL HIGH (ref 211–911)

## 2022-09-07 MED ORDER — CIPROFLOXACIN HCL 500 MG PO TABS: 500.0000 mg | ORAL_TABLET | Freq: Two times a day (BID) | ORAL | 0 refills | Status: AC

## 2022-09-07 NOTE — Patient Instructions (Signed)
Please continue all other medications as before, and refills have been done if requested.  Please have the pharmacy call with any other refills you may need.  Please continue your efforts at being more active, low cholesterol diet, and weight control.  You are otherwise up to date with prevention measures today.  Please keep your appointments with your specialists as you may have planned  You will be contacted regarding the referral for: abdomen ultrasound  Please go to the XRAY Department in the first floor for the x-ray testing  Please go to the LAB at the blood drawing area for the tests to be done  You will be contacted by phone if any changes need to be made immediately.  Otherwise, you will receive a letter about your results with an explanation, but please check with MyChart first.  Please remember to sign up for MyChart if you have not done so, as this will be important to you in the future with finding out test results, communicating by private email, and scheduling acute appointments online when needed.  Please make an Appointment to return in 6 months, or sooner if needed

## 2022-09-07 NOTE — Progress Notes (Signed)
Patient ID: Lauren [name], female   DOB: 06-13-1945, 77 y.o.   MRN: [id number]         Chief Complaint:: wellness exam and Medical Management of Chronic Issues (6 month follow up )  , right flank pain, fatigue, wt loss, hyperglycemia, hld, low vit d, htn       HPI:  Lauren [name] is a 77 y.o. female here for wellness exam; declines covid booster, o/w up to date                        Also has 1 wk onset fatigue, lower appetite, wt loss, and right flank pain worsening, mod intermittent, mild pleuritic but also sore to touch and standing up. Denies urinary symptoms such as dysuria, frequency, urgency, hematuria or n/v, fever, chills.   Pt denies polydipsia, polyuria, or new focal neuro s/s.    Pt denies fever, night sweats, loss of appetite, or other constitutional symptoms, but has lost several lbs in the past wk.  Ibuprofen and tylenol not helping   Wt Readings from Last 3 Encounters:  09/07/22 133 lb (60.3 kg)  08/29/22 139 lb (63 kg)  05/29/22 134 lb 9.6 oz (61.1 kg)   BP Readings from Last 3 Encounters:  09/07/22 122/80  08/29/22 110/69  07/27/22 (!) 151/77   Immunization History  Administered Date(s) Administered   Fluad Quad(high Dose 65+) 02/07/2019   Influenza, High Dose Seasonal PF 03/27/2018, 02/26/2020   Influenza-Unspecified 02/20/2015, 02/26/2016, 03/22/2018, 03/14/2021   PFIZER Comirnaty(Gray Top)Covid-19 Tri-Sucrose Vaccine 08/23/2020   PFIZER(Purple Top)SARS-COV-2 Vaccination 06/28/2019, 07/22/2019, 02/16/2020, 02/28/2021   Pfizer Covid-19 Vaccine Bivalent Booster 50yrs & up 03/17/2021   Pneumococcal Conjugate-13 10/08/2013   Pneumococcal Polysaccharide-23 10/14/2014   Tdap 05/22/2016   Zoster Recombinat (Shingrix) 07/04/2017, 10/31/2017   Zoster, Live 10/15/2014   Health Maintenance Due  Topic Date Due   Medicare Annual Wellness (AWV)  05/25/2022      Past Medical History:  Diagnosis Date   Anxiety    Arthritis    Carotid stenosis 10/08/2013   Cervical  disc disease    Depression    Diverticulitis    Diverticulosis    Frequent headaches    Hypertension    does not now per pt.   Hypothyroidism 10/26/2015   Impaired glucose tolerance 10/08/2013   normal HgA1C   Neck pain    Psoriasis    Past Surgical History:  Procedure Laterality Date   BREAST BIOPSY Left 2011   benign    CERVICAL FUSION     approx 1980   CHOLECYSTECTOMY     COLONOSCOPY     I & D EXTREMITY Right 03/07/2022   Procedure: IRRIGATION AND DEBRIDEMENT OF FOOT WITH BONE BIOPSY;  Surgeon: Vivi Barrack, DPM;  Location: [facility] OR;  Service: Podiatry;  Laterality: Right;   POSTERIOR CERVICAL FUSION/FORAMINOTOMY N/A 11/05/2014   Procedure: Cervical four to Cervical seven  Posterior Cervical Fusion with lateral mass fixation;  Surgeon: Tia Alert, MD;  Location: [facility];  Service: Neurosurgery;  Laterality: N/A;   C4 - C7 Posterior Cervical Fusion with lateral mass fixation   TOTAL ABDOMINAL HYSTERECTOMY  1998   Fibroid   UPPER GASTROINTESTINAL ENDOSCOPY      reports that she has never smoked. She has never been exposed to tobacco smoke. She has never used smokeless tobacco. She reports that she does not currently use alcohol. She reports that she does not use drugs. family history includes  Alcohol abuse in an other family member; Alcoholism in her father; Arthritis in an other family member; Cancer in an other family member; Cervical cancer in her mother; Colon cancer in her father; Diabetes in her brother and mother; Heart attack in her mother; Heart disease in her mother and another family member; Hypertension in an other family member; Lung cancer in her father; Throat cancer in her brother. Allergies  Allergen Reactions   Celebrex [Celecoxib] Itching and Rash   Current Outpatient Medications on File Prior to Visit  Medication Sig Dispense Refill   acetaminophen-codeine (TYLENOL #3) 300-30 MG tablet Take 1-2 tablets by mouth every 6 (six) hours as needed for  moderate pain. 15 tablet 0   ALPRAZolam (XANAX) 1 MG tablet TAKE 1/2 TO 1 TABLET BY MOUTH TWICE DAILY AS NEEDED 60 tablet 2   amLODipine (NORVASC) 5 MG tablet TAKE 1 TABLET BY MOUTH DAILY (Patient taking differently: Take 5 mg by mouth daily.) 100 tablet 2   amoxicillin-clavulanate (AUGMENTIN) 875-125 MG tablet Take 1 tablet by mouth 2 (two) times daily. 20 tablet 0   aspirin EC 325 MG tablet Take 325 mg by mouth daily.     Cholecalciferol (D3 ADULT PO) Take 1 tablet by mouth daily.     citalopram (CELEXA) 10 MG tablet Take 1 tablet (10 mg total) by mouth daily. 90 tablet 3   clotrimazole-betamethasone (LOTRISONE) cream Apply 1 Application topically daily. 30 g 1   cyanocobalamin 100 MCG tablet Take 100 mcg by mouth daily.     famotidine (PEPCID) 40 MG tablet Take 40 mg by mouth daily.     hydrochlorothiazide (MICROZIDE) 12.5 MG capsule TAKE 1 CAPSULE BY MOUTH DAILY 70 capsule 4   ketoconazole (NIZORAL) 2 % cream Apply 1 Application topically daily. 30 g 1   levothyroxine (SYNTHROID) 50 MCG tablet TAKE 1 TABLET BY MOUTH DAILY (Patient taking differently: Take 50 mcg by mouth daily.) 100 tablet 2   mupirocin ointment (BACTROBAN) 2 % SMARTSIG:1 sparingly Topical Daily     nystatin cream (MYCOSTATIN) Apply 1 Application topically 2 (two) times daily. 30 g 1   nystatin-triamcinolone ointment (MYCOLOG) SMARTSIG:sparingly Topical Twice Daily     pravastatin (PRAVACHOL) 20 MG tablet Take 1 tablet (20 mg total) by mouth daily. 90 tablet 3   RESTASIS 0.05 % ophthalmic emulsion Place 1 drop into both eyes 2 (two) times daily.     zolpidem (AMBIEN) 5 MG tablet TAKE 1 TABLET BY MOUTH AT  BEDTIME AS NEEDED FOR SLEEP 90 tablet 1   amoxicillin-clavulanate (AUGMENTIN) 875-125 MG tablet Take 1 tablet by mouth 2 (two) times daily. (Patient not taking: Reported on 08/29/2022) 20 tablet 0   oxyCODONE-acetaminophen (PERCOCET/ROXICET) 5-325 MG tablet Take 1 tablet by mouth every 6 (six) hours as needed for severe pain.  (Patient not taking: Reported on 07/27/2022) 10 tablet 0   No current facility-administered medications on file prior to visit.        ROS:  All others reviewed and negative.  Objective        PE:  BP 122/80 (BP Location: Right Arm, Patient Position: Sitting, Cuff Size: Normal)   Pulse 65   Temp 98.3 F (36.8 C) (Oral)   Ht  (1.626 m)   Wt 133 lb (60.3 kg)   LMP 05/22/1996   SpO2 96%   BMI 22.83 kg/m                 Constitutional: Pt appears in NAD  HENT: Head: NCAT.                Right Ear: External ear normal.                 Left Ear: External ear normal.                Eyes: . Pupils are equal, round, and reactive to light. Conjunctivae and EOM are normal               Nose: without d/c or deformity               Neck: Neck supple. Gross normal ROM               Cardiovascular: Normal rate and regular rhythm.                 Pulmonary/Chest: Effort normal and breath sounds without rales or wheezing.                Abd:  Soft, NT, ND, + BS, no organomegaly               Neurological: Pt is alert. At baseline orientation, motor grossly intact               Skin: Skin is warm. No rashes, no other new lesions, LE edema - none               Psychiatric: Pt behavior is normal without agitation   Micro: none  Cardiac tracings I have personally interpreted today:  none  Pertinent Radiological findings (summarize): none   Lab Results  Component Value Date   WBC 5.3 09/07/2022   HGB 12.6 09/07/2022   HCT 37.7 09/07/2022   PLT 250.0 09/07/2022   GLUCOSE 93 09/07/2022   CHOL 139 09/07/2022   TRIG 195.0 (H) 09/07/2022   HDL 38.60 (L) 09/07/2022   LDLDIRECT 75.0 04/26/2021   LDLCALC 61 09/07/2022   ALT 12 09/07/2022   AST 20 09/07/2022   NA 141 09/07/2022   K 3.9 09/07/2022   CL 100 09/07/2022   CREATININE 0.78 09/07/2022   BUN 12 09/07/2022   CO2 34 (H) 09/07/2022   TSH 4.78 09/07/2022   INR 0.9 07/16/2021   HGBA1C 5.8 09/07/2022    Assessment/Plan:  Lauren [name] is a 77 y.o. White or Caucasian [1] female with  has a past medical history of Anxiety, Arthritis, Carotid stenosis (10/08/2013), Cervical disc disease, Depression, Diverticulitis, Diverticulosis, Frequent headaches, Hypertension, Hypothyroidism (10/26/2015), Impaired glucose tolerance (10/08/2013), Neck pain, and Psoriasis.  Encounter for well adult exam with abnormal findings Age and sex appropriate education and counseling updated with regular exercise and diet Referrals for preventative services - none needed Immunizations addressed - declines covid booster Smoking counseling  - none needed Evidence for depression or other mood disorder - none significant Most recent labs reviewed. I have personally reviewed and have noted: 1) the patient's medical and social history 2) The patient's current medications and supplements 3) The patient's height, weight, and BMI have been recorded in the chart   Essential hypertension BP Readings from Last 3 Encounters:  09/07/22 122/80  08/29/22 110/69  07/27/22 (!) 151/77   Stable, pt to continue medical treatment - norvasc 5 mg qd, hct 12.5 mg qd  Fatigue Likely related to underlying complaint etiology unclear, also for cbc with labs.   Hyperglycemia Lab Results  Component Value Date   HGBA1C 5.8 09/07/2022   Stable, pt to  continue current medical treatment  - diet, wt control  Hypothyroidism Lab Results  Component Value Date   TSH 4.78 09/07/2022   Stable, pt to continue levothyroxine 50 mcg qd   Weight loss Likely related to underlying complaint it seems, etiology unclear,  to f/u any worsening symptoms or concerns  Vitamin D deficiency Last vitamin D Lab Results  Component Value Date   VD25OH 42.69 09/07/2022   Stable, cont oral replacement   HLD (hyperlipidemia) Lab Results  Component Value Date   LDLCALC 61 09/07/2022   Stable, pt to continue current statin pravaschol 20 mg  qd   Right flank pain With tender, afeb, no urinary complaints, for UA with labs, for abd u/s r/o GB or other, consider CT if labs not revealing.  Followup: Return in about 6 months (around 03/09/2023).  Oliver Barre, MD 09/09/2022 7:45 PM [facility]

## 2022-09-09 ENCOUNTER — Encounter: Payer: Self-pay | Admitting: Internal Medicine

## 2022-09-09 NOTE — Assessment & Plan Note (Signed)
BP Readings from Last 3 Encounters:  09/07/22 122/80  08/29/22 110/69  07/27/22 (!) 151/77   Stable, pt to continue medical treatment - norvasc 5 mg qd, hct 12.5 mg qd

## 2022-09-09 NOTE — Assessment & Plan Note (Signed)

## 2022-09-09 NOTE — Assessment & Plan Note (Signed)
Likely related to underlying complaint it seems, etiology unclear,  to f/u any worsening symptoms or concerns

## 2022-09-09 NOTE — Assessment & Plan Note (Signed)
Lab Results  Component Value Date   LDLCALC 61 09/07/2022   Stable, pt to continue current statin pravaschol 20 mg qd

## 2022-09-09 NOTE — Assessment & Plan Note (Addendum)
With tender, afeb, no urinary complaints, for UA with labs, for abd u/s r/o GB or other, consider CT if labs not revealing.

## 2022-09-09 NOTE — Assessment & Plan Note (Signed)
Lab Results  Component Value Date   HGBA1C 5.8 09/07/2022   Stable, pt to continue current medical treatment  - diet, wt control

## 2022-09-09 NOTE — Assessment & Plan Note (Signed)
Lab Results  Component Value Date   TSH 4.78 09/07/2022   Stable, pt to continue levothyroxine 50 mcg qd

## 2022-09-09 NOTE — Assessment & Plan Note (Signed)
Last vitamin D Lab Results  Component Value Date   VD25OH 42.69 09/07/2022   Stable, cont oral replacement

## 2022-09-09 NOTE — Assessment & Plan Note (Signed)
Likely related to underlying complaint etiology unclear, also for cbc with labs.

## 2022-09-11 ENCOUNTER — Encounter: Payer: Self-pay | Admitting: Internal Medicine

## 2022-09-13 ENCOUNTER — Encounter: Payer: Self-pay | Admitting: Internal Medicine

## 2022-09-19 ENCOUNTER — Encounter: Payer: Self-pay | Admitting: Internal Medicine

## 2022-09-19 DIAGNOSIS — R109 Unspecified abdominal pain: Secondary | ICD-10-CM

## 2022-09-19 DIAGNOSIS — R300 Dysuria: Secondary | ICD-10-CM

## 2022-09-20 ENCOUNTER — Other Ambulatory Visit (INDEPENDENT_AMBULATORY_CARE_PROVIDER_SITE_OTHER): Payer: Medicare Other

## 2022-09-20 DIAGNOSIS — R300 Dysuria: Secondary | ICD-10-CM

## 2022-09-20 DIAGNOSIS — R109 Unspecified abdominal pain: Secondary | ICD-10-CM

## 2022-09-20 LAB — URINALYSIS, ROUTINE W REFLEX MICROSCOPIC
Bilirubin Urine: NEGATIVE
Hgb urine dipstick: NEGATIVE
Ketones, ur: NEGATIVE
Leukocytes,Ua: NEGATIVE
Nitrite: NEGATIVE
RBC / HPF: NONE SEEN (ref 0–?)
Specific Gravity, Urine: 1.02 (ref 1.000–1.030)
Total Protein, Urine: NEGATIVE
Urine Glucose: NEGATIVE
Urobilinogen, UA: 0.2 (ref 0.0–1.0)
pH: 6 (ref 5.0–8.0)

## 2022-09-21 LAB — URINE CULTURE: Result:: NO GROWTH

## 2022-09-21 NOTE — Progress Notes (Signed)
The test results show that your current treatment is OK, as the urine culture  is negative  Please continue the same plan.  There is no other need for change of treatment or further evaluation based on these results, at this time.  thanks

## 2022-09-22 ENCOUNTER — Ambulatory Visit
Admission: RE | Admit: 2022-09-22 | Discharge: 2022-09-22 | Disposition: A | Discharge status: Home / Self Care | Payer: Medicare Other | Source: Ambulatory Visit | Attending: Internal Medicine | Admitting: Internal Medicine

## 2022-09-22 ENCOUNTER — Encounter: Payer: Self-pay | Admitting: Internal Medicine

## 2022-09-22 DIAGNOSIS — R109 Unspecified abdominal pain: Secondary | ICD-10-CM

## 2022-09-25 ENCOUNTER — Encounter: Payer: Self-pay | Admitting: Internal Medicine

## 2022-09-25 DIAGNOSIS — R109 Unspecified abdominal pain: Secondary | ICD-10-CM

## 2022-09-25 MED ORDER — ACETAMINOPHEN-CODEINE 300-30 MG PO TABS: 1.0000 | ORAL_TABLET | Freq: Four times a day (QID) | ORAL | 1 refills | Status: DC | PRN

## 2022-09-26 ENCOUNTER — Ambulatory Visit (INDEPENDENT_AMBULATORY_CARE_PROVIDER_SITE_OTHER): Payer: Medicare Other

## 2022-09-26 DIAGNOSIS — M5136 Other intervertebral disc degeneration, lumbar region: Secondary | ICD-10-CM | POA: Diagnosis not present

## 2022-09-26 DIAGNOSIS — R109 Unspecified abdominal pain: Secondary | ICD-10-CM

## 2022-09-26 DIAGNOSIS — M545 Low back pain, unspecified: Secondary | ICD-10-CM | POA: Diagnosis not present

## 2022-09-26 DIAGNOSIS — M47814 Spondylosis without myelopathy or radiculopathy, thoracic region: Secondary | ICD-10-CM | POA: Diagnosis not present

## 2022-09-29 ENCOUNTER — Encounter: Payer: Self-pay | Admitting: Internal Medicine

## 2022-10-09 ENCOUNTER — Other Ambulatory Visit: Payer: Self-pay | Admitting: Internal Medicine

## 2022-11-01 ENCOUNTER — Ambulatory Visit: Payer: Medicare Other | Admitting: Internal Medicine

## 2022-12-28 ENCOUNTER — Other Ambulatory Visit: Payer: Self-pay | Admitting: Internal Medicine

## 2022-12-28 ENCOUNTER — Encounter: Payer: Self-pay | Admitting: Internal Medicine

## 2023-01-04 ENCOUNTER — Ambulatory Visit (INDEPENDENT_AMBULATORY_CARE_PROVIDER_SITE_OTHER): Payer: Medicare Other

## 2023-01-04 VITALS — Ht 64.0 in | Wt 134.0 lb

## 2023-01-04 DIAGNOSIS — Z0000 Encounter for general adult medical examination without abnormal findings: Secondary | ICD-10-CM | POA: Diagnosis not present

## 2023-01-04 NOTE — Patient Instructions (Addendum)
[name] , Thank you for taking time to come for your Medicare Wellness Visit. I appreciate your ongoing commitment to your health goals. Please review the following plan we discussed and let me know if I can assist you in the future.   Referrals/Orders/Follow-Ups/Clinician Recommendations: No  This is a list of the screening recommended for you and due dates:  Health Maintenance  Topic Date Due   COVID-19 Vaccine (7 - 2023-24 season) 01/20/2022   Flu Shot  12/21/2022   Mammogram  07/20/2023   Medicare Annual Wellness Visit  01/04/2024   DTaP/Tdap/Td vaccine (2 - Td or Tdap) 05/22/2026   Pneumonia Vaccine  Completed   DEXA scan (bone density measurement)  Completed   Hepatitis C Screening  Completed   Zoster (Shingles) Vaccine  Completed   HPV Vaccine  Aged Out   Colon Cancer Screening  Discontinued    Advanced directives: (Copy Requested) Please bring a copy of your health care power of attorney and living will to the office to be added to your chart at your convenience.  Next Medicare Annual Wellness Visit scheduled for next year: Yes  Preventive Care 78 Years and Older, Female Preventive care refers to lifestyle choices and visits with your health care provider that can promote health and wellness. What does preventive care include? A yearly physical exam. This is also called an annual well check. Dental exams once or twice a year. Routine eye exams. Ask your health care provider how often you should have your eyes checked. Personal lifestyle choices, including: Daily care of your teeth and gums. Regular physical activity. Eating a healthy diet. Avoiding tobacco and drug use. Limiting alcohol use. Practicing safe sex. Taking low-dose aspirin every day. Taking vitamin and mineral supplements as recommended by your health care provider. What happens during an annual well check? The services and screenings done by your health care provider during your annual well check  will depend on your age, overall health, lifestyle risk factors, and family history of disease. Counseling  Your health care provider may ask you questions about your: Alcohol use. Tobacco use. Drug use. Emotional well-being. Home and relationship well-being. Sexual activity. Eating habits. History of falls. Memory and ability to understand (cognition). Work and work Astronomer. Reproductive health. Screening  You may have the following tests or measurements: Height, weight, and BMI. Blood pressure. Lipid and cholesterol levels. These may be checked every 5 years, or more frequently if you are over 62 years old. Skin check. Lung cancer screening. You may have this screening every year starting at age 78 if you have a 30-pack-year history of smoking and currently smoke or have quit within the past 15 years. Fecal occult blood test (FOBT) of the stool. You may have this test every year starting at age 78. Flexible sigmoidoscopy or colonoscopy. You may have a sigmoidoscopy every 5 years or a colonoscopy every 10 years starting at age 78. Hepatitis C blood test. Hepatitis B blood test. Sexually transmitted disease (STD) testing. Diabetes screening. This is done by checking your blood sugar (glucose) after you have not eaten for a while (fasting). You may have this done every 1-3 years. Bone density scan. This is done to screen for osteoporosis. You may have this done starting at age 78. Mammogram. This may be done every 1-2 years. Talk to your health care provider about how often you should have regular mammograms. Talk with your health care provider about your test results, treatment options, and if necessary, the need  for more tests. Vaccines  Your health care provider may recommend certain vaccines, such as: Influenza vaccine. This is recommended every year. Tetanus, diphtheria, and acellular pertussis (Tdap, Td) vaccine. You may need a Td booster every 10 years. Zoster vaccine. You  may need this after age 78. Pneumococcal 13-valent conjugate (PCV13) vaccine. One dose is recommended after age 78. Pneumococcal polysaccharide (PPSV23) vaccine. One dose is recommended after age 78. Talk to your health care provider about which screenings and vaccines you need and how often you need them. This information is not intended to replace advice given to you by your health care provider. Make sure you discuss any questions you have with your health care provider. Document Released: 06/04/2015 Document Revised: 01/26/2016 Document Reviewed: 03/09/2015 Elsevier Interactive Patient Education  2017 [employer].  Fall Prevention in the Home Falls can cause injuries. They can happen to people of all ages. There are many things you can do to make your home safe and to help prevent falls. What can I do on the outside of my home? Regularly fix the edges of walkways and driveways and fix any cracks. Remove anything that might make you trip as you walk through a door, such as a raised step or threshold. Trim any bushes or trees on the path to your home. Use bright outdoor lighting. Clear any walking paths of anything that might make someone trip, such as rocks or tools. Regularly check to see if handrails are loose or broken. Make sure that both sides of any steps have handrails. Any raised decks and porches should have guardrails on the edges. Have any leaves, snow, or ice cleared regularly. Use sand or salt on walking paths during winter. Clean up any spills in your garage right away. This includes oil or grease spills. What can I do in the bathroom? Use night lights. Install grab bars by the toilet and in the tub and shower. Do not use towel bars as grab bars. Use non-skid mats or decals in the tub or shower. If you need to sit down in the shower, use a plastic, non-slip stool. Keep the floor dry. Clean up any water that spills on the floor as soon as it happens. Remove soap buildup  in the tub or shower regularly. Attach bath mats securely with double-sided non-slip rug tape. Do not have throw rugs and other things on the floor that can make you trip. What can I do in the bedroom? Use night lights. Make sure that you have a light by your bed that is easy to reach. Do not use any sheets or blankets that are too big for your bed. They should not hang down onto the floor. Have a firm chair that has side arms. You can use this for support while you get dressed. Do not have throw rugs and other things on the floor that can make you trip. What can I do in the kitchen? Clean up any spills right away. Avoid walking on wet floors. Keep items that you use a lot in easy-to-reach places. If you need to reach something above you, use a strong step stool that has a grab bar. Keep electrical cords out of the way. Do not use floor polish or wax that makes floors slippery. If you must use wax, use non-skid floor wax. Do not have throw rugs and other things on the floor that can make you trip. What can I do with my stairs? Do not leave any items on the stairs.  Make sure that there are handrails on both sides of the stairs and use them. Fix handrails that are broken or loose. Make sure that handrails are as long as the stairways. Check any carpeting to make sure that it is firmly attached to the stairs. Fix any carpet that is loose or worn. Avoid having throw rugs at the top or bottom of the stairs. If you do have throw rugs, attach them to the floor with carpet tape. Make sure that you have a light switch at the top of the stairs and the bottom of the stairs. If you do not have them, ask someone to add them for you. What else can I do to help prevent falls? Wear shoes that: Do not have high heels. Have rubber bottoms. Are comfortable and fit you well. Are closed at the toe. Do not wear sandals. If you use a stepladder: Make sure that it is fully opened. Do not climb a closed  stepladder. Make sure that both sides of the stepladder are locked into place. Ask someone to hold it for you, if possible. Clearly mark and make sure that you can see: Any grab bars or handrails. First and last steps. Where the edge of each step is. Use tools that help you move around (mobility aids) if they are needed. These include: Canes. Walkers. Scooters. Crutches. Turn on the lights when you go into a dark area. Replace any light bulbs as soon as they burn out. Set up your furniture so you have a clear path. Avoid moving your furniture around. If any of your floors are uneven, fix them. If there are any pets around you, be aware of where they are. Review your medicines with your doctor. Some medicines can make you feel dizzy. This can increase your chance of falling. Ask your doctor what other things that you can do to help prevent falls. This information is not intended to replace advice given to you by your health care provider. Make sure you discuss any questions you have with your health care provider. Document Released: 03/04/2009 Document Revised: 10/14/2015 Document Reviewed: 06/12/2014 Elsevier Interactive Patient Education  2017 [employer].

## 2023-01-04 NOTE — Progress Notes (Signed)
Subjective:   Lauren [name] is a 77 y.o. female who presents for Medicare Annual (Subsequent) preventive examination.  Visit Complete: Virtual  I connected with  Lauren [name] on 01/04/23 by a audio enabled telemedicine application and verified that I am speaking with the correct person using two identifiers.  Patient Location: Home  Provider Location: Office/Clinic  I discussed the limitations of evaluation and management by telemedicine. The patient expressed understanding and agreed to proceed.  Vital Signs: Unable to obtain new vitals due to this being a telehealth visit. Patient provided weight for this visit.  Review of Systems     Cardiac Risk Factors include: advanced age (>89men, >38 women);hypertension;dyslipidemia;family history of premature cardiovascular disease     Objective:    Today's Vitals   01/04/23 0932  Weight: 134 lb (60.8 kg)  Height: 5\' 4"  (1.626 m)  PainSc: 0-No pain   Body mass index is 23 kg/m.     01/04/2023    9:34 AM 03/06/2022    7:43 PM 10/22/2021    1:02 PM 05/25/2021    9:39 AM 04/09/2020    2:40 PM 03/22/2019    5:30 PM 03/10/2019   12:21 PM  Advanced Directives  Does Patient Have a Medical Advance Directive? Yes No No;Yes Yes Yes Yes No  Type of Advance Directive Living will;Healthcare Power of Attorney  Out of facility DNR (pink MOST or yellow form)  Living will;Out of facility DNR (pink MOST or yellow form) Out of facility DNR (pink MOST or yellow form)   Does patient want to make changes to medical advance directive?   No - Patient declined No - Patient declined No - Patient declined    Copy of Healthcare Power of Attorney in Chart? No - copy requested        Would patient like information on creating a medical advance directive?  No - Patient declined No - Patient declined   No - Patient declined No - Patient declined    Current Medications (verified) Outpatient Encounter Medications as of 01/04/2023  Medication Sig    acetaminophen-codeine (TYLENOL/CODEINE #3) 300-30 MG tablet Take 1 tablet by mouth every 6 (six) hours as needed.   ALPRAZolam (XANAX) 1 MG tablet TAKE 1/2 TO 1 TABLET BY MOUTH TWICE DAILY AS NEEDED   amLODipine (NORVASC) 5 MG tablet TAKE 1 TABLET BY MOUTH DAILY   aspirin EC 325 MG tablet Take 325 mg by mouth daily.   Cholecalciferol (D3 ADULT PO) Take 1 tablet by mouth daily.   citalopram (CELEXA) 10 MG tablet Take 1 tablet (10 mg total) by mouth daily.   famotidine (PEPCID) 40 MG tablet Take 40 mg by mouth daily.   hydrochlorothiazide (MICROZIDE) 12.5 MG capsule TAKE 1 CAPSULE BY MOUTH DAILY   ibuprofen (ADVIL) 800 MG tablet TAKE 1 TABLET(800 MG) BY MOUTH TWICE DAILY AS NEEDED FOR MODERATE PAIN   levothyroxine (SYNTHROID) 50 MCG tablet TAKE 1 TABLET BY MOUTH DAILY   pravastatin (PRAVACHOL) 20 MG tablet Take 1 tablet (20 mg total) by mouth daily.   RESTASIS 0.05 % ophthalmic emulsion Place 1 drop into both eyes 2 (two) times daily.   zolpidem (AMBIEN) 5 MG tablet TAKE 1 TABLET BY MOUTH AT  BEDTIME AS NEEDED FOR SLEEP   No facility-administered encounter medications on file as of 01/04/2023.    Allergies (verified) Celebrex [celecoxib]   History: Past Medical History:  Diagnosis Date   Anxiety    Arthritis    Carotid stenosis 10/08/2013  Cervical disc disease    Depression    Diverticulitis    Diverticulosis    Frequent headaches    Hypertension    does not now per pt.   Hypothyroidism 10/26/2015   Impaired glucose tolerance 10/08/2013   normal HgA1C   Neck pain    Psoriasis    Past Surgical History:  Procedure Laterality Date   BREAST BIOPSY Left 2011   benign    CERVICAL FUSION     approx 1980   CHOLECYSTECTOMY     COLONOSCOPY     I & D EXTREMITY Right 03/07/2022   Procedure: IRRIGATION AND DEBRIDEMENT OF FOOT WITH BONE BIOPSY;  Surgeon: Vivi Barrack, DPM;  Location: [facility];  Service: Podiatry;  Laterality: Right;   POSTERIOR CERVICAL FUSION/FORAMINOTOMY N/A  11/05/2014   Procedure: Cervical four to Cervical seven  Posterior Cervical Fusion with lateral mass fixation;  Surgeon: Tia Alert, MD;  Location: [facility];  Service: Neurosurgery;  Laterality: N/A;   C4 - C7 Posterior Cervical Fusion with lateral mass fixation   TOTAL ABDOMINAL HYSTERECTOMY  1998   Fibroid   UPPER GASTROINTESTINAL ENDOSCOPY     Family History  Problem Relation Age of Onset   Heart disease Mother    Diabetes Mother    Cervical cancer Mother    Heart attack Mother    Lung cancer Father    Alcoholism Father    Colon cancer Father    Diabetes Brother    Throat cancer Brother    Cancer Other        lung cancer   Heart disease Other    Hypertension Other    Arthritis Other    Alcohol abuse Other    Rectal cancer Neg Hx    Stomach cancer Neg Hx    Social History   Socioeconomic History   Marital status: Married    Spouse name: Not on file   Number of children: 2   Years of education: 12   Highest education level: Not on file  Occupational History   Occupation: retired  Tobacco Use   Smoking status: Never    Passive exposure: Never   Smokeless tobacco: Never  Vaping Use   Vaping status: Never Used  Substance and Sexual Activity   Alcohol use: Not Currently   Drug use: No   Sexual activity: Yes    Partners: Male    Birth control/protection: Surgical    Comment: Hyst  Other Topics Concern   Not on file  Social History Narrative   Lives at home with husband and son.   Right-handed.   2 cups caffeine daily.   Social Determinants of Health   Financial Resource Strain: Low Risk  (01/04/2023)   Overall Financial Resource Strain (CARDIA)    Difficulty of Paying Living Expenses: Not hard at all  Food Insecurity: No Food Insecurity (01/04/2023)   Hunger Vital Sign    Worried About Running Out of Food in the Last Year: Never true    Ran Out of Food in the Last Year: Never true  Transportation Needs: No Transportation Needs (01/04/2023)   PRAPARE -  Administrator, Civil Service (Medical): No    Lack of Transportation (Non-Medical): No  Physical Activity: Inactive (01/04/2023)   Exercise Vital Sign    Days of Exercise per Week: 0 days    Minutes of Exercise per Session: 0 min  Stress: No Stress Concern Present (01/04/2023)   [employer] of Occupational Health -  Occupational Stress Questionnaire    Feeling of Stress : Not at all  Social Connections: Moderately Isolated (01/04/2023)   Social Connection and Isolation Panel [NHANES]    Frequency of Communication with Friends and Family: More than three times a week    Frequency of Social Gatherings with Friends and Family: Never    Attends Religious Services: Never    Database administrator or Organizations: No    Attends Engineer, structural: Never    Marital Status: Married    Tobacco Counseling Counseling given: Not Answered   Clinical Intake:  Pre-visit preparation completed: Yes  Pain : No/denies pain Pain Score: 0-No pain     BMI - recorded: 23 Nutritional Status: BMI of 19-24  Normal Nutritional Risks: None Diabetes: No  How often do you need to have someone help you when you read instructions, pamphlets, or other written materials from your doctor or pharmacy?: 1 - Never What is the last grade level you completed in school?: HSG  Interpreter Needed?: No  Information entered by ::  N. , LPN.   Activities of Daily Living    01/04/2023    9:37 AM 03/08/2022   12:00 AM  In your present state of health, do you have any difficulty performing the following activities:  Hearing? 0 0  Vision? 0 0  Difficulty concentrating or making decisions? 0 0  Walking or climbing stairs? 0 0  Dressing or bathing? 0 0  Doing errands, shopping? 0 0  Preparing Food and eating ? N   Using the Toilet? N   In the past six months, have you accidently leaked urine? N   Do you have problems with loss of bowel control? N   Managing your  Medications? N   Managing your Finances? N   Housekeeping or managing your Housekeeping? N     Patient Care Team: Corwin Levins, MD as PCP - General (Internal Medicine) Odette Fraction, MD (Inactive) as Consulting Physician (Anesthesiology) [facility], [place] as Consulting Physician (Optometry)  Indicate any recent Medical Services you may have received from other than [facility] providers in the past year (date may be approximate).     Assessment:   This is a routine wellness examination for Jakyah.  Hearing/Vision screen Hearing Screening - Comments:: Denies hearing difficulties. No hearing aids.   Vision Screening - Comments:: Wears rx glasses - up to date with routine eye exams with [facility]   Dietary issues and exercise activities discussed:     Goals Addressed             This Visit's Progress    Patient Stated       Keep being healthy and trusting in God.      Depression Screen    01/04/2023    9:36 AM 09/07/2022    9:00 AM 08/29/2022    8:40 AM 06/13/2022    9:06 AM 05/29/2022    8:30 AM 05/02/2022    1:02 PM 03/23/2022   10:23 AM  PHQ 2/9 Scores  PHQ - 2 Score 0 0 0 0 0 0 0  PHQ- 9 Score 0 4  0  0     Fall Risk    01/04/2023    9:35 AM 09/07/2022    8:59 AM 08/29/2022    8:40 AM 05/29/2022    8:30 AM 05/02/2022    1:02 PM  Fall Risk   Falls in the past year? 1 0 0 0 0  Number falls in past yr: 0 0 0 0   Injury with Fall? 0 0 0 0 0  Risk for fall due to : No Fall Risks History of fall(s)  No Fall Risks No Fall Risks  Follow up Falls evaluation completed Falls evaluation completed  Falls evaluation completed Falls evaluation completed    MEDICARE RISK AT HOME:  Medicare Risk at Home - 01/04/23 0934     Any stairs in or around the home? No    If so, are there any without handrails? No    Home free of loose throw rugs in walkways, pet beds, electrical cords, etc? Yes    Adequate lighting in your home to reduce risk of falls? Yes    Life alert?  No    Use of a cane, walker or w/c? No    Grab bars in the bathroom? Yes    Shower chair or bench in shower? Yes    Elevated toilet seat or a handicapped toilet? Yes             TIMED UP AND GO:  Was the test performed?  No    Cognitive Function:        01/04/2023    9:36 AM  6CIT Screen  What Year? 0 points  What month? 0 points  What time? 0 points  Count back from 20 0 points  Months in reverse 0 points  Repeat phrase 0 points  Total Score 0 points    Immunizations Immunization History  Administered Date(s) Administered   Fluad Quad(high Dose 65+) 02/07/2019   Influenza, High Dose Seasonal PF 03/27/2018, 02/26/2020   Influenza-Unspecified 02/20/2015, 02/26/2016, 03/22/2018, 03/14/2021   PFIZER Comirnaty(Gray Top)Covid-19 Tri-Sucrose Vaccine 08/23/2020   PFIZER(Purple Top)SARS-COV-2 Vaccination 06/28/2019, 07/22/2019, 02/16/2020, 02/28/2021   Pfizer Covid-19 Vaccine Bivalent Booster 12yrs & up 03/17/2021   Pneumococcal Conjugate-13 10/08/2013   Pneumococcal Polysaccharide-23 10/14/2014   Tdap 05/22/2016   Zoster Recombinant(Shingrix) 07/04/2017, 10/31/2017   Zoster, Live 10/15/2014    TDAP status: Up to date  Flu Vaccine status: Due, Education has been provided regarding the importance of this vaccine. Advised may receive this vaccine at local pharmacy or Health Dept. Aware to provide a copy of the vaccination record if obtained from local pharmacy or Health Dept. Verbalized acceptance and understanding.  Pneumococcal vaccine status: Up to date  Covid-19 vaccine status: Completed vaccines  Qualifies for Shingles Vaccine? Yes   Zostavax completed Yes   Shingrix Completed?: Yes  Screening Tests Health Maintenance  Topic Date Due   COVID-19 Vaccine (7 - 2023-24 season) 01/20/2022   INFLUENZA VACCINE  12/21/2022   MAMMOGRAM  07/20/2023   Medicare Annual Wellness (AWV)  01/04/2024   DTaP/Tdap/Td (2 - Td or Tdap) 05/22/2026   Pneumonia Vaccine 65+ Years  old  Completed   DEXA SCAN  Completed   Hepatitis C Screening  Completed   Zoster Vaccines- Shingrix  Completed   HPV VACCINES  Aged Out   Colonoscopy  Discontinued    Health Maintenance  Health Maintenance Due  Topic Date Due   COVID-19 Vaccine (7 - 2023-24 season) 01/20/2022   INFLUENZA VACCINE  12/21/2022    Colorectal cancer screening: No longer required.   Mammogram status: Completed 07/20/2022. Repeat every year  Bone Density status: Completed 04/23/2017. Results reflect: Bone density results: OSTEOPENIA. Repeat every 2-3 years.  Lung Cancer Screening: (Low Dose CT Chest recommended if Age 19-80 years, 20 pack-year currently smoking OR have quit w/in 15years.) does not qualify.  Lung Cancer Screening Referral: no  Additional Screening:  Hepatitis C Screening: does qualify; Completed 10/20/2015  Vision Screening: Recommended annual ophthalmology exams for early detection of glaucoma and other disorders of the eye. Is the patient up to date with their annual eye exam?  Yes  Who is the provider or what is the name of the office in which the patient attends annual eye exams? [facility] If pt is not established with a provider, would they like to be referred to a provider to establish care? No .   Dental Screening: Recommended annual dental exams for proper oral hygiene  Diabetic Foot Exam: N/A  Community Resource Referral / Chronic Care Management: CRR required this visit?  No   CCM required this visit?  No     Plan:     I have personally reviewed and noted the following in the patient's chart:   Medical and social history Use of alcohol, tobacco or illicit drugs  Current medications and supplements including opioid prescriptions. Patient is not currently taking opioid prescriptions. Functional ability and status Nutritional status Physical activity Advanced directives List of other physicians Hospitalizations, surgeries, and ER visits in previous 12  months Vitals Screenings to include cognitive, depression, and falls Referrals and appointments  In addition, I have reviewed and discussed with patient certain preventive protocols, quality metrics, and best practice recommendations. A written personalized care plan for preventive services as well as general preventive health recommendations were provided to patient.     Mickeal Needy, LPN   1/61/0960   After Visit Summary: (Mail) Due to this being a telephonic visit, the after visit summary with patients personalized plan was offered to patient via mail   Nurse Notes: Normal cognitive status assessed by direct observation via telephone conversation by this Nurse Health Advisor. No abnormalities found.

## 2023-01-08 IMAGING — CT CT HEAD W/O CM
4 series · 16 of 47 positions shown, 18 images · non-contrast
Comparison: 08/30/2014

CLINICAL DATA: Headache



[Series 3: head without · axial · non-contrast · 0.39mm/px · z∈[-128,-12]mm · 7 of 31 slices shown, 9 images]
[im 4/31  brain]
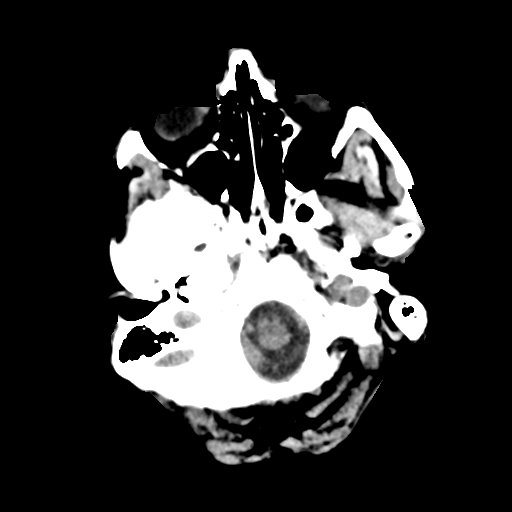
[im 4/31  bone]
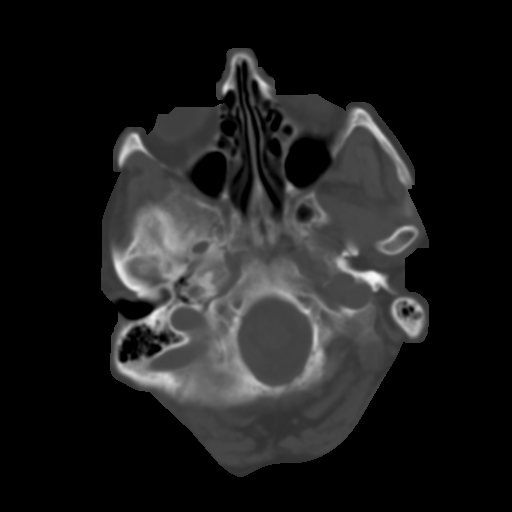
[im 8/31  brain]
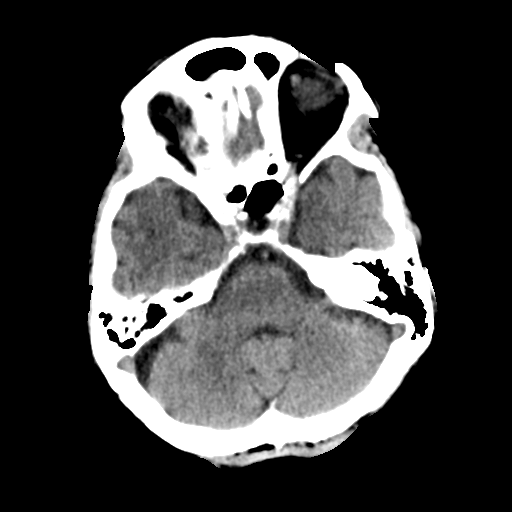
[im 12/31  brain]
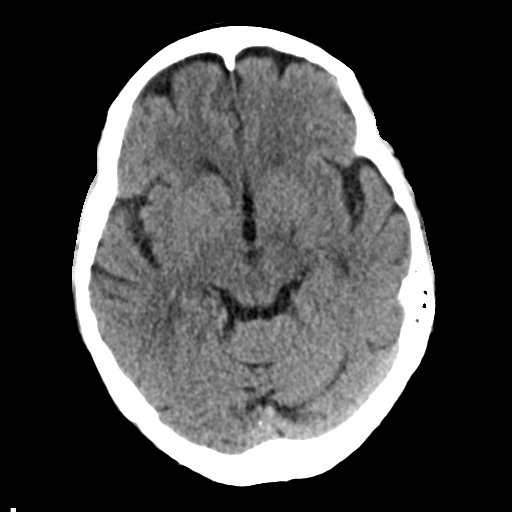
[im 16/31  brain]
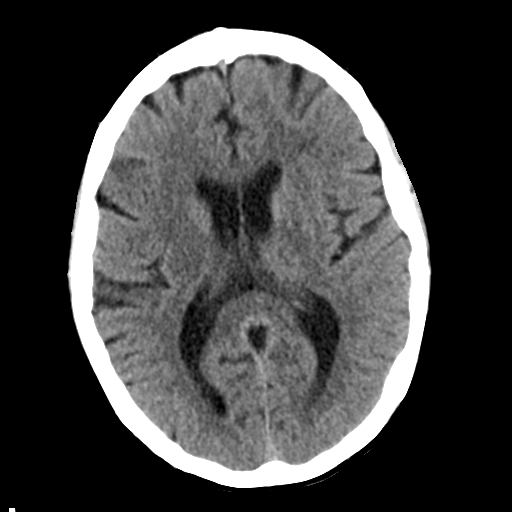
[im 19/31  brain]
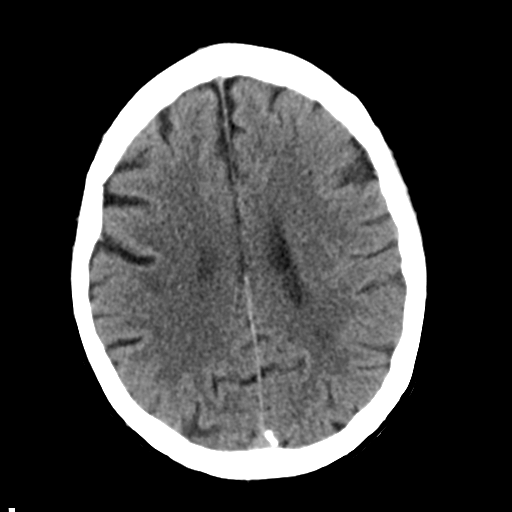
[im 19/31  bone]
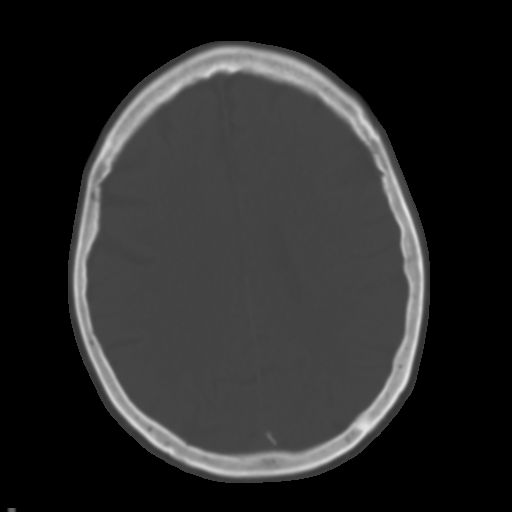
[im 23/31  brain]
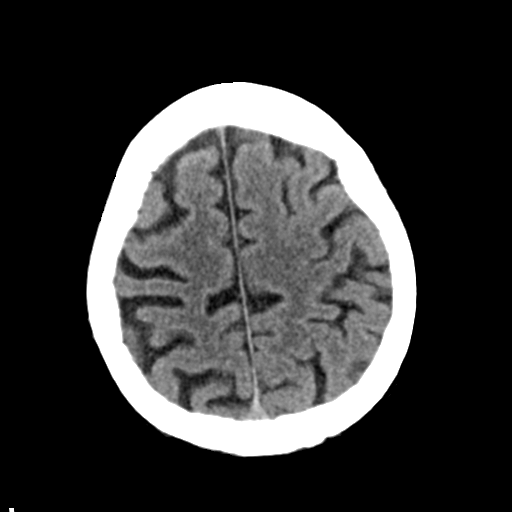
[im 27/31  brain]
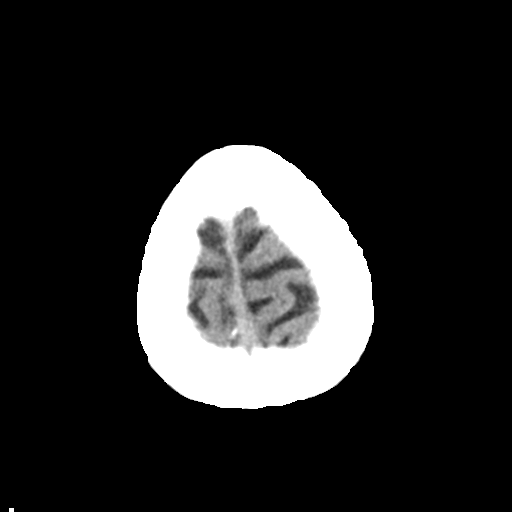

[Series 4: head bone · axial · 0.39mm/px · z∈[-128,-98]mm · 3 of 76 slices shown]
[im 8/76  bone]
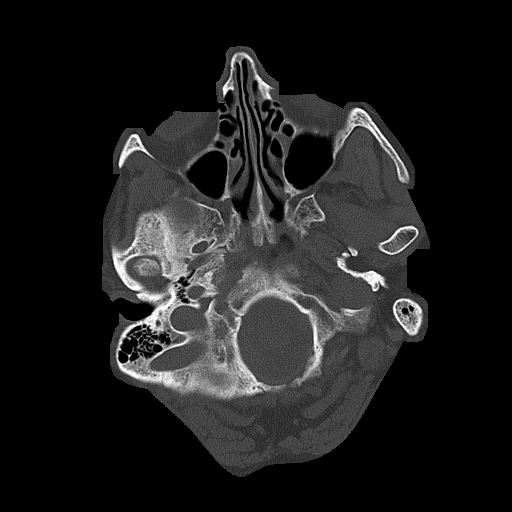
[im 16/76  bone]
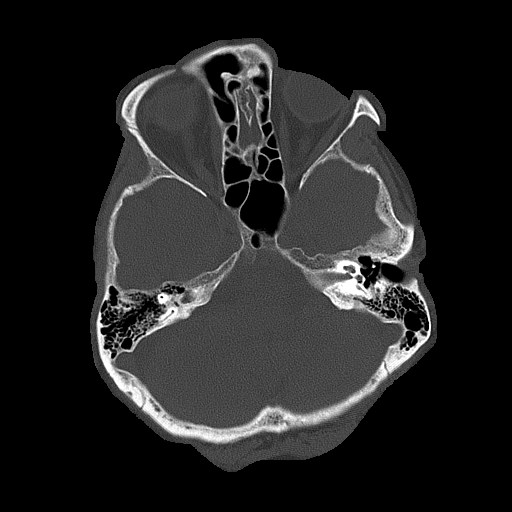
[im 23/76  bone]
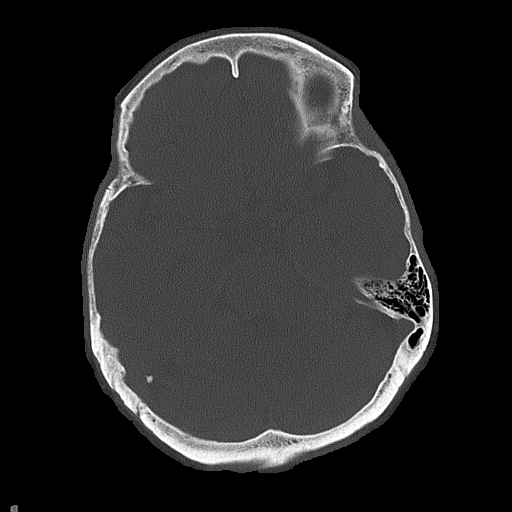

[Series 5: head without cor · coronal · non-contrast · 0.26mm/px · 3 of 67 slices shown]
[im 23/67  brain]
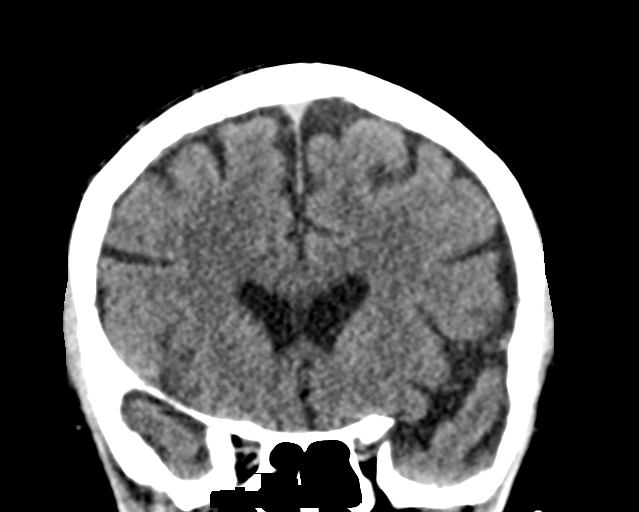
[im 30/67  brain]
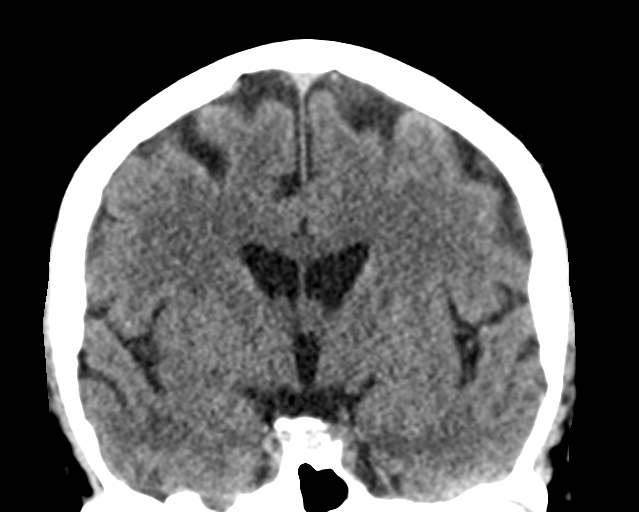
[im 37/67  brain]
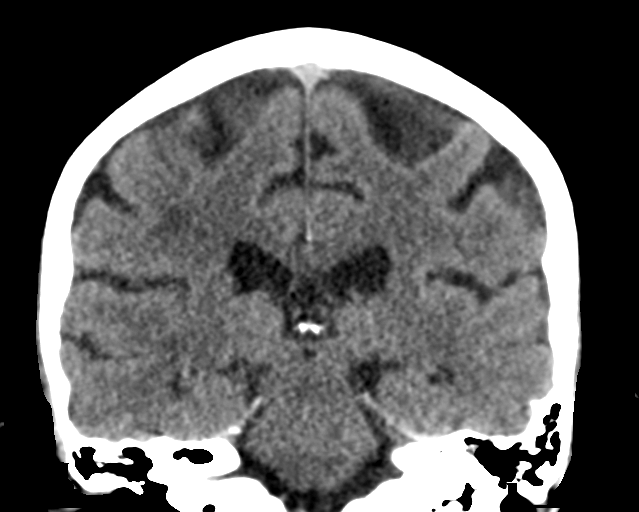

[Series 6: head without sag · sagittal · non-contrast · 0.27mm/px · 3 of 50 slices shown]
[im 17/50  brain]
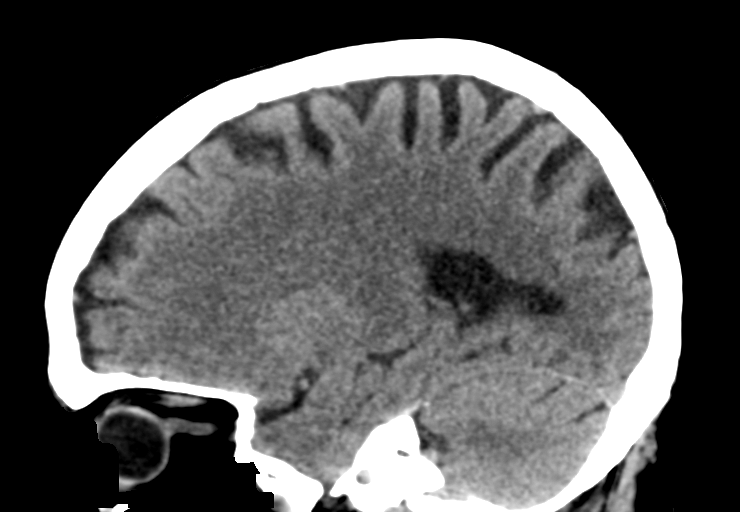
[im 25/50  brain]
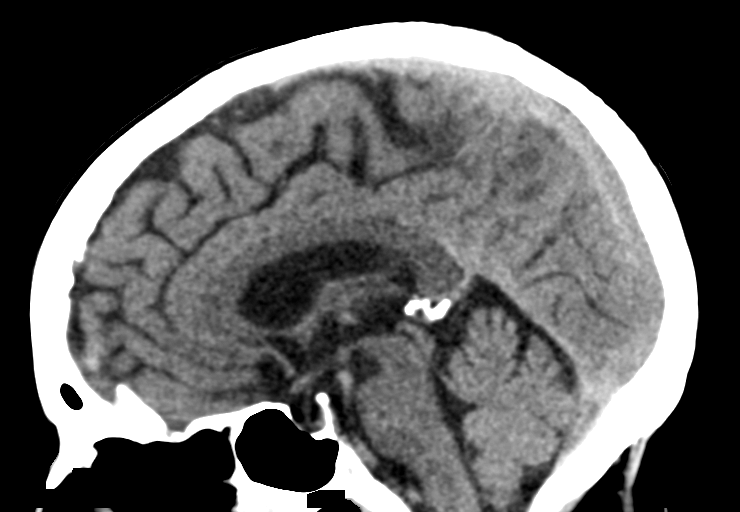
[im 33/50  brain]
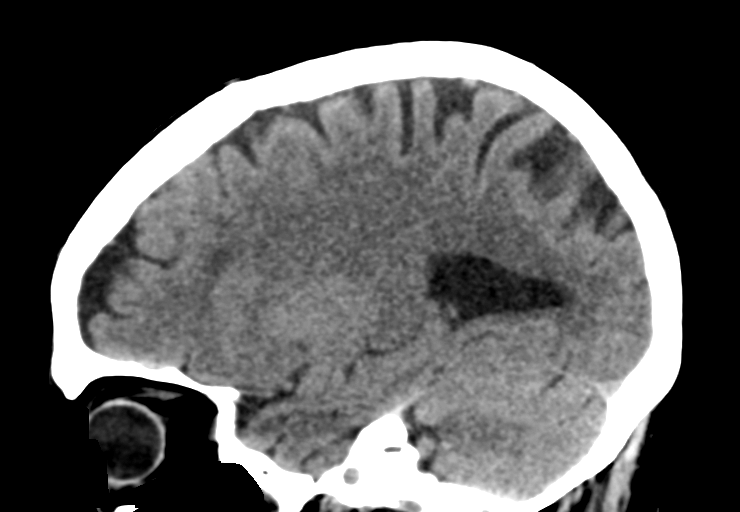

[16 of 47 positions shown; findings below may reference images not displayed]

FINDINGS: Brain: No acute intracranial abnormality. Specifically, no
hemorrhage, hydrocephalus, mass lesion, acute infarction, or
significant intracranial injury.

Vascular: No hyperdense vessel or unexpected calcification.

Skull: No acute calvarial abnormality.

Sinuses/Orbits: No acute findings

Other: None
IMPRESSION: No acute intracranial abnormality.

## 2023-01-18 ENCOUNTER — Telehealth: Payer: Self-pay | Admitting: Internal Medicine

## 2023-01-29 ENCOUNTER — Other Ambulatory Visit: Payer: Self-pay | Admitting: Internal Medicine

## 2023-01-29 ENCOUNTER — Telehealth: Payer: Self-pay | Admitting: Internal Medicine

## 2023-01-29 MED ORDER — LEVOTHYROXINE SODIUM 50 MCG PO TABS: 50.0000 ug | ORAL_TABLET | Freq: Every day | ORAL | 1 refills | Status: DC

## 2023-01-29 NOTE — Telephone Encounter (Signed)
Pharmacy called needing manufacture change for levothyroxine (SYNTHROID) 50 MCG tablet   Eulis Canner [phone]

## 2023-01-30 NOTE — Telephone Encounter (Signed)
Ok for change, thanks 

## 2023-01-30 NOTE — Telephone Encounter (Signed)
Called and let pharmacy know

## 2023-02-02 ENCOUNTER — Other Ambulatory Visit: Payer: Self-pay | Admitting: Internal Medicine

## 2023-02-04 ENCOUNTER — Other Ambulatory Visit: Payer: Self-pay | Admitting: Orthopaedic Surgery

## 2023-02-18 ENCOUNTER — Encounter: Payer: Self-pay | Admitting: Internal Medicine

## 2023-02-18 DIAGNOSIS — R296 Repeated falls: Secondary | ICD-10-CM

## 2023-02-23 ENCOUNTER — Encounter: Payer: Self-pay | Admitting: Internal Medicine

## 2023-02-23 ENCOUNTER — Ambulatory Visit (INDEPENDENT_AMBULATORY_CARE_PROVIDER_SITE_OTHER): Payer: Medicare Other

## 2023-02-23 ENCOUNTER — Ambulatory Visit (INDEPENDENT_AMBULATORY_CARE_PROVIDER_SITE_OTHER): Payer: Medicare Other | Admitting: Internal Medicine

## 2023-02-23 ENCOUNTER — Other Ambulatory Visit: Payer: Self-pay | Admitting: Internal Medicine

## 2023-02-23 VITALS — BP 120/62 | HR 67 | Temp 98.5°F | Ht 64.0 in | Wt 132.0 lb

## 2023-02-23 DIAGNOSIS — E039 Hypothyroidism, unspecified: Secondary | ICD-10-CM

## 2023-02-23 DIAGNOSIS — E7800 Pure hypercholesterolemia, unspecified: Secondary | ICD-10-CM

## 2023-02-23 DIAGNOSIS — R079 Chest pain, unspecified: Secondary | ICD-10-CM

## 2023-02-23 DIAGNOSIS — Z23 Encounter for immunization: Secondary | ICD-10-CM | POA: Diagnosis not present

## 2023-02-23 DIAGNOSIS — I700 Atherosclerosis of aorta: Secondary | ICD-10-CM | POA: Diagnosis not present

## 2023-02-23 DIAGNOSIS — I10 Essential (primary) hypertension: Secondary | ICD-10-CM | POA: Diagnosis not present

## 2023-02-23 DIAGNOSIS — S2242XA Multiple fractures of ribs, left side, initial encounter for closed fracture: Secondary | ICD-10-CM | POA: Diagnosis not present

## 2023-02-23 DIAGNOSIS — R739 Hyperglycemia, unspecified: Secondary | ICD-10-CM | POA: Diagnosis not present

## 2023-02-23 DIAGNOSIS — R0781 Pleurodynia: Secondary | ICD-10-CM | POA: Diagnosis not present

## 2023-02-23 LAB — BASIC METABOLIC PANEL
BUN: 14 mg/dL (ref 6–23)
CO2: 33 meq/L — ABNORMAL HIGH (ref 19–32)
Calcium: 9.9 mg/dL (ref 8.4–10.5)
Chloride: 101 meq/L (ref 96–112)
Creatinine, Ser: 0.7 mg/dL (ref 0.40–1.20)
GFR: 83.09 mL/min (ref >60.00)
Glucose, Bld: 103 mg/dL — ABNORMAL HIGH (ref 70–99)
Potassium: 4 meq/L (ref 3.5–5.1)
Sodium: 143 meq/L (ref 135–145)

## 2023-02-23 LAB — URINALYSIS, ROUTINE W REFLEX MICROSCOPIC
Bilirubin Urine: NEGATIVE
Hgb urine dipstick: NEGATIVE
Ketones, ur: NEGATIVE
Nitrite: NEGATIVE
Specific Gravity, Urine: 1.025 (ref 1.000–1.030)
Total Protein, Urine: NEGATIVE
Urine Glucose: NEGATIVE
Urobilinogen, UA: 1 (ref 0.0–1.0)
pH: 6 (ref 5.0–8.0)

## 2023-02-23 LAB — CBC WITH DIFFERENTIAL/PLATELET
Basophils Absolute: 0 10*3/uL (ref 0.0–0.1)
Basophils Relative: 0.6 % (ref 0.0–3.0)
Eosinophils Absolute: 0.1 10*3/uL (ref 0.0–0.7)
Eosinophils Relative: 1.1 % (ref 0.0–5.0)
HCT: 37.3 % (ref 36.0–46.0)
Hemoglobin: 12.4 g/dL (ref 12.0–15.0)
Lymphocytes Relative: 25.3 % (ref 12.0–46.0)
Lymphs Abs: 1.4 10*3/uL (ref 0.7–4.0)
MCHC: 33.3 g/dL (ref 30.0–36.0)
MCV: 94.9 fL (ref 78.0–100.0)
Monocytes Absolute: 0.5 10*3/uL (ref 0.1–1.0)
Monocytes Relative: 9.2 % (ref 3.0–12.0)
Neutro Abs: 3.6 10*3/uL (ref 1.4–7.7)
Neutrophils Relative %: 63.8 % (ref 43.0–77.0)
Platelets: 243 10*3/uL (ref 150.0–400.0)
RBC: 3.93 Mil/uL (ref 3.87–5.11)
RDW: 13.7 % (ref 11.5–15.5)
WBC: 5.6 10*3/uL (ref 4.0–10.5)

## 2023-02-23 LAB — HEPATIC FUNCTION PANEL
ALT: 11 U/L (ref 0–35)
AST: 17 U/L (ref 0–37)
Albumin: 4.3 g/dL (ref 3.5–5.2)
Alkaline Phosphatase: 69 U/L (ref 39–117)
Bilirubin, Direct: 0.1 mg/dL (ref 0.0–0.3)
Total Bilirubin: 0.9 mg/dL (ref 0.2–1.2)
Total Protein: 7.3 g/dL (ref 6.0–8.3)

## 2023-02-23 LAB — TSH: TSH: 0.75 u[IU]/mL (ref 0.35–5.50)

## 2023-02-23 MED ORDER — REPATHA SURECLICK 140 MG/ML ~~LOC~~ SOAJ: 140.0000 mg | SUBCUTANEOUS | 3 refills | Status: DC

## 2023-02-23 MED ORDER — CEPHALEXIN 500 MG PO CAPS: 500.0000 mg | ORAL_CAPSULE | Freq: Three times a day (TID) | ORAL | 0 refills | Status: DC

## 2023-02-23 NOTE — Progress Notes (Signed)
The test results show that your current treatment is OK, as the tests are stable.  Please continue the same plan.  There is no other need for change of treatment or further evaluation based on these results, at this time.  thanks 

## 2023-02-23 NOTE — Progress Notes (Unsigned)
Chief Complaint: follow up recent fall with left chest pain,       HPI:  Lauren [name] is a 77 y.o. female here with c/o        Wt Readings from Last 3 Encounters:  02/23/23 132 lb (59.9 kg)  01/04/23 134 lb (60.8 kg)  09/07/22 133 lb (60.3 kg)   BP Readings from Last 3 Encounters:  02/23/23 120/62  09/07/22 122/80  08/29/22 110/69         Past Medical History:  Diagnosis Date   Anxiety    Arthritis    Carotid stenosis 10/08/2013   Cervical disc disease    Depression    Diverticulitis    Diverticulosis    Frequent headaches    Hypertension    does not now per pt.   Hypothyroidism 10/26/2015   Impaired glucose tolerance 10/08/2013   normal HgA1C   Neck pain    Psoriasis    Past Surgical History:  Procedure Laterality Date   BREAST BIOPSY Left 2011   benign    CERVICAL FUSION     approx 1980   CHOLECYSTECTOMY     COLONOSCOPY     I & D EXTREMITY Right 03/07/2022   Procedure: IRRIGATION AND DEBRIDEMENT OF FOOT WITH BONE BIOPSY;  Surgeon: Vivi Barrack, DPM;  Location: [facility] OR;  Service: Podiatry;  Laterality: Right;   POSTERIOR CERVICAL FUSION/FORAMINOTOMY N/A 11/05/2014   Procedure: Cervical four to Cervical seven  Posterior Cervical Fusion with lateral mass fixation;  Surgeon: Tia Alert, MD;  Location: [facility];  Service: Neurosurgery;  Laterality: N/A;   C4 - C7 Posterior Cervical Fusion with lateral mass fixation   TOTAL ABDOMINAL HYSTERECTOMY  1998   Fibroid   UPPER GASTROINTESTINAL ENDOSCOPY      reports that she has never smoked. She has never been exposed to tobacco smoke. She has never used smokeless tobacco. She reports that she does not currently use alcohol. She reports that she does not use drugs. family history includes Alcohol abuse in an other family member; Alcoholism in her father; Arthritis in an other family member; Cancer in an other family member; Cervical cancer in her mother; Colon cancer in her father; Diabetes in her brother and  mother; Heart attack in her mother; Heart disease in her mother and another family member; Hypertension in an other family member; Lung cancer in her father; Throat cancer in her brother. Allergies  Allergen Reactions   Celebrex [Celecoxib] Itching and Rash   Current Outpatient Medications on File Prior to Visit  Medication Sig Dispense Refill   acetaminophen-codeine (TYLENOL/CODEINE #3) 300-30 MG tablet Take 1 tablet by mouth every 6 (six) hours as needed. 30 tablet 1   ALPRAZolam (XANAX) 1 MG tablet TAKE 1/2 TO 1 TABLET BY MOUTH TWICE DAILY AS NEEDED 60 tablet 2   amLODipine (NORVASC) 5 MG tablet TAKE 1 TABLET BY MOUTH DAILY 100 tablet 2   aspirin EC 325 MG tablet Take 325 mg by mouth daily.     Cholecalciferol (D3 ADULT PO) Take 1 tablet by mouth daily.     citalopram (CELEXA) 10 MG tablet Take 1 tablet (10 mg total) by mouth daily. 90 tablet 3   famotidine (PEPCID) 40 MG tablet Take 40 mg by mouth daily.     hydrochlorothiazide (MICROZIDE) 12.5 MG capsule TAKE 1 CAPSULE BY MOUTH DAILY 70 capsule 4   ibuprofen (ADVIL) 800 MG tablet TAKE 1 TABLET(800 MG) BY MOUTH TWICE  DAILY AS NEEDED FOR MODERATE PAIN 60 tablet 4   levothyroxine (SYNTHROID) 50 MCG tablet Take 1 tablet (50 mcg total) by mouth daily. 100 tablet 1   pravastatin (PRAVACHOL) 20 MG tablet Take 1 tablet (20 mg total) by mouth daily. 90 tablet 3   RESTASIS 0.05 % ophthalmic emulsion Place 1 drop into both eyes 2 (two) times daily.     zolpidem (AMBIEN) 5 MG tablet TAKE 1 TABLET BY MOUTH AT  BEDTIME AS NEEDED FOR SLEEP 90 tablet 1   No current facility-administered medications on file prior to visit.        ROS:  All others reviewed and negative.  Objective        PE:  BP 120/62 (BP Location: Right Arm, Patient Position: Sitting, Cuff Size: Normal)   Pulse 67   Temp 98.5 F (36.9 C) (Oral)   Ht 5\' 4"  (1.626 m)   Wt 132 lb (59.9 kg)   LMP 05/22/1996   SpO2 98%   BMI 22.66 kg/m                 Constitutional: Pt appears  in NAD               HENT: Head: NCAT.                Right Ear: External ear normal.                 Left Ear: External ear normal.                Eyes: . Pupils are equal, round, and reactive to light. Conjunctivae and EOM are normal               Nose: without d/c or deformity               Neck: Neck supple. Gross normal ROM               Cardiovascular: Normal rate and regular rhythm.                 Pulmonary/Chest: Effort normal and breath sounds without rales or wheezing.                Abd:  Soft, NT, ND, + BS, no organomegaly               Neurological: Pt is alert. At baseline orientation, motor grossly intact               Skin: Skin is warm. No rashes, no other new lesions, LE edema - ***               Psychiatric: Pt behavior is normal without agitation   Micro: none  Cardiac tracings I have personally interpreted today:  none  Pertinent Radiological findings (summarize): none   Lab Results  Component Value Date   WBC 5.3 09/07/2022   HGB 12.6 09/07/2022   HCT 37.7 09/07/2022   PLT 250.0 09/07/2022   GLUCOSE 93 09/07/2022   CHOL 139 09/07/2022   TRIG 195.0 (H) 09/07/2022   HDL 38.60 (L) 09/07/2022   LDLDIRECT 75.0 04/26/2021   LDLCALC 61 09/07/2022   ALT 12 09/07/2022   AST 20 09/07/2022   NA 141 09/07/2022   K 3.9 09/07/2022   CL 100 09/07/2022   CREATININE 0.78 09/07/2022   BUN 12 09/07/2022   CO2 34 (H) 09/07/2022   TSH 4.78 09/07/2022   INR 0.9 07/16/2021  HGBA1C 5.8 09/07/2022   Assessment/Plan:  Lauren [name] is a 77 y.o. White or Caucasian [1] female with  has a past medical history of Anxiety, Arthritis, Carotid stenosis (10/08/2013), Cervical disc disease, Depression, Diverticulitis, Diverticulosis, Frequent headaches, Hypertension, Hypothyroidism (10/26/2015), Impaired glucose tolerance (10/08/2013), Neck pain, and Psoriasis.  No problem-specific Assessment & Plan notes found for this encounter.  Followup: No follow-ups on file.  Oliver Barre,  MD 02/23/2023 8:33 AM [facility] - [facility]

## 2023-02-23 NOTE — Patient Instructions (Addendum)
Ok to stop the pravastatin just in case this has anything to do with the falls  Please take all new medication as prescribed - the Repatha  Please continue all other medications as before, and refills have been done if requested.  Please have the pharmacy call with any other refills you may need.  Please continue your efforts at being more active, low cholesterol diet, and weight control.  Please keep your appointments with your specialists as you may have planned  Please go to the XRAY Department in the first floor for the x-ray testing  Please go to the LAB at the blood drawing area for the tests to be done  You will be contacted by phone if any changes need to be made immediately.  Otherwise, you will receive a letter about your results with an explanation, but please check with MyChart first.  Please make an Appointment to return Oct 23 as planned

## 2023-02-25 ENCOUNTER — Encounter: Payer: Self-pay | Admitting: Internal Medicine

## 2023-02-25 DIAGNOSIS — R079 Chest pain, unspecified: Secondary | ICD-10-CM | POA: Insufficient documentation

## 2023-02-25 NOTE — Assessment & Plan Note (Signed)
Lab Results  Component Value Date   HGBA1C 5.8 09/07/2022   Stable, pt to continue current medical treatment  - diet, wt control

## 2023-02-25 NOTE — Assessment & Plan Note (Signed)
Lab Results  Component Value Date   LDLCALC 61 09/07/2022   Pt no longer wants to take statin - for change to repatha 140 mg

## 2023-02-25 NOTE — Assessment & Plan Note (Signed)
Post fall -for cxr, left rib films r/o fx, declines pain med

## 2023-02-25 NOTE — Assessment & Plan Note (Signed)
Lab Results  Component Value Date   TSH 0.75 02/23/2023   Stable, pt to continue levothyroxine 50 mcg qd

## 2023-02-25 NOTE — Assessment & Plan Note (Signed)
BP Readings from Last 3 Encounters:  02/23/23 120/62  09/07/22 122/80  08/29/22 110/69   Stable, pt to continue medical treatment norvasc 5 every day, hct 12.5 qd

## 2023-02-26 ENCOUNTER — Telehealth: Payer: Self-pay

## 2023-02-26 ENCOUNTER — Encounter: Payer: Self-pay | Admitting: Internal Medicine

## 2023-02-26 ENCOUNTER — Other Ambulatory Visit (HOSPITAL_COMMUNITY): Payer: Self-pay

## 2023-02-26 NOTE — Telephone Encounter (Signed)
Pharmacy Patient Advocate Encounter   Received notification from Physician's Office that prior authorization for Repatha is required/requested.   Insurance verification completed.   The patient is insured through  [employer] Medicare Part D  .   Per test claim: PA required; PA submitted to [employer] Medicare Part D via CoverMyMeds Key/confirmation #/EOC Key: [id number]  Status is pending

## 2023-02-27 ENCOUNTER — Other Ambulatory Visit (HOSPITAL_COMMUNITY): Payer: Self-pay

## 2023-02-27 NOTE — Telephone Encounter (Addendum)
Pharmacy Patient Advocate Encounter  Received notification from [employer]   that Prior Authorization for Repatha has been APPROVED from 10.7.24 to 4.7.25 . Cost is 141.00 for 3months   PA #/Case ID/Reference #: Key: [id number]

## 2023-03-05 NOTE — Telephone Encounter (Signed)
Yes as this was doing well at the last visit when we were told you were taking both of them on med review.  Please let us know if you were not actually taking these at your last visit.   thanks

## 2023-03-06 MED ORDER — EZETIMIBE 10 MG PO TABS: 10.0000 mg | ORAL_TABLET | Freq: Every day | ORAL | 3 refills | Status: DC

## 2023-03-14 ENCOUNTER — Ambulatory Visit (INDEPENDENT_AMBULATORY_CARE_PROVIDER_SITE_OTHER): Payer: Medicare Other | Admitting: Internal Medicine

## 2023-03-14 VITALS — BP 120/70 | HR 70 | Temp 98.4°F | Ht 64.0 in | Wt 134.0 lb

## 2023-03-14 DIAGNOSIS — I10 Essential (primary) hypertension: Secondary | ICD-10-CM

## 2023-03-14 DIAGNOSIS — E559 Vitamin D deficiency, unspecified: Secondary | ICD-10-CM

## 2023-03-14 DIAGNOSIS — R739 Hyperglycemia, unspecified: Secondary | ICD-10-CM

## 2023-03-14 DIAGNOSIS — R296 Repeated falls: Secondary | ICD-10-CM | POA: Diagnosis not present

## 2023-03-14 DIAGNOSIS — N390 Urinary tract infection, site not specified: Secondary | ICD-10-CM | POA: Diagnosis not present

## 2023-03-14 DIAGNOSIS — E039 Hypothyroidism, unspecified: Secondary | ICD-10-CM

## 2023-03-14 DIAGNOSIS — I6529 Occlusion and stenosis of unspecified carotid artery: Secondary | ICD-10-CM

## 2023-03-14 DIAGNOSIS — E7800 Pure hypercholesterolemia, unspecified: Secondary | ICD-10-CM | POA: Diagnosis not present

## 2023-03-14 DIAGNOSIS — F32A Depression, unspecified: Secondary | ICD-10-CM

## 2023-03-14 NOTE — Progress Notes (Signed)
Patient ID: Lauren [name], female   DOB: Dec 09, 1945, 77 y.o.   MRN: [id number]        Chief Complaint: follow up lo vit d, low thyroid, hyperglycemia, hld, htn, depression anxiety, recurrent falls, uti       HPI:  Lauren [name] is a 77 y.o. female here overall doing ok; Pt denies chest pain, increased sob or doe, wheezing, orthopnea, PND, increased LE swelling, palpitations, dizziness or syncope.   Pt denies polydipsia, polyuria, or new focal neuro s/s.    Pt denies fever, wt loss, night sweats, loss of appetite, or other constitutional symptoms  Denies urinary symptoms such as dysuria, frequency, urgency, flank pain, hematuria or n/v, fever, chills.  Has had several falls in recent weeks for unclear reason.  Denies urinary symptoms such as dysuria, frequency, urgency, flank pain, hematuria or n/v, fever, chills, and now finishing antibx for recent uti.  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  Also, repatha too expensive.  Taking ASA 325 mg daily for years, and hesitates to reduce to asa 81 mg.   Wt Readings from Last 3 Encounters:  03/14/23 134 lb (60.8 kg)  02/23/23 132 lb (59.9 kg)  01/04/23 134 lb (60.8 kg)   BP Readings from Last 3 Encounters:  03/14/23 120/70  02/23/23 120/62  09/07/22 122/80         Past Medical History:  Diagnosis Date   Anxiety    Arthritis    Carotid stenosis 10/08/2013   Cervical disc disease    Depression    Diverticulitis    Diverticulosis    Frequent headaches    Hypertension    does not now per pt.   Hypothyroidism 10/26/2015   Impaired glucose tolerance 10/08/2013   normal HgA1C   Neck pain    Psoriasis    Past Surgical History:  Procedure Laterality Date   BREAST BIOPSY Left 2011   benign    CERVICAL FUSION     approx 1980   CHOLECYSTECTOMY     COLONOSCOPY     I & D EXTREMITY Right 03/07/2022   Procedure: IRRIGATION AND DEBRIDEMENT OF FOOT WITH BONE BIOPSY;   Surgeon: Vivi Barrack, DPM;  Location: [facility];  Service: Podiatry;  Laterality: Right;   POSTERIOR CERVICAL FUSION/FORAMINOTOMY N/A 11/05/2014   Procedure: Cervical four to Cervical seven  Posterior Cervical Fusion with lateral mass fixation;  Surgeon: Tia Alert, MD;  Location: [facility];  Service: Neurosurgery;  Laterality: N/A;   C4 - C7 Posterior Cervical Fusion with lateral mass fixation   TOTAL ABDOMINAL HYSTERECTOMY  1998   Fibroid   UPPER GASTROINTESTINAL ENDOSCOPY      reports that she has never smoked. She has never been exposed to tobacco smoke. She has never used smokeless tobacco. She reports that she does not currently use alcohol. She reports that she does not use drugs. family history includes Alcohol abuse in an other family member; Alcoholism in her father; Arthritis in an other family member; Cancer in an other family member; Cervical cancer in her mother; Colon cancer in her father; Diabetes in her brother and mother; Heart attack in her mother; Heart disease in her mother and another family member; Hypertension in an other family member; Lung cancer in her father; Throat cancer in her brother. Allergies  Allergen Reactions   Celebrex [Celecoxib] Itching and Rash   Current Outpatient Medications on  File Prior to Visit  Medication Sig Dispense Refill   acetaminophen-codeine (TYLENOL/CODEINE #3) 300-30 MG tablet Take 1 tablet by mouth every 6 (six) hours as needed. 30 tablet 1   ALPRAZolam (XANAX) 1 MG tablet TAKE 1/2 TO 1 TABLET BY MOUTH TWICE DAILY AS NEEDED 60 tablet 2   aspirin EC 325 MG tablet Take 325 mg by mouth daily.     cephALEXin (KEFLEX) 500 MG capsule Take 1 capsule (500 mg total) by mouth 3 (three) times daily. 30 capsule 0   Cholecalciferol (D3 ADULT PO) Take 1 tablet by mouth daily.     citalopram (CELEXA) 10 MG tablet Take 1 tablet (10 mg total) by mouth daily. 90 tablet 3   ezetimibe (ZETIA) 10 MG tablet Take 1 tablet (10 mg total) by mouth daily. 90  tablet 3   famotidine (PEPCID) 40 MG tablet Take 40 mg by mouth daily.     ibuprofen (ADVIL) 800 MG tablet TAKE 1 TABLET(800 MG) BY MOUTH TWICE DAILY AS NEEDED FOR MODERATE PAIN 60 tablet 4   levothyroxine (SYNTHROID) 50 MCG tablet Take 1 tablet (50 mcg total) by mouth daily. 100 tablet 1   RESTASIS 0.05 % ophthalmic emulsion Place 1 drop into both eyes 2 (two) times daily.     zolpidem (AMBIEN) 5 MG tablet TAKE 1 TABLET BY MOUTH AT  BEDTIME AS NEEDED FOR SLEEP 90 tablet 1   amLODipine (NORVASC) 5 MG tablet TAKE 1 TABLET BY MOUTH DAILY (Patient not taking: Reported on 03/14/2023) 100 tablet 2   No current facility-administered medications on file prior to visit.        ROS:  All others reviewed and negative.  Objective        PE:  BP 120/70 (BP Location: Right Arm, Patient Position: Sitting, Cuff Size: Normal)   Pulse 70   Temp 98.4 F (36.9 C) (Oral)   Ht 5\' 4"  (1.626 m)   Wt 134 lb (60.8 kg)   LMP 05/22/1996   SpO2 96%   BMI 23.00 kg/m                 Constitutional: Pt appears in NAD               HENT: Head: NCAT.                Right Ear: External ear normal.                 Left Ear: External ear normal.                Eyes: . Pupils are equal, round, and reactive to light. Conjunctivae and EOM are normal               Nose: without d/c or deformity               Neck: Neck supple. Gross normal ROM               Cardiovascular: Normal rate and regular rhythm.                 Pulmonary/Chest: Effort normal and breath sounds without rales or wheezing.                Abd:  Soft, NT, ND, + BS, no organomegaly               Neurological: Pt is alert. At baseline orientation, motor grossly intact  Skin: Skin is warm. No rashes, no other new lesions, LE edema - none               Psychiatric: Pt behavior is normal without agitation   Micro: none  Cardiac tracings I have personally interpreted today:  none  Pertinent Radiological findings (summarize): none    Lab Results  Component Value Date   WBC 5.6 02/23/2023   HGB 12.4 02/23/2023   HCT 37.3 02/23/2023   PLT 243.0 02/23/2023   GLUCOSE 103 (H) 02/23/2023   CHOL 139 09/07/2022   TRIG 195.0 (H) 09/07/2022   HDL 38.60 (L) 09/07/2022   LDLDIRECT 75.0 04/26/2021   LDLCALC 61 09/07/2022   ALT 11 02/23/2023   AST 17 02/23/2023   NA 143 02/23/2023   K 4.0 02/23/2023   CL 101 02/23/2023   CREATININE 0.70 02/23/2023   BUN 14 02/23/2023   CO2 33 (H) 02/23/2023   TSH 0.75 02/23/2023   INR 0.9 07/16/2021   HGBA1C 5.8 09/07/2022   Assessment/Plan:  Lauren [name] is a 77 y.o. White or Caucasian [1] female with  has a past medical history of Anxiety, Arthritis, Carotid stenosis (10/08/2013), Cervical disc disease, Depression, Diverticulitis, Diverticulosis, Frequent headaches, Hypertension, Hypothyroidism (10/26/2015), Impaired glucose tolerance (10/08/2013), Neck pain, and Psoriasis.  Carotid stenosis Pt encouraged to reduce asa 325 to 81 mg qd  Vitamin D deficiency Last vitamin D Lab Results  Component Value Date   VD25OH 42.69 09/07/2022   Stable, cont oral replacement   UTI (urinary tract infection) Asympt, to finish current antibx  Recurrent falls Pt declines PT referral need  Hypothyroidism Lab Results  Component Value Date   TSH 0.75 02/23/2023   Stable, pt to continue levothyroxine 50 mcg qd   Hyperglycemia Lab Results  Component Value Date   HGBA1C 5.8 09/07/2022   Stable, pt to continue current medical treatment  - diet, wt control   HLD (hyperlipidemia) Lab Results  Component Value Date   LDLCALC 61 09/07/2022   Stable, pt to change repatha to zetia 10 every day due to cost, may reconsider after jan 1 after donut hole no longer an economic factor   Essential hypertension BP Readings from Last 3 Encounters:  03/14/23 120/70  02/23/23 120/62  09/07/22 122/80   Stable, pt to continue medical treatment hct 12.5 every day, norvasc 5 qd   depression  and anxiety  Chronic mild persistent , declines need for change in tx or referral for counseling  Followup: Return in about 6 months (around 09/12/2023).  Oliver Barre, MD 03/18/2023 9:56 AM [facility]

## 2023-03-16 ENCOUNTER — Other Ambulatory Visit: Payer: Self-pay

## 2023-03-16 ENCOUNTER — Other Ambulatory Visit: Payer: Self-pay | Admitting: Internal Medicine

## 2023-03-18 ENCOUNTER — Encounter: Payer: Self-pay | Admitting: Internal Medicine

## 2023-03-18 DIAGNOSIS — N390 Urinary tract infection, site not specified: Secondary | ICD-10-CM | POA: Insufficient documentation

## 2023-03-18 NOTE — Assessment & Plan Note (Signed)
Pt encouraged to reduce asa 325 to 81 mg qd

## 2023-03-18 NOTE — Assessment & Plan Note (Signed)
Lab Results  Component Value Date   LDLCALC 61 09/07/2022   Stable, pt to change repatha to zetia 10 every day due to cost, may reconsider after jan 1 after donut hole no longer an economic factor

## 2023-03-18 NOTE — Assessment & Plan Note (Signed)
Last vitamin D Lab Results  Component Value Date   VD25OH 42.69 09/07/2022   Stable, cont oral replacement

## 2023-03-18 NOTE — Assessment & Plan Note (Signed)
Lab Results  Component Value Date   TSH 0.75 02/23/2023   Stable, pt to continue levothyroxine 50 mcg qd

## 2023-03-18 NOTE — Assessment & Plan Note (Signed)
BP Readings from Last 3 Encounters:  03/14/23 120/70  02/23/23 120/62  09/07/22 122/80   Stable, pt to continue medical treatment hct 12.5 every day, norvasc 5 qd

## 2023-03-18 NOTE — Assessment & Plan Note (Signed)
Asympt, to finish current antibx

## 2023-03-18 NOTE — Assessment & Plan Note (Signed)
Pt declines PT referral need

## 2023-03-18 NOTE — Assessment & Plan Note (Signed)
Lab Results  Component Value Date   HGBA1C 5.8 09/07/2022   Stable, pt to continue current medical treatment  - diet, wt control

## 2023-03-18 NOTE — Patient Instructions (Signed)
Ok to change the Repatha to zetia 10 mg every day  Please let us know if you change your mind about referral for PT  Please consider reducing the asa 325 to 81 mg every day  Ok to finish the antibx for UTI as you are doing  Please continue all other medications as before, and refills have been done if requested.  Please have the pharmacy call with any other refills you may need.  Please continue your efforts at being more active, low cholesterol diet, and weight control.  Please keep your appointments with your specialists as you may have planned

## 2023-03-18 NOTE — Assessment & Plan Note (Signed)
Chronic mild persistent , declines need for change in tx or referral for counseling

## 2023-03-21 ENCOUNTER — Other Ambulatory Visit: Payer: Self-pay | Admitting: Internal Medicine

## 2023-03-31 ENCOUNTER — Other Ambulatory Visit: Payer: Self-pay | Admitting: Internal Medicine

## 2023-04-23 ENCOUNTER — Telehealth: Payer: Self-pay | Admitting: Internal Medicine

## 2023-04-23 MED ORDER — ZOLPIDEM TARTRATE 5 MG PO TABS: 5.0000 mg | ORAL_TABLET | Freq: Every evening | ORAL | 1 refills | Status: DC | PRN

## 2023-04-23 NOTE — Telephone Encounter (Signed)
Ok for [employer] to [employer], but I am not sure what else to send, and we try not to do antibx on request for example, due to risk of overuse

## 2023-04-23 NOTE — Telephone Encounter (Signed)
Also forgot to add the pharmacy if you was to send the sinus meds : [id number] [place], [place] - [address] AT [facility] [address] [facility] [address] [place] [address] Phone: [phone] Fax: [phone]  The prescription request pharmacy can stay the same with [facility]) - [place], [place] - [address] [facility] [address] [place] [address] [place] [address] Phone: [phone] Fax: [phone] PLEASE ADVISE, Lynford Humphrey

## 2023-04-23 NOTE — Telephone Encounter (Signed)
  Pt needs a refill on meds below and ALSO requested if she could get something prescribed to her for her SINUSES.   Prescription Request  04/23/2023  LOV: 03/14/2023  What is the name of the medication or equipment? zolpidem (AMBIEN) 5 MG table   Have you contacted your pharmacy to request a refill? No   Which pharmacy would you like this sent to?   [facility]) [place], [place] - [address] [facility] [address] [place] [address] [place] [address] Phone: [phone] Fax: [phone]  Patient notified that their request is being sent to the clinical staff for review and that they should receive a response within 2 business days.   Please advise at Mobile [phone] (mobile)

## 2023-04-25 ENCOUNTER — Ambulatory Visit (HOSPITAL_COMMUNITY)
Admission: EM | Admit: 2023-04-25 | Discharge: 2023-04-25 | Disposition: A | Discharge status: Home / Self Care | Payer: Medicare Other

## 2023-04-25 ENCOUNTER — Encounter (HOSPITAL_COMMUNITY): Payer: Self-pay

## 2023-04-25 DIAGNOSIS — R0981 Nasal congestion: Secondary | ICD-10-CM | POA: Diagnosis not present

## 2023-04-25 MED ORDER — FLUTICASONE PROPIONATE 50 MCG/ACT NA SUSP: 1.0000 | Freq: Every day | NASAL | 0 refills | Status: DC

## 2023-04-25 NOTE — Discharge Instructions (Signed)
As we discussed, the blood in your nose is typically due to inflammation and dryness.  Recommend saline nasal sprays and using humidifier.  I have also prescribed you Flonase if these first 2 methods are not helpful.  Please follow-up if any symptoms persist or worsen.

## 2023-04-25 NOTE — ED Provider Notes (Signed)
[facility]    CSN: [id number] Arrival date & time: 04/25/23  0801      History   Chief Complaint Chief Complaint  Patient presents with   Nasal Congestion    HPI Lauren [name] is a 77 y.o. female.   Patient presents with nasal congestion and blood noticeable in her nasal mucus over the past 4 days.  She has also had a headache in the frontal portion of her head.  Denies cough, fever, chills, body aches.  Denies any known sick contacts.  Has been taking Claritin-D with no improvement.  She does not take any blood thinning medications.     Past Medical History:  Diagnosis Date   Anxiety    Arthritis    Carotid stenosis 10/08/2013   Cervical disc disease    Depression    Diverticulitis    Diverticulosis    Frequent headaches    Hypertension    does not now per pt.   Hypothyroidism 10/26/2015   Impaired glucose tolerance 10/08/2013   normal HgA1C   Neck pain    Psoriasis     Patient Active Problem List   Diagnosis Date Noted   UTI (urinary tract infection) 03/18/2023   Left-sided chest pain 02/25/2023   Weight loss 09/07/2022   Fatigue 09/07/2022   Right flank pain 09/07/2022   Right foot pain 05/03/2022   HLD (hyperlipidemia) 05/03/2022   Medication monitoring encounter 03/23/2022   Acute osteomyelitis of metatarsal bone of right foot (HCC) 03/07/2022   Hypokalemia 03/07/2022   GERD (gastroesophageal reflux disease) 03/07/2022   Anterior neck pain 12/23/2021   Neck mass 12/23/2021   Cough 10/27/2021   Recurrent falls 09/04/2021   Headache 07/19/2021   Unilateral primary osteoarthritis, left knee 06/23/2020   Chondromalacia patellae, left knee 06/01/2020   Synovitis of left knee 01/14/2020   Hyperglycemia 12/23/2018   Hematuria 12/23/2018   Vitamin D deficiency 10/25/2017   Rash 05/24/2017   Obesity 05/24/2017   Hypothyroidism 10/26/2015   Peripheral edema 10/22/2015   Cervical dystonia 07/14/2015   Weakness generalized 11/16/2014    Insomnia 11/16/2014   S/P cervical spinal fusion 11/05/2014   Essential hypertension 10/14/2014   Fracture, cervical vertebra (HCC) 08/30/2014   Encounter for well adult exam with abnormal findings 10/08/2013   Carotid stenosis 10/08/2013   depression and anxiety     Diverticulitis    Anxiety    Cervical disc disease    Diverticulitis of colon (without mention of hemorrhage)(562.11) 05/10/2013    Past Surgical History:  Procedure Laterality Date   BREAST BIOPSY Left 2011   benign    CERVICAL FUSION     approx 1980   CHOLECYSTECTOMY     COLONOSCOPY     I & D EXTREMITY Right 03/07/2022   Procedure: IRRIGATION AND DEBRIDEMENT OF FOOT WITH BONE BIOPSY;  Surgeon: Vivi Barrack, DPM;  Location: [facility];  Service: Podiatry;  Laterality: Right;   POSTERIOR CERVICAL FUSION/FORAMINOTOMY N/A 11/05/2014   Procedure: Cervical four to Cervical seven  Posterior Cervical Fusion with lateral mass fixation;  Surgeon: Tia Alert, MD;  Location: [facility];  Service: Neurosurgery;  Laterality: N/A;   C4 - C7 Posterior Cervical Fusion with lateral mass fixation   TOTAL ABDOMINAL HYSTERECTOMY  1998   Fibroid   UPPER GASTROINTESTINAL ENDOSCOPY      OB History     Gravida  2   Para  2   Term  2   Preterm  0  AB  0   Living  2      SAB  0   IAB  0   Ectopic  0   Multiple  0   Live Births  2            Home Medications    Prior to Admission medications   Medication Sig Start Date End Date Taking? Authorizing Provider  aspirin EC 81 MG tablet Take 81 mg by mouth daily. Swallow whole.   Yes [provider]  fluticasone (FLONASE) 50 MCG/ACT nasal spray Place 1 spray into both nostrils daily. 04/25/23  Yes Tysha Grismore, Rolly Salter E, FNP  hydrochlorothiazide (MICROZIDE) 12.5 MG capsule TAKE 1 CAPSULE BY MOUTH DAILY 03/16/23   Corwin Levins, MD  acetaminophen-codeine (TYLENOL/CODEINE #3) 300-30 MG tablet Take 1 tablet by mouth every 6 (six) hours as needed. 09/25/22   Corwin Levins, MD  ALPRAZolam Prudy Feeler) 1 MG tablet TAKE 1/2 TO 1 TABLET BY MOUTH TWICE DAILY AS NEEDED 03/23/23   Pincus Sanes, MD  amLODipine (NORVASC) 5 MG tablet TAKE 1 TABLET BY MOUTH DAILY 10/10/22   Corwin Levins, MD  aspirin EC 325 MG tablet Take 325 mg by mouth daily. Patient not taking: Reported on 04/25/2023    [provider]  cephALEXin (KEFLEX) 500 MG capsule Take 1 capsule (500 mg total) by mouth 3 (three) times daily. 02/23/23   Corwin Levins, MD  Cholecalciferol (D3 ADULT PO) Take 1 tablet by mouth daily.    [provider]  citalopram (CELEXA) 10 MG tablet Take 1 tablet (10 mg total) by mouth daily. 05/02/22 05/02/23  Corwin Levins, MD  ezetimibe (ZETIA) 10 MG tablet Take 1 tablet (10 mg total) by mouth daily. 03/06/23   Corwin Levins, MD  famotidine (PEPCID) 40 MG tablet Take 40 mg by mouth daily. 01/17/22   [provider]  ibuprofen (ADVIL) 800 MG tablet TAKE 1 TABLET(800 MG) BY MOUTH TWICE DAILY AS NEEDED FOR MODERATE PAIN 02/05/23   Eldred Manges, MD  levothyroxine (SYNTHROID) 50 MCG tablet Take 1 tablet (50 mcg total) by mouth daily. 01/29/23   Corwin Levins, MD  pravastatin (PRAVACHOL) 20 MG tablet TAKE 1 TABLET BY MOUTH DAILY 04/02/23   Corwin Levins, MD  RESTASIS 0.05 % ophthalmic emulsion Place 1 drop into both eyes 2 (two) times daily. 04/18/19   [provider]  zolpidem (AMBIEN) 5 MG tablet Take 1 tablet (5 mg total) by mouth at bedtime as needed. for sleep 04/23/23   Corwin Levins, MD    Family History Family History  Problem Relation Age of Onset   Heart disease Mother    Diabetes Mother    Cervical cancer Mother    Heart attack Mother    Lung cancer Father    Alcoholism Father    Colon cancer Father    Diabetes Brother    Throat cancer Brother    Cancer Other        lung cancer   Heart disease Other    Hypertension Other    Arthritis Other    Alcohol abuse Other    Rectal cancer Neg Hx    Stomach cancer Neg Hx     Social  History Social History   Tobacco Use   Smoking status: Never    Passive exposure: Never   Smokeless tobacco: Never  Vaping Use   Vaping status: Never Used  Substance Use Topics   Alcohol use: Not  Currently   Drug use: No     Allergies   Celebrex [celecoxib]   Review of Systems Review of Systems Per HPI  Physical Exam Triage Vital Signs ED Triage Vitals  Encounter Vitals Group     BP 04/25/23 0818 132/61     Systolic BP Percentile --      Diastolic BP Percentile --      Pulse Rate 04/25/23 0818 85     Resp 04/25/23 0818 16     Temp 04/25/23 0818 98 F (36.7 C)     Temp Source 04/25/23 0818 Oral     SpO2 04/25/23 0818 95 %     Weight 04/25/23 0816 135 lb (61.2 kg)     Height 04/25/23 0816 5\' 4"  (1.626 m)     Head Circumference --      Peak Flow --      Pain Score 04/25/23 0816 0     Pain Loc --      Pain Education --      Exclude from Growth Chart --    No data found.  Updated Vital Signs BP 132/61 (BP Location: Right Arm)   Pulse 85   Temp 98 F (36.7 C) (Oral)   Resp 16   Ht 5\' 4"  (1.626 m)   Wt 135 lb (61.2 kg)   LMP 05/22/1996   SpO2 95%   BMI 23.17 kg/m   Visual Acuity Right Eye Distance:   Left Eye Distance:   Bilateral Distance:    Right Eye Near:   Left Eye Near:    Bilateral Near:     Physical Exam Constitutional:      General: She is not in acute distress.    Appearance: Normal appearance. She is not toxic-appearing or diaphoretic.  HENT:     Head: Normocephalic and atraumatic.     Right Ear: Tympanic membrane and ear canal normal.     Left Ear: Tympanic membrane and ear canal normal.     Nose: Congestion present.     Right Nostril: No epistaxis.     Left Nostril: No epistaxis.     Mouth/Throat:     Mouth: Mucous membranes are moist.     Pharynx: No posterior oropharyngeal erythema.  Eyes:     Extraocular Movements: Extraocular movements intact.     Conjunctiva/sclera: Conjunctivae normal.     Pupils: Pupils are equal,  round, and reactive to light.  Cardiovascular:     Rate and Rhythm: Normal rate and regular rhythm.     Pulses: Normal pulses.     Heart sounds: Normal heart sounds.  Pulmonary:     Effort: Pulmonary effort is normal. No respiratory distress.     Breath sounds: Normal breath sounds. No stridor. No wheezing, rhonchi or rales.  Musculoskeletal:        General: Normal range of motion.     Cervical back: Normal range of motion.  Skin:    General: Skin is warm and dry.  Neurological:     General: No focal deficit present.     Mental Status: She is alert and oriented to person, place, and time. Mental status is at baseline.  Psychiatric:        Mood and Affect: Mood normal.        Behavior: Behavior normal.      UC Treatments / Results  Labs (all labs ordered are listed, but only abnormal results are displayed) Labs Reviewed - No data to display  EKG  Radiology No results found.  Procedures Procedures (including critical care time)  Medications Ordered in UC Medications - No data to display  Initial Impression / Assessment and Plan / UC Course  I have reviewed the triage vital signs and the nursing notes.  Pertinent labs & imaging results that were available during my care of the patient were reviewed by me and considered in my medical decision making (see chart for details).     Differential diagnoses include viral illness versus allergic rhinitis.  No indication of secondary bacterial infection on exam that would warrant antibiotic therapy.  Discussed with patient that blood in nasal mucus is most likely due to dry, cold weather versus viral illness causing nasal congestion.  Offered COVID testing but she declined.  Recommended using humidifier and saline nasal spray.  Will prescribe Flonase in case these methods are not helpful.  Encouraged patient to follow-up if any symptoms persist or worsen.  Patient verbalized understanding and was agreeable with plan. Final  Clinical Impressions(s) / UC Diagnoses   Final diagnoses:  Nasal congestion     Discharge Instructions      As we discussed, the blood in your nose is typically due to inflammation and dryness.  Recommend saline nasal sprays and using humidifier.  I have also prescribed you Flonase if these first 2 methods are not helpful.  Please follow-up if any symptoms persist or worsen.    ED Prescriptions     Medication Sig Dispense Auth. Provider   fluticasone (FLONASE) 50 MCG/ACT nasal spray Place 1 spray into both nostrils daily. 16 g Gustavus Bryant, [place]      PDMP not reviewed this encounter.   Gustavus Bryant, [place] 04/25/23 [phone]

## 2023-04-25 NOTE — ED Triage Notes (Signed)
Patient here today with c/o nasal congestion with blood in her mucous and headache X 4 days. She has been taking Claritin D with some relief. No sick contacts.

## 2023-05-03 ENCOUNTER — Other Ambulatory Visit: Payer: Self-pay | Admitting: Internal Medicine

## 2023-05-03 ENCOUNTER — Other Ambulatory Visit: Payer: Self-pay

## 2023-06-06 ENCOUNTER — Other Ambulatory Visit: Payer: Self-pay | Admitting: Internal Medicine

## 2023-06-06 DIAGNOSIS — Z1231 Encounter for screening mammogram for malignant neoplasm of breast: Secondary | ICD-10-CM

## 2023-06-20 DIAGNOSIS — L700 Acne vulgaris: Secondary | ICD-10-CM | POA: Diagnosis not present

## 2023-06-20 DIAGNOSIS — L718 Other rosacea: Secondary | ICD-10-CM | POA: Diagnosis not present

## 2023-06-20 DIAGNOSIS — L821 Other seborrheic keratosis: Secondary | ICD-10-CM | POA: Diagnosis not present

## 2023-06-20 DIAGNOSIS — D485 Neoplasm of uncertain behavior of skin: Secondary | ICD-10-CM | POA: Diagnosis not present

## 2023-06-21 ENCOUNTER — Other Ambulatory Visit: Payer: Self-pay | Admitting: Internal Medicine

## 2023-06-22 ENCOUNTER — Other Ambulatory Visit: Payer: Self-pay

## 2023-07-07 ENCOUNTER — Other Ambulatory Visit: Payer: Self-pay | Admitting: Internal Medicine

## 2023-07-09 ENCOUNTER — Other Ambulatory Visit: Payer: Self-pay

## 2023-07-23 ENCOUNTER — Ambulatory Visit: Payer: Medicare Other

## 2023-07-31 DIAGNOSIS — D3131 Benign neoplasm of right choroid: Secondary | ICD-10-CM | POA: Diagnosis not present

## 2023-08-01 ENCOUNTER — Other Ambulatory Visit: Payer: Self-pay | Admitting: Internal Medicine

## 2023-08-01 MED ORDER — ZOLPIDEM TARTRATE 5 MG PO TABS: 5.0000 mg | ORAL_TABLET | Freq: Every evening | ORAL | 1 refills | Status: DC | PRN

## 2023-08-01 NOTE — Telephone Encounter (Signed)
 Copied from CRM [id number]. Topic: Clinical - Medication Refill >> Aug 01, 2023  9:42 AM Randa Ngo wrote: Most Recent Primary Care Visit:  Provider: Corwin Levins  Department: [facility]  Visit Type: OFFICE VISIT  Date: 03/14/2023  Medication: zolpidem (AMBIEN) 5 MG tablet  Has the patient contacted their pharmacy? No, advised to contact office for refill. (Agent: If no, request that the patient contact the pharmacy for the refill. If patient does not wish to contact the pharmacy document the reason why and proceed with request.) (Agent: If yes, when and what did the pharmacy advise?)  Is this the correct pharmacy for this prescription? Yes If no, delete pharmacy and type the correct one.  This is the patient's preferred pharmacy:   [facility] - [place], [place] - [address] [place] [address] Phone: [phone] Fax: [phone]   Has the prescription been filled recently? No  Is the patient out of the medication? Yes  Has the patient been seen for an appointment in the last year OR does the patient have an upcoming appointment? Yes  Can we respond through MyChart? No, prefers phone call.  Agent: Please be advised that Rx refills may take up to 3 business days. We ask that you follow-up with your pharmacy.

## 2023-08-21 DIAGNOSIS — H02831 Dermatochalasis of right upper eyelid: Secondary | ICD-10-CM | POA: Diagnosis not present

## 2023-08-21 DIAGNOSIS — H18413 Arcus senilis, bilateral: Secondary | ICD-10-CM | POA: Diagnosis not present

## 2023-08-21 DIAGNOSIS — H26492 Other secondary cataract, left eye: Secondary | ICD-10-CM | POA: Diagnosis not present

## 2023-08-21 DIAGNOSIS — H26493 Other secondary cataract, bilateral: Secondary | ICD-10-CM | POA: Diagnosis not present

## 2023-08-21 DIAGNOSIS — Z961 Presence of intraocular lens: Secondary | ICD-10-CM | POA: Diagnosis not present

## 2023-08-27 ENCOUNTER — Ambulatory Visit

## 2023-08-29 DIAGNOSIS — Z961 Presence of intraocular lens: Secondary | ICD-10-CM | POA: Diagnosis not present

## 2023-09-03 ENCOUNTER — Ambulatory Visit
Admission: RE | Admit: 2023-09-03 | Discharge: 2023-09-03 | Disposition: A | Discharge status: Home / Self Care | Source: Ambulatory Visit | Attending: Internal Medicine | Admitting: Internal Medicine

## 2023-09-03 DIAGNOSIS — Z1231 Encounter for screening mammogram for malignant neoplasm of breast: Secondary | ICD-10-CM | POA: Diagnosis not present

## 2023-09-04 DIAGNOSIS — H26491 Other secondary cataract, right eye: Secondary | ICD-10-CM | POA: Diagnosis not present

## 2023-09-12 ENCOUNTER — Ambulatory Visit: Payer: Medicare Other | Admitting: Internal Medicine

## 2023-09-12 DIAGNOSIS — Z961 Presence of intraocular lens: Secondary | ICD-10-CM | POA: Diagnosis not present

## 2023-10-06 ENCOUNTER — Encounter (HOSPITAL_COMMUNITY): Payer: Self-pay | Admitting: Emergency Medicine

## 2023-10-06 ENCOUNTER — Emergency Department (HOSPITAL_COMMUNITY)
Admission: EM | Admit: 2023-10-06 | Discharge: 2023-10-06 | Disposition: A | Discharge status: Home / Self Care | Attending: Emergency Medicine | Admitting: Emergency Medicine

## 2023-10-06 DIAGNOSIS — S61422A Laceration with foreign body of left hand, initial encounter: Secondary | ICD-10-CM | POA: Diagnosis not present

## 2023-10-06 DIAGNOSIS — S61217A Laceration without foreign body of left little finger without damage to nail, initial encounter: Secondary | ICD-10-CM | POA: Insufficient documentation

## 2023-10-06 DIAGNOSIS — Z23 Encounter for immunization: Secondary | ICD-10-CM | POA: Insufficient documentation

## 2023-10-06 DIAGNOSIS — Z7982 Long term (current) use of aspirin: Secondary | ICD-10-CM | POA: Insufficient documentation

## 2023-10-06 DIAGNOSIS — S61412A Laceration without foreign body of left hand, initial encounter: Secondary | ICD-10-CM | POA: Diagnosis not present

## 2023-10-06 DIAGNOSIS — Z79899 Other long term (current) drug therapy: Secondary | ICD-10-CM | POA: Diagnosis not present

## 2023-10-06 DIAGNOSIS — W010XXA Fall on same level from slipping, tripping and stumbling without subsequent striking against object, initial encounter: Secondary | ICD-10-CM | POA: Diagnosis not present

## 2023-10-06 DIAGNOSIS — I10 Essential (primary) hypertension: Secondary | ICD-10-CM | POA: Insufficient documentation

## 2023-10-06 MED ORDER — CEPHALEXIN 500 MG PO CAPS
500.0000 mg | ORAL_CAPSULE | Freq: Three times a day (TID) | ORAL | 0 refills | Status: DC
Start: 2023-10-06 — End: 2024-03-14

## 2023-10-06 MED ORDER — CEPHALEXIN 250 MG PO CAPS
500.0000 mg | ORAL_CAPSULE | Freq: Once | ORAL | Status: AC
  Administered 2023-10-06: 500 mg via ORAL
  Filled 2023-10-06: qty 2

## 2023-10-06 MED ORDER — LIDOCAINE HCL 2 % IJ SOLN
10.0000 mL | Freq: Once | INTRAMUSCULAR | Status: AC
  Administered 2023-10-06: 200 mg
  Filled 2023-10-06: qty 20

## 2023-10-06 MED ORDER — TETANUS-DIPHTH-ACELL PERTUSSIS 5-2.5-18.5 LF-MCG/0.5 IM SUSY
0.5000 mL | PREFILLED_SYRINGE | Freq: Once | INTRAMUSCULAR | Status: AC
  Administered 2023-10-06: 0.5 mL via INTRAMUSCULAR
  Filled 2023-10-06: qty 0.5

## 2023-10-06 NOTE — ED Provider Notes (Signed)
 [place] EM[facility] DEPARTMENT AT [facility] Provider Note   CSN: [id number] Arrival date & time: 10/06/23  1853     History  Chief Complaint  Patient presents with   Foreign Body in Skin   Fall    Lauren [name] is a 78 y.o. female hx of HTN, here presenting with foreign body in the right arm.  Patient states that she was holding a glass and that the dog and tripped and fell and the glass broke.  She states that she felt some glass in her left hand.  She also has a laceration of the left fifth finger and it would not stop bleeding.  Patient's tetanus is not up-to-date  The history is provided by the patient.       Home Medications Prior to Admission medications   Medication Sig Start Date End Date Taking? Authorizing Provider  hydrochlorothiazide  (MICROZIDE ) 12.5 MG capsule TAKE 1 CAPSULE BY MOUTH DAILY 03/16/23   Roslyn Coombe, MD  acetaminophen -codeine  (TYLENOL /CODEINE  #3) 300-30 MG tablet Take 1 tablet by mouth every 6 (six) hours as needed. 09/25/22   Roslyn Coombe, MD  ALPRAZolam  (XANAX ) 1 MG tablet TAKE 1/2 TO 1 TABLET BY MOUTH TWICE DAILY AS NEEDED 08/01/23   Roslyn Coombe, MD  amLODipine  (NORVASC ) 5 MG tablet TAKE 1 TABLET BY MOUTH DAILY 06/22/23   Roslyn Coombe, MD  aspirin EC 325 MG tablet Take 325 mg by mouth daily. Patient not taking: Reported on 04/25/2023    [provider]  aspirin EC 81 MG tablet Take 81 mg by mouth daily. Swallow whole.    [provider]  cephALEXin  (KEFLEX ) 500 MG capsule Take 1 capsule (500 mg total) by mouth 3 (three) times daily. 02/23/23   Roslyn Coombe, MD  Cholecalciferol (D3 ADULT PO) Take 1 tablet by mouth daily.    [provider]  citalopram  (CELEXA ) 10 MG tablet TAKE 1 TABLET(10 MG) BY MOUTH DAILY 05/03/23   Roslyn Coombe, MD  ezetimibe  (ZETIA ) 10 MG tablet Take 1 tablet (10 mg total) by mouth daily. 03/06/23   Roslyn Coombe, MD  famotidine  (PEPCID ) 40 MG tablet Take 40 mg by mouth daily. 01/17/22    [provider]  fluticasone  (FLONASE ) 50 MCG/ACT nasal spray Place 1 spray into both nostrils daily. 04/25/23   Dodson Freestone, FNP  ibuprofen  (ADVIL ) 800 MG tablet TAKE 1 TABLET(800 MG) BY MOUTH TWICE DAILY AS NEEDED FOR MODERATE PAIN 02/05/23   Adah Acron, MD  levothyroxine  (SYNTHROID ) 50 MCG tablet TAKE 1 TABLET BY MOUTH DAILY 07/09/23   Roslyn Coombe, MD  pravastatin  (PRAVACHOL ) 20 MG tablet TAKE 1 TABLET BY MOUTH DAILY 04/02/23   Roslyn Coombe, MD  RESTASIS  0.05 % ophthalmic emulsion Place 1 drop into both eyes 2 (two) times daily. 04/18/19   [provider]  zolpidem  (AMBIEN ) 5 MG tablet Take 1 tablet (5 mg total) by mouth at bedtime as needed. for sleep 08/01/23   Roslyn Coombe, MD      Allergies    Celebrex [celecoxib]    Review of Systems   Review of Systems  Skin:  Positive for wound.  All other systems reviewed and are negative.   Physical Exam Updated Vital Signs BP (!) 160/74   Pulse 89   Temp 98.4 F (36.9 C)   Resp 16   Ht 5\' 4"  (1.626 m)   Wt 61 kg   LMP 05/22/1996   SpO2  98%   BMI 23.08 kg/m  Physical Exam Vitals and nursing note reviewed.  Constitutional:      Appearance: Normal appearance.  HENT:     Head: Normocephalic.     Nose: Nose normal.     Mouth/Throat:     Mouth: Mucous membranes are moist.  Eyes:     Pupils: Pupils are equal, round, and reactive to light.  Cardiovascular:     Rate and Rhythm: Normal rate and regular rhythm.     Pulses: Normal pulses.     Heart sounds: Normal heart sounds.  Pulmonary:     Effort: Pulmonary effort is normal.     Breath sounds: Normal breath sounds.  Abdominal:     General: Abdomen is flat.     Palpations: Abdomen is soft.  Musculoskeletal:     Cervical back: Normal range of motion.     Comments: Patient has 3 puncture wounds on the palmar side of the left hand.  Patient also has a 2 cm laceration of the left fifth finger.  Patient able to flex and extend the fingers.  Neurovascular  intact in the left upper extremity  Skin:    Capillary Refill: Capillary refill takes less than 2 seconds.  Neurological:     General: No focal deficit present.     Mental Status: She is alert and oriented to person, place, and time.  Psychiatric:        Mood and Affect: Mood normal.        Behavior: Behavior normal.     ED Results / Procedures / Treatments   Labs (all labs ordered are listed, but only abnormal results are displayed) Labs Reviewed - No data to display  EKG None  Radiology No results found.  Procedures Procedures    LACERATION REPAIR Performed by: Florette Hurry Authorized by: Florette Hurry Consent: Verbal consent obtained. Risks and benefits: risks, benefits and alternatives were discussed Consent given by: patient Patient identity confirmed: provided demographic data Prepped and Draped in normal sterile fashion Wound explored  Laceration Location: L hand   Laceration Length: 2cm  No Foreign Bodies seen or palpated  Anesthesia: local infiltration  Local anesthetic: lidocaine  2% no epinephrine   Anesthetic total: 10 ml  Irrigation method: syringe Amount of cleaning: standard  Skin closure: 5-0 ethilon  Number of sutures: 5  Technique: 5 simple interrupted sutures   Patient tolerance: Patient tolerated the procedure well with no immediate complications.   Medications Ordered in ED Medications  lidocaine  (XYLOCAINE ) 2 % (with pres) injection 200 mg (200 mg Infiltration Given by Other 10/06/23 2021)    ED Course/ Medical Decision Making/ A&P                                 Medical Decision Making Lauren [name] is a 78 y.o. female here presenting with fall with glass embedded in the left hand.  I was able to explore the wound under anesthesia and was able to remove the embedded glass.  Patient also has a laceration of the left fifth finger was able to suture it.  Tdap is updated.  Patient will be discharged home with a course of Keflex  to  prevent infection   Problems Addressed: Laceration of left hand with foreign body, initial encounter: acute illness or injury  Risk Prescription drug management.    Final Clinical Impression(s) / ED Diagnoses Final diagnoses:  None  Rx / DC Orders ED Discharge Orders     None         Dalene Duck, MD 10/06/23 2051

## 2023-10-06 NOTE — Discharge Instructions (Addendum)
 As we discussed, you have glass in the left hand that was removed.  You also have sutures placed and that need to be removed in 7 to 10 days  Take Keflex  3 times daily for a week to prevent infection  See your doctor for follow-up  Return to ER if you have severe pain or purulent discharge or fever

## 2023-10-06 NOTE — ED Triage Notes (Signed)
 Pt tripped over dog and fell on a glass. Pt has glass in left pinky. Bruise noted to chin. Denies blood thinners or LOC.

## 2023-10-13 ENCOUNTER — Encounter (HOSPITAL_COMMUNITY): Payer: Self-pay

## 2023-10-13 ENCOUNTER — Ambulatory Visit (HOSPITAL_COMMUNITY)
Admission: RE | Admit: 2023-10-13 | Discharge: 2023-10-13 | Disposition: A | Discharge status: Home / Self Care | Source: Ambulatory Visit | Attending: Internal Medicine | Admitting: Internal Medicine

## 2023-10-13 VITALS — BP 130/60 | HR 80 | Temp 97.7°F | Resp 16

## 2023-10-13 DIAGNOSIS — Z4802 Encounter for removal of sutures: Secondary | ICD-10-CM | POA: Diagnosis not present

## 2023-10-13 NOTE — ED Provider Notes (Signed)
 [facility]  Note:  This document was prepared using Dragon voice recognition software and may include unintentional dictation errors.  MRN: [id number] DOB: 30-Jul-1945  Subjective:   Lauren [name] is a 78 y.o. female presenting for suture removal from left hand.  Patient has 5 sutures placed from lacerations to left hand approximately 10 days ago.  Patient reports that 1 suture fell out on its own a few days back.  Patient denies any pain or purulent drainage from wounds.  Currently taking antibiotics as prescribed by ER.  No secondary concerns of infection or other medical concerns.  No current facility-administered medications for this encounter.  Current Outpatient Medications:    ALPRAZolam  (XANAX ) 1 MG tablet, TAKE 1/2 TO 1 TABLET BY MOUTH TWICE DAILY AS NEEDED, Disp: 60 tablet, Rfl: 2   aspirin EC 81 MG tablet, Take 81 mg by mouth daily. Swallow whole., Disp: , Rfl:    cephALEXin  (KEFLEX ) 500 MG capsule, Take 1 capsule (500 mg total) by mouth 3 (three) times daily., Disp: 21 capsule, Rfl: 0   citalopram  (CELEXA ) 10 MG tablet, TAKE 1 TABLET(10 MG) BY MOUTH DAILY, Disp: 90 tablet, Rfl: 3   ezetimibe  (ZETIA ) 10 MG tablet, Take 1 tablet (10 mg total) by mouth daily., Disp: 90 tablet, Rfl: 3   famotidine  (PEPCID ) 40 MG tablet, Take 40 mg by mouth daily., Disp: , Rfl:    hydrochlorothiazide  (MICROZIDE ) 12.5 MG capsule, TAKE 1 CAPSULE BY MOUTH DAILY, Disp: 90 capsule, Rfl: 3   ibuprofen  (ADVIL ) 800 MG tablet, TAKE 1 TABLET(800 MG) BY MOUTH TWICE DAILY AS NEEDED FOR MODERATE PAIN, Disp: 60 tablet, Rfl: 4   levothyroxine  (SYNTHROID ) 50 MCG tablet, TAKE 1 TABLET BY MOUTH DAILY, Disp: 100 tablet, Rfl: 2   RESTASIS  0.05 % ophthalmic emulsion, Place 1 drop into both eyes 2 (two) times daily., Disp: , Rfl:    zolpidem  (AMBIEN ) 5 MG tablet, Take 1 tablet (5 mg total) by mouth at bedtime as needed. for sleep, Disp: 90 tablet, Rfl: 1   Allergies  Allergen Reactions   Celebrex  [Celecoxib] Itching and Rash    Past Medical History:  Diagnosis Date   Anxiety    Arthritis    Carotid stenosis 10/08/2013   Cervical disc disease    Depression    Diverticulitis    Diverticulosis    Frequent headaches    Hypertension    does not now per pt.   Hypothyroidism 10/26/2015   Impaired glucose tolerance 10/08/2013   normal HgA1C   Neck pain    Psoriasis      Past Surgical History:  Procedure Laterality Date   BREAST BIOPSY Left 2011   benign    CERVICAL FUSION     approx 1980   CHOLECYSTECTOMY     COLONOSCOPY     I & D EXTREMITY Right 03/07/2022   Procedure: IRRIGATION AND DEBRIDEMENT OF FOOT WITH BONE BIOPSY;  Surgeon: Charity Conch, DPM;  Location: [facility] OR;  Service: Podiatry;  Laterality: Right;   POSTERIOR CERVICAL FUSION/FORAMINOTOMY N/A 11/05/2014   Procedure: Cervical four to Cervical seven  Posterior Cervical Fusion with lateral mass fixation;  Surgeon: Isadora Mar, MD;  Location: [facility];  Service: Neurosurgery;  Laterality: N/A;   C4 - C7 Posterior Cervical Fusion with lateral mass fixation   TOTAL ABDOMINAL HYSTERECTOMY  1998   Fibroid   UPPER GASTROINTESTINAL ENDOSCOPY      Family History  Problem Relation Age of Onset   Heart disease  Mother    Diabetes Mother    Cervical cancer Mother    Heart attack Mother    Lung cancer Father    Alcoholism Father    Colon cancer Father    Diabetes Brother    Throat cancer Brother    Cancer Other        lung cancer   Heart disease Other    Hypertension Other    Arthritis Other    Alcohol abuse Other    Rectal cancer Neg Hx    Stomach cancer Neg Hx     Social History   Tobacco Use   Smoking status: Never    Passive exposure: Never   Smokeless tobacco: Never  Vaping Use   Vaping status: Never Used  Substance Use Topics   Alcohol use: Not Currently   Drug use: No    ROS Refer to HPI for ROS details.  Objective:   Vitals: BP 130/60 (BP Location: Left Arm)   Pulse 80   Temp  97.7 F (36.5 C) (Oral)   Resp 16   LMP 05/22/1996   SpO2 93%   Physical Exam Vitals and nursing note reviewed.  Constitutional:      General: She is not in acute distress.    Appearance: Normal appearance. She is not ill-appearing or toxic-appearing.  HENT:     Head: Normocephalic.  Cardiovascular:     Rate and Rhythm: Normal rate.  Pulmonary:     Effort: Pulmonary effort is normal. No respiratory distress.  Skin:    General: Skin is warm and dry.     Capillary Refill: Capillary refill takes less than 2 seconds.     Findings: Wound (5 sutures were from left hand from 3 separate wounds, no secondary signs of infection, no redness, no warmth, no drainage, no pain.) present.  Neurological:     General: No focal deficit present.     Mental Status: She is alert and oriented to person, place, and time.  Psychiatric:        Mood and Affect: Mood normal.        Behavior: Behavior normal.     Procedures  No results found for this or any previous visit (from the past 24 hours).  No results found.   Assessment and Plan :     Discharge Instructions       1. Visit for suture removal (Primary) - 5 sutures removed from left hand, wounds well-approximated, no signs of secondary infection.  - Patient advised to apply antibiotic ointment at home to wounds to prevent any secondary infection and to aid in healing.    Nechemia Chiappetta B Tifanny Dollens   Meloni Hinz B, [place] 10/13/23 1156

## 2023-10-13 NOTE — Discharge Instructions (Signed)
  1. Visit for suture removal (Primary) - 5 sutures removed from left hand, wounds well-approximated, no signs of secondary infection.  - Patient advised to apply antibiotic ointment at home to wounds to prevent any secondary infection and to aid in healing.

## 2023-10-13 NOTE — ED Triage Notes (Signed)
 Patient presenting for suture removal. Sutures placed in the emergency room. Still taking the antibiotic. Denies any issues with the wound other than slight itching.

## 2023-10-17 ENCOUNTER — Other Ambulatory Visit: Payer: Self-pay

## 2023-10-17 ENCOUNTER — Encounter (HOSPITAL_COMMUNITY): Payer: Self-pay | Admitting: Emergency Medicine

## 2023-10-17 ENCOUNTER — Emergency Department (HOSPITAL_COMMUNITY)
Admission: EM | Admit: 2023-10-17 | Discharge: 2023-10-17 | Disposition: A | Discharge status: Home / Self Care | Attending: Emergency Medicine | Admitting: Emergency Medicine

## 2023-10-17 ENCOUNTER — Emergency Department (HOSPITAL_COMMUNITY)

## 2023-10-17 DIAGNOSIS — Z7982 Long term (current) use of aspirin: Secondary | ICD-10-CM | POA: Insufficient documentation

## 2023-10-17 DIAGNOSIS — S61412A Laceration without foreign body of left hand, initial encounter: Secondary | ICD-10-CM | POA: Diagnosis not present

## 2023-10-17 DIAGNOSIS — M79642 Pain in left hand: Secondary | ICD-10-CM | POA: Diagnosis not present

## 2023-10-17 MED ORDER — ACETAMINOPHEN 325 MG PO TABS: 650.0000 mg | ORAL_TABLET | Freq: Once | ORAL | Status: DC

## 2023-10-17 NOTE — ED Triage Notes (Signed)
 Patient had glass in left hand and needed sutures on 5/17.  Patient reports their is still glass in her hand and her pinky is infected.  Patient is still taking abx, was seen at UC 3 days ago for suture removal. Patient gives verbal consent for MSE.

## 2023-10-17 NOTE — ED Provider Triage Note (Signed)
 Emergency Medicine Provider Triage Evaluation Note  Lauren [name] , a 78 y.o. female  was evaluated in triage.  Pt complains of pain and glass in the wounds, feels like she has an infection.  Had wound repair with glass removal 11 days ago had sutures removed 3 days ago, states she still cannot see and feel glass in the wounds, and has some swelling to the little finger and thinks that this is infected..  Review of Systems  Positive: Swelling, redness Negative:   Physical Exam  BP (!) 139/59 (BP Location: Right Arm)   Pulse 80   Temp 98.4 F (36.9 C)   Resp 18   Wt 62.1 kg   LMP 05/22/1996   SpO2 98%   BMI 23.52 kg/m  Gen:   Awake, no distress   Resp:  Normal effort  MSK:   Moves extremities without difficulty  Other:    Medical Decision Making  Medically screening exam initiated at 2:18 PM.  Appropriate orders placed.  YAKIMA KREITZER was informed that the remainder of the evaluation will be completed by another provider, this initial triage assessment does not replace that evaluation, and the importance of remaining in the ED until their evaluation is complete.     Aimee Houseman, [place] 10/17/23 1424

## 2023-10-17 NOTE — Discharge Instructions (Signed)
 Please follow-up with your primary care provider in regards to recent symptoms and ER visit.  Today your imaging was negative and you feel exam is ultimately reassuring.  Please take Tylenol  every 6 hours needed for pain along with ice.  If symptoms change or worsen please return to the ER.

## 2023-10-17 NOTE — ED Notes (Signed)
 Saw patient walking down the hall, she says she "is going home and not waiting anymore, they said there is nothing they can do" I asked if she wanted to wait for d/c instructions, she said "no"

## 2023-10-17 NOTE — ED Provider Notes (Signed)
  EMERGENCY DEPARTMENT AT [facility] Provider Note   CSN: [id number] Arrival date & time: 10/17/23  1349     History  Chief Complaint  Patient presents with   Foreign Body    Lauren [name] is a 78 y.o. female presented for concerns of an infection.  Patient had a laceration earlier this month that was repaired but glass was still present so she was given Keflex  and has a few days left.  Patient believes she has an infection at that site as she has some pain and believes this is an infection.  Patient denies any paresthesias, weakness, fevers, swollen finger, redness, purulent material.  Patient the sutures removed by the urgent care and at the time there was no concern.  Patient has not taken any pain meds at this time.   Home Medications Prior to Admission medications   Medication Sig Start Date End Date Taking? Authorizing Provider  ALPRAZolam  (XANAX ) 1 MG tablet TAKE 1/2 TO 1 TABLET BY MOUTH TWICE DAILY AS NEEDED 08/01/23   Roslyn Coombe, MD  aspirin EC 81 MG tablet Take 81 mg by mouth daily. Swallow whole.    [provider]  cephALEXin  (KEFLEX ) 500 MG capsule Take 1 capsule (500 mg total) by mouth 3 (three) times daily. 10/06/23   Dalene Duck, MD  citalopram  (CELEXA ) 10 MG tablet TAKE 1 TABLET(10 MG) BY MOUTH DAILY 05/03/23   Roslyn Coombe, MD  ezetimibe  (ZETIA ) 10 MG tablet Take 1 tablet (10 mg total) by mouth daily. 03/06/23   Roslyn Coombe, MD  famotidine  (PEPCID ) 40 MG tablet Take 40 mg by mouth daily. 01/17/22   [provider]  hydrochlorothiazide  (MICROZIDE ) 12.5 MG capsule TAKE 1 CAPSULE BY MOUTH DAILY 03/16/23   Roslyn Coombe, MD  ibuprofen  (ADVIL ) 800 MG tablet TAKE 1 TABLET(800 MG) BY MOUTH TWICE DAILY AS NEEDED FOR MODERATE PAIN 02/05/23   Adah Acron, MD  levothyroxine  (SYNTHROID ) 50 MCG tablet TAKE 1 TABLET BY MOUTH DAILY 07/09/23   Roslyn Coombe, MD  RESTASIS  0.05 % ophthalmic emulsion Place 1 drop into both eyes 2 (two)  times daily. 04/18/19   [provider]  zolpidem  (AMBIEN ) 5 MG tablet Take 1 tablet (5 mg total) by mouth at bedtime as needed. for sleep 08/01/23   Roslyn Coombe, MD      Allergies    Celebrex [celecoxib]    Review of Systems   Review of Systems  Physical Exam Updated Vital Signs BP (!) 163/68   Pulse 75   Temp 98.3 F (36.8 C) (Oral)   Resp 16   Wt 62.1 kg   LMP 05/22/1996   SpO2 100%   BMI 23.52 kg/m  Physical Exam Vitals reviewed.  Constitutional:      General: She is not in acute distress. Cardiovascular:     Rate and Rhythm: Normal rate.     Pulses: Normal pulses.  Musculoskeletal:     Comments: 5-5 grip strength, 5 5 fifth digit flexion at DIP and PIP, no dactylitis noted, no flexor tenosynovitis noted, no erythema edema or warmth purulent material noted areas of fluctuance, laceration appears to be healing well with healthy granulation tissue, able to passively range and extend fingers in the left hand without pain, finger is not held in flexion Pain not out of proportion Soft compartments  Skin:    General: Skin is warm and dry.     Capillary Refill: Capillary refill takes less than  2 seconds.  Neurological:     Mental Status: She is alert.     Comments: Sensation intact distally  Psychiatric:        Mood and Affect: Mood normal.     ED Results / Procedures / Treatments   Labs (all labs ordered are listed, but only abnormal results are displayed) Labs Reviewed - No data to display  EKG None  Radiology DG Hand 2 View Left Result Date: 10/17/2023 CLINICAL DATA:  Patient receives sutures for laceration from glass in the left hand. Concern for retained foreign body. EXAM: LEFT HAND - 2 VIEW COMPARISON:  None Available. FINDINGS: No fracture.  No bone lesion.  Joints are normally aligned. No radiopaque foreign body. No obvious soft tissue swelling and no soft tissue air. IMPRESSION: Negative. Electronically Signed   By: Amanda Jungling M.D.   On:  10/17/2023 15:53    Procedures Procedures    Medications Ordered in ED Medications  acetaminophen  (TYLENOL ) tablet 650 mg (has no administration in time range)    ED Course/ Medical Decision Making/ A&P                                 Medical Decision Making  Lauren [name] 78 y.o. presented today for left hand pain. Working DDx that I considered at this time includes, but not limited to, flexor tenosynovitis, contusion, strain/sprain, fracture, dislocation, neurovascular compromise, septic joint, ischemic limb, compartment syndrome.  R/o DDx: flexor tenosynovitis, contusion, strain/sprain, fracture, dislocation, neurovascular compromise, septic joint, ischemic limb, compartment syndrome: These are considered less likely due to history of present illness, physical exam, labs/imaging findings.  Review of prior external notes: None  Unique Tests and My Independent Interpretation:  Left hand x-ray: Unremarkable  Social Determinants of Health: none  Discussion with Independent Historian: None  Discussion of Management of Tests: None  Risk: Medium: prescription drug management  Risk Stratification Score: None  Staffed with Messick, MD  Plan: On exam patient was no acute distress with stable vitals.  Patient is neuro vas intact and does not have any concerning features for flexor tenosynovitis, cellulitis, abscess.  Patient's laceration site appears to be healing well with healthy granulation tissue and patient endorsing infectious symptoms.  X-ray was negative for any foreign bodies and I encouraged patient to continue taking her antibiotics and follow-up with her primary care provider.  I did offer hand referral however patient adamantly denied.  Will give Tylenol  here.  Patient was given return precautions. Patient stable for discharge at this time.  Patient verbalized understanding of plan.  This chart was dictated using voice recognition software.  Despite best efforts  to proofread,  errors can occur which can change the documentation meaning.         Final Clinical Impression(s) / ED Diagnoses Final diagnoses:  Hand pain, left    Rx / DC Orders ED Discharge Orders     None         Elex Grimmer 10/17/23 1919    Burnette Carte, MD 10/17/23 2223

## 2023-11-07 ENCOUNTER — Other Ambulatory Visit: Payer: Self-pay | Admitting: Internal Medicine

## 2023-11-07 ENCOUNTER — Encounter: Payer: Self-pay | Admitting: Internal Medicine

## 2023-11-08 MED ORDER — ZOLPIDEM TARTRATE 5 MG PO TABS: 5.0000 mg | ORAL_TABLET | Freq: Every evening | ORAL | 1 refills | Status: DC | PRN

## 2023-11-27 ENCOUNTER — Other Ambulatory Visit: Payer: Self-pay | Admitting: Internal Medicine

## 2023-12-05 ENCOUNTER — Other Ambulatory Visit: Payer: Self-pay | Admitting: Internal Medicine

## 2024-01-07 ENCOUNTER — Ambulatory Visit (INDEPENDENT_AMBULATORY_CARE_PROVIDER_SITE_OTHER): Payer: Medicare Other

## 2024-01-07 DIAGNOSIS — Z0000 Encounter for general adult medical examination without abnormal findings: Secondary | ICD-10-CM

## 2024-01-07 DIAGNOSIS — Z780 Asymptomatic menopausal state: Secondary | ICD-10-CM

## 2024-01-07 NOTE — Progress Notes (Signed)
 Subjective:   Lauren [name] is a 78 y.o. who presents for a Medicare Wellness preventive visit.  As a reminder, Annual Wellness Visits don't include a physical exam, and some assessments may be limited, especially if this visit is performed virtually. We may recommend an in-person follow-up visit with your provider if needed.  Visit Complete: Virtual I connected with  Lauren [name] on 01/07/24 by a audio enabled telemedicine application and verified that I am speaking with the correct person using two identifiers.  Patient Location: Home  Provider Location: Home Office  I discussed the limitations of evaluation and management by telemedicine. The patient expressed understanding and agreed to proceed.  Vital Signs: Because this visit was a virtual/telehealth visit, some criteria may be missing or patient reported. Any vitals not documented were not able to be obtained and vitals that have been documented are patient reported.  VideoDeclined- This patient declined Librarian, academic. Therefore the visit was completed with audio only.  Persons Participating in Visit: Patient.  AWV Questionnaire: No: Patient Medicare AWV questionnaire was not completed prior to this visit.  Cardiac Risk Factors include: advanced age (>74men, >77 women);dyslipidemia;hypertension     Objective:    There were no vitals filed for this visit. There is no height or weight on file to calculate BMI.     01/07/2024    8:25 AM 10/17/2023    2:15 PM 01/04/2023    9:34 AM 03/06/2022    7:43 PM 10/22/2021    1:02 PM 05/25/2021    9:39 AM 04/09/2020    2:40 PM  Advanced Directives  Does Patient Have a Medical Advance Directive? Yes No Yes No No;Yes Yes Yes  Type of Estate agent of [place];Living will  Living will;Healthcare Power of Attorney  Out of facility DNR (pink MOST or yellow form)  Living will;Out of facility DNR (pink MOST or yellow form)  Does  patient want to make changes to medical advance directive? No - Patient declined    No - Patient declined No - Patient declined No - Patient declined  Copy of Healthcare Power of Attorney in Chart? Yes - validated most recent copy scanned in chart (See row information)  No - copy requested      Would patient like information on creating a medical advance directive?    No - Patient declined No - Patient declined      Current Medications (verified) Outpatient Encounter Medications as of 01/07/2024  Medication Sig   ALPRAZolam  (XANAX ) 1 MG tablet TAKE 1/2 TO 1 TABLET BY MOUTH TWICE DAILY AS NEEDED   aspirin EC 81 MG tablet Take 81 mg by mouth daily. Swallow whole.   cholecalciferol (VITAMIN D3) 25 MCG (1000 UNIT) tablet Take 1,000 Units by mouth daily.   ezetimibe  (ZETIA ) 10 MG tablet TAKE 1 TABLET(10 MG) BY MOUTH DAILY   famotidine  (PEPCID ) 40 MG tablet Take 40 mg by mouth daily.   hydrochlorothiazide  (MICROZIDE ) 12.5 MG capsule TAKE 1 CAPSULE BY MOUTH DAILY   levothyroxine  (SYNTHROID ) 50 MCG tablet TAKE 1 TABLET BY MOUTH DAILY   RESTASIS  0.05 % ophthalmic emulsion Place 1 drop into both eyes 2 (two) times daily.   vitamin B-12 (CYANOCOBALAMIN ) 500 MCG tablet Take 500 mcg by mouth daily.   zolpidem  (AMBIEN ) 5 MG tablet Take 1 tablet (5 mg total) by mouth at bedtime as needed. for sleep   cephALEXin  (KEFLEX ) 500 MG capsule Take 1 capsule (500 mg total) by mouth 3 (  three) times daily. (Patient not taking: Reported on 01/07/2024)   citalopram  (CELEXA ) 10 MG tablet TAKE 1 TABLET(10 MG) BY MOUTH DAILY (Patient not taking: Reported on 01/07/2024)   ibuprofen  (ADVIL ) 800 MG tablet TAKE 1 TABLET(800 MG) BY MOUTH TWICE DAILY AS NEEDED FOR MODERATE PAIN (Patient not taking: Reported on 01/07/2024)   No facility-administered encounter medications on file as of 01/07/2024.    Allergies (verified) Celebrex [celecoxib]   History: Past Medical History:  Diagnosis Date   Anxiety    Arthritis    Carotid  stenosis 10/08/2013   Cervical disc disease    Depression    Diverticulitis    Diverticulosis    Frequent headaches    Hypertension    does not now per pt.   Hypothyroidism 10/26/2015   Impaired glucose tolerance 10/08/2013   normal HgA1C   Neck pain    Psoriasis    Past Surgical History:  Procedure Laterality Date   BREAST BIOPSY Left 2011   benign    CERVICAL FUSION     approx 1980   CHOLECYSTECTOMY     COLONOSCOPY     I & D EXTREMITY Right 03/07/2022   Procedure: IRRIGATION AND DEBRIDEMENT OF FOOT WITH BONE BIOPSY;  Surgeon: Gershon Donnice SAUNDERS, DPM;  Location: [facility] OR;  Service: Podiatry;  Laterality: Right;   POSTERIOR CERVICAL FUSION/FORAMINOTOMY N/A 11/05/2014   Procedure: Cervical four to Cervical seven  Posterior Cervical Fusion with lateral mass fixation;  Surgeon: Alm GORMAN Molt, MD;  Location: [facility];  Service: Neurosurgery;  Laterality: N/A;   C4 - C7 Posterior Cervical Fusion with lateral mass fixation   TOTAL ABDOMINAL HYSTERECTOMY  1998   Fibroid   UPPER GASTROINTESTINAL ENDOSCOPY     Family History  Problem Relation Age of Onset   Heart disease Mother    Diabetes Mother    Cervical cancer Mother    Heart attack Mother    Lung cancer Father    Alcoholism Father    Colon cancer Father    Diabetes Brother    Throat cancer Brother    Cancer Other        lung cancer   Heart disease Other    Hypertension Other    Arthritis Other    Alcohol abuse Other    Rectal cancer Neg Hx    Stomach cancer Neg Hx    Social History   Socioeconomic History   Marital status: Married    Spouse name: Marinell   Number of children: 2   Years of education: 12   Highest education level: Not on file  Occupational History   Occupation: retired  Tobacco Use   Smoking status: Never    Passive exposure: Never   Smokeless tobacco: Never  Vaping Use   Vaping status: Never Used  Substance and Sexual Activity   Alcohol use: Not Currently   Drug use: No   Sexual activity: Yes     Partners: Male    Birth control/protection: Surgical    Comment: Hyst  Other Topics Concern   Not on file  Social History Narrative   Lives at home with husband and son. And has a dog/2025   Right-handed.   2 cups caffeine  daily.   Social Drivers of Health   Financial Resource Strain: Low Risk  (01/07/2024)   Overall Financial Resource Strain (CARDIA)    Difficulty of Paying Living Expenses: Not very hard  Food Insecurity: No Food Insecurity (01/07/2024)   Hunger Vital Sign  Worried About Programme researcher, broadcasting/film/video in the Last Year: Never true    Ran Out of Food in the Last Year: Never true  Transportation Needs: No Transportation Needs (01/07/2024)   PRAPARE - Administrator, Civil Service (Medical): No    Lack of Transportation (Non-Medical): No  Physical Activity: Insufficiently Active (01/07/2024)   Exercise Vital Sign    Days of Exercise per Week: 2 days    Minutes of Exercise per Session: 40 min  Stress: No Stress Concern Present (01/07/2024)   [employer] of Occupational Health - Occupational Stress Questionnaire    Feeling of Stress: Not at all  Social Connections: Moderately Isolated (01/07/2024)   Social Connection and Isolation Panel    Frequency of Communication with Friends and Family: More than three times a week    Frequency of Social Gatherings with Friends and Family: Not on file    Attends Religious Services: Never    Database administrator or Organizations: No    Attends Banker Meetings: Never    Marital Status: Married    Tobacco Counseling Counseling given: Not Answered    Clinical Intake:  Pre-visit preparation completed: Yes  Pain : No/denies pain     Nutritional Risks: None  Lab Results  Component Value Date   HGBA1C 5.8 09/07/2022   HGBA1C 5.8 03/21/2022   HGBA1C 5.9 04/26/2021     How often do you need to have someone help you when you read instructions, pamphlets, or other written materials from your  doctor or pharmacy?: 1 - Never  Interpreter Needed?: No  Information entered by :: Lauren [name], RMA   Activities of Daily Living     01/07/2024    8:16 AM  In your present state of health, do you have any difficulty performing the following activities:  Hearing? 0  Vision? 0  Difficulty concentrating or making decisions? 0  Walking or climbing stairs? 0  Dressing or bathing? 0  Doing errands, shopping? 0  Preparing Food and eating ? N  Using the Toilet? N  In the past six months, have you accidently leaked urine? N  Do you have problems with loss of bowel control? N  Managing your Medications? N  Managing your Finances? N  Housekeeping or managing your Housekeeping? N    Patient Care Team: Lauren [name] ORN, MD as PCP - General (Internal Medicine) Mindi Mt, MD (Inactive) as Consulting Physician (Anesthesiology) [facility], [place] as Consulting Physician (Optometry)  I have updated your Care Teams any recent Medical Services you may have received from other providers in the past year.     Assessment:   This is a routine wellness examination for Lauren [name].  Hearing/Vision screen Hearing Screening - Comments:: Denies hearing difficulties   Vision Screening - Comments:: Wears eyeglasses/[facility]   Goals Addressed             This Visit's Progress    Patient Stated   On track    Keep being healthy and trusting in God.       Depression Screen     01/07/2024    8:32 AM 03/14/2023   10:36 AM 02/23/2023    8:15 AM 01/04/2023    9:36 AM 09/07/2022    9:00 AM 08/29/2022    8:40 AM 06/13/2022    9:06 AM  PHQ 2/9 Scores  PHQ - 2 Score 1 0 0 0 0 0 0  PHQ- 9 Score 3 1 4  0 4  0    Fall Risk     01/07/2024    8:26 AM 03/14/2023   10:36 AM 02/23/2023    8:15 AM 01/04/2023    9:35 AM 09/07/2022    8:59 AM  Fall Risk   Falls in the past year? 1 1 1 1  0  Number falls in past yr: 0 1 1 0 0  Injury with Fall? 1 1 0 0 0  Risk for fall due to : Impaired  balance/gait History of fall(s) History of fall(s) No Fall Risks History of fall(s)  Follow up Falls evaluation completed;Falls prevention discussed Falls evaluation completed Falls evaluation completed Falls evaluation completed Falls evaluation completed    MEDICARE RISK AT HOME:  Medicare Risk at Home Any stairs in or around the home?: Yes If so, are there any without handrails?: No Home free of loose throw rugs in walkways, pet beds, electrical cords, etc?: Yes Adequate lighting in your home to reduce risk of falls?: Yes Life alert?: No Use of a cane, walker or w/c?: No Grab bars in the bathroom?: Yes Shower chair or bench in shower?: No Elevated toilet seat or a handicapped toilet?: Yes  TIMED UP AND GO:  Was the test performed?  No  Cognitive Function: Declined/Normal: No cognitive concerns noted by patient or family. Patient alert, oriented, able to answer questions appropriately and recall recent events. No signs of memory loss or confusion.        01/04/2023    9:36 AM  6CIT Screen  What Year? 0 points  What month? 0 points  What time? 0 points  Count back from 20 0 points  Months in reverse 0 points  Repeat phrase 0 points  Total Score 0 points    Immunizations Immunization History  Administered Date(s) Administered   Fluad Quad(high Dose 65+) 02/07/2019   Fluad Trivalent(High Dose 65+) 02/23/2023   Influenza, High Dose Seasonal PF 03/27/2018, 02/26/2020   Influenza-Unspecified 02/20/2015, 02/26/2016, 03/22/2018, 03/14/2021   PFIZER Comirnaty(Gray Top)Covid-19 Tri-Sucrose Vaccine 08/23/2020   PFIZER(Purple Top)SARS-COV-2 Vaccination 06/28/2019, 07/22/2019, 02/16/2020, 02/28/2021   Pfizer Covid-19 Vaccine Bivalent Booster 63yrs & up 03/17/2021   Pneumococcal Conjugate-13 10/08/2013   Pneumococcal Polysaccharide-23 10/14/2014   Tdap 05/22/2016, 10/06/2023   Zoster Recombinant(Shingrix) 07/04/2017, 10/31/2017   Zoster, Live 10/15/2014    Screening  Tests Health Maintenance  Topic Date Due   COVID-19 Vaccine (7 - 2024-25 season) 01/21/2023   Medicare Annual Wellness (AWV)  01/04/2024   INFLUENZA VACCINE  12/21/2023   MAMMOGRAM  09/02/2024   DTaP/Tdap/Td (3 - Td or Tdap) 10/05/2033   Pneumococcal Vaccine: 50+ Years  Completed   DEXA SCAN  Completed   Hepatitis C Screening  Completed   Zoster Vaccines- Shingrix  Completed   HPV VACCINES  Aged Out   Meningococcal B Vaccine  Aged Out   Colonoscopy  Discontinued    Health Maintenance  Health Maintenance Due  Topic Date Due   COVID-19 Vaccine (7 - 2024-25 season) 01/21/2023   Medicare Annual Wellness (AWV)  01/04/2024   INFLUENZA VACCINE  12/21/2023   Health Maintenance Items Addressed: DEXA ordered, See Nurse Notes at the end of this note  Additional Screening:  Vision Screening: Recommended annual ophthalmology exams for early detection of glaucoma and other disorders of the eye. Would you like a referral to an eye doctor? No    Dental Screening: Recommended annual dental exams for proper oral hygiene  Community Resource Referral / Chronic Care Management: CRR required this  visit?  No   CCM required this visit?  No   Plan:    I have personally reviewed and noted the following in the patient's chart:   Medical and social history Use of alcohol, tobacco or illicit drugs  Current medications and supplements including opioid prescriptions. Patient is not currently taking opioid prescriptions. Functional ability and status Nutritional status Physical activity Advanced directives List of other physicians Hospitalizations, surgeries, and ER visits in previous 12 months Vitals Screenings to include cognitive, depression, and falls Referrals and appointments  In addition, I have reviewed and discussed with patient certain preventive protocols, quality metrics, and best practice recommendations. A written personalized care plan for preventive services as well as  general preventive health recommendations were provided to patient.   Lauren [name], CMA   01/07/2024   After Visit Summary: (MyChart) Due to this being a telephonic visit, the after visit summary with patients personalized plan was offered to patient via MyChart   Notes: Patient is due for a DEXA and order has been placed today.  She had no other concerns to address today.

## 2024-01-07 NOTE — Patient Instructions (Signed)
 Ms. Vetsch , Thank you for taking time out of your busy schedule to complete your Annual Wellness Visit with me. I enjoyed our conversation and look forward to speaking with you again next year. I, as well as your care team,  appreciate your ongoing commitment to your health goals. Please review the following plan we discussed and let me know if I can assist you in the future. Your Game plan/ To Do List    Referrals: If you haven't heard from the office you've been referred to, please reach out to them at the phone provided.  You have an order for:  [x]   Bone Density     Please call for appointment:  [facility] [address] [place], [place] [address] [phone]  Make sure to wear two-piece clothing.  No lotions, powders, or deodorants the day of the appointment. Make sure to bring picture ID and insurance card.  Bring list of medications you are currently taking including any supplements.    Follow up Visits: We will see or speak with you next year for your Next Medicare AWV with our clinical staff Have you seen your provider in the last 6 months (3 months if uncontrolled diabetes)? No.  Patient's next ov on 03/14/2024.  Clinician Recommendations:  Aim for 30 minutes of exercise or brisk walking, 6-8 glasses of water , and 5 servings of fruits and vegetables each day. Please let Dr. Nicola nurse know that you are taking Dose Immunity, as I could not add that to your medication list.  She may can add it for you.        This is a list of the screenings recommended for you:  Health Maintenance  Topic Date Due   COVID-19 Vaccine (7 - 2024-25 season) 01/21/2023   Medicare Annual Wellness Visit  01/04/2024   Flu Shot  12/21/2023   Mammogram  09/02/2024   DTaP/Tdap/Td vaccine (3 - Td or Tdap) 10/05/2033   Pneumococcal Vaccine for age over 28  Completed   DEXA scan (bone density measurement)  Completed   Hepatitis C Screening  Completed   Zoster (Shingles)  Vaccine  Completed   HPV Vaccine  Aged Out   Meningitis B Vaccine  Aged Out   Colon Cancer Screening  Discontinued    Advanced directives: (Copy Requested) Please bring a copy of your health care power of attorney and living will to the office to be added to your chart at your convenience. You can mail to [facility] [address] [place], [place] [address] or email to [email or web address] Advance Care Planning is important because it:  [x]  Makes sure you receive the medical care that is consistent with your values, goals, and preferences  [x]  It provides guidance to your family and loved ones and reduces their decisional burden about whether or not they are making the right decisions based on your wishes.  Follow the link provided in your after visit summary or read over the paperwork we have mailed to you to help you started getting your Advance Directives in place. If you need assistance in completing these, please reach out to us  so that we can help you!  See attachments for Preventive Care and Fall Prevention Tips.

## 2024-02-12 ENCOUNTER — Encounter: Payer: Self-pay | Admitting: Internal Medicine

## 2024-02-14 MED ORDER — ZOLPIDEM TARTRATE 5 MG PO TABS: 5.0000 mg | ORAL_TABLET | Freq: Every evening | ORAL | 1 refills | Status: AC | PRN

## 2024-02-19 ENCOUNTER — Ambulatory Visit: Payer: Self-pay

## 2024-02-19 NOTE — Telephone Encounter (Signed)
 FYI Only or Action Required?: FYI only for provider.  Patient was last seen in primary care on 03/14/2023 by Norleen Lynwood ORN, MD.  Called Nurse Triage reporting Nasal Congestion.and chest congestion  Symptoms began about a month ago.  Interventions attempted: OTC medications: Several OTCm medications without relieft.  Symptoms are: unchanged.  Triage Disposition: See PCP When Office is Open (Within 3 Days)  Patient/caregiver understands and will follow disposition?: Yes  Reason for Disposition  [1] Sinus congestion (pressure, fullness) AND [2] present > 10 days  Answer Assessment - Initial Assessment Questions 1. LOCATION: Where does it hurt?      I have nasal congestion and chest congestion.   I've had it for 3-4 weeks and it won't go away.   No fever.    I have a cough with drainage down the back of my throat causing me to cough.   Nothing is coming up.   I'm use saline solution, Mucinex, Vicks, Nyquil.   Nothing is helping me. 2. ONSET: When did the sinus pain start?  (e.g., hours, days)      3-4 weeks 3. SEVERITY: How bad is the pain?   (Scale 0-10; or none, mild, moderate or severe)     It's not going away. 4. RECURRENT SYMPTOM: Have you ever had sinus problems before? If Yes, ask: When was the last time? and What happened that time?      Not asked 5. NASAL CONGESTION: Is the nose blocked? If Yes, ask: Can you open it or must you breathe through your mouth?     Yes 6. NASAL DISCHARGE: Do you have discharge from your nose? If so ask, What color?     It's white mucus from my nose. 7. FEVER: Do you have a fever? If Yes, ask: What is it, how was it measured, and when did it start?      No 8. OTHER SYMPTOMS: Do you have any other symptoms? (e.g., sore throat, cough, earache, difficulty breathing)     Coughing, post nasal drainage.    The post nasal drip is bad.    9. PREGNANCY: Is there any chance you are pregnant? When was your last menstrual  period?     N/A due to age  Protocols used: Sinus Pain or Congestion-A-AH

## 2024-02-19 NOTE — Telephone Encounter (Signed)
 First attempt; no answer.    Real bad Chest congestion, real bad nasal congestion.

## 2024-02-20 ENCOUNTER — Encounter: Payer: Self-pay | Admitting: Internal Medicine

## 2024-02-20 ENCOUNTER — Ambulatory Visit: Admitting: Internal Medicine

## 2024-02-20 VITALS — BP 120/74 | HR 77 | Temp 98.1°F | Ht 64.0 in | Wt 136.0 lb

## 2024-02-20 DIAGNOSIS — I10 Essential (primary) hypertension: Secondary | ICD-10-CM | POA: Diagnosis not present

## 2024-02-20 DIAGNOSIS — J309 Allergic rhinitis, unspecified: Secondary | ICD-10-CM | POA: Diagnosis not present

## 2024-02-20 DIAGNOSIS — E538 Deficiency of other specified B group vitamins: Secondary | ICD-10-CM | POA: Diagnosis not present

## 2024-02-20 DIAGNOSIS — J0120 Acute ethmoidal sinusitis, unspecified: Secondary | ICD-10-CM | POA: Diagnosis not present

## 2024-02-20 DIAGNOSIS — J019 Acute sinusitis, unspecified: Secondary | ICD-10-CM | POA: Insufficient documentation

## 2024-02-20 DIAGNOSIS — E559 Vitamin D deficiency, unspecified: Secondary | ICD-10-CM

## 2024-02-20 DIAGNOSIS — R739 Hyperglycemia, unspecified: Secondary | ICD-10-CM

## 2024-02-20 MED ORDER — AMOXICILLIN-POT CLAVULANATE 875-125 MG PO TABS: 1.0000 | ORAL_TABLET | Freq: Two times a day (BID) | ORAL | 0 refills | Status: DC

## 2024-02-20 MED ORDER — HYDROCODONE BIT-HOMATROP MBR 5-1.5 MG/5ML PO SOLN: 5.0000 mL | Freq: Four times a day (QID) | ORAL | 0 refills | Status: DC | PRN

## 2024-02-20 MED ORDER — PREDNISONE 10 MG PO TABS: ORAL_TABLET | ORAL | 0 refills | Status: DC

## 2024-02-20 NOTE — Assessment & Plan Note (Signed)
Mild to mod, for prednisone taper  to f/u any worsening symptoms or concerns

## 2024-02-20 NOTE — Addendum Note (Signed)
 Addended by: NORLEEN LYNWOOD ORN on: 02/20/2024 09:32 PM   Modules accepted: Orders

## 2024-02-20 NOTE — Patient Instructions (Addendum)
 Please take all new medication as prescribed -- the antibiotic, prednsione, and cough medicine as needed  Please continue all other medications as before, and refills have been done if requested.  Please have the pharmacy call with any other refills you may need.  Please continue your efforts at being more active, low cholesterol diet, and weight control.  You are otherwise up to date with prevention measures today.  Please keep your appointments with your specialists as you may have planned  Please make an Appointment to return in Mar 14 2024, or sooner if needed, also with Lab Appointment for testing done 3-5 days before at the FIRST FLOOR Lab (so this is for TWO appointments - please see the scheduling desk as you leave)

## 2024-02-20 NOTE — Assessment & Plan Note (Signed)
 Mild to mod, for antibx course augmentin  875 bid course, cough med prn,  to f/u any worsening symptoms or concerns

## 2024-02-20 NOTE — Assessment & Plan Note (Signed)
Last vitamin D Lab Results  Component Value Date   VD25OH 42.69 09/07/2022   Stable, cont oral replacement

## 2024-02-20 NOTE — Assessment & Plan Note (Signed)
 BP Readings from Last 3 Encounters:  02/20/24 120/74  10/17/23 (!) 163/68  10/13/23 130/60   Stable, pt to continue medical treatment hct 12.5 mg qd

## 2024-02-20 NOTE — Progress Notes (Signed)
 Patient ID: Lauren [name], female   DOB: [birth year], 78 y.o.   MRN: [id number]        Chief Complaint: follow up acute sinusitis, allergic rhinitis, low vit d, hyperglycemia, htn       HPI:  Lauren [name] is a 78 y.o. female  Here with 2-3 days acute onset fever, facial pain, pressure, headache, general weakness and malaise, and greenish d/c, with mild ST and cough, but pt denies chest pain, wheezing, increased sob or doe, orthopnea, PND, increased LE swelling, palpitations, dizziness or syncope.  Does have several wks ongoing nasal allergy symptoms with clearish congestion, itch and sneezing, without fever, pain, ST, cough, swelling or wheezing.   Pt denies polydipsia, polyuria, or new focal neuro s/s.    Pt denies fever, wt loss, night sweats, loss of appetite, or other constitutional symptoms         Wt Readings from Last 3 Encounters:  02/20/24 136 lb (61.7 kg)  10/17/23 137 lb (62.1 kg)  10/06/23 134 lb 7.7 oz (61 kg)   BP Readings from Last 3 Encounters:  02/20/24 120/74  10/17/23 (!) 163/68  10/13/23 130/60         Past Medical History:  Diagnosis Date   Anxiety    Arthritis    Carotid stenosis 10/08/2013   Cervical disc disease    Depression    Diverticulitis    Diverticulosis    Frequent headaches    Hypertension    does not now per pt.   Hypothyroidism 10/26/2015   Impaired glucose tolerance 10/08/2013   normal HgA1C   Neck pain    Psoriasis    Past Surgical History:  Procedure Laterality Date   BREAST BIOPSY Left 2011   benign    CERVICAL FUSION     approx 1980   CHOLECYSTECTOMY     COLONOSCOPY     I & D EXTREMITY Right 03/07/2022   Procedure: IRRIGATION AND DEBRIDEMENT OF FOOT WITH BONE BIOPSY;  Surgeon: Gershon Donnice SAUNDERS, DPM;  Location: [facility] OR;  Service: Podiatry;  Laterality: Right;   POSTERIOR CERVICAL FUSION/FORAMINOTOMY N/A 11/05/2014   Procedure: Cervical four to Cervical seven  Posterior Cervical Fusion with lateral mass fixation;  Surgeon: Alm GORMAN Molt, MD;  Location: [facility];  Service: Neurosurgery;  Laterality: N/A;   C4 - C7 Posterior Cervical Fusion with lateral mass fixation   TOTAL ABDOMINAL HYSTERECTOMY  1998   Fibroid   UPPER GASTROINTESTINAL ENDOSCOPY      reports that she has never smoked. She has never been exposed to tobacco smoke. She has never used smokeless tobacco. She reports that she does not currently use alcohol. She reports that she does not use drugs. family history includes Alcohol abuse in an other family member; Alcoholism in her father; Arthritis in an other family member; Cancer in an other family member; Cervical cancer in her mother; Colon cancer in her father; Diabetes in her brother and mother; Heart attack in her mother; Heart disease in her mother and another family member; Hypertension in an other family member; Lung cancer in her father; Throat cancer in her brother. Allergies  Allergen Reactions   Celebrex [Celecoxib] Itching and Rash   Current Outpatient Medications on File Prior to Visit  Medication Sig Dispense Refill   ALPRAZolam  (XANAX ) 1 MG tablet TAKE 1/2 TO 1 TABLET BY MOUTH TWICE DAILY AS NEEDED 60 tablet 2   aspirin EC 81 MG tablet Take 81 mg by mouth daily. Swallow whole.  cephALEXin  (KEFLEX ) 500 MG capsule Take 1 capsule (500 mg total) by mouth 3 (three) times daily. 21 capsule 0   cholecalciferol (VITAMIN D3) 25 MCG (1000 UNIT) tablet Take 1,000 Units by mouth daily.     citalopram  (CELEXA ) 10 MG tablet TAKE 1 TABLET(10 MG) BY MOUTH DAILY 90 tablet 3   ezetimibe  (ZETIA ) 10 MG tablet TAKE 1 TABLET(10 MG) BY MOUTH DAILY 90 tablet 3   famotidine  (PEPCID ) 40 MG tablet Take 40 mg by mouth daily.     hydrochlorothiazide  (MICROZIDE ) 12.5 MG capsule TAKE 1 CAPSULE BY MOUTH DAILY 100 capsule 2   ibuprofen  (ADVIL ) 800 MG tablet TAKE 1 TABLET(800 MG) BY MOUTH TWICE DAILY AS NEEDED FOR MODERATE PAIN 60 tablet 4   levothyroxine  (SYNTHROID ) 50 MCG tablet TAKE 1 TABLET BY MOUTH DAILY 100 tablet  2   RESTASIS  0.05 % ophthalmic emulsion Place 1 drop into both eyes 2 (two) times daily.     vitamin B-12 (CYANOCOBALAMIN ) 500 MCG tablet Take 500 mcg by mouth daily.     zolpidem  (AMBIEN ) 5 MG tablet Take 1 tablet (5 mg total) by mouth at bedtime as needed. for sleep 90 tablet 1   No current facility-administered medications on file prior to visit.        ROS:  All others reviewed and negative.  Objective        PE:  BP 120/74 (BP Location: Right Arm, Patient Position: Sitting, Cuff Size: Normal)   Pulse 77   Temp 98.1 F (36.7 C) (Oral)   Ht 5' 4 (1.626 m)   Wt 136 lb (61.7 kg)   LMP 05/22/1996   SpO2 95%   BMI 23.34 kg/m                 Constitutional: Pt appears in NAD               HENT: Head: NCAT.                Right Ear: External ear normal.                 Left Ear: External ear normal.                Eyes: . Pupils are equal, round, and reactive to light. Conjunctivae and EOM are normal               Nose: without d/c or deformity               Neck: Neck supple. Gross normal ROM               Cardiovascular: Normal rate and regular rhythm.                 Pulmonary/Chest: Effort normal and breath sounds without rales or wheezing.                Abd:  Soft, NT, ND, + BS, no organomegaly               Neurological: Pt is alert. At baseline orientation, motor grossly intact               Skin: Skin is warm. No rashes, no other new lesions, LE edema - none               Psychiatric: Pt behavior is normal without agitation   Micro: none  Cardiac tracings I have personally interpreted today:  none  Pertinent Radiological findings (summarize): none  Lab Results  Component Value Date   WBC 5.6 02/23/2023   HGB 12.4 02/23/2023   HCT 37.3 02/23/2023   PLT 243.0 02/23/2023   GLUCOSE 103 (H) 02/23/2023   CHOL 139 09/07/2022   TRIG 195.0 (H) 09/07/2022   HDL 38.60 (L) 09/07/2022   LDLDIRECT 75.0 04/26/2021   LDLCALC 61 09/07/2022   ALT 11 02/23/2023   AST 17  02/23/2023   NA 143 02/23/2023   K 4.0 02/23/2023   CL 101 02/23/2023   CREATININE 0.70 02/23/2023   BUN 14 02/23/2023   CO2 33 (H) 02/23/2023   TSH 0.75 02/23/2023   INR 0.9 07/16/2021   HGBA1C 5.8 09/07/2022   Assessment/Plan:  Lauren [name] is a 78 y.o. White or Caucasian [1] female with  has a past medical history of Anxiety, Arthritis, Carotid stenosis (10/08/2013), Cervical disc disease, Depression, Diverticulitis, Diverticulosis, Frequent headaches, Hypertension, Hypothyroidism (10/26/2015), Impaired glucose tolerance (10/08/2013), Neck pain, and Psoriasis.  Vitamin D  deficiency Last vitamin D  Lab Results  Component Value Date   VD25OH 42.69 09/07/2022   Stable, cont oral replacement   Hyperglycemia Lab Results  Component Value Date   HGBA1C 5.8 09/07/2022   Stable, pt to continue current medical treatment  - diet, wt control   Essential hypertension BP Readings from Last 3 Encounters:  02/20/24 120/74  10/17/23 (!) 163/68  10/13/23 130/60   Stable, pt to continue medical treatment hct 12.5 mg qd   Acute sinus infection Mild to mod, for antibx course augmentin  875 bid course, cough med prn,  to f/u any worsening symptoms or concerns  Allergic rhinitis Mild to mod, for prednisone  taper.  to f/u any worsening symptoms or concerns  Followup: Return in about 23 days (around 03/14/2024).  Lynwood Rush, MD 02/20/2024 9:30 PM [facility] Medicine

## 2024-02-20 NOTE — Assessment & Plan Note (Signed)
Lab Results  Component Value Date   HGBA1C 5.8 09/07/2022   Stable, pt to continue current medical treatment  - diet, wt control

## 2024-02-21 ENCOUNTER — Other Ambulatory Visit: Payer: Self-pay | Admitting: Internal Medicine

## 2024-02-21 MED ORDER — ALPRAZOLAM 1 MG PO TABS: ORAL_TABLET | ORAL | 0 refills | Status: DC

## 2024-02-21 NOTE — Telephone Encounter (Signed)
 Copied from CRM #[id number]. Topic: Clinical - Medication Refill >> Feb 21, 2024  8:22 AM Aisha D wrote: Medication: ALPRAZolam  (XANAX ) 1 MG tablet  Has the patient contacted their pharmacy? Yes (Agent: If no, request that the patient contact the pharmacy for the refill. If patient does not wish to contact the pharmacy document the reason why and proceed with request.) (Agent: If yes, when and what did the pharmacy advise?)  This is the patient's preferred pharmacy:  [id number] [address], [place] - [address] AT [facility] OF [address] & [facility] [address] [place] [address] Phone: [phone] Fax: [phone]    Is this the correct pharmacy for this prescription? Yes If no, delete pharmacy and type the correct one.   Has the prescription been filled recently? No  Is the patient out of the medication? No  Has the patient been seen for an appointment in the last year OR does the patient have an upcoming appointment? Yes  Can we respond through MyChart? Yes  Agent: Please be advised that Rx refills may take up to 3 business days. We ask that you follow-up with your pharmacy.

## 2024-02-27 ENCOUNTER — Ambulatory Visit: Payer: Self-pay

## 2024-02-27 ENCOUNTER — Telehealth: Payer: Self-pay | Admitting: Internal Medicine

## 2024-02-27 NOTE — Telephone Encounter (Signed)
 Very sorry, no this should not be needed unless she is clearly worsening such as fever, pain, facial swelling or other unusual symptoms.  There can be some irritation left over that would not necessarily need further treatment.  Other options would include ENT referral if she wants.  thanks

## 2024-02-27 NOTE — Telephone Encounter (Signed)
 Another phone encounter has been routed to provider.

## 2024-02-27 NOTE — Telephone Encounter (Signed)
 FYI Only or Action Required?: Action required by provider: clinical question for provider, update on patient condition, and pt requesting 2nd round of antibiotics due to not feeling completely better and states she was advised by Dr Lynwood Rush to call back for a possible second round of antibiotics.  Patient was last seen in primary care on 02/20/2024 by Rush Lynwood ORN, MD.  Called Nurse Triage reporting Sinusitis.  Interventions attempted: Prescription medications: prescribed antibiotic.  Symptoms are: unchanged.  Triage Disposition: See PCP When Office is Open (Within 3 Days)  Patient/caregiver understands and will follow disposition?: No, wishes to speak with PCP        Reason for Disposition  [1] Taking antibiotic > 7 days AND [2] nasal discharge not improved  Answer Assessment - Initial Assessment Questions Drainage still draining in throat from sinuses Denies fever, cough, worsening of symptoms Patient states she was advised by Dr Lynwood Rush that it may take another round of antibiotics to clear her up completely and she states that nothing is different or worse and that she is still having the sinus drainage. Patient states that is the only reason for her call is to advise him that she is still not a whole lot better because he told her to do so. She states she was advised by Dr Lynwood Rush at her recent visit for same symptom to call back to the office if she wasn't better because she may need that second round of antibiotics. She states she has not had to use the cough syrup because she has not been coughing. Patient was advised to call us  back with any further, questions, concerns, or if anything worsens.  Protocols used: Sinus Infection on Antibiotic Follow-up Call-[facility], Information Only Call - No Triage-[facility]

## 2024-02-27 NOTE — Telephone Encounter (Unsigned)
 Copied from CRM [id number]. Topic: Clinical - Medication Refill >> Feb 27, 2024  8:21 AM Thersia C wrote: Medication: amoxicillin -clavulanate (AUGMENTIN ) 875-125 MG tablet  Has the patient contacted their pharmacy? Yes (Agent: If no, request that the patient contact the pharmacy for the refill. If patient does not wish to contact the pharmacy document the reason why and proceed with request.) (Agent: If yes, when and what did the pharmacy advise?)  This is the patient's preferred pharmacy:  [facility] #[id number] [address], [place] - [address] AT [facility] [address] & [facility] [address] [place] [address] Phone: [phone] Fax: [phone]   Is this the correct pharmacy for this prescription? Yes If no, delete pharmacy and type the correct one.   Has the prescription been filled recently? No  Is the patient out of the medication? Yes  Has the patient been seen for an appointment in the last year OR does the patient have an upcoming appointment? Yes  Can we respond through MyChart? Yes  Agent: Please be advised that Rx refills may take up to 3 business days. We ask that you follow-up with your pharmacy.

## 2024-03-03 ENCOUNTER — Other Ambulatory Visit: Payer: Self-pay | Admitting: Internal Medicine

## 2024-03-04 NOTE — Telephone Encounter (Signed)
 Called and Pt states she had already spoke with someone at the office & she states she is feeling better.

## 2024-03-14 ENCOUNTER — Other Ambulatory Visit: Payer: Self-pay | Admitting: Internal Medicine

## 2024-03-14 ENCOUNTER — Ambulatory Visit: Payer: Self-pay | Admitting: Internal Medicine

## 2024-03-14 ENCOUNTER — Ambulatory Visit (INDEPENDENT_AMBULATORY_CARE_PROVIDER_SITE_OTHER): Admitting: Internal Medicine

## 2024-03-14 ENCOUNTER — Encounter: Payer: Self-pay | Admitting: Internal Medicine

## 2024-03-14 VITALS — BP 124/76 | HR 75 | Temp 98.3°F | Ht 64.0 in | Wt 132.0 lb

## 2024-03-14 DIAGNOSIS — E538 Deficiency of other specified B group vitamins: Secondary | ICD-10-CM

## 2024-03-14 DIAGNOSIS — F32A Depression, unspecified: Secondary | ICD-10-CM

## 2024-03-14 DIAGNOSIS — Z0000 Encounter for general adult medical examination without abnormal findings: Secondary | ICD-10-CM

## 2024-03-14 DIAGNOSIS — E7800 Pure hypercholesterolemia, unspecified: Secondary | ICD-10-CM | POA: Diagnosis not present

## 2024-03-14 DIAGNOSIS — R739 Hyperglycemia, unspecified: Secondary | ICD-10-CM

## 2024-03-14 DIAGNOSIS — I10 Essential (primary) hypertension: Secondary | ICD-10-CM | POA: Diagnosis not present

## 2024-03-14 DIAGNOSIS — E039 Hypothyroidism, unspecified: Secondary | ICD-10-CM | POA: Diagnosis not present

## 2024-03-14 DIAGNOSIS — E559 Vitamin D deficiency, unspecified: Secondary | ICD-10-CM

## 2024-03-14 DIAGNOSIS — Z0001 Encounter for general adult medical examination with abnormal findings: Secondary | ICD-10-CM

## 2024-03-14 LAB — CBC WITH DIFFERENTIAL/PLATELET
Basophils Absolute: 0 K/uL (ref 0.0–0.1)
Basophils Relative: 0.7 % (ref 0.0–3.0)
Eosinophils Absolute: 0.1 K/uL (ref 0.0–0.7)
Eosinophils Relative: 1.3 % (ref 0.0–5.0)
HCT: 39.4 % (ref 36.0–46.0)
Hemoglobin: 12.9 g/dL (ref 12.0–15.0)
Lymphocytes Relative: 22.7 % (ref 12.0–46.0)
Lymphs Abs: 1.2 K/uL (ref 0.7–4.0)
MCHC: 32.9 g/dL (ref 30.0–36.0)
MCV: 95.3 fl (ref 78.0–100.0)
Monocytes Absolute: 0.4 K/uL (ref 0.1–1.0)
Monocytes Relative: 7.3 % (ref 3.0–12.0)
Neutro Abs: 3.5 K/uL (ref 1.4–7.7)
Neutrophils Relative %: 68 % (ref 43.0–77.0)
Platelets: 237 K/uL (ref 150.0–400.0)
RBC: 4.13 Mil/uL (ref 3.87–5.11)
RDW: 13.6 % (ref 11.5–15.5)
WBC: 5.1 K/uL (ref 4.0–10.5)

## 2024-03-14 LAB — URINALYSIS, ROUTINE W REFLEX MICROSCOPIC
Bilirubin Urine: NEGATIVE
Hgb urine dipstick: NEGATIVE
Ketones, ur: NEGATIVE
Leukocytes,Ua: NEGATIVE
Nitrite: NEGATIVE
RBC / HPF: NONE SEEN (ref 0–?)
Specific Gravity, Urine: 1.02 (ref 1.000–1.030)
Total Protein, Urine: NEGATIVE
Urine Glucose: NEGATIVE
Urobilinogen, UA: 0.2 (ref 0.0–1.0)
pH: 6 (ref 5.0–8.0)

## 2024-03-14 LAB — VITAMIN D 25 HYDROXY (VIT D DEFICIENCY, FRACTURES): VITD: 37.4 ng/mL (ref 30.00–100.00)

## 2024-03-14 LAB — HEPATIC FUNCTION PANEL
ALT: 16 U/L (ref 0–35)
AST: 17 U/L (ref 0–37)
Albumin: 4.3 g/dL (ref 3.5–5.2)
Alkaline Phosphatase: 76 U/L (ref 39–117)
Bilirubin, Direct: 0.2 mg/dL (ref 0.0–0.3)
Total Bilirubin: 0.6 mg/dL (ref 0.2–1.2)
Total Protein: 7.2 g/dL (ref 6.0–8.3)

## 2024-03-14 LAB — LIPID PANEL
Cholesterol: 122 mg/dL (ref 0–200)
HDL: 36.6 mg/dL — ABNORMAL LOW (ref >39.00)
LDL Cholesterol: 56 mg/dL (ref 0–99)
NonHDL: 85.2
Total CHOL/HDL Ratio: 3
Triglycerides: 144 mg/dL (ref 0.0–149.0)
VLDL: 28.8 mg/dL (ref 0.0–40.0)

## 2024-03-14 LAB — BASIC METABOLIC PANEL WITH GFR
BUN: 18 mg/dL (ref 6–23)
CO2: 30 meq/L (ref 19–32)
Calcium: 9.5 mg/dL (ref 8.4–10.5)
Chloride: 101 meq/L (ref 96–112)
Creatinine, Ser: 0.74 mg/dL (ref 0.40–1.20)
GFR: 77.15 mL/min (ref >60.00)
Glucose, Bld: 84 mg/dL (ref 70–99)
Potassium: 3.1 meq/L — ABNORMAL LOW (ref 3.5–5.1)
Sodium: 143 meq/L (ref 135–145)

## 2024-03-14 LAB — TSH: TSH: 4.19 u[IU]/mL (ref 0.35–5.50)

## 2024-03-14 LAB — VITAMIN B12: Vitamin B-12: >1500 pg/mL — ABNORMAL HIGH (ref 211–911)

## 2024-03-14 LAB — HEMOGLOBIN A1C: Hgb A1c MFr Bld: 6.1 % (ref 4.6–6.5)

## 2024-03-14 MED ORDER — POTASSIUM CHLORIDE ER 10 MEQ PO TBCR: 10.0000 meq | EXTENDED_RELEASE_TABLET | Freq: Every day | ORAL | 3 refills | Status: DC

## 2024-03-14 MED ORDER — LEVOTHYROXINE SODIUM 50 MCG PO TABS: 50.0000 ug | ORAL_TABLET | Freq: Every day | ORAL | 3 refills | Status: AC

## 2024-03-14 MED ORDER — CITALOPRAM HYDROBROMIDE 10 MG PO TABS: 10.0000 mg | ORAL_TABLET | Freq: Every day | ORAL | 3 refills | Status: DC

## 2024-03-14 NOTE — Progress Notes (Signed)
 Patient ID: Lauren [name], female   DOB: 06/26/1945, 78 y.o.   MRN: [id number]         Chief Complaint:: wellness exam and insomnia, family stress, low vit d, low thyroid , hyperglycemia, hld, htn       HPI:  Lauren [name] is a 78 y.o. female here for wellness exam; has Dxa scheduled, o/w up to date                        Also Pt denies chest pain, increased sob or doe, wheezing, orthopnea, PND, increased LE swelling, palpitations, dizziness or syncope.   Pt denies polydipsia, polyuria, or new focal neuro s/s.    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  Has had mor stress, but Denies worsening depressive symptoms, suicidal ideation, or panic; Does have worsneing insomnia in that she typically can sleep only 3 hrs with the ambien  5 mg at bedtime prn, but does not want any change for now.     Wt Readings from Last 3 Encounters:  03/14/24 132 lb (59.9 kg)  02/20/24 136 lb (61.7 kg)  10/17/23 137 lb (62.1 kg)   BP Readings from Last 3 Encounters:  03/14/24 124/76  02/20/24 120/74  10/17/23 (!) 163/68   Immunization History  Administered Date(s) Administered   Fluad Quad(high Dose 65+) 02/07/2019, 02/14/2024   Fluad Trivalent(High Dose 65+) 02/23/2023   INFLUENZA, HIGH DOSE SEASONAL PF 03/27/2018, 02/26/2020   Influenza-Unspecified 02/20/2015, 02/26/2016, 03/22/2018, 03/14/2021   PFIZER Comirnaty(Gray Top)Covid-19 Tri-Sucrose Vaccine 08/23/2020   PFIZER(Purple Top)SARS-COV-2 Vaccination 06/28/2019, 07/22/2019, 02/16/2020, 02/28/2021   Pfizer Covid-19 Vaccine Bivalent Booster 59yrs & up 03/17/2021   Pneumococcal Conjugate-13 10/08/2013   Pneumococcal Polysaccharide-23 10/14/2014   Tdap 05/22/2016, 10/06/2023   Zoster Recombinant(Shingrix) 07/04/2017, 10/31/2017   Zoster, Live 10/15/2014   There are no preventive care reminders to display for this  patient.     Past Medical History:  Diagnosis Date   Anxiety    Arthritis    Carotid stenosis 10/08/2013   Cervical disc disease    Depression    Diverticulitis    Diverticulosis    Frequent headaches    Hypertension    does not now per pt.   Hypothyroidism 10/26/2015   Impaired glucose tolerance 10/08/2013   normal HgA1C   Neck pain    Psoriasis    Past Surgical History:  Procedure Laterality Date   BREAST BIOPSY Left 2011   benign    CERVICAL FUSION     approx 1980   CHOLECYSTECTOMY     COLONOSCOPY     I & D EXTREMITY Right 03/07/2022   Procedure: IRRIGATION AND DEBRIDEMENT OF FOOT WITH BONE BIOPSY;  Surgeon: Gershon Donnice SAUNDERS, DPM;  Location: [facility] OR;  Service: Podiatry;  Laterality: Right;   POSTERIOR CERVICAL FUSION/FORAMINOTOMY N/A 11/05/2014   Procedure: Cervical four to Cervical seven  Posterior Cervical Fusion with lateral mass fixation;  Surgeon: Alm GORMAN Molt, MD;  Location: [facility];  Service: Neurosurgery;  Laterality: N/A;   C4 - C7 Posterior Cervical Fusion with lateral mass fixation   TOTAL ABDOMINAL HYSTERECTOMY  1998   Fibroid   UPPER GASTROINTESTINAL ENDOSCOPY      reports that she has never smoked. She has never been exposed to tobacco smoke. She has never used smokeless tobacco. She reports that she does not currently  use alcohol. She reports that she does not use drugs. family history includes Alcohol abuse in an other family member; Alcoholism in her father; Arthritis in an other family member; Cancer in an other family member; Cervical cancer in her mother; Colon cancer in her father; Diabetes in her brother and mother; Heart attack in her mother; Heart disease in her mother and another family member; Hypertension in an other family member; Lung cancer in her father; Throat cancer in her brother. Allergies  Allergen Reactions   Celebrex [Celecoxib] Itching and Rash   Current Outpatient Medications on File Prior to Visit  Medication Sig Dispense  Refill   ALPRAZolam  (XANAX ) 1 MG tablet TAKE 1/2 TO 1 TABLET BY MOUTH TWICE DAILY AS NEEDED 60 tablet 0   aspirin EC 81 MG tablet Take 81 mg by mouth daily. Swallow whole.     cholecalciferol (VITAMIN D3) 25 MCG (1000 UNIT) tablet Take 1,000 Units by mouth daily.     ezetimibe  (ZETIA ) 10 MG tablet TAKE 1 TABLET(10 MG) BY MOUTH DAILY 90 tablet 3   famotidine  (PEPCID ) 40 MG tablet Take 40 mg by mouth daily.     hydrochlorothiazide  (MICROZIDE ) 12.5 MG capsule TAKE 1 CAPSULE BY MOUTH DAILY 100 capsule 2   ibuprofen  (ADVIL ) 800 MG tablet TAKE 1 TABLET(800 MG) BY MOUTH TWICE DAILY AS NEEDED FOR MODERATE PAIN 60 tablet 4   RESTASIS  0.05 % ophthalmic emulsion Place 1 drop into both eyes 2 (two) times daily.     vitamin B-12 (CYANOCOBALAMIN ) 500 MCG tablet Take 500 mcg by mouth daily.     zolpidem  (AMBIEN ) 5 MG tablet Take 1 tablet (5 mg total) by mouth at bedtime as needed. for sleep 90 tablet 1   No current facility-administered medications on file prior to visit.        ROS:  All others reviewed and negative.  Objective        PE:  BP 124/76 (BP Location: Right Arm, Patient Position: Sitting, Cuff Size: Normal)   Pulse 75   Temp 98.3 F (36.8 C) (Oral)   Ht 5' 4 (1.626 m)   Wt 132 lb (59.9 kg)   LMP 05/22/1996   SpO2 93%   BMI 22.66 kg/m                 Constitutional: Pt appears in NAD               HENT: Head: NCAT.                Right Ear: External ear normal.                 Left Ear: External ear normal.                Eyes: . Pupils are equal, round, and reactive to light. Conjunctivae and EOM are normal               Nose: without d/c or deformity               Neck: Neck supple. Gross normal ROM               Cardiovascular: Normal rate and regular rhythm.                 Pulmonary/Chest: Effort normal and breath sounds without rales or wheezing.                Abd:  Soft, NT, ND, + BS, no organomegaly  Neurological: Pt is alert. At baseline orientation, motor  grossly intact               Skin: Skin is warm. No rashes, no other new lesions, LE edema - none               Psychiatric: Pt behavior is normal without agitation , mild depressed affect  Micro: none  Cardiac tracings I have personally interpreted today:  none  Pertinent Radiological findings (summarize): none   Lab Results  Component Value Date   WBC 5.1 03/14/2024   HGB 12.9 03/14/2024   HCT 39.4 03/14/2024   PLT 237.0 03/14/2024   GLUCOSE 84 03/14/2024   CHOL 122 03/14/2024   TRIG 144.0 03/14/2024   HDL 36.60 (L) 03/14/2024   LDLDIRECT 75.0 04/26/2021   LDLCALC 56 03/14/2024   ALT 16 03/14/2024   AST 17 03/14/2024   NA 143 03/14/2024   K 3.1 (L) 03/14/2024   CL 101 03/14/2024   CREATININE 0.74 03/14/2024   BUN 18 03/14/2024   CO2 30 03/14/2024   TSH 4.19 03/14/2024   INR 0.9 07/16/2021   HGBA1C 6.1 03/14/2024   Assessment/Plan:  Lauren [name] is a 78 y.o. White or Caucasian [1] female with  has a past medical history of Anxiety, Arthritis, Carotid stenosis (10/08/2013), Cervical disc disease, Depression, Diverticulitis, Diverticulosis, Frequent headaches, Hypertension, Hypothyroidism (10/26/2015), Impaired glucose tolerance (10/08/2013), Neck pain, and Psoriasis.  Encounter for well adult exam with abnormal findings Age and sex appropriate education and counseling updated with regular exercise and diet Referrals for preventative services - has DXA scheduled Immunizations addressed - none needed Smoking counseling  - none needed Evidence for depression or other mood disorder - increased stress , declines referral counseling for now Most recent labs reviewed. I have personally reviewed and have noted: 1) the patient's medical and social history 2) The patient's current medications and supplements 3) The patient's height, weight, and BMI have been recorded in the chart   Vitamin D  deficiency Last vitamin D  Lab Results  Component Value Date   VD25OH 37.40  03/14/2024   Low, to start oral replacement   Hypothyroidism Lab Results  Component Value Date   TSH 4.19 03/14/2024   Stable, pt to continue levothyroxine  50 mcg qd   Hyperglycemia Lab Results  Component Value Date   HGBA1C 6.1 03/14/2024   Stable, pt to continue current medical treatment  - diet, wt control   HLD (hyperlipidemia) Lab Results  Component Value Date   LDLCALC 56 03/14/2024   Stable, pt to continue zetia  10 mg qd   Essential hypertension BP Readings from Last 3 Encounters:  03/14/24 124/76  02/20/24 120/74  10/17/23 (!) 163/68   Stable, pt to continue medical treatment hct 12.5 mg qd   depression and anxiety  Overall stable despite increase stress recently, declines counseling referral, cont celexa  10 mg qd  Followup: Return in about 6 months (around 09/12/2024).  Lynwood Rush, MD 03/17/2024 11:55 AM  [facility] - [facility] Internal Medicine

## 2024-03-17 ENCOUNTER — Telehealth: Payer: Self-pay | Admitting: Internal Medicine

## 2024-03-17 ENCOUNTER — Encounter: Payer: Self-pay | Admitting: Internal Medicine

## 2024-03-17 NOTE — Telephone Encounter (Signed)
 Pt returned call. She reports that for several months she has been pooping out little brown pellets. Denies abd pain. Mild bloating, gas prior to bm. Occasionally pt has looser soft stools as well. Reports she drinks plenty of water  throughout the day. Pt has not been seen in clinic since 2021. Discussed with pt that these symptoms sound similar to constipation. Advised pt to add Miralax  one capful to her daily routine. Pt verbalized understanding.

## 2024-03-17 NOTE — Assessment & Plan Note (Signed)
 Lab Results  Component Value Date   HGBA1C 6.1 03/14/2024   Stable, pt to continue current medical treatment  - diet, wt control

## 2024-03-17 NOTE — Assessment & Plan Note (Signed)
 Overall stable despite increase stress recently, declines counseling referral, cont celexa  10 mg qd

## 2024-03-17 NOTE — Telephone Encounter (Signed)
 Called pt. Discussed provider recommendations. Pt scheduled for f/u ov for 04/04/24 at 1130. Verbalized date and time. Advised pt to call us  back if she is still experiencing these issues after adding in the Miralax  for a couple of days. Verbalized understanding.

## 2024-03-17 NOTE — Assessment & Plan Note (Signed)
 BP Readings from Last 3 Encounters:  03/14/24 124/76  02/20/24 120/74  10/17/23 (!) 163/68   Stable, pt to continue medical treatment hct 12.5 mg qd

## 2024-03-17 NOTE — Patient Instructions (Signed)

## 2024-03-17 NOTE — Telephone Encounter (Signed)
 Inbound call from patient requesting to speak to Dr. Avram nurse in regards to her seeing brown pillet's in her stool. Patient is requesting a call back to her mobile number. Please advise.

## 2024-03-17 NOTE — Assessment & Plan Note (Signed)
 Age and sex appropriate education and counseling updated with regular exercise and diet Referrals for preventative services - has DXA scheduled Immunizations addressed - none needed Smoking counseling  - none needed Evidence for depression or other mood disorder - increased stress , declines referral counseling for now Most recent labs reviewed. I have personally reviewed and have noted: 1) the patient's medical and social history 2) The patient's current medications and supplements 3) The patient's height, weight, and BMI have been recorded in the chart

## 2024-03-17 NOTE — Assessment & Plan Note (Signed)
 Lab Results  Component Value Date   LDLCALC 56 03/14/2024   Stable, pt to continue zetia  10 mg qd

## 2024-03-17 NOTE — Telephone Encounter (Signed)
 Attempted to reach patient. No answer, left VM for patient to return call.

## 2024-03-17 NOTE — Assessment & Plan Note (Signed)
 Lab Results  Component Value Date   TSH 4.19 03/14/2024   Stable, pt to continue levothyroxine  50 mcg qd

## 2024-03-17 NOTE — Assessment & Plan Note (Signed)
 Last vitamin D  Lab Results  Component Value Date   VD25OH 37.40 03/14/2024   Low, to start oral replacement

## 2024-03-31 ENCOUNTER — Telehealth: Payer: Self-pay

## 2024-03-31 NOTE — Telephone Encounter (Signed)
Ok with me, thanks.

## 2024-03-31 NOTE — Telephone Encounter (Signed)
 Copied from CRM [id number]. Topic: Appointments - Transfer of Care >> Mar 31, 2024  2:21 PM Suzen RAMAN wrote: Pt is requesting to transfer FROM: Debby LITTIE Molt Pt is requesting to transfer TO: Cleatus Specking Reason for requested transfer: PCP retiring It is the responsibility of the team the patient would like to transfer to (Dr. Specking) to reach out to the patient if for any reason this transfer is not acceptable.

## 2024-04-01 NOTE — Telephone Encounter (Signed)
 Pt has appt

## 2024-04-01 NOTE — Telephone Encounter (Signed)
 Lvm to call and schedule [facility] appt. Please schedule at time of return call back

## 2024-04-01 NOTE — Telephone Encounter (Signed)
 Yes.  Okay with me.  Would like a 20-minute transfer of care visit at her convenience.  Thank you.

## 2024-04-04 ENCOUNTER — Ambulatory Visit: Admitting: Gastroenterology

## 2024-04-04 ENCOUNTER — Encounter: Payer: Self-pay | Admitting: Gastroenterology

## 2024-04-04 VITALS — BP 124/70 | HR 79 | Ht 64.0 in | Wt 131.8 lb

## 2024-04-04 DIAGNOSIS — L816 Other disorders of diminished melanin formation: Secondary | ICD-10-CM

## 2024-04-04 DIAGNOSIS — K5900 Constipation, unspecified: Secondary | ICD-10-CM

## 2024-04-04 DIAGNOSIS — R194 Change in bowel habit: Secondary | ICD-10-CM

## 2024-04-04 MED ORDER — NA SULFATE-K SULFATE-MG SULF 17.5-3.13-1.6 GM/177ML PO SOLN: 1.0000 | ORAL | 0 refills | Status: DC

## 2024-04-04 NOTE — Progress Notes (Signed)
 Lauren [name] [id number] 03-30-1946   Chief Complaint: Constipation  Referring Provider: Norleen Lynwood ORN, MD Primary GI MD: Dr. Avram  HPI: Lauren [name] is a 78 y.o. female with past medical history of anxiety/depression, carotid stenosis, diverticulitis, HTN, hypothyroidism, cholecystectomy, hysterectomy who presents today for a complaint of constipation.    Last seen in office 07/21/2019 for follow-up of diverticulitis.  At that time had significantly improved, continued to have some mild tenderness so was prescribed 10 more days of Keflex  with consideration for repeat imaging and also surgical referral for elective resection if she failed to improve.  She has not had complicated but has had recurrent diverticulitis.  Patient called 03/17/2024 at which time endorsed several months of passing stool that looked like little pellets, some mild bloating and gas prior to a bowel movement.  Was advised to add MiraLAX  1 capful daily to her routine send was scheduled for office visit.  Recent labs 03/14/2024: Normal CBC, low potassium otherwise normal BMP, normal hepatic function panel, normal TSH   Discussed the use of AI scribe software for clinical note transcription with the patient, who gave verbal consent to proceed.  History of Present Illness Lauren [name] is a 78 year old female with a history of constipation who presents with worsening constipation and pellet-like stools.  Constipation and altered bowel habits - Worsening constipation for the past two and a half months - Stools are small, pellet-like, varying in size from dime to nickel - Occasional mucus in stool, no blood - Bowel movements occur daily, typically in the morning after coffee - Previously had bowel movements every two to three days, which were solid - Long-standing history of constipation, but current symptoms are more severe  Response to laxatives and dietary interventions - Miralax  taken for one and a  half weeks at one capful daily, then increased to twice daily without improvement - Additional use of laxatives, enemas, and prunes provided minimal relief but caused gas - Continues to pass small stools despite interventions - Diet includes Raisin Bran, vegetables (Brussels sprouts, broccoli, cabbage, beans), limited fruit intake - Occasional dairy consumption, primarily yogurt and milk with cereal - Drinks a lot of water  - No recent changes in diet or activity level  Abdominal symptoms - No significant abdominal pain - Bloating worsens during the day and improves at night - No nausea, vomiting, or fever  Medication use - Current medications include Ambien , taken more frequently recently to aid sleep  Relevant gastrointestinal history - No history of abdominal surgeries - History of diverticulitis without surgical intervention - Colonoscopy five years ago showed no polyps - Recent blood work, including thyroid  function, was normal  Genitourinary symptoms - No urinary issues   Previous GI Procedures/Imaging   Colonoscopy 05/12/2019 - Severe diverticulosis in the sigmoid colon. There was narrowing of the colon in association with the diverticular opening.  - The examination was otherwise normal on direct and retroflexion views.  - No specimens collected. - No recall due to age  Past Medical History:  Diagnosis Date   Anxiety    Arthritis    Carotid stenosis 10/08/2013   Cervical disc disease    Depression    Diverticulitis    Diverticulosis    Frequent headaches    Hypertension    does not now per pt.   Hypothyroidism 10/26/2015   Impaired glucose tolerance 10/08/2013   normal HgA1C   Neck pain    Psoriasis     Past Surgical  History:  Procedure Laterality Date   BREAST BIOPSY Left 2011   benign    CERVICAL FUSION     approx 1980   CHOLECYSTECTOMY     COLONOSCOPY     I & D EXTREMITY Right 03/07/2022   Procedure: IRRIGATION AND DEBRIDEMENT OF FOOT WITH BONE  BIOPSY;  Surgeon: Gershon Donnice SAUNDERS, DPM;  Location: [facility] OR;  Service: Podiatry;  Laterality: Right;   POSTERIOR CERVICAL FUSION/FORAMINOTOMY N/A 11/05/2014   Procedure: Cervical four to Cervical seven  Posterior Cervical Fusion with lateral mass fixation;  Surgeon: Alm GORMAN Molt, MD;  Location: [facility];  Service: Neurosurgery;  Laterality: N/A;   C4 - C7 Posterior Cervical Fusion with lateral mass fixation   TOTAL ABDOMINAL HYSTERECTOMY  1998   Fibroid   UPPER GASTROINTESTINAL ENDOSCOPY      Current Outpatient Medications  Medication Sig Dispense Refill   aspirin EC 81 MG tablet Take 81 mg by mouth daily. Swallow whole.     cholecalciferol (VITAMIN D3) 25 MCG (1000 UNIT) tablet Take 1,000 Units by mouth daily.     ezetimibe  (ZETIA ) 10 MG tablet TAKE 1 TABLET(10 MG) BY MOUTH DAILY 90 tablet 3   famotidine  (PEPCID ) 40 MG tablet Take 40 mg by mouth daily.     hydrochlorothiazide  (MICROZIDE ) 12.5 MG capsule TAKE 1 CAPSULE BY MOUTH DAILY 100 capsule 2   levothyroxine  (SYNTHROID ) 50 MCG tablet Take 1 tablet (50 mcg total) by mouth daily. 100 tablet 3   RESTASIS  0.05 % ophthalmic emulsion Place 1 drop into both eyes 2 (two) times daily.     zolpidem  (AMBIEN ) 5 MG tablet Take 1 tablet (5 mg total) by mouth at bedtime as needed. for sleep 90 tablet 1   No current facility-administered medications for this visit.    Allergies as of 04/04/2024 - Review Complete 04/04/2024  Allergen Reaction Noted   Celebrex [celecoxib] Itching and Rash 04/10/2012    Family History  Problem Relation Age of Onset   Heart disease Mother    Diabetes Mother    Cervical cancer Mother    Heart attack Mother    Lung cancer Father    Alcoholism Father    Colon cancer Father    Diabetes Brother    Throat cancer Brother    Cancer Other        lung cancer   Heart disease Other    Hypertension Other    Arthritis Other    Alcohol abuse Other    Rectal cancer Neg Hx    Stomach cancer Neg Hx     Social  History   Tobacco Use   Smoking status: Never    Passive exposure: Never   Smokeless tobacco: Never  Vaping Use   Vaping status: Never Used  Substance Use Topics   Alcohol use: Not Currently   Drug use: No     Review of Systems:    Constitutional: No fever, chills, weakness Cardiovascular: No chest pain Respiratory: No SOB Gastrointestinal: See HPI and otherwise negative   Physical Exam:  Vital signs: BP 124/70   Pulse 79   Ht 5' 4 (1.626 m)   Wt 131 lb 12.8 oz (59.8 kg)   LMP 05/22/1996   BMI 22.62 kg/m   Wt Readings from Last 3 Encounters:  04/04/24 131 lb 12.8 oz (59.8 kg)  03/14/24 132 lb (59.9 kg)  02/20/24 136 lb (61.7 kg)    Constitutional: Pleasant, well appearing female in NAD, alert and cooperative Head:  Normocephalic and  atraumatic.  Eyes: No scleral icterus.  Respiratory: Respirations even and unlabored. Lungs clear to auscultation bilaterally.  No wheezes, crackles, or rhonchi.  Cardiovascular:  Regular rate and rhythm. No murmurs. No peripheral edema. Gastrointestinal:  Soft, nondistended, nontender. No rebound or guarding. Normal bowel sounds. No appreciable masses or hepatomegaly. Rectal:  Perianal hypopigmentation (see image below), patient denies itching or discomfort. Possible lichen sclerosis? No fissures, bleeding, or external hemorrhoids. No abnormalities felt on DRE. Chaperone present for exam. Neurologic:  Alert and oriented x4;  grossly normal neurologically.  Skin:   Dry and intact without significant lesions or rashes. Psychiatric: Oriented to person, place and time. Demonstrates good judgement and reason without abnormal affect or behaviors.   RELEVANT LABS AND IMAGING: CBC    Component Value Date/Time   WBC 5.1 03/14/2024 0903   RBC 4.13 03/14/2024 0903   HGB 12.9 03/14/2024 0903   HGB 11.9 07/25/2022 1429   HCT 39.4 03/14/2024 0903   HCT 36.6 07/25/2022 1429   PLT 237.0 03/14/2024 0903   PLT 222 07/25/2022 1429   MCV 95.3  03/14/2024 0903   MCV 94 07/25/2022 1429   MCH 30.7 07/25/2022 1429   MCH 32.2 05/29/2022 0841   MCHC 32.9 03/14/2024 0903   RDW 13.6 03/14/2024 0903   RDW 12.7 07/25/2022 1429   LYMPHSABS 1.2 03/14/2024 0903   LYMPHSABS 1.8 07/25/2022 1429   MONOABS 0.4 03/14/2024 0903   EOSABS 0.1 03/14/2024 0903   EOSABS 0.1 07/25/2022 1429   BASOSABS 0.0 03/14/2024 0903   BASOSABS 0.1 07/25/2022 1429    CMP     Component Value Date/Time   NA 143 03/14/2024 0903   NA 143 10/26/2017 0724   K 3.1 (L) 03/14/2024 0903   CL 101 03/14/2024 0903   CO2 30 03/14/2024 0903   GLUCOSE 84 03/14/2024 0903   BUN 18 03/14/2024 0903   BUN 12 10/26/2017 0724   CREATININE 0.74 03/14/2024 0903   CREATININE 0.72 05/29/2022 0841   CALCIUM 9.5 03/14/2024 0903   PROT 7.2 03/14/2024 0903   PROT 6.7 10/26/2017 0724   ALBUMIN 4.3 03/14/2024 0903   ALBUMIN 4.3 10/26/2017 0724   AST 17 03/14/2024 0903   ALT 16 03/14/2024 0903   ALKPHOS 76 03/14/2024 0903   BILITOT 0.6 03/14/2024 0903   BILITOT 0.5 10/26/2017 0724   GFRNONAA >60 03/09/2022 0311   GFRAA >60 03/22/2019 1605   Echocardiogram 10/22/2015 Impressions:  - Normal LV size with mild LV hypertrophy. EF 55-60%. Normal RV    size and systolic function. No significant valvular    abnormalities.   Assessment/Plan:   Assessment & Plan Change in bowel habits Chronic constipation with recent worsening of symptoms Perianal hypopigmentation Chronic constipation with exacerbation, characterized by pellet-like stools, bloating, and occasional mucus. Ongoing 2-3 months. No blood in stool or significant rectal or abdominal pain.  No improvement with OTC laxatives or enemas.  Tried MiraLAX  1-2 capfuls daily without improvement. On exam has hypopigmentation of perianal area which is asymptomatic, possible lichen sclerosis?  (Image reviewed by Dr. Avram who will evaluate during upcoming colonoscopy). No other significant abnormalities. Last colonoscopy 04/2019  with no polyps found and no recall recommended due to age. Patient very concerned about this change in normal bowel function, will evaluate further with colonoscopy.  - Schedule colonoscopy for change in bowel habits. I thoroughly discussed the procedure with the patient to include nature of the procedure, alternatives, benefits, and risks (including but not limited to bleeding, infection, perforation, anesthesia/cardiac/pulmonary  complications). Patient verbalized understanding and gave verbal consent to proceed with procedure.  - Start Benefiber 1 TBSP daily  - Provided Linzess 145 mcg samples - Continue adequate hydration and dietary fiber intake.   Camie Furbish, PA-C [facility] 04/04/2024, 11:47 AM  Patient Care Team: Norleen Lynwood ORN, MD as PCP - General (Internal Medicine) Mindi Mt, MD (Inactive) as Consulting Physician (Anesthesiology) [facility], [place] as Consulting Physician (Optometry)

## 2024-04-04 NOTE — Patient Instructions (Signed)
 We have sent the following medications to your pharmacy for you to pick up at your convenience: Suprep   You have been scheduled for a colonoscopy. Please follow written instructions given to you at your visit today.   If you use inhalers (even only as needed), please bring them with you on the day of your procedure.  DO NOT TAKE 7 DAYS PRIOR TO TEST- Trulicity (dulaglutide) Ozempic, Wegovy (semaglutide) Mounjaro, Zepbound (tirzepatide) Bydureon Bcise (exanatide extended release)  DO NOT TAKE 1 DAY PRIOR TO YOUR TEST Rybelsus (semaglutide) Adlyxin (lixisenatide) Victoza (liraglutide) Byetta (exanatide) ____________________________________________________________  We have given you samples of the following medication to take: Linzess 145 mcg - once daily.   A high fiber diet with plenty of fluids (up to 8 glasses of water  daily) is suggested to relieve these symptoms.  Benefiber , 1 tablespoon once daily can be used to keep bowels regular if needed.  _______________________________________________________  If your blood pressure at your visit was 140/90 or greater, please contact your primary care physician to follow up on this.  _______________________________________________________  If you are age 51 or older, your body mass index should be between 23-30. Your Body mass index is 22.62 kg/m. If this is out of the aforementioned range listed, please consider follow up with your Primary Care Provider.  If you are age 15 or younger, your body mass index should be between 19-25. Your Body mass index is 22.62 kg/m. If this is out of the aformentioned range listed, please consider follow up with your Primary Care Provider.   ________________________________________________________  The [facility] providers would like to encourage you to use MYCHART to communicate with providers for non-urgent requests or questions.  Due to long hold times on the telephone, sending your provider a  message by [facility] may be a faster and more efficient way to get a response.  Please allow 48 business hours for a response.  Please remember that this is for non-urgent requests.  _______________________________________________________  [facility] is using a team-based approach to care.  Your team is made up of your doctor and two to three APPS. Our APPS (Nurse Practitioners and Physician Assistants) work with your physician to ensure care continuity for you. They are fully qualified to address your health concerns and develop a treatment plan. They communicate directly with your gastroenterologist to care for you. Seeing the Advanced Practice Practitioners on your physician's team can help you by facilitating care more promptly, often allowing for earlier appointments, access to diagnostic testing, procedures, and other specialty referrals.   Due to recent changes in healthcare laws, you may see the results of your imaging and laboratory studies on MyChart before your provider has had a chance to review them.  We understand that in some cases there may be results that are confusing or concerning to you. Not all laboratory results come back in the same time frame and the provider may be waiting for multiple results in order to interpret others.  Please give us  48 hours in order for your provider to thoroughly review all the results before contacting the office for clarification of your results.   Thank you for choosing me and [facility].  Camie Furbish, PA-C

## 2024-04-14 ENCOUNTER — Telehealth: Payer: Self-pay | Admitting: Gastroenterology

## 2024-04-14 MED ORDER — LINACLOTIDE 145 MCG PO CAPS: 145.0000 ug | ORAL_CAPSULE | Freq: Every day | ORAL | 3 refills | Status: DC

## 2024-04-14 NOTE — Telephone Encounter (Signed)
 Patient called stating linzess  has been working for her and would like a prescription called into her pharmacy. Please advise, thank you

## 2024-04-14 NOTE — Telephone Encounter (Signed)
 Lauren [name], okay to send prescription for Linzess  145 mcg once daily to pharmacy?

## 2024-04-14 NOTE — Telephone Encounter (Signed)
 Prescription for Linzess  145 mcg once daily has been sent.   PRESCRIPTION MEDICATION(S):   We have sent the following medication(s) to your pharmacy:  Linzess    NOTE: If your medication(s) requires a PRIOR AUTHORIZATION, we will receive notification from your pharmacy. Once received, the process to submit for approval may take up to 7-10 business days. You will be contacted about any denials we have received from your insurance company as well as alternatives recommended by your provider.

## 2024-04-23 ENCOUNTER — Ambulatory Visit: Admitting: Internal Medicine

## 2024-04-23 ENCOUNTER — Encounter: Payer: Self-pay | Admitting: Internal Medicine

## 2024-04-23 VITALS — BP 141/66 | HR 70 | Temp 96.8°F | Resp 15 | Ht 64.0 in | Wt 131.0 lb

## 2024-04-23 DIAGNOSIS — K5730 Diverticulosis of large intestine without perforation or abscess without bleeding: Secondary | ICD-10-CM | POA: Diagnosis not present

## 2024-04-23 DIAGNOSIS — Q439 Congenital malformation of intestine, unspecified: Secondary | ICD-10-CM

## 2024-04-23 DIAGNOSIS — L290 Pruritus ani: Secondary | ICD-10-CM

## 2024-04-23 DIAGNOSIS — K56699 Other intestinal obstruction unspecified as to partial versus complete obstruction: Secondary | ICD-10-CM

## 2024-04-23 DIAGNOSIS — R194 Change in bowel habit: Secondary | ICD-10-CM

## 2024-04-23 MED ORDER — LUBIPROSTONE 24 MCG PO CAPS: 24.0000 ug | ORAL_CAPSULE | Freq: Two times a day (BID) | ORAL | 5 refills | Status: AC

## 2024-04-23 MED ORDER — SODIUM CHLORIDE 0.9 % IV SOLN: 500.0000 mL | INTRAVENOUS | Status: DC

## 2024-04-23 NOTE — Progress Notes (Signed)
 Sedate, gd SR, tolerated procedure well, VSS, report to RN

## 2024-04-23 NOTE — Progress Notes (Signed)
 History and Physical Interval Note:  04/23/2024 10:52 AM  Lauren [name]  has presented today for endoscopic procedure(s), with the diagnosis of  Encounter Diagnosis  Name Primary?   Change in bowel habits Yes  .  The various methods of evaluation and treatment have been discussed with the patient and/or family. After consideration of risks, benefits and other options for treatment, the patient has consented to  the endoscopic procedure(s).   The patient's history has been reviewed, patient examined, no change in status, stable for endoscopic procedure(s).  I have reviewed the patient's chart and labs.  Questions were answered to the patient's satisfaction.     [name], MD, NOLIA

## 2024-04-23 NOTE — Progress Notes (Signed)
 Pt's states no medical or surgical changes since previsit or office visit.

## 2024-04-23 NOTE — Op Note (Addendum)
 [facility] Patient Name: Lauren [name] Procedure Date: 04/23/2024 10:48 AM MRN: [id number] Endoscopist: Lupita FORBES Commander , MD, [id number] Age: 78 Referring MD:  Date of Birth: [birth year] Gender: Female Account #: [id number] Procedure:                Colonoscopy Indications:              Change in bowel habits, worsening constipation Medicines:                Monitored Anesthesia Care Procedure:                Pre-Anesthesia Assessment:                           - Prior to the procedure, a History and Physical                            was performed, and patient medications and                            allergies were reviewed. The patient's tolerance of                            previous anesthesia was also reviewed. The risks                            and benefits of the procedure and the sedation                            options and risks were discussed with the patient.                            All questions were answered, and informed consent                            was obtained. Prior Anticoagulants: The patient has                            taken no anticoagulant or antiplatelet agents. ASA                            Grade Assessment: III - A patient with severe                            systemic disease. After reviewing the risks and                            benefits, the patient was deemed in satisfactory                            condition to undergo the procedure.                           After obtaining informed consent, the colonoscope  was passed under direct vision. Throughout the                            procedure, the patient's blood pressure, pulse, and                            oxygen saturations were monitored continuously. The                            [id number]-HQ190L Colonoscope [id number] was introduced                            through the anus and advanced to the the cecum,                            identified  by appendiceal orifice and ileocecal                            valve. The colonoscopy was somewhat difficult due                            to bowel stenosis and a tortuous colon. Successful                            completion of the procedure was aided by lavage.                            The patient tolerated the procedure well. The                            quality of the bowel preparation was adequate. The                            bowel preparation used was SUPREP via split dose                            instruction. The ileocecal valve, appendiceal                            orifice, and rectum were photographed. Scope In: 10:59:10 AM Scope Out: 11:18:46 AM Scope Withdrawal Time: 0 hours 13 minutes 43 seconds  Total Procedure Duration: 0 hours 19 minutes 36 seconds  Findings:                 The perianal exam findings include a perianal rash.                           The digital rectal exam was normal.                           A benign-appearing, intrinsic severe stenosis was                            found in the sigmoid colon and was traversed.  Multiple large-mouthed, medium-mouthed and                            small-mouthed diverticula were found in the sigmoid                            colon, descending colon and transverse colon. There                            was narrowing of the colon in association with the                            diverticular opening.                           The exam was otherwise without abnormality on                            direct and retroflexion views. Complications:            No immediate complications. Estimated Blood Loss:     Estimated blood loss: none. Impression:               - Perianal rash found on perianal exam.                           - Stricture in the sigmoid colon. diverticular                            stricture - angulated and stenotic - required                            lavage and  torsion of scope to navigate                           - Severe diverticulosis in the sigmoid colon, less                            soin the descending colon and in the transverse                            colon. There was narrowing of the colon in                            association with the diverticular opening.- sigmoid                            as above                           - The examination was otherwise normal on direct                            and retroflexion views.                           -  No specimens collected. Recommendation:           - Patient has a contact number available for                            emergencies. The signs and symptoms of potential                            delayed complications were discussed with the                            patient. Return to normal activities tomorrow.                            Written discharge instructions were provided to the                            patient.                           - High fiber diet.                           - Also use fiber supplements, MiraLax                            Will change Linzess  (unaffordable) to Amitiza 24                            mcg bid                           Consider elective surgical resection of stenotic                            sigmoid                           No routine repeat colonoscopy. Lupita FORBES Commander, MD 04/23/2024 11:31:30 AM This report has been signed electronically.

## 2024-04-23 NOTE — Patient Instructions (Addendum)
 I believe the cause of your problem is severe diverticulosis.  I have discontinued Linzess  and prescribe something called Amitiza or lubiprostone.  Hopefully that will be affordable.  If it is in its effective please continue that.  I do think it is reasonable to consider surgery to remove part of your colon which is narrowed and full of diverticulosis.  If you are interested I can make a referral.  No polyps or cancer were seen.  You do have some sort of a rash on the perianal area.  It is probably nothing significant but it could be evaluated by a dermatologist.  I appreciate the opportunity to care for you. [name], MD, FACG  YOU HAD AN ENDOSCOPIC PROCEDURE TODAY AT THE [facility]:   Refer to the procedure report that was given to you for any specific questions about what was found during the examination.  If the procedure report does not answer your questions, please call your gastroenterologist to clarify.  If you requested that your care partner not be given the details of your procedure findings, then the procedure report has been included in a sealed envelope for you to review at your convenience later.  YOU SHOULD EXPECT: Some feelings of bloating in the abdomen. Passage of more gas than usual.  Walking can help get rid of the air that was put into your GI tract during the procedure and reduce the bloating. If you had a lower endoscopy (such as a colonoscopy or flexible sigmoidoscopy) you may notice spotting of blood in your stool or on the toilet paper. If you underwent a bowel prep for your procedure, you may not have a normal bowel movement for a few days.  Please Note:  You might notice some irritation and congestion in your nose or some drainage.  This is from the oxygen used during your procedure.  There is no need for concern and it should clear up in a day or so.  SYMPTOMS TO REPORT IMMEDIATELY:  Following lower endoscopy (colonoscopy or flexible  sigmoidoscopy):  Excessive amounts of blood in the stool  Significant tenderness or worsening of abdominal pains  Swelling of the abdomen that is new, acute  Fever of 100F or higher  For urgent or emergent issues, a gastroenterologist can be reached at any hour by calling [phone]. Do not use MyChart messaging for urgent concerns.    DIET:  We do recommend a small meal at first, but then you may proceed to your regular diet.  Drink plenty of fluids but you should avoid alcoholic beverages for 24 hours.  ACTIVITY:  You should plan to take it easy for the rest of today and you should NOT DRIVE or use heavy machinery until tomorrow (because of the sedation medicines used during the test).    FOLLOW UP: Our staff will call the number listed on your records the next business day following your procedure.  We will call around 7:15- 8:00 am to check on you and address any questions or concerns that you may have regarding the information given to you following your procedure. If we do not reach you, we will leave a message.     If any biopsies were taken you will be contacted by phone or by letter within the next 1-3 weeks.  Please call us  at [phone] if you have not heard about the biopsies in 3 weeks.    SIGNATURES/CONFIDENTIALITY: You and/or your care partner have signed paperwork which will be  entered into your electronic medical record.  These signatures attest to the fact that that the information above on your After Visit Summary has been reviewed and is understood.  Full responsibility of the confidentiality of this discharge information lies with you and/or your care-partner.

## 2024-04-24 ENCOUNTER — Telehealth: Payer: Self-pay | Admitting: *Deleted

## 2024-04-24 ENCOUNTER — Ambulatory Visit: Payer: Self-pay | Admitting: Internal Medicine

## 2024-04-24 NOTE — Telephone Encounter (Signed)
 Patient returning call in regards to concerns and symptoms she is having . Please advise.

## 2024-04-24 NOTE — Telephone Encounter (Signed)
 Left message to call back.

## 2024-04-24 NOTE — Telephone Encounter (Signed)
  Follow up Call-     04/23/2024   10:13 AM  Call back number  Post procedure Call Back phone  # [phone]  Permission to leave phone message Yes   Left message on machine to call back with any questions or concerns

## 2024-04-24 NOTE — Telephone Encounter (Signed)
 Patient returning call.

## 2024-04-24 NOTE — Telephone Encounter (Signed)
 Attempted to reach pt again. Phone rings once and goes to voicemail. Pt must have a setting on her phone not allowing our calls to go through. Requested pt to call back and ask answering service to put her straight though to a nurse to discuss her concerns.

## 2024-04-24 NOTE — Telephone Encounter (Signed)
 Have attempted to reach patient x3 more times.  No answer, left message on machine to call back if any issues

## 2024-04-24 NOTE — Telephone Encounter (Signed)
 Pt called back and states she would like to be referred to a surgeon for her diverticulosis.  I will forward this to Dr. Darilyn pod

## 2024-04-24 NOTE — Telephone Encounter (Signed)
 Attempted to reach, no answer.  Left message to call office back

## 2024-05-10 ENCOUNTER — Encounter (HOSPITAL_COMMUNITY): Payer: Self-pay

## 2024-05-10 ENCOUNTER — Emergency Department (HOSPITAL_COMMUNITY)
Admission: EM | Admit: 2024-05-10 | Discharge: 2024-05-10 | Disposition: A | Discharge status: Home / Self Care | Attending: Emergency Medicine | Admitting: Emergency Medicine

## 2024-05-10 ENCOUNTER — Emergency Department (HOSPITAL_COMMUNITY)

## 2024-05-10 ENCOUNTER — Other Ambulatory Visit: Payer: Self-pay

## 2024-05-10 DIAGNOSIS — Z79890 Hormone replacement therapy (postmenopausal): Secondary | ICD-10-CM | POA: Diagnosis not present

## 2024-05-10 DIAGNOSIS — K5900 Constipation, unspecified: Secondary | ICD-10-CM | POA: Diagnosis present

## 2024-05-10 DIAGNOSIS — Z7982 Long term (current) use of aspirin: Secondary | ICD-10-CM | POA: Diagnosis not present

## 2024-05-10 DIAGNOSIS — I10 Essential (primary) hypertension: Secondary | ICD-10-CM | POA: Diagnosis not present

## 2024-05-10 DIAGNOSIS — E039 Hypothyroidism, unspecified: Secondary | ICD-10-CM | POA: Diagnosis not present

## 2024-05-10 DIAGNOSIS — K5730 Diverticulosis of large intestine without perforation or abscess without bleeding: Secondary | ICD-10-CM | POA: Insufficient documentation

## 2024-05-10 DIAGNOSIS — K5790 Diverticulosis of intestine, part unspecified, without perforation or abscess without bleeding: Secondary | ICD-10-CM

## 2024-05-10 LAB — COMPREHENSIVE METABOLIC PANEL WITH GFR
ALT: 13 U/L (ref 0–44)
AST: 25 U/L (ref 15–41)
Albumin: 4.2 g/dL (ref 3.5–5.0)
Alkaline Phosphatase: 95 U/L (ref 38–126)
Anion gap: 10 (ref 5–15)
BUN: 13 mg/dL (ref 8–23)
CO2: 27 mmol/L (ref 22–32)
Calcium: 9.8 mg/dL (ref 8.9–10.3)
Chloride: 102 mmol/L (ref 98–111)
Creatinine, Ser: 0.72 mg/dL (ref 0.44–1.00)
GFR, Estimated: >60 mL/min
Glucose, Bld: 155 mg/dL — ABNORMAL HIGH (ref 70–99)
Potassium: 4.2 mmol/L (ref 3.5–5.1)
Sodium: 139 mmol/L (ref 135–145)
Total Bilirubin: 0.6 mg/dL (ref 0.0–1.2)
Total Protein: 6.9 g/dL (ref 6.5–8.1)

## 2024-05-10 LAB — CBC
HCT: 39.2 % (ref 36.0–46.0)
Hemoglobin: 12.8 g/dL (ref 12.0–15.0)
MCH: 32.4 pg (ref 26.0–34.0)
MCHC: 32.7 g/dL (ref 30.0–36.0)
MCV: 99.2 fL (ref 80.0–100.0)
Platelets: 233 K/uL (ref 150–400)
RBC: 3.95 MIL/uL (ref 3.87–5.11)
RDW: 13.1 % (ref 11.5–15.5)
WBC: 6.4 K/uL (ref 4.0–10.5)
nRBC: 0 % (ref 0.0–0.2)

## 2024-05-10 LAB — LIPASE, BLOOD: Lipase: 45 U/L (ref 11–51)

## 2024-05-10 MED ORDER — IOHEXOL 350 MG/ML SOLN
75.0000 mL | Freq: Once | INTRAVENOUS | Status: AC | PRN
  Administered 2024-05-10: 75 mL via INTRAVENOUS

## 2024-05-10 NOTE — ED Provider Notes (Signed)
 " [place] EMERGENCY DEPARTMENT AT [facility] Provider Note   CSN: [id number] Arrival date & time: 05/10/24  9141     Patient presents with: Constipation   Lauren [name] is a 78 y.o. female with PMHx hypothyroidism, OA, diverticulitis, headaches, HTN who presents to ED concerned for abdominal pain and constipation over the past 2 weeks. Patient is taking bowel regimen as prescribed, but states that BM's feel incomplete and are very tiny. Last tiny BM was this morning.  Patient denies fever, nausea, vomiting, hematochezia, dysuria, hematuria.  Patient stating that symptoms today are similar to prior diverticulitis flares.    Constipation      Prior to Admission medications  Medication Sig Start Date End Date Taking? Authorizing Provider  aspirin EC 81 MG tablet Take 81 mg by mouth daily. Swallow whole.    [provider]  cholecalciferol (VITAMIN D3) 25 MCG (1000 UNIT) tablet Take 1,000 Units by mouth daily.    [provider]  ezetimibe  (ZETIA ) 10 MG tablet TAKE 1 TABLET(10 MG) BY MOUTH DAILY 12/06/23   Norleen Lynwood ORN, MD  famotidine  (PEPCID ) 40 MG tablet Take 40 mg by mouth daily. 01/17/22   [provider]  hydrochlorothiazide  (MICROZIDE ) 12.5 MG capsule TAKE 1 CAPSULE BY MOUTH DAILY 11/28/23   Norleen Lynwood ORN, MD  levothyroxine  (SYNTHROID ) 50 MCG tablet Take 1 tablet (50 mcg total) by mouth daily. 03/14/24   Norleen Lynwood ORN, MD  lubiprostone  (AMITIZA ) 24 MCG capsule Take 1 capsule (24 mcg total) by mouth 2 (two) times daily with a meal. 04/23/24   Avram Lupita BRAVO, MD  RESTASIS  0.05 % ophthalmic emulsion Place 1 drop into both eyes 2 (two) times daily. 04/18/19   [provider]  zolpidem  (AMBIEN ) 5 MG tablet Take 1 tablet (5 mg total) by mouth at bedtime as needed. for sleep 02/14/24   Norleen Lynwood ORN, MD    Allergies: Celebrex [celecoxib]    Review of Systems  Gastrointestinal:  Positive for constipation.    Updated Vital Signs BP  (!) 165/54 (BP Location: Right Arm)   Pulse 95   Temp 98.4 F (36.9 C)   Resp 18   Ht 5' 4 (1.626 m)   Wt 59.4 kg   LMP 05/22/1996   SpO2 100%   BMI 22.49 kg/m   Physical Exam Vitals and nursing note reviewed.  Constitutional:      General: She is not in acute distress.    Appearance: She is not ill-appearing or toxic-appearing.  HENT:     Head: Normocephalic and atraumatic.     Mouth/Throat:     Mouth: Mucous membranes are moist.  Eyes:     General: No scleral icterus.       Right eye: No discharge.        Left eye: No discharge.     Conjunctiva/sclera: Conjunctivae normal.  Cardiovascular:     Rate and Rhythm: Normal rate and regular rhythm.     Pulses: Normal pulses.     Heart sounds: No murmur heard. Pulmonary:     Effort: Pulmonary effort is normal. No respiratory distress.     Breath sounds: Normal breath sounds. No wheezing, rhonchi or rales.  Abdominal:     General: Abdomen is flat. Bowel sounds are normal. There is no distension.     Palpations: Abdomen is soft. There is no mass.     Tenderness: There is abdominal tenderness.     Comments: LLQ tenderness  Musculoskeletal:  Right lower leg: No edema.     Left lower leg: No edema.  Skin:    General: Skin is warm and dry.     Findings: No rash.  Neurological:     General: No focal deficit present.     Mental Status: She is alert and oriented to person, place, and time. Mental status is at baseline.  Psychiatric:        Mood and Affect: Mood normal.        Behavior: Behavior normal.     (all labs ordered are listed, but only abnormal results are displayed) Labs Reviewed  COMPREHENSIVE METABOLIC PANEL WITH GFR - Abnormal; Notable for the following components:      Result Value   Glucose, Bld 155 (*)    All other components within normal limits  LIPASE, BLOOD  CBC    EKG: None  Radiology: CT ABDOMEN PELVIS W CONTRAST Result Date: 05/10/2024 CLINICAL DATA:  Provided right history: Bowel  obstruction suspected Patient reports constipation for 2 weeks.  Recent colonoscopy. EXAM: CT ABDOMEN AND PELVIS WITH CONTRAST TECHNIQUE: Multidetector CT imaging of the abdomen and pelvis was performed using the standard protocol following bolus administration of intravenous contrast. RADIATION DOSE REDUCTION: This exam was performed according to the departmental dose-optimization program which includes automated exposure control, adjustment of the mA and/or kV according to patient size and/or use of iterative reconstruction technique. CONTRAST:  75mL OMNIPAQUE  IOHEXOL  350 MG/ML SOLN COMPARISON:  06/30/2019 FINDINGS: Lower chest: Clear lung bases. Irregular atherosclerosis of the descending thoracic aorta. Hepatobiliary: No focal hepatic lesion. Post cholecystectomy with stable biliary prominence. No visualized choledocholithiasis. Pancreas: No ductal dilatation or inflammation. Spleen: Normal in size without focal abnormality. Adrenals/Urinary Tract: No adrenal nodule. Prominence of the right renal pelvis and renal collecting system, but no renal inflammation or obstructing stone. There is homogeneous renal enhancement and symmetric excretion on delayed phase imaging. No suspicious renal lesion. Urinary bladder is moderately distended without wall thickening. Stomach/Bowel: No bowel obstruction. Small hiatal hernia. The stomach is nondistended. Small duodenal diverticulum. Fluid-filled distal small bowel without abnormal distension, inflammation or wall thickening. The appendix is normal. Fluid/liquid stool within the ascending colon. Moderate formed stool within the transverse, descending, and sigmoid colon. Descending and sigmoid colonic diverticulosis. No definite diverticulitis. Short segment colonic wall thickening in the sigmoid versus nondistention, series 3, image 69. No adjacent pericolonic fat stranding. Vascular/Lymphatic: Aortic atherosclerosis. No aortic aneurysm. Patent portal and splenic veins. No  suspicious lymphadenopathy. Reproductive: Status post hysterectomy. No adnexal masses. Other: No free air, free fluid, or intra-abdominal fluid collection. Tiny fat containing umbilical hernia. Musculoskeletal: Mild lower lumbar facet hypertrophy. There are no acute or suspicious osseous abnormalities. IMPRESSION: 1. No bowel obstruction. 2. Short segment colonic wall thickening in the sigmoid versus nondistention. No adjacent pericolonic fat stranding. Recommend correlation with recent colonoscopy. 3. Colonic diverticulosis without diverticulitis. 4. Prominence of the right renal pelvis and renal collecting system, but no renal inflammation or obstructing stone. This may be due to bladder distension. Aortic Atherosclerosis (ICD10-I70.0). Electronically Signed   By: Andrea Gasman M.D.   On: 05/10/2024 13:55     Procedures   Medications Ordered in the ED  iohexol  (OMNIPAQUE ) 350 MG/ML injection 75 mL (75 mLs Intravenous Contrast Given 05/10/24 1338)    Clinical Course as of 05/10/24 1425  Sat May 10, 2024  1425 RN stating that patient requested to take soap suds enema home and try it by herself [SM]    Clinical Course User  Index [SM] Hoy Nidia FALCON, PA-C                                 Medical Decision Making Amount and/or Complexity of Data Reviewed Labs: ordered. Radiology: ordered.  Risk Prescription drug management.    This patient presents to the ED for concern of abdominal pain, this involves an extensive number of treatment options, and is a complaint that carries with it a high risk of complications and morbidity.  The differential diagnosis includes gastroenteritis, colitis, small bowel obstruction, appendicitis, cholecystitis, pancreatitis, nephrolithiasis, UTI, pyelonephritis   Co morbidities that complicate the patient evaluation  See HPI above   Additional history obtained:  Additional history obtained from 04/2024 colonoscopy: Benign-appearing, intrinsic  severe stenosis were found in the sigmoid colon and was transversed.  Multiple largemouth, medium mouth and small mouth diverticula were found in the sigmoid colon, descending colon and transverse colon.  There was narrowing of the colon in association with the diverticular opening.  The exam was otherwise with abnormality on direct and retroflexion views.   Problem List / ED Course / Critical interventions / Medication management  Patient presented for abdominal pain and constipation over the past 2 weeks.  Last BM was this morning but was very tiny and felt incomplete.  Physical exam with LLQ tenderness to palpation.  Rest of physical exam reassuring.  Patient afebrile with stable vitals. I Ordered, and personally interpreted labs.  CMP with mild hyperglycemia at 135.  CBC without leukocytosis or anemia.  Lipase within normal limits. I ordered imaging studies including CT Abd/Pelvis with contrast: evaluate for structural/surgical etiology of patients' severe abdominal pain. I independently visualized and interpreted imaging and I agree with the radiologist interpretation of diverticulosis.  CT scan also showing distended urinary bladder, patient stating that she was able to fully empty her bladder after the CT scan. Shared all results with patient.  Answered all questions.  Patient stating that she has tried multiple regimens at home including fleet enema, but would like to try soapsuds enema while she is here in ED to see if that helps clean her out a little bit more.  Will attempt this, reassess, and hopefully discharge home.  Patient agreeable with plan. Staffed with Dr. Darra who agrees with plan. I have reviewed the patients home medicines and have made adjustments as needed The patient has been appropriately medically screened and/or stabilized in the ED. I have low suspicion for any other emergent medical condition which would require further screening, evaluation or treatment in the ED or require  inpatient management. At time of discharge the patient is hemodynamically stable and in no acute distress. I have discussed work-up results and diagnosis with patient and answered all questions. Patient is agreeable with discharge plan. We discussed strict return precautions for returning to the emergency department and they verbalized understanding.     Social Determinants of Health:  none       Final diagnoses:  Constipation, unspecified constipation type  Diverticulosis    ED Discharge Orders     None          Hoy Nidia FALCON, [place] 05/10/24 1416  "

## 2024-05-10 NOTE — ED Triage Notes (Signed)
 Pt reports constipation for the past 2 weeks, states she had a tiny BM this morning but it was hard. Taking multiple OTC medications without relief. Pt had a colonoscopy on 12/3 that showed diverticulosis and was referred ro a surgeon- pt has appointment next month.

## 2024-05-10 NOTE — Discharge Instructions (Addendum)
 Please follow-up with your primary care provider.  Seek emergency care if experiencing any new or worsening symptoms.

## 2024-05-18 ENCOUNTER — Emergency Department (HOSPITAL_COMMUNITY)

## 2024-05-18 ENCOUNTER — Other Ambulatory Visit: Payer: Self-pay

## 2024-05-18 ENCOUNTER — Emergency Department (HOSPITAL_COMMUNITY)
Admission: EM | Admit: 2024-05-18 | Discharge: 2024-05-18 | Disposition: A | Discharge status: Home / Self Care | Attending: Emergency Medicine | Admitting: Emergency Medicine

## 2024-05-18 DIAGNOSIS — D72829 Elevated white blood cell count, unspecified: Secondary | ICD-10-CM | POA: Diagnosis not present

## 2024-05-18 DIAGNOSIS — Z981 Arthrodesis status: Secondary | ICD-10-CM | POA: Insufficient documentation

## 2024-05-18 DIAGNOSIS — W19XXXA Unspecified fall, initial encounter: Secondary | ICD-10-CM

## 2024-05-18 DIAGNOSIS — S0012XA Contusion of left eyelid and periocular area, initial encounter: Secondary | ICD-10-CM | POA: Diagnosis not present

## 2024-05-18 DIAGNOSIS — S0592XA Unspecified injury of left eye and orbit, initial encounter: Secondary | ICD-10-CM | POA: Diagnosis present

## 2024-05-18 DIAGNOSIS — W06XXXA Fall from bed, initial encounter: Secondary | ICD-10-CM | POA: Diagnosis not present

## 2024-05-18 DIAGNOSIS — S0990XA Unspecified injury of head, initial encounter: Secondary | ICD-10-CM | POA: Diagnosis present

## 2024-05-18 DIAGNOSIS — Z7982 Long term (current) use of aspirin: Secondary | ICD-10-CM | POA: Insufficient documentation

## 2024-05-18 DIAGNOSIS — S0512XA Contusion of eyeball and orbital tissues, left eye, initial encounter: Secondary | ICD-10-CM

## 2024-05-18 LAB — BASIC METABOLIC PANEL WITH GFR
Anion gap: 9 (ref 5–15)
BUN: 13 mg/dL (ref 8–23)
CO2: 29 mmol/L (ref 22–32)
Calcium: 9.3 mg/dL (ref 8.9–10.3)
Chloride: 103 mmol/L (ref 98–111)
Creatinine, Ser: 0.73 mg/dL (ref 0.44–1.00)
GFR, Estimated: >60 mL/min
Glucose, Bld: 101 mg/dL — ABNORMAL HIGH (ref 70–99)
Potassium: 4.3 mmol/L (ref 3.5–5.1)
Sodium: 141 mmol/L (ref 135–145)

## 2024-05-18 LAB — CBC
HCT: 38.7 % (ref 36.0–46.0)
Hemoglobin: 12.7 g/dL (ref 12.0–15.0)
MCH: 31.9 pg (ref 26.0–34.0)
MCHC: 32.8 g/dL (ref 30.0–36.0)
MCV: 97.2 fL (ref 80.0–100.0)
Platelets: 243 K/uL (ref 150–400)
RBC: 3.98 MIL/uL (ref 3.87–5.11)
RDW: 13.1 % (ref 11.5–15.5)
WBC: 5.2 K/uL (ref 4.0–10.5)
nRBC: 0 % (ref 0.0–0.2)

## 2024-05-18 MED ORDER — FLUORESCEIN SODIUM 1 MG OP STRP
1.0000 | ORAL_STRIP | Freq: Once | OPHTHALMIC | Status: AC
  Administered 2024-05-18: 1 via OPHTHALMIC
  Filled 2024-05-18: qty 1

## 2024-05-18 MED ORDER — FENTANYL CITRATE (PF) 50 MCG/ML IJ SOSY
25.0000 ug | PREFILLED_SYRINGE | Freq: Once | INTRAMUSCULAR | Status: AC
  Administered 2024-05-18: 25 ug via INTRAVENOUS
  Filled 2024-05-18: qty 1

## 2024-05-18 MED ORDER — TETRACAINE HCL 0.5 % OP SOLN
2.0000 [drp] | Freq: Once | OPHTHALMIC | Status: AC
  Administered 2024-05-18: 2 [drp] via OPHTHALMIC
  Filled 2024-05-18: qty 4

## 2024-05-18 NOTE — ED Provider Notes (Signed)
" °  [place] EMERGENCY DEPARTMENT AT [facility] Provider Note   CSN: [id number] Arrival date & time: 05/18/24  9381     Patient presents with: Lauren [name] is a 78 y.o. female.  {Add pertinent medical, surgical, social history, OB history to YEP:67052}  Fall       Prior to Admission medications  Medication Sig Start Date End Date Taking? Authorizing Provider  aspirin EC 81 MG tablet Take 81 mg by mouth daily. Swallow whole.    [provider]  cholecalciferol (VITAMIN D3) 25 MCG (1000 UNIT) tablet Take 1,000 Units by mouth daily.    [provider]  ezetimibe  (ZETIA ) 10 MG tablet TAKE 1 TABLET(10 MG) BY MOUTH DAILY 12/06/23   Norleen Lynwood ORN, MD  famotidine  (PEPCID ) 40 MG tablet Take 40 mg by mouth daily. 01/17/22   [provider]  hydrochlorothiazide  (MICROZIDE ) 12.5 MG capsule TAKE 1 CAPSULE BY MOUTH DAILY 11/28/23   Norleen Lynwood ORN, MD  levothyroxine  (SYNTHROID ) 50 MCG tablet Take 1 tablet (50 mcg total) by mouth daily. 03/14/24   Norleen Lynwood ORN, MD  lubiprostone  (AMITIZA ) 24 MCG capsule Take 1 capsule (24 mcg total) by mouth 2 (two) times daily with a meal. 04/23/24   Avram Lupita BRAVO, MD  RESTASIS  0.05 % ophthalmic emulsion Place 1 drop into both eyes 2 (two) times daily. 04/18/19   [provider]  zolpidem  (AMBIEN ) 5 MG tablet Take 1 tablet (5 mg total) by mouth at bedtime as needed. for sleep 02/14/24   Norleen Lynwood ORN, MD    Allergies: Celebrex [celecoxib]    Review of Systems  Updated Vital Signs BP (!) 144/76 (BP Location: Right Arm)   Pulse 73   Temp 98.2 F (36.8 C)   Resp 18   Ht 5' 4 (1.626 m)   Wt 59 kg   LMP 05/22/1996   SpO2 96%   BMI 22.33 kg/m   Physical Exam  (all labs ordered are listed, but only abnormal results are displayed) Labs Reviewed - No data to display  EKG: None  Radiology: No results found.  {Document cardiac monitor, telemetry assessment procedure when  appropriate:32947} Procedures   Medications Ordered in the ED  fentaNYL  (SUBLIMAZE ) injection 25 mcg (has no administration in time range)  fluorescein  ophthalmic strip 1 strip (has no administration in time range)  tetracaine  (PONTOCAINE) 0.5 % ophthalmic solution 2 drop (has no administration in time range)      {Click here for ABCD2, HEART and other calculators REFRESH Note before signing:1}                              Medical Decision Making Amount and/or Complexity of Data Reviewed Labs: ordered. Radiology: ordered.  Risk Prescription drug management.   ***  {Document critical care time when appropriate  Document review of labs and clinical decision tools ie CHADS2VASC2, etc  Document your independent review of radiology images and any outside records  Document your discussion with family members, caretakers and with consultants  Document social determinants of health affecting pt's care  Document your decision making why or why not admission, treatments were needed:32947:::1}   Final diagnoses:  None    ED Discharge Orders     None        "

## 2024-05-18 NOTE — ED Notes (Signed)
 Visual Acuity: R 20/32. B 20/25. Pt was not able to see with just the left eye, states its blurry

## 2024-05-18 NOTE — Discharge Instructions (Addendum)
 You were seen in the ER today for evaluation after your fall. Your CT imaging did not show any new findings. I would like for you to see the eye specialist. I spoke with her earlier and she will see you at 11:45AM. I have included her address. Please drive straight over. I have included some information into your discharge paperwork for you to review. If you have any concerns, new or worsening symptoms, please return to the ER for re-evaluation.   Contact a doctor if: These symptoms do not go away: Headaches. Dizziness. Double vision or vision changes. Trouble sleeping. Changes in mood. You have new symptoms. Get help right away if: You have sudden: Headache that is very bad. Vomiting that does not stop. Changes in the size of one of your pupils. Pupils are the black centers of your eyes. Changes in how you see (vision). More confusion or more grumpy moods. You have a seizure. Your symptoms get worse. You have a clear or bloody fluid coming from your nose or ears. These symptoms may be an emergency. Get help right away. Call 911. Do not wait to see if the symptoms will go away. Do not drive yourself to the hospital.

## 2024-05-18 NOTE — ED Notes (Signed)
 Patient transported to CT

## 2024-05-18 NOTE — ED Triage Notes (Signed)
 Pt BIB EMS from home d/t a fall out of bed. Pt hit her left eye on her bedside table. Pt denies LOC, and is A&Ox4. Pt not on blood thinners. Obvious swelling noted to the left eye.

## 2024-05-26 ENCOUNTER — Encounter: Admitting: Student in an Organized Health Care Education/Training Program

## 2024-05-26 ENCOUNTER — Ambulatory Visit: Payer: Self-pay | Admitting: General Surgery

## 2024-05-26 NOTE — H&P (Signed)
 " REFERRING PHYSICIAN:  Avram Lupita BRAVO, MD  PROVIDER:  BERNARDA WANDA NED, MD  MRN: [id number] DOB: 1945/12/15 DATE OF ENCOUNTER: 05/26/2024  Subjective   Chief Complaint: New Consultation ( NEW PATIENT - sigmoid colon stricture)     History of Present Illness: Lauren [name] is a 79 y.o. female who is seen today as an office consultation at the request of Dr. Avram for evaluation of New Consultation ( NEW PATIENT - sigmoid colon stricture) .  Patient with a history of recurrent diverticulitis.  Her most recent episode was in 2021.  Over the past few months she has developed a change in bowel habits with bloating.  She has a longstanding history of constipation at baseline but her current symptoms are more severe.  She is using a MiraLAX  laxative twice a day.  Colonoscopy was completed on 04/23/2024 and this did show a sigmoid stricture.  It was traversed.  No other findings were noted.  CT scan shows some colonic wall thickening in the sigmoid colon versus nondistention but otherwise normal evaluation.   Review of Systems: A complete review of systems was obtained from the patient.  I have reviewed this information and discussed as appropriate with the patient.  See HPI as well for other ROS.    Medical History: Past Medical History:  Diagnosis Date   Anxiety    Arthritis    Carotid stenosis 10/08/2013   Cervical disc disease    Depression    Diverticulitis    Diverticulosis    Frequent headaches    Hypertension    does not now per pt.   Hypothyroidism 10/26/2015   Impaired glucose tolerance 10/08/2013   normal HgA1C   Neck pain    Psoriasis      Past Surgical History:  Procedure Laterality Date   BREAST BIOPSY Left 2011   benign    CERVICAL FUSION     approx 1980   CHOLECYSTECTOMY     COLONOSCOPY     I & D EXTREMITY Right 03/07/2022   Procedure: IRRIGATION AND DEBRIDEMENT OF FOOT WITH BONE BIOPSY;  Surgeon: Gershon Donnice SAUNDERS, DPM;  Location: [facility] OR;   Service: Podiatry;  Laterality: Right;   POSTERIOR CERVICAL FUSION/FORAMINOTOMY N/A 11/05/2014   Procedure: Cervical four to Cervical seven  Posterior Cervical Fusion with lateral mass fixation;  Surgeon: Alm GORMAN Molt, MD;  Location: [facility];  Service: Neurosurgery;  Laterality: N/A;   C4 - C7 Posterior Cervical Fusion with lateral mass fixation   TOTAL ABDOMINAL HYSTERECTOMY  1998   Fibroid   UPPER GASTROINTESTINAL ENDOSCOPY       Allergies  Allergen Reactions   Celecoxib Itching and Rash    Current Outpatient Medications on File Prior to Visit  Medication Sig Dispense Refill   aspirin 81 MG EC tablet Take 81 mg by mouth once daily     cholecalciferol (VITAMIN D3) 1000 unit tablet Take 1,000 Units by mouth once daily     citalopram  (CELEXA ) 10 MG tablet Take 10 mg by mouth once daily     ezetimibe  (ZETIA ) 10 mg tablet Take 10 mg by mouth once daily     famotidine  (PEPCID ) 40 MG tablet Take 40 mg by mouth once daily     hydroCHLOROthiazide  (MICROZIDE ) 12.5 mg capsule Take 12.5 mg by mouth once daily     levothyroxine  (SYNTHROID ) 50 MCG tablet Take 50 mcg by mouth every morning before breakfast (0630)     lubiprostone  (AMITIZA ) 24 MCG capsule Take 24  mcg by mouth 2 (two) times daily with meals     potassium chloride  (KLOR-CON ) 10 MEQ ER tablet Take 10 mEq by mouth once daily     zolpidem  (AMBIEN ) 5 MG tablet Take 5 mg by mouth at bedtime as needed for Sleep     No current facility-administered medications on file prior to visit.    History reviewed. No pertinent family history.   Social History   Tobacco Use  Smoking Status Never  Smokeless Tobacco Never     Social History   Socioeconomic History   Marital status: Married  Tobacco Use   Smoking status: Never   Smokeless tobacco: Never  Vaping Use   Vaping status: Never Used  Substance and Sexual Activity   Alcohol use: Never   Drug use: Never   Social Drivers of Corporate Investment Banker Strain: Low Risk  (01/07/2024)   Received from [facility]   Overall Financial Resource Strain (CARDIA)    How hard is it for you to pay for the very basics like food, housing, medical care, and heating?: Not very hard  Food Insecurity: No Food Insecurity (01/07/2024)   Received from [facility]   Hunger Vital Sign    Within the past 12 months, you worried that your food would run out before you got the money to buy more.: Never true    Within the past 12 months, the food you bought just didn't last and you didn't have money to get more.: Never true  Transportation Needs: No Transportation Needs (01/07/2024)   Received from [facility] - Transportation    In the past 12 months, has lack of transportation kept you from medical appointments or from getting medications?: No    In the past 12 months, has lack of transportation kept you from meetings, work, or from getting things needed for daily living?: No  Physical Activity: Insufficiently Active (01/07/2024)   Received from [facility]   Exercise Vital Sign    On average, how many days per week do you engage in moderate to strenuous exercise (like a brisk walk)?: 2 days    On average, how many minutes do you engage in exercise at this level?: 40 min  Stress: No Stress Concern Present (01/07/2024)   Received from [facility] - Occupational Stress Questionnaire    Do you feel stress - tense, restless, nervous, or anxious, or unable to sleep at night because your mind is troubled all the time - these days?: Not at all  Social Connections: Moderately Isolated (01/07/2024)   Received from [facility]   Social Connection and Isolation Panel    In a typical week, how many times do you talk on the phone with family, friends, or neighbors?: More than three times a week    How often do you attend church or religious services?: Never    Do you belong to any clubs or organizations such as church groups, unions, fraternal  or athletic groups, or school groups?: No    How often do you attend meetings of the clubs or organizations you belong to?: Never    Are you married, widowed, divorced, separated, never married, or living with a partner?: Married  Housing Stability: Unknown (05/26/2024)   Housing Stability Vital Sign    Homeless in the Last Year: No    Objective:    Vitals:   05/26/24 1559  BP: (!) 147/74  Pulse: 83  Temp: 36.7 C (98 F)  TempSrc: Temporal  SpO2: 98%  Weight: 61.8 kg (136 lb 3.2 oz)  Height: 163.8 cm (5' 4.5)  PainSc: 0-No pain     Exam Gen: NAD Abd: soft    Labs, Imaging and Diagnostic Testing: Colonoscopy report and images reviewed.  CT report and images reviewed.  Assessment and Plan:  Colon stricture (CMS/HHS-HCC)  (primary encounter diagnosis) Plan: polyethylene glycol (MIRALAX ) powder, bisacodyL        (DULCOLAX) 5 mg EC tablet, metroNIDAZOLE         (FLAGYL ) 500 MG tablet, neomycin 500 mg tablet,       ondansetron  (ZOFRAN ) 4 MG tablet 79 year old female with change in bowel habits and CT and colonoscopy showing a benign appearing internal sigmoid stricture.  I have recommended partial colectomy.  We discussed this in detail including risk and benefits of surgery.  We discussed that she will most likely return to her baseline bowel habits after she heals from surgery.  All questions were answered.  Patient wishes to proceed.  The surgery and anatomy were described to the patient as well as the risks of surgery and the possible complications.  These include: Bleeding, deep abdominal infections and possible wound complications such as hernia and infection, damage to adjacent structures, leak of surgical connections, which can lead to other surgeries and possibly an ostomy, possible need for other procedures, such as abscess drains in radiology, possible prolonged hospital stay, possible diarrhea from removal of part of the colon, possible constipation from narcotics,  possible bowel, bladder or sexual dysfunction if having rectal surgery, prolonged fatigue/weakness or appetite loss, possible early recurrence of of disease, possible complications of their medical problems such as heart disease or arrhythmias or lung problems, death (less than 1%). I believe the patient understands and wishes to proceed with the surgery.   Bernarda JAYSON Ned, MD Colon and Rectal Surgery [facility]   "

## 2024-06-15 NOTE — Progress Notes (Signed)
 The patient was identified using 2 approved identifiers. All issues noted in this document were discussed and addressed, [name]  voiced understanding and agreement with all preoperative instructions. The patient was emailed the surgery instructions per his / her request.      Patient will be coming in for her lab work, etc on Wednesday 06-18-24 at 1:30 pm   Medication Recon:  06-10-2024  Date of any COVID positive Test in last 90 days:  PCP - Lamar Cornet, MD  Cardiologist -   Chest x-ray - 02-23-2023  2v  EKG -  03-07-2022 Stress Test -  ECHO - 11-11-2015   Cardiac Cath -  CT Coronary Calcium score: 115 on 05-10-2022 ZIO monitor-   Pacemaker / ICD device []  No []  Yes   Spinal Cord Stimulator:[]  No []  Yes       History of Sleep Apnea? []  No []  Yes   CPAP used?- []  No []  Yes    Medication on DOS: famotidine  (PEPCID ), levothyroxine  (SYNTHROID ), Restasis  Eye emulsion,   Hold DOS: hydrochlorothiazide  (MICROZIDE ), lubiprostone  (AMITIZA )  Patient has: []  NO Hx DM   [x]  Pre-DM   []  DM1  []   DM2 Does the patient monitor blood sugar?   []  N/A   [x]  No []  Yes  Last A1c was:   6.1 on   03-14-2024    No meds  Blood Thinner / Instructions: Aspirin Instructions:  ASA 81mg     Activity level: Able to walk up 2 flights of stairs without becoming significantly short of breath or having chest pain?   []    Yes   []  No,  would have:  Patient can perform ADLs without assistance.  []   Yes    []  No   Comments:   Anesthesia review: HTN, GERD, Pre-DM no meds,  s/p C4 to C7 Posterior cervical fusion 2016, also in 1980?  Patient denies any S&S of respiratory illness or Covid - no shortness of breath, fever, cough or chest pain at PAT appointment.  Patient verbalized understanding and agreement to the Pre-Surgical Instructions that were given to them at this PAT appointment. Patient was also educated of the need to review these PAT instructions again prior to his/her surgery.I reviewed the  appropriate phone numbers to call if they have any and questions or concerns.

## 2024-06-15 NOTE — Patient Instructions (Addendum)
 SURGICAL WAITING ROOM VISITATION Patients having surgery or a procedure may have no more than 2 support people in the waiting area - these visitors may rotate in the visitor waiting room.   If the patient needs to stay at the hospital during part of their recovery, the visitor guidelines for inpatient rooms apply.  PRE-OP VISITATION  Pre-op nurse will coordinate an appropriate time for 1 support person to accompany the patient in pre-op.  This support person may not rotate.  This visitor will be contacted when the time is appropriate for the visitor to come back in the pre-op area.  Temporary Visitor Restrictions   Children ages 40 and under will not be able to visit patients in [facility] under most circumstances. Visitation is not restricted outside of hospitals unless noted otherwise in the [facility] and Location Specific Visitation Guidelines at :       [email or web address]  Visitors with respiratory illnesses are discouraged from visiting and should remain at home.  You are not required to quarantine at this time prior to your surgery. However, you must do this: Hand Hygiene often Do NOT share personal items Notify your provider if you are in close contact with someone who has COVID or you develop fever 100.4 or greater, new onset of sneezing, cough, sore throat, shortness of breath or body aches.  If you test positive for Covid or have been in contact with anyone that has tested positive in the last 10 days please notify you surgeon.    Your procedure is scheduled on:  Friday  06-27-2024  Report to [facility] Main Entrance: [address] entrance where the [employer] is available.   Report to admitting at:  08:30 AM  Call this number if you have any questions or problems the morning of surgery [phone]  FOLLOW ANY ADDITIONAL PRE OP INSTRUCTIONS YOU RECEIVED FROM YOUR SURGEON'S OFFICE!!!  Dulcolax 20 mg (total) - Take 4 (four) of the 5 mg Dulcolax tablets with  water  at 07:00 am the day prior to surgery.  Miralax  238 g - Mix with 64 oz Gatorade/Powerade.  Starting at 10:00 am ,Drink this gradually over the next few hours (8 oz glass every 15-30 minutes) until gone the day prior to surgery You should finish in 4 hours-6 hours.    Neomycin 1000 mg - At 2 pm, 3 pm and 10 pm after Miralax   bowel prep the day prior to surgery.  Metronidazole  1000 mg - At 2 pm, 3 pm and 10 pm after Miralax  bowel prep the day prior to surgery.   Ondansetron  (Zofran ) can be used if you have any nausea from the other bowel prep medications.  Drink plenty of clear liquids all evening to avoid getting dehydrated.   DRINK two (2) bottles of Pre-Surgery G2 drink starting at 6:00 pm the evening prior to your surgery to help prevent dehydration. Increase drinking clear fluids (see list below)          Do not eat food after Midnight the night prior to your surgery/procedure.  After Midnight you may have the following liquids until   07:45 AM DAY OF SURGERY  Clear Liquid Diet Water  Black Coffee (sugar ok, NO MILK/CREAM OR CREAMERS)  Tea (sugar ok, NO MILK/CREAM OR CREAMERS) regular and decaf                             Plain Jell-O  with no fruit (NO RED)  Fruit ices (not with fruit pulp, NO RED)                                     Popsicles (NO RED)                                                                  Juice: NO CITRUS JUICES: only apple, WHITE grape, WHITE cranberry Sports drinks like Gatorade or Powerade (NO RED)                   The day of surgery:  Drink ONE (1) Pre-Surgery G2 at   07:45 AM the morning of surgery. Drink in one sitting. Do not sip.  This drink was given to you during your hospital pre-op appointment visit. Nothing else to drink after completing the Pre-Surgery G2 : No candy, chewing gum or throat lozenges.     Oral Hygiene is also important to reduce your risk of infection.        Remember -  BRUSH YOUR TEETH THE MORNING OF SURGERY WITH YOUR REGULAR TOOTHPASTE  Do NOT smoke after Midnight the night before surgery.  STOP TAKING all Vitamins, Herbs and supplements 1 week before your surgery.   ASPIRIN- stop taking 5-7 days before your surgery or Follow guidelines of your Surgeon.   Take ONLY these medicines the morning of surgery with A SIP OF WATER : famotidine  (PEPCID ), levothyroxine  (SYNTHROID ), Restasis  Eye emulsion,   DO NOT TAKE hydrochlorothiazide  (MICROZIDE ), the morning of your surgery.  You may not have any metal on your body including hair pins, jewelry, and body piercing  Do not wear make-up, lotions, powders, perfumes or deodorant  Do not wear nail polish including gel and S&S, artificial / acrylic nails, or any other type of covering on natural nails including finger and toenails. If you have artificial nails, gel coating, etc., that needs to be removed by a nail salon, Please have this removed prior to surgery. Not doing so may mean that your surgery could be cancelled or delayed if the Surgeon or anesthesia staff feels like they are unable to monitor you safely.   Do not shave 48 hours prior to surgery to avoid nicks in your skin which may contribute to postoperative infections.   Contacts, Hearing Aids, dentures or bridgework may not be worn into surgery. DENTURES WILL BE REMOVED PRIOR TO SURGERY PLEASE DO NOT APPLY Poly grip OR ADHESIVES!!!  You may bring a small overnight bag with you on the day of surgery, only pack items that are not valuable. [facility] IS NOT RESPONSIBLE   FOR VALUABLES THAT ARE LOST OR STOLEN.   Do not bring your home medications to the hospital. The Pharmacy will dispense medications listed on your medication list to you during your admission in the Hospital.   Please read over the following fact sheets you were given: IF YOU HAVE QUESTIONS ABOUT YOUR PRE-OP INSTRUCTIONS, PLEASE CALL [phone]   [facility] - Preparing for Surgery            Before surgery, you can play an important role.  Because skin is not sterile, your skin needs to be as free of germs  as possible.  You can reduce the number of germs on your skin by washing with CHG (chlorahexidine gluconate) soap before surgery.  CHG is an antiseptic cleaner which kills germs and bonds with the skin to continue killing germs even after washing. Please DO NOT use if you have an allergy to CHG or antibacterial soaps.  If your skin becomes reddened/irritated stop using the CHG and inform your nurse when you arrive at Short Stay. Do not shave (including legs and underarms) for at least 48 hours prior to the first CHG shower.  You may shave your face/neck.  Please follow these instructions carefully:  1.  Shower with CHG Soap the night before surgery ONLY (DO NOT USE THE CHG SOAP THE MORNING OF SURGERY).  2.  If you choose to wash your hair, wash your hair first as usual with your normal  shampoo.  3.  After you shampoo, rinse your hair and body thoroughly to remove the shampoo.                             4.  Use CHG as you would any other liquid soap.  You can apply chg directly to the skin and wash.  Gently with a scrungie or clean washcloth.  5.  Apply the CHG Soap to your body ONLY FROM THE NECK DOWN.   Do not use on face/ open                           Wound or open sores. Avoid contact with eyes, ears mouth and genitals (private parts).                       Wash face,  Genitals (private parts) with your normal soap.             6.  Wash thoroughly, paying special attention to the area where your  surgery  will be performed.  7.  Thoroughly rinse your body with warm water  from the neck down.  8.  DO NOT shower/wash with your normal soap after using and rinsing off the CHG Soap.                9.  Pat yourself dry with a clean towel.            10.  Wear clean pajamas.            11.  Place clean sheets on your bed the night of your first shower and do not  sleep  with pets.  Day of Surgery : Do not apply any CHG, lotions/deodorants the morning of surgery.  Please wear clean clothes to the hospital/surgery center.   FAILURE TO FOLLOW THESE INSTRUCTIONS MAY RESULT IN THE CANCELLATION OF YOUR SURGERY  PATIENT SIGNATURE_________________________________  NURSE SIGNATURE__________________________________  ________________________________________________________________________      Lauren [name]    An incentive spirometer is a tool that can help keep your lungs clear and active. This tool measures how well you are filling your lungs with each breath. Taking long deep breaths may help reverse or decrease the chance of developing breathing (pulmonary) problems (especially infection) following: A long period of time when you are unable to move or be active. BEFORE THE PROCEDURE  If the spirometer includes an indicator to show your best effort, your nurse or respiratory therapist will set it to a desired goal. If  possible, sit up straight or lean slightly forward. Try not to slouch. Hold the incentive spirometer in an upright position. INSTRUCTIONS FOR USE  Sit on the edge of your bed if possible, or sit up as far as you can in bed or on a chair. Hold the incentive spirometer in an upright position. Breathe out normally. Place the mouthpiece in your mouth and seal your lips tightly around it. Breathe in slowly and as deeply as possible, raising the piston or the ball toward the top of the column. Hold your breath for 3-5 seconds or for as long as possible. Allow the piston or ball to fall to the bottom of the column. Remove the mouthpiece from your mouth and breathe out normally. Rest for a few seconds and repeat Steps 1 through 7 at least 10 times every 1-2 hours when you are awake. Take your time and take a few normal breaths between deep breaths. The spirometer may include an indicator to show your best effort. Use the indicator as a goal  to work toward during each repetition. After each set of 10 deep breaths, practice coughing to be sure your lungs are clear. If you have an incision (the cut made at the time of surgery), support your incision when coughing by placing a pillow or rolled up towels firmly against it. Once you are able to get out of bed, walk around indoors and cough well. You may stop using the incentive spirometer when instructed by your caregiver.  RISKS AND COMPLICATIONS Take your time so you do not get dizzy or light-headed. If you are in pain, you may need to take or ask for pain medication before doing incentive spirometry. It is harder to take a deep breath if you are having pain. AFTER USE Rest and breathe slowly and easily. It can be helpful to keep track of a log of your progress. Your caregiver can provide you with a simple table to help with this. If you are using the spirometer at home, follow these instructions: SEEK MEDICAL CARE IF:  You are having difficultly using the spirometer. You have trouble using the spirometer as often as instructed. Your pain medication is not giving enough relief while using the spirometer. You develop fever of 100.5 F (38.1 C) or higher.                                                                                                    SEEK IMMEDIATE MEDICAL CARE IF:  You cough up bloody sputum that had not been present before. You develop fever of 102 F (38.9 C) or greater. You develop worsening pain at or near the incision site. MAKE SURE YOU:  Understand these instructions. Will watch your condition. Will get help right away if you are not doing well or get worse. Document Released: 09/18/2006 Document Revised: 07/31/2011 Document Reviewed: 11/19/2006 [facility] Patient Information 2014 [place], [place].          WHAT IS A BLOOD TRANSFUSION? Blood Transfusion Information  A transfusion is the replacement of blood or some of its parts. Blood is  made up  of multiple cells which provide different functions. Red blood cells carry oxygen and are used for blood loss replacement. White blood cells fight against infection. Platelets control bleeding. Plasma helps clot blood. Other blood products are available for specialized needs, such as hemophilia or other clotting disorders. BEFORE THE TRANSFUSION  Who gives blood for transfusions?  Healthy volunteers who are fully evaluated to make sure their blood is safe. This is blood bank blood. Transfusion therapy is the safest it has ever been in the practice of medicine. Before blood is taken from a donor, a complete history is taken to make sure that person has no history of diseases nor engages in risky social behavior (examples are intravenous drug use or sexual activity with multiple partners). The donor's travel history is screened to minimize risk of transmitting infections, such as malaria. The donated blood is tested for signs of infectious diseases, such as HIV and hepatitis. The blood is then tested to be sure it is compatible with you in order to minimize the chance of a transfusion reaction. If you or a relative donates blood, this is often done in anticipation of surgery and is not appropriate for emergency situations. It takes many days to process the donated blood. RISKS AND COMPLICATIONS Although transfusion therapy is very safe and saves many lives, the main dangers of transfusion include:  Getting an infectious disease. Developing a transfusion reaction. This is an allergic reaction to something in the blood you were given. Every precaution is taken to prevent this. The decision to have a blood transfusion has been considered carefully by your caregiver before blood is given. Blood is not given unless the benefits outweigh the risks. AFTER THE TRANSFUSION Right after receiving a blood transfusion, you will usually feel much better and more energetic. This is especially true if your red blood  cells have gotten low (anemic). The transfusion raises the level of the red blood cells which carry oxygen, and this usually causes an energy increase. The nurse administering the transfusion will monitor you carefully for complications. HOME CARE INSTRUCTIONS  No special instructions are needed after a transfusion. You may find your energy is better. Speak with your caregiver about any limitations on activity for underlying diseases you may have. SEEK MEDICAL CARE IF:  Your condition is not improving after your transfusion. You develop redness or irritation at the intravenous (IV) site. SEEK IMMEDIATE MEDICAL CARE IF:  Any of the following symptoms occur over the next 12 hours: Shaking chills. You have a temperature by mouth above 102 F (38.9 C), not controlled by medicine. Chest, back, or muscle pain. People around you feel you are not acting correctly or are confused. Shortness of breath or difficulty breathing. Dizziness and fainting. You get a rash or develop hives. You have a decrease in urine output. Your urine turns a dark color or changes to pink, red, or brown. Any of the following symptoms occur over the next 10 days: You have a temperature by mouth above 102 F (38.9 C), not controlled by medicine. Shortness of breath. Weakness after normal activity. The white part of the eye turns yellow (jaundice). You have a decrease in the amount of urine or are urinating less often. Your urine turns a dark color or changes to pink, red, or brown. Document Released: 05/05/2000 Document Revised: 07/31/2011 Document Reviewed: 12/23/2007 [facility] Patient Information 2014 [place], [place].  _______________________________________________________________________

## 2024-06-17 ENCOUNTER — Encounter (HOSPITAL_COMMUNITY): Payer: Self-pay

## 2024-06-17 ENCOUNTER — Encounter (HOSPITAL_COMMUNITY)
Admission: RE | Admit: 2024-06-17 | Discharge: 2024-06-17 | Disposition: A | Discharge status: Home / Self Care | Source: Ambulatory Visit | Attending: General Surgery | Admitting: General Surgery

## 2024-06-17 VITALS — Ht 64.0 in | Wt 134.0 lb

## 2024-06-17 DIAGNOSIS — R7303 Prediabetes: Secondary | ICD-10-CM

## 2024-06-17 DIAGNOSIS — I10 Essential (primary) hypertension: Secondary | ICD-10-CM

## 2024-06-17 DIAGNOSIS — K56699 Other intestinal obstruction unspecified as to partial versus complete obstruction: Secondary | ICD-10-CM

## 2024-06-17 DIAGNOSIS — I2510 Atherosclerotic heart disease of native coronary artery without angina pectoris: Secondary | ICD-10-CM

## 2024-06-17 DIAGNOSIS — Z01818 Encounter for other preprocedural examination: Secondary | ICD-10-CM

## 2024-06-17 HISTORY — DX: Gastro-esophageal reflux disease without esophagitis: K21.9

## 2024-06-17 HISTORY — DX: Prediabetes: R73.03

## 2024-06-18 ENCOUNTER — Encounter (HOSPITAL_COMMUNITY)
Admission: RE | Admit: 2024-06-18 | Discharge: 2024-06-18 | Disposition: A | Discharge status: Home / Self Care | Source: Ambulatory Visit | Attending: General Surgery | Admitting: General Surgery

## 2024-06-18 DIAGNOSIS — I2510 Atherosclerotic heart disease of native coronary artery without angina pectoris: Secondary | ICD-10-CM | POA: Insufficient documentation

## 2024-06-18 DIAGNOSIS — K56699 Other intestinal obstruction unspecified as to partial versus complete obstruction: Secondary | ICD-10-CM | POA: Diagnosis not present

## 2024-06-18 DIAGNOSIS — I10 Essential (primary) hypertension: Secondary | ICD-10-CM | POA: Diagnosis not present

## 2024-06-18 DIAGNOSIS — R7303 Prediabetes: Secondary | ICD-10-CM | POA: Insufficient documentation

## 2024-06-18 DIAGNOSIS — Z01818 Encounter for other preprocedural examination: Secondary | ICD-10-CM | POA: Diagnosis present

## 2024-06-22 NOTE — Progress Notes (Incomplete)
 " Case: [id number] Date/Time: 06/27/24 1038   Procedure: COLECTOMY, PARTIAL, ROBOT-ASSISTED, LAPAROSCOPIC   Anesthesia type: General   Pre-op diagnosis: COLON STRICTURE   Location: [facility] ROOM 02 / [facility]   Surgeons: Debby Hila, MD       DISCUSSION: Lauren [name] is a 79 yo female with PMH of HTN, CAD (by CT), GERD, diverticulitis with colon stricture, hypothyroid, cervical fusion C4-7, anxiety, depression.  Seen by PCP on 06/10/24.   VS: Ht 5' 4 (1.626 m)   Wt 60.8 kg   LMP 05/22/1996   BMI 23.00 kg/m   PROVIDERS: Beam, Lamar POUR, MD   LABS: {CHL AN LABS REVIEWED:112001::Labs reviewed: Acceptable for surgery.} (all labs ordered are listed, but only abnormal results are displayed)  Labs Reviewed - No data to display   IMAGES:   EKG 06/18/24:  NSR Low voltage QRS Nonspecific ST abnormality  CT Calcium score 05/10/2022:  IMPRESSION: Coronary calcium score of 115. This was 68 percentile for age-, race-, and sex-matched controls.  Echo 11/11/2015:  Study Conclusions  - Left ventricle: The cavity size was normal. Wall thickness was   increased in a pattern of mild LVH. Systolic function was normal.   The estimated ejection fraction was in the range of 55% to 60%.   Wall motion was normal; there were no regional wall motion   abnormalities. Doppler parameters are consistent with abnormal   left ventricular relaxation (grade 1 diastolic dysfunction). - Aortic valve: There was no stenosis. - Mitral valve: There was trivial regurgitation. - Right ventricle: The cavity size was normal. Systolic function   was normal. - Tricuspid valve: Peak RV-RA gradient (S): 23 mm Hg. - Pulmonary arteries: PA peak pressure: 26 mm Hg (S). - Inferior vena cava: The vessel was normal in size. The   respirophasic diameter changes were in the normal range (>= 50%),   consistent with normal central venous pressure.  Past Medical History:  Diagnosis Date   Anxiety    Arthritis     Carotid stenosis 10/08/2013   Cervical disc disease    Depression    Diverticulitis    Diverticulosis    Frequent headaches    GERD (gastroesophageal reflux disease)    Hypertension    does not now per pt.   Hypothyroidism 10/26/2015   Impaired glucose tolerance 10/08/2013   normal HgA1C   Neck pain    Pre-diabetes    Psoriasis     Past Surgical History:  Procedure Laterality Date   BREAST BIOPSY Left 2011   benign    CERVICAL FUSION  1980   ACDF C3-4   CHOLECYSTECTOMY     laparoscopic   COLONOSCOPY     EYE SURGERY Bilateral    Cataract extraction   I & D EXTREMITY Right 03/07/2022   Procedure: IRRIGATION AND DEBRIDEMENT OF FOOT WITH BONE BIOPSY;  Surgeon: Gershon Donnice SAUNDERS, DPM;  Location: [facility] OR;  Service: Podiatry;  Laterality: Right;   POSTERIOR CERVICAL FUSION/FORAMINOTOMY N/A 11/05/2014   Procedure: Cervical four to Cervical seven  Posterior Cervical Fusion with lateral mass fixation;  Surgeon: Alm GORMAN Molt, MD;  Location: [facility] NEURO ORS;  Service: Neurosurgery;  Laterality: N/A;   C4 - C7 Posterior Cervical Fusion with lateral mass fixation   TOTAL ABDOMINAL HYSTERECTOMY  1998   Fibroid   UPPER GASTROINTESTINAL ENDOSCOPY      MEDICATIONS:  aspirin EC 81 MG tablet   Cholecalciferol (VITAMIN D3) 1000 units CAPS   ezetimibe  (ZETIA ) 10 MG tablet  famotidine  (PEPCID ) 40 MG tablet   hydrochlorothiazide  (MICROZIDE ) 12.5 MG capsule   levothyroxine  (SYNTHROID ) 50 MCG tablet   lubiprostone  (AMITIZA ) 24 MCG capsule   RESTASIS  0.05 % ophthalmic emulsion   zolpidem  (AMBIEN ) 5 MG tablet   No current facility-administered medications for this encounter.          "

## 2024-06-26 NOTE — Anesthesia Preprocedure Evaluation (Addendum)
 "                                  Anesthesia Evaluation  Patient identified by MRN, date of birth, ID band Patient awake    Reviewed: Allergy & Precautions, NPO status , Patient's Chart, lab work & pertinent test results  History of Anesthesia Complications Negative for: history of anesthetic complications  Airway Mallampati: II  TM Distance: >3 FB Neck ROM: Full    Dental no notable dental hx. (+) Implants, Dental Advisory Given, Missing, Teeth Intact   Pulmonary    Pulmonary exam normal breath sounds clear to auscultation       Cardiovascular Exercise Tolerance: Good hypertension, (-) angina (-) Past MI Normal cardiovascular exam Rhythm:Regular Rate:Normal     Neuro/Psych  Headaches PSYCHIATRIC DISORDERS Anxiety Depression       GI/Hepatic ,GERD  Medicated and Controlled,,  Endo/Other  Hypothyroidism    Renal/GU Lab Results      Component                Value               Date                          K                        4.3                 05/18/2024                     CREATININE               0.73                05/18/2024                     Musculoskeletal  (+) Arthritis ,  cervical fusion C4-7   Abdominal   Peds  Hematology Lab Results      Component                Value               Date                      WBC                      5.2                 05/18/2024                HGB                      12.7                05/18/2024                HCT                      38.7                05/18/2024                MCV  97.2                05/18/2024                PLT                      243                 05/18/2024              Anesthesia Other Findings All: celebrex  Reproductive/Obstetrics                              Anesthesia Physical Anesthesia Plan  ASA: 3  Anesthesia Plan: General   Post-op Pain Management: Precedex , Ofirmev  IV (intra-op)*, Lidocaine  infusion*  and Ketamine  IV*   Induction: Intravenous  PONV Risk Score and Plan: 4 or greater and Treatment may vary due to age or medical condition, Dexamethasone  and Ondansetron   Airway Management Planned: Oral ETT  Additional Equipment: None  Intra-op Plan:   Post-operative Plan: Extubation in OR  Informed Consent: I have reviewed the patients History and Physical, chart, labs and discussed the procedure including the risks, benefits and alternatives for the proposed anesthesia with the patient or authorized representative who has indicated his/her understanding and acceptance.     Dental advisory given  Plan Discussed with: CRNA and Surgeon  Anesthesia Plan Comments: (PMH of HTN, CAD (by CT), GERD, diverticulitis with colon stricture, hypothyroid, cervical fusion C4-7, anxiety, depression. Labs for DOS. EKG WNL)         Anesthesia Quick Evaluation  "

## 2024-06-27 ENCOUNTER — Inpatient Hospital Stay (HOSPITAL_COMMUNITY): Payer: Self-pay | Admitting: Medical

## 2024-06-27 ENCOUNTER — Inpatient Hospital Stay (HOSPITAL_COMMUNITY): Admission: RE | Admit: 2024-06-27 | Source: Home / Self Care | Admitting: General Surgery

## 2024-06-27 ENCOUNTER — Encounter (HOSPITAL_COMMUNITY): Payer: Self-pay | Admitting: General Surgery

## 2024-06-27 ENCOUNTER — Encounter (HOSPITAL_COMMUNITY): Admission: RE | Payer: Self-pay | Source: Home / Self Care

## 2024-06-27 ENCOUNTER — Other Ambulatory Visit: Payer: Self-pay

## 2024-06-27 ENCOUNTER — Encounter (HOSPITAL_COMMUNITY)

## 2024-06-27 DIAGNOSIS — I2510 Atherosclerotic heart disease of native coronary artery without angina pectoris: Secondary | ICD-10-CM

## 2024-06-27 DIAGNOSIS — I10 Essential (primary) hypertension: Secondary | ICD-10-CM

## 2024-06-27 DIAGNOSIS — K56699 Other intestinal obstruction unspecified as to partial versus complete obstruction: Principal | ICD-10-CM | POA: Diagnosis present

## 2024-06-27 DIAGNOSIS — R7303 Prediabetes: Secondary | ICD-10-CM

## 2024-06-27 DIAGNOSIS — Z01818 Encounter for other preprocedural examination: Secondary | ICD-10-CM

## 2024-06-27 LAB — TYPE AND SCREEN
ABO/RH(D): O POS
Antibody Screen: NEGATIVE

## 2024-06-27 LAB — CBC
HCT: 38.3 % (ref 36.0–46.0)
Hemoglobin: 12.1 g/dL (ref 12.0–15.0)
MCH: 31.5 pg (ref 26.0–34.0)
MCHC: 31.6 g/dL (ref 30.0–36.0)
MCV: 99.7 fL (ref 80.0–100.0)
Platelets: 171 10*3/uL (ref 150–400)
RBC: 3.84 MIL/uL — ABNORMAL LOW (ref 3.87–5.11)
RDW: 12.8 % (ref 11.5–15.5)
WBC: 13.2 10*3/uL — ABNORMAL HIGH (ref 4.0–10.5)
nRBC: 0 % (ref 0.0–0.2)

## 2024-06-27 LAB — BASIC METABOLIC PANEL WITH GFR
Anion gap: 14 (ref 5–15)
BUN: 9 mg/dL (ref 8–23)
CO2: 24 mmol/L (ref 22–32)
Calcium: 9 mg/dL (ref 8.9–10.3)
Chloride: 104 mmol/L (ref 98–111)
Creatinine, Ser: 0.86 mg/dL (ref 0.44–1.00)
GFR, Estimated: >60 mL/min
Glucose, Bld: 146 mg/dL — ABNORMAL HIGH (ref 70–99)
Potassium: 2.9 mmol/L — ABNORMAL LOW (ref 3.5–5.1)
Sodium: 142 mmol/L (ref 135–145)

## 2024-06-27 MED ORDER — BUPIVACAINE-EPINEPHRINE 0.25% -1:200000 IJ SOLN
INTRAMUSCULAR | Status: DC | PRN
  Administered 2024-06-27: 40 mL

## 2024-06-27 MED ORDER — SODIUM CHLORIDE 0.9 % IV SOLN
2.0000 g | Freq: Two times a day (BID) | INTRAVENOUS | Status: AC
  Filled 2024-06-27: qty 2

## 2024-06-27 MED ORDER — ORAL CARE MOUTH RINSE: 15.0000 mL | Freq: Once | OROMUCOSAL | Status: AC

## 2024-06-27 MED ORDER — SIMETHICONE 80 MG PO CHEW: 40.0000 mg | CHEWABLE_TABLET | Freq: Four times a day (QID) | ORAL | Status: AC | PRN

## 2024-06-27 MED ORDER — LIDOCAINE HCL 2 % IJ SOLN
INTRAMUSCULAR | Status: AC
  Filled 2024-06-27: qty 20

## 2024-06-27 MED ORDER — ENSURE PRE-SURGERY PO LIQD: 296.0000 mL | Freq: Once | ORAL | Status: DC

## 2024-06-27 MED ORDER — DIPHENHYDRAMINE HCL 50 MG/ML IJ SOLN: 12.5000 mg | Freq: Four times a day (QID) | INTRAMUSCULAR | Status: AC | PRN

## 2024-06-27 MED ORDER — DEXAMETHASONE SOD PHOSPHATE PF 10 MG/ML IJ SOLN
INTRAMUSCULAR | Status: DC | PRN
  Administered 2024-06-27: 5 mg via INTRAVENOUS

## 2024-06-27 MED ORDER — ENOXAPARIN SODIUM 40 MG/0.4ML IJ SOSY: 40.0000 mg | PREFILLED_SYRINGE | INTRAMUSCULAR | Status: AC

## 2024-06-27 MED ORDER — LIDOCAINE HCL (PF) 2 % IJ SOLN
INTRAMUSCULAR | Status: AC
  Filled 2024-06-27: qty 5

## 2024-06-27 MED ORDER — SACCHAROMYCES BOULARDII 250 MG PO CAPS
250.0000 mg | ORAL_CAPSULE | Freq: Two times a day (BID) | ORAL | Status: AC
  Administered 2024-06-27: 250 mg via ORAL
  Filled 2024-06-27: qty 1

## 2024-06-27 MED ORDER — PHENYLEPHRINE HCL (PRESSORS) 10 MG/ML IV SOLN
INTRAVENOUS | Status: AC
  Filled 2024-06-27: qty 1

## 2024-06-27 MED ORDER — SUGAMMADEX SODIUM 200 MG/2ML IV SOLN
INTRAVENOUS | Status: DC | PRN
  Administered 2024-06-27: 200 mg via INTRAVENOUS

## 2024-06-27 MED ORDER — LIDOCAINE HCL (PF) 2 % IJ SOLN
INTRAMUSCULAR | Status: DC | PRN
  Administered 2024-06-27: 1.5 mg/kg/h via INTRADERMAL
  Administered 2024-06-27: 100 mg via INTRADERMAL

## 2024-06-27 MED ORDER — DEXMEDETOMIDINE HCL IN NACL 80 MCG/20ML IV SOLN
INTRAVENOUS | Status: AC
  Filled 2024-06-27: qty 20

## 2024-06-27 MED ORDER — ALVIMOPAN 12 MG PO CAPS
12.0000 mg | ORAL_CAPSULE | ORAL | Status: AC
  Administered 2024-06-27: 12 mg via ORAL
  Filled 2024-06-27: qty 1

## 2024-06-27 MED ORDER — SODIUM CHLORIDE (PF) 0.9 % IJ SOLN
INTRAMUSCULAR | Status: AC
  Filled 2024-06-27: qty 20

## 2024-06-27 MED ORDER — ACETAMINOPHEN 500 MG PO TABS
1000.0000 mg | ORAL_TABLET | Freq: Four times a day (QID) | ORAL | Status: AC
  Administered 2024-06-27: 1000 mg via ORAL
  Filled 2024-06-27 (×3): qty 2

## 2024-06-27 MED ORDER — LEVOTHYROXINE SODIUM 50 MCG PO TABS: 50.0000 ug | ORAL_TABLET | Freq: Every day | ORAL | Status: AC

## 2024-06-27 MED ORDER — ENSURE SURGERY PO LIQD: 237.0000 mL | Freq: Two times a day (BID) | ORAL | Status: AC

## 2024-06-27 MED ORDER — FENTANYL CITRATE (PF) 100 MCG/2ML IJ SOLN
INTRAMUSCULAR | Status: AC
  Filled 2024-06-27: qty 2

## 2024-06-27 MED ORDER — FAMOTIDINE 20 MG PO TABS: 40.0000 mg | ORAL_TABLET | Freq: Every day | ORAL | Status: AC

## 2024-06-27 MED ORDER — 0.9 % SODIUM CHLORIDE (POUR BTL) OPTIME
TOPICAL | Status: DC | PRN
  Administered 2024-06-27 (×2): 1000 mL

## 2024-06-27 MED ORDER — CYCLOSPORINE 0.05 % OP EMUL
1.0000 [drp] | Freq: Two times a day (BID) | OPHTHALMIC | Status: AC
  Administered 2024-06-27: 1 [drp] via OPHTHALMIC
  Filled 2024-06-27: qty 30

## 2024-06-27 MED ORDER — PROPOFOL 10 MG/ML IV BOLUS
INTRAVENOUS | Status: AC
  Filled 2024-06-27: qty 20

## 2024-06-27 MED ORDER — EPHEDRINE 5 MG/ML INJ
INTRAVENOUS | Status: AC
  Filled 2024-06-27: qty 10

## 2024-06-27 MED ORDER — EPHEDRINE SULFATE (PRESSORS) 25 MG/5ML IV SOSY
PREFILLED_SYRINGE | INTRAVENOUS | Status: DC | PRN
  Administered 2024-06-27: 5 mg via INTRAVENOUS
  Administered 2024-06-27: 2.5 mg via INTRAVENOUS
  Administered 2024-06-27: 10 mg via INTRAVENOUS
  Administered 2024-06-27: 5 mg via INTRAVENOUS
  Administered 2024-06-27: 10 mg via INTRAVENOUS
  Administered 2024-06-27: 2.5 mg via INTRAVENOUS

## 2024-06-27 MED ORDER — ALUM & MAG HYDROXIDE-SIMETH 200-200-20 MG/5ML PO SUSP: 30.0000 mL | Freq: Four times a day (QID) | ORAL | Status: AC | PRN

## 2024-06-27 MED ORDER — KETAMINE HCL 50 MG/5ML IJ SOSY
PREFILLED_SYRINGE | INTRAMUSCULAR | Status: DC | PRN
  Administered 2024-06-27: 10 mg via INTRAVENOUS
  Administered 2024-06-27: 20 mg via INTRAVENOUS

## 2024-06-27 MED ORDER — ENSURE PRE-SURGERY PO LIQD: 592.0000 mL | Freq: Once | ORAL | Status: DC

## 2024-06-27 MED ORDER — FENTANYL CITRATE (PF) 50 MCG/ML IJ SOSY
25.0000 ug | PREFILLED_SYRINGE | INTRAMUSCULAR | Status: DC | PRN
  Administered 2024-06-27: 50 ug via INTRAVENOUS

## 2024-06-27 MED ORDER — DIPHENHYDRAMINE HCL 12.5 MG/5ML PO ELIX: 12.5000 mg | ORAL_SOLUTION | Freq: Four times a day (QID) | ORAL | Status: AC | PRN

## 2024-06-27 MED ORDER — ONDANSETRON HCL 4 MG PO TABS: 4.0000 mg | ORAL_TABLET | Freq: Four times a day (QID) | ORAL | Status: AC | PRN

## 2024-06-27 MED ORDER — HEPARIN SODIUM (PORCINE) 5000 UNIT/ML IJ SOLN
5000.0000 [IU] | Freq: Once | INTRAMUSCULAR | Status: AC
  Administered 2024-06-27: 5000 [IU] via SUBCUTANEOUS
  Filled 2024-06-27: qty 1

## 2024-06-27 MED ORDER — TRAMADOL HCL 50 MG PO TABS
50.0000 mg | ORAL_TABLET | Freq: Four times a day (QID) | ORAL | Status: AC | PRN
  Administered 2024-06-27: 100 mg via ORAL
  Filled 2024-06-27: qty 2

## 2024-06-27 MED ORDER — SPY AGENT GREEN - (INDOCYANINE FOR INJECTION)
INTRAMUSCULAR | Status: DC | PRN
  Administered 2024-06-27: 1 mL via INTRAVENOUS

## 2024-06-27 MED ORDER — BISACODYL 5 MG PO TBEC: 20.0000 mg | DELAYED_RELEASE_TABLET | Freq: Once | ORAL | Status: DC

## 2024-06-27 MED ORDER — GABAPENTIN 100 MG PO CAPS
300.0000 mg | ORAL_CAPSULE | Freq: Two times a day (BID) | ORAL | Status: AC
  Administered 2024-06-27: 300 mg via ORAL
  Filled 2024-06-27: qty 3

## 2024-06-27 MED ORDER — PHENYLEPHRINE HCL-NACL 20-0.9 MG/250ML-% IV SOLN
INTRAVENOUS | Status: DC | PRN
  Administered 2024-06-27: 20 ug/min via INTRAVENOUS

## 2024-06-27 MED ORDER — POLYETHYLENE GLYCOL 3350 17 GM/SCOOP PO POWD: 238.0000 g | Freq: Once | ORAL | Status: DC

## 2024-06-27 MED ORDER — SODIUM CHLORIDE 0.9 % IV SOLN
2.0000 g | INTRAVENOUS | Status: AC
  Administered 2024-06-27: 2 g via INTRAVENOUS
  Filled 2024-06-27: qty 2

## 2024-06-27 MED ORDER — ONDANSETRON HCL 4 MG/2ML IJ SOLN
INTRAMUSCULAR | Status: DC | PRN
  Administered 2024-06-27: 4 mg via INTRAVENOUS

## 2024-06-27 MED ORDER — ACETAMINOPHEN 500 MG PO TABS
1000.0000 mg | ORAL_TABLET | ORAL | Status: AC
  Administered 2024-06-27: 1000 mg via ORAL
  Filled 2024-06-27: qty 2

## 2024-06-27 MED ORDER — LACTATED RINGERS IV SOLN: INTRAVENOUS | Status: DC

## 2024-06-27 MED ORDER — CHLORHEXIDINE GLUCONATE 0.12 % MT SOLN
15.0000 mL | Freq: Once | OROMUCOSAL | Status: AC
  Administered 2024-06-27: 15 mL via OROMUCOSAL

## 2024-06-27 MED ORDER — KETAMINE HCL 50 MG/5ML IJ SOSY
PREFILLED_SYRINGE | INTRAMUSCULAR | Status: AC
  Filled 2024-06-27: qty 5

## 2024-06-27 MED ORDER — EZETIMIBE 10 MG PO TABS: 10.0000 mg | ORAL_TABLET | Freq: Every day | ORAL | Status: AC

## 2024-06-27 MED ORDER — ZOLPIDEM TARTRATE 5 MG PO TABS: 5.0000 mg | ORAL_TABLET | Freq: Every evening | ORAL | Status: AC | PRN

## 2024-06-27 MED ORDER — GABAPENTIN 300 MG PO CAPS
300.0000 mg | ORAL_CAPSULE | ORAL | Status: AC
  Administered 2024-06-27: 300 mg via ORAL
  Filled 2024-06-27: qty 1

## 2024-06-27 MED ORDER — SODIUM CHLORIDE (PF) 0.9 % IJ SOLN
INTRAMUSCULAR | Status: AC
  Filled 2024-06-27: qty 10

## 2024-06-27 MED ORDER — ONDANSETRON HCL 4 MG/2ML IJ SOLN: 4.0000 mg | Freq: Once | INTRAMUSCULAR | Status: DC | PRN

## 2024-06-27 MED ORDER — RINGERS IRRIGATION IR SOLN
Status: DC | PRN
  Administered 2024-06-27: 1

## 2024-06-27 MED ORDER — HYDROCHLOROTHIAZIDE 25 MG PO TABS: 12.5000 mg | ORAL_TABLET | Freq: Every day | ORAL | Status: AC

## 2024-06-27 MED ORDER — PROPOFOL 10 MG/ML IV BOLUS
INTRAVENOUS | Status: DC | PRN
  Administered 2024-06-27: 90 mg via INTRAVENOUS

## 2024-06-27 MED ORDER — FENTANYL CITRATE (PF) 50 MCG/ML IJ SOSY
PREFILLED_SYRINGE | INTRAMUSCULAR | Status: AC
  Filled 2024-06-27: qty 2

## 2024-06-27 MED ORDER — ALVIMOPAN 12 MG PO CAPS: 12.0000 mg | ORAL_CAPSULE | Freq: Two times a day (BID) | ORAL | Status: AC

## 2024-06-27 MED ORDER — ROCURONIUM BROMIDE 10 MG/ML (PF) SYRINGE
PREFILLED_SYRINGE | INTRAVENOUS | Status: DC | PRN
  Administered 2024-06-27: 30 mg via INTRAVENOUS
  Administered 2024-06-27: 40 mg via INTRAVENOUS

## 2024-06-27 MED ORDER — KCL IN DEXTROSE-NACL 20-5-0.45 MEQ/L-%-% IV SOLN
INTRAVENOUS | Status: AC
  Filled 2024-06-27: qty 1000

## 2024-06-27 MED ORDER — BUPIVACAINE-EPINEPHRINE (PF) 0.25% -1:200000 IJ SOLN
INTRAMUSCULAR | Status: AC
  Filled 2024-06-27: qty 60

## 2024-06-27 MED ORDER — FENTANYL CITRATE (PF) 100 MCG/2ML IJ SOLN
INTRAMUSCULAR | Status: DC | PRN
  Administered 2024-06-27: 50 ug via INTRAVENOUS
  Administered 2024-06-27 (×2): 25 ug via INTRAVENOUS

## 2024-06-27 MED ORDER — HYDROMORPHONE HCL 1 MG/ML IJ SOLN: 0.5000 mg | INTRAMUSCULAR | Status: AC | PRN

## 2024-06-27 MED ORDER — ONDANSETRON HCL 4 MG/2ML IJ SOLN: 4.0000 mg | Freq: Four times a day (QID) | INTRAMUSCULAR | Status: AC | PRN

## 2024-06-27 MED ORDER — ACETAMINOPHEN 10 MG/ML IV SOLN: 1000.0000 mg | Freq: Once | INTRAVENOUS | Status: DC | PRN

## 2024-06-27 NOTE — Anesthesia Procedure Notes (Signed)
 Procedure Name: Intubation Date/Time: 06/27/2024 10:52 AM  Performed by: Franchot Delon RAMAN, CRNAPre-anesthesia Checklist: Patient identified, Emergency Drugs available, Suction available and Patient being monitored Patient Re-evaluated:Patient Re-evaluated prior to induction Oxygen Delivery Method: Circle System Utilized Preoxygenation: Pre-oxygenation with 100% oxygen Induction Type: IV induction Ventilation: Mask ventilation without difficulty Laryngoscope Size: Glidescope and 3 Grade View: Grade I Tube type: Oral Tube size: 7.0 mm Number of attempts: 1 Airway Equipment and Method: Stylet and Oral airway Placement Confirmation: ETT inserted through vocal cords under direct vision, positive ETCO2 and breath sounds checked- equal and bilateral Secured at: 19 cm Tube secured with: Tape Dental Injury: Teeth and Oropharynx as per pre-operative assessment

## 2024-06-27 NOTE — H&P (Signed)
 " REFERRING PHYSICIAN:  Avram Lupita BRAVO, MD   PROVIDER:  BERNARDA WANDA NED, MD   MRN: [id number] DOB: Dec 20, 1945    Subjective    Chief Complaint: New Consultation ( NEW PATIENT - sigmoid colon stricture)       History of Present Illness: Lauren [name] is a 79 y.o. female who is seen today as an office consultation at the request of Dr. Avram for evaluation of New Consultation ( NEW PATIENT - sigmoid colon stricture) .  Patient with a history of recurrent diverticulitis.  Her most recent episode was in 2021.  Over the past few months she has developed a change in bowel habits with bloating.  She has a longstanding history of constipation at baseline but her current symptoms are more severe.  She is using a MiraLAX  laxative twice a day.  Colonoscopy was completed on 04/23/2024 and this did show a sigmoid stricture.  It was traversed.  No other findings were noted.  CT scan shows some colonic wall thickening in the sigmoid colon versus nondistention but otherwise normal evaluation.     Review of Systems: A complete review of systems was obtained from the patient.  I have reviewed this information and discussed as appropriate with the patient.  See HPI as well for other ROS.      Medical History:     Past Medical History:  Diagnosis Date   Anxiety     Arthritis     Carotid stenosis 10/08/2013   Cervical disc disease     Depression     Diverticulitis     Diverticulosis     Frequent headaches     Hypertension      does not now per pt.   Hypothyroidism 10/26/2015   Impaired glucose tolerance 10/08/2013    normal HgA1C   Neck pain     Psoriasis                   Past Surgical History:  Procedure Laterality Date   BREAST BIOPSY Left 2011    benign    CERVICAL FUSION        approx 1980   CHOLECYSTECTOMY       COLONOSCOPY       I & D EXTREMITY Right 03/07/2022    Procedure: IRRIGATION AND DEBRIDEMENT OF FOOT WITH BONE BIOPSY;  Surgeon: Gershon Donnice SAUNDERS, DPM;   Location: [facility] OR;  Service: Podiatry;  Laterality: Right;   POSTERIOR CERVICAL FUSION/FORAMINOTOMY N/A 11/05/2014    Procedure: Cervical four to Cervical seven  Posterior Cervical Fusion with lateral mass fixation;  Surgeon: Alm GORMAN Molt, MD;  Location: [facility];  Service: Neurosurgery;  Laterality: N/A;   C4 - C7 Posterior Cervical Fusion with lateral mass fixation   TOTAL ABDOMINAL HYSTERECTOMY   1998    Fibroid   UPPER GASTROINTESTINAL ENDOSCOPY                    Allergies  Allergen Reactions   Celecoxib Itching and Rash            Current Outpatient Medications on File Prior to Visit  Medication Sig Dispense Refill   aspirin 81 MG EC tablet Take 81 mg by mouth once daily       cholecalciferol (VITAMIN D3) 1000 unit tablet Take 1,000 Units by mouth once daily       citalopram  (CELEXA ) 10 MG tablet Take 10 mg by mouth once daily  ezetimibe  (ZETIA ) 10 mg tablet Take 10 mg by mouth once daily       famotidine  (PEPCID ) 40 MG tablet Take 40 mg by mouth once daily       hydroCHLOROthiazide  (MICROZIDE ) 12.5 mg capsule Take 12.5 mg by mouth once daily       levothyroxine  (SYNTHROID ) 50 MCG tablet Take 50 mcg by mouth every morning before breakfast (0630)       lubiprostone  (AMITIZA ) 24 MCG capsule Take 24 mcg by mouth 2 (two) times daily with meals       potassium chloride  (KLOR-CON ) 10 MEQ ER tablet Take 10 mEq by mouth once daily       zolpidem  (AMBIEN ) 5 MG tablet Take 5 mg by mouth at bedtime as needed for Sleep        No current facility-administered medications on file prior to visit.      History reviewed. No pertinent family history.    Social History       Tobacco Use  Smoking Status Never  Smokeless Tobacco Never      Social History        Socioeconomic History   Marital status: Married  Tobacco Use   Smoking status: Never   Smokeless tobacco: Never  Vaping Use   Vaping status: Never Used  Substance and Sexual Activity   Alcohol use: Never   Drug  use: Never    Social Drivers of Acupuncturist Strain: Low Risk (01/07/2024)    Received from [facility]    Overall Financial Resource Strain (CARDIA)     How hard is it for you to pay for the very basics like food, housing, medical care, and heating?: Not very hard  Food Insecurity: No Food Insecurity (01/07/2024)    Received from [facility]    Hunger Vital Sign     Within the past 12 months, you worried that your food would run out before you got the money to buy more.: Never true     Within the past 12 months, the food you bought just didn't last and you didn't have money to get more.: Never true  Transportation Needs: No Transportation Needs (01/07/2024)    Received from [facility] - Transportation     In the past 12 months, has lack of transportation kept you from medical appointments or from getting medications?: No     In the past 12 months, has lack of transportation kept you from meetings, work, or from getting things needed for daily living?: No  Physical Activity: Insufficiently Active (01/07/2024)    Received from [facility]    Exercise Vital Sign     On average, how many days per week do you engage in moderate to strenuous exercise (like a brisk walk)?: 2 days     On average, how many minutes do you engage in exercise at this level?: 40 min  Stress: No Stress Concern Present (01/07/2024)    Received from [facility] - Occupational Stress Questionnaire     Do you feel stress - tense, restless, nervous, or anxious, or unable to sleep at night because your mind is troubled all the time - these days?: Not at all  Social Connections: Moderately Isolated (01/07/2024)    [facility]    Social Connection and Isolation Panel     In a typical week, how many times do  you talk on the phone with family, friends, or neighbors?: More than three times a week     How often do you attend church or  religious services?: Never     Do you belong to any clubs or organizations such as church groups, unions, fraternal or athletic groups, or school groups?: No     How often do you attend meetings of the clubs or organizations you belong to?: Never     Are you married, widowed, divorced, separated, never married, or living with a partner?: Married  Housing Stability: Unknown (05/26/2024)    Housing Stability Vital Sign     Homeless in the Last Year: No      Objective:      Vitals:   06/27/24 0822  BP: (!) 157/63  Pulse: 94  Resp: 16  Temp: 98.3 F (36.8 C)  SpO2: 98%    Today's Vitals   06/27/24 0822 06/27/24 0839  BP: (!) 157/63   Pulse: 94   Resp: 16   Temp: 98.3 F (36.8 C)   TempSrc: Oral   SpO2: 98%   Weight:  60.8 kg  Height:  5' 5.5 (1.664 m)  PainSc:  0-No pain   Body mass index is 21.96 kg/m.    Exam Gen: NAD CV: RRR Pulm: CTA Abd: soft       Labs, Imaging and Diagnostic Testing: Colonoscopy report and images reviewed.  CT report and images reviewed.   Assessment and Plan:  Colon stricture (CMS/HHS-HCC)   79 year old female with change in bowel habits and CT and colonoscopy showing a benign appearing internal sigmoid stricture.  I have recommended partial colectomy.  We discussed this in detail including risk and benefits of surgery.  We discussed that she will most likely return to her baseline bowel habits after she heals from surgery.  All questions were answered.  Patient wishes to proceed.   The surgery and anatomy were described to the patient as well as the risks of surgery and the possible complications.  These include: Bleeding, deep abdominal infections and possible wound complications such as hernia and infection, damage to adjacent structures, leak of surgical connections, which can lead to other surgeries and possibly an ostomy, possible need for other procedures, such as abscess drains in radiology, possible prolonged hospital stay, possible  diarrhea from removal of part of the colon, possible constipation from narcotics, possible bowel, bladder or sexual dysfunction if having rectal surgery, prolonged fatigue/weakness or appetite loss, possible early recurrence of of disease, possible complications of their medical problems such as heart disease or arrhythmias or lung problems, death (less than 1%). I believe the patient understands and wishes to proceed with the surgery.    Bernarda JAYSON Ned, MD Colon and Rectal Surgery [facility]  "

## 2024-06-27 NOTE — Discharge Instructions (Signed)

## 2024-06-27 NOTE — Op Note (Signed)
 06/27/2024  1:09 PM  PATIENT:  Lauren [name]  79 y.o. female  Patient Care Team: Beam, Lamar POUR, MD as PCP - General (Family Medicine) Mindi Mt, MD (Inactive) as Consulting Physician (Anesthesiology) [facility] [place] as Consulting Physician (Optometry)  PRE-OPERATIVE DIAGNOSIS:  COLON STRICTURE  POST-OPERATIVE DIAGNOSIS:  COLON STRICTURE  PROCEDURE:   LOW ANTERIOR RESECTION, ROBOT-ASSISTED, LYSIS OF ADHESIONS    Surgeon(s): Teresa Lonni HERO, MD Debby Hila, MD   ASSIST: Dr Teresa   ANESTHESIA:   local and general  EBL: 30ml  Total I/O In: 1100 [I.V.:1000; IV Piggyback:100] Out: 35 [Urine:35]  Delay start of Pharmacological VTE agent (>24hrs) due to surgical blood loss or risk of bleeding:  no  DRAINS: none   SPECIMEN:  Source of Specimen:  Rectosigmoid colon  DISPOSITION OF SPECIMEN:  PATHOLOGY  COUNTS:  YES  PLAN OF CARE: Admit to inpatient   PATIENT DISPOSITION:  PACU - hemodynamically stable.  INDICATION:    79 y.o. F with a recent change in bowel habits and a sigmoid stricture seen on colonoscopy.  I recommended resection:  The anatomy & physiology of the digestive tract was discussed.  The pathophysiology was discussed.  Natural history risks without surgery was discussed.   I worked to give an overview of the disease and the frequent need to have multispecialty involvement.  I feel the risks of no intervention will lead to serious problems that outweigh the operative risks; therefore, I recommended a partial colectomy to remove the pathology.  Laparoscopic & open techniques were discussed.   Risks such as bleeding, infection, abscess, leak, reoperation, possible ostomy, hernia, heart attack, death, and other risks were discussed.  I noted a good likelihood this will help address the problem.   Goals of post-operative recovery were discussed as well.    The patient expressed understanding & wished to proceed with surgery.  OR FINDINGS:    Patient had significant pelvic adhesions  The anastomosis rests ~10 cm from the anal verge by rigid proctoscopy.  DESCRIPTION:   Informed consent was confirmed.  The patient underwent general anaesthesia without difficulty.  The patient was positioned appropriately.  VTE prevention in place.  The patient's abdomen was clipped, prepped, & draped in a sterile fashion.  Surgical timeout confirmed our plan.  The patient was positioned in reverse Trendelenburg.  Abdominal entry was gained using a Varies needle in the LUQ.  Entry was clean.  I induced carbon dioxide insufflation.  An 8mm robotic port was placed in the RUQ.  Camera inspection revealed no injury.  Extra ports were carefully placed under direct laparoscopic visualization.  I laparoscopically reflected the greater omentum and the upper abdomen the small bowel in the upper abdomen. The patient was appropriately positioned and the robot was docked to the patient's left side.  Instruments were placed under direct visualization.    I mobilized the small bowel out of the pelvis.  This required ~30 minutes and blunt and sharp dissection to clear the loops and mobilize them to the upper abdomen.  The sigmoid colon was then mobilized off of the pelvic sidewall. There were dense surgical adhesions that were taken down. I identified the left ureter and confirmed it was not involved in the inflammation.  I scored the base of peritoneum of the right side of the mesentery of the left colon from the ligament of Treitz to the peritoneal reflection of the mid rectum.  The patient had a sigmoid colon that was adherent to  her vaginal cuff from her previous surgery.  This was cleared using sharp dissection.  I elevated the sigmoid mesentery and enetered into the retro-mesenteric plane. We were able to identify the left ureter and gonadal vessels from that side as well. We kept those posterior within the retroperitoneum and elevated the left colon mesentery off  that. I did isolated IMA pedicle but did not ligate it yet.  I continued distally and got into the avascular plane posterior to the mesorectum. This allowed me to help mobilize the rectum as well by freeing the mesorectum off the sacrum.  I mobilized the peritoneal coverings towards the peritoneal reflection on both the right and left sides of the rectum.  I could see the right and left ureters and stayed away from them.    I skeletonized the inferior mesenteric artery pedicle.  After confirming the left ureter was out of the way, I went ahead and ligated the inferior mesenteric artery pedicle with bipolar robotic vessel sealer well above its takeoff from the aorta.  We ensured hemostasis. I skeletonized the mesorectum at the junction at the proximal rectum using blunt dissection & bipolar robotic vessel sealer.  I mobilized the left colon in a lateral to medial fashion off the line of Toldt up towards the splenic flexure to ensure good mobilization of the left colon to reach into the pelvis.  I divided the remaining mesentery up to the level of the descending and sigmoid colon junction.  2.5 mg Firefly was injected via her IV.  Perfusion of the remaining colon and rectum was good.  The rectosigmoid was transected using 1 load of the 60mm green load stapler.  The robot was undocked and the suprapubic 12mm port was enlarged into a Pfannenstiel incision and the Alexis wound protector was placed.  The colon was removed and transected proximally over a purse-stringer device.  A 2-0 prolene was placed.  This was secured with 3-0 silk sutures.  A 29mm anvil was placed and the purse string was tied tightly around this.  This was placed into the abdomen and the abdomen was insufflated.  The rectum was gently dilated using rectal dilators. An end to end colorectal anastomosis was completed under direct laparoscopic visualization.  This was tested under irrigation with insufflation with a flexible sigmoidoscope.  There was  no staple line bleeding.  The anastomosis was noted to be about 10cm from the anal verge.  The scope was removed and we switched to clean gowns, gloves, drapes and instruments.     The extraction port site was closed using a 0 Vicryl running suture.  The fascia was closed using 2 #1 Novafil sutures in a running fashion.  The subcuticular layer was closed using a running 2-0 Vicryl suture.  The skin of the incision and port sites were closed using 4-0 running subcuticular Vicryl sutures.  The patient was then awakened from anesthesia and sent to the PACU in stable condition.  All counts were correct per OR staff.  An MD assistant was necessary for tissue manipulation, retraction and positioning due to the complexity of the case and hospital policies  Bernarda JAYSON Ned, MD  Colorectal and General Surgery [facility]

## 2024-06-27 NOTE — Anesthesia Postprocedure Evaluation (Signed)
"   Anesthesia Post Note  Patient: Lauren [name]  Procedure(s) Performed: LOW ANTERIOR RESECTION, ROBOT-ASSISTED, LAPAROSCOPIC (Abdomen)     Patient location during evaluation: PACU Anesthesia Type: General Level of consciousness: awake and alert Pain management: pain level controlled Vital Signs Assessment: post-procedure vital signs reviewed and stable Respiratory status: spontaneous breathing, nonlabored ventilation, respiratory function stable and patient connected to nasal cannula oxygen Cardiovascular status: blood pressure returned to baseline and stable Postop Assessment: no apparent nausea or vomiting Anesthetic complications: no   There were no known notable events for this encounter.  Last Vitals:  Vitals:   06/27/24 1456 06/27/24 1602  BP: (!) 147/72 139/65  Pulse: 77 80  Resp: 16 18  Temp: (!) 36.4 C 36.6 C  SpO2: 100% 100%    Last Pain:  Vitals:   06/27/24 1602  TempSrc: Oral  PainSc:                  Garnette DELENA Gab      "

## 2024-06-27 NOTE — Transfer of Care (Signed)
 Immediate Anesthesia Transfer of Care Note  Patient: Lauren [name]  Procedure(s) Performed: LOW ANTERIOR RESECTION, ROBOT-ASSISTED, LAPAROSCOPIC (Abdomen)  Patient Location: PACU  Anesthesia Type:General  Level of Consciousness: awake, alert , and oriented  Airway & Oxygen Therapy: Patient Spontanous Breathing and Patient connected to face mask oxygen  Post-op Assessment: Report given to RN, Post -op Vital signs reviewed and stable, and Patient moving all extremities  Post vital signs: Reviewed and stable  Last Vitals:  Vitals Value Taken Time  BP 187/88 06/27/24 13:30  Temp    Pulse 91 06/27/24 13:32  Resp 22 06/27/24 13:32  SpO2 100 % 06/27/24 13:32  Vitals shown include unfiled device data.  Last Pain:  Vitals:   06/27/24 0839  TempSrc:   PainSc: 0-No pain         Complications: There were no known notable events for this encounter.

## 2024-08-28 ENCOUNTER — Ambulatory Visit (HOSPITAL_BASED_OUTPATIENT_CLINIC_OR_DEPARTMENT_OTHER)

## 2025-01-07 ENCOUNTER — Ambulatory Visit

## 2025-03-18 ENCOUNTER — Encounter: Admitting: Internal Medicine
